# Patient Record
Sex: Female | Born: 1945 | Race: Black or African American | Hispanic: No | Smoking: Never smoker
Health system: Southern US, Community
[De-identification: ages and names within clinical notes are randomized; demographics above are authoritative.]

## PROBLEM LIST (undated history)

## (undated) ENCOUNTER — Ambulatory Visit: Admission: EM | Payer: Medicare Other | Source: Home / Self Care

## (undated) ENCOUNTER — Emergency Department: Discharge status: Absent without leave | Payer: Medicare Other

## (undated) DIAGNOSIS — N950 Postmenopausal bleeding: Secondary | ICD-10-CM

## (undated) DIAGNOSIS — N3281 Overactive bladder: Secondary | ICD-10-CM

## (undated) DIAGNOSIS — E785 Hyperlipidemia, unspecified: Secondary | ICD-10-CM

## (undated) DIAGNOSIS — I639 Cerebral infarction, unspecified: Secondary | ICD-10-CM

## (undated) DIAGNOSIS — E119 Type 2 diabetes mellitus without complications: Secondary | ICD-10-CM

## (undated) DIAGNOSIS — R32 Unspecified urinary incontinence: Secondary | ICD-10-CM

## (undated) DIAGNOSIS — R030 Elevated blood-pressure reading, without diagnosis of hypertension: Secondary | ICD-10-CM

## (undated) DIAGNOSIS — M810 Age-related osteoporosis without current pathological fracture: Secondary | ICD-10-CM

## (undated) HISTORY — DX: Unspecified urinary incontinence: R32

## (undated) HISTORY — DX: Overactive bladder: N32.81

## (undated) HISTORY — DX: Age-related osteoporosis without current pathological fracture: M81.0

## (undated) HISTORY — DX: Hyperlipidemia, unspecified: E78.5

## (undated) HISTORY — DX: Type 2 diabetes mellitus without complications: E11.9

## (undated) HISTORY — DX: Elevated blood-pressure reading, without diagnosis of hypertension: R03.0

---
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
## 2004-08-24 ENCOUNTER — Ambulatory Visit: Payer: Self-pay | Admitting: Family Medicine

## 2005-07-02 ENCOUNTER — Emergency Department: Payer: Self-pay | Admitting: Emergency Medicine

## 2005-09-15 ENCOUNTER — Ambulatory Visit: Payer: Self-pay | Admitting: Psychiatry

## 2006-02-11 ENCOUNTER — Ambulatory Visit: Payer: Self-pay | Admitting: Family Medicine

## 2006-10-25 ENCOUNTER — Emergency Department: Payer: Self-pay | Admitting: Emergency Medicine

## 2007-04-14 ENCOUNTER — Ambulatory Visit: Payer: Self-pay | Admitting: Family Medicine

## 2007-05-23 LAB — HM COLONOSCOPY: HM Colonoscopy: NORMAL

## 2007-05-28 ENCOUNTER — Ambulatory Visit: Payer: Self-pay | Admitting: Gastroenterology

## 2008-04-19 ENCOUNTER — Ambulatory Visit: Payer: Self-pay | Admitting: Family Medicine

## 2008-05-21 HISTORY — PX: COLONOSCOPY: SHX174

## 2008-09-13 ENCOUNTER — Ambulatory Visit: Payer: Self-pay | Admitting: Family Medicine

## 2008-10-06 ENCOUNTER — Ambulatory Visit: Payer: Self-pay | Admitting: Family Medicine

## 2009-05-09 ENCOUNTER — Ambulatory Visit: Payer: Self-pay | Admitting: Family Medicine

## 2009-07-22 ENCOUNTER — Ambulatory Visit: Payer: Self-pay | Admitting: Family Medicine

## 2010-07-04 ENCOUNTER — Ambulatory Visit: Payer: Self-pay | Admitting: Internal Medicine

## 2011-01-17 ENCOUNTER — Ambulatory Visit: Payer: Self-pay | Admitting: Internal Medicine

## 2011-07-10 ENCOUNTER — Ambulatory Visit: Payer: Self-pay | Admitting: Internal Medicine

## 2012-02-06 ENCOUNTER — Ambulatory Visit: Payer: Self-pay | Admitting: Internal Medicine

## 2012-08-12 ENCOUNTER — Ambulatory Visit: Payer: Self-pay | Admitting: Internal Medicine

## 2013-02-28 ENCOUNTER — Ambulatory Visit: Payer: Self-pay | Admitting: Emergency Medicine

## 2013-03-11 DIAGNOSIS — I6990 Unspecified sequelae of unspecified cerebrovascular disease: Secondary | ICD-10-CM | POA: Insufficient documentation

## 2013-03-11 DIAGNOSIS — N3946 Mixed incontinence: Secondary | ICD-10-CM | POA: Insufficient documentation

## 2013-04-02 ENCOUNTER — Encounter: Payer: Self-pay | Admitting: Urology

## 2013-04-20 ENCOUNTER — Encounter: Payer: Self-pay | Admitting: Urology

## 2013-09-18 LAB — LIPID PANEL
Cholesterol: 230 mg/dL — AB (ref 0–200)
HDL: 69 mg/dL (ref 35–70)
LDL Cholesterol: 136 mg/dL
Triglycerides: 126 mg/dL (ref 40–160)

## 2013-09-18 LAB — BASIC METABOLIC PANEL
BUN: 12 mg/dL (ref 4–21)
Creatinine: 0.7 mg/dL (ref <=1.1)

## 2013-10-08 ENCOUNTER — Ambulatory Visit: Payer: Self-pay | Admitting: Internal Medicine

## 2013-10-08 LAB — HM MAMMOGRAPHY: HM Mammogram: NORMAL

## 2014-04-28 ENCOUNTER — Ambulatory Visit: Payer: Self-pay | Admitting: Family Medicine

## 2014-06-07 LAB — CBC AND DIFFERENTIAL: Hemoglobin: 13 g/dL (ref 12.0–16.0)

## 2014-06-07 LAB — TSH: TSH: 3.4 u[IU]/mL (ref <=5.90)

## 2014-12-15 ENCOUNTER — Encounter: Payer: Self-pay | Admitting: Internal Medicine

## 2014-12-15 DIAGNOSIS — R030 Elevated blood-pressure reading, without diagnosis of hypertension: Secondary | ICD-10-CM | POA: Insufficient documentation

## 2014-12-15 DIAGNOSIS — J302 Other seasonal allergic rhinitis: Secondary | ICD-10-CM | POA: Insufficient documentation

## 2014-12-15 DIAGNOSIS — I69354 Hemiplegia and hemiparesis following cerebral infarction affecting left non-dominant side: Secondary | ICD-10-CM | POA: Insufficient documentation

## 2014-12-15 DIAGNOSIS — M19019 Primary osteoarthritis, unspecified shoulder: Secondary | ICD-10-CM | POA: Insufficient documentation

## 2014-12-15 DIAGNOSIS — R32 Unspecified urinary incontinence: Secondary | ICD-10-CM | POA: Insufficient documentation

## 2014-12-15 DIAGNOSIS — G4733 Obstructive sleep apnea (adult) (pediatric): Secondary | ICD-10-CM | POA: Insufficient documentation

## 2014-12-15 DIAGNOSIS — M810 Age-related osteoporosis without current pathological fracture: Secondary | ICD-10-CM | POA: Insufficient documentation

## 2014-12-15 DIAGNOSIS — R198 Other specified symptoms and signs involving the digestive system and abdomen: Secondary | ICD-10-CM | POA: Insufficient documentation

## 2014-12-15 DIAGNOSIS — R269 Unspecified abnormalities of gait and mobility: Secondary | ICD-10-CM | POA: Insufficient documentation

## 2014-12-15 HISTORY — DX: Unspecified urinary incontinence: R32

## 2014-12-15 HISTORY — DX: Elevated blood-pressure reading, without diagnosis of hypertension: R03.0

## 2014-12-17 ENCOUNTER — Ambulatory Visit (INDEPENDENT_AMBULATORY_CARE_PROVIDER_SITE_OTHER): Payer: Medicare HMO | Admitting: Internal Medicine

## 2014-12-17 ENCOUNTER — Encounter: Payer: Self-pay | Admitting: Internal Medicine

## 2014-12-17 VITALS — BP 142/74 | HR 80 | Ht <= 58 in | Wt 169.2 lb

## 2014-12-17 DIAGNOSIS — J40 Bronchitis, not specified as acute or chronic: Secondary | ICD-10-CM | POA: Diagnosis not present

## 2014-12-17 MED ORDER — AMOXICILLIN-POT CLAVULANATE 875-125 MG PO TABS: 1.0000 | ORAL_TABLET | Freq: Two times a day (BID) | ORAL | Status: DC

## 2014-12-17 NOTE — Patient Instructions (Signed)

## 2014-12-17 NOTE — Progress Notes (Signed)
Date:  12/17/2014   Name:  Sharon York   DOB:  [birth year]   MRN:  [id number]   Chief Complaint: Cough Cough This is a new problem. The current episode started in the past 7 days. The problem has been unchanged. The cough is productive of purulent sputum. Pertinent negatives include no chills, fever, heartburn, hemoptysis, shortness of breath, weight loss or wheezing. Exacerbated by: activity and talking. She has tried OTC cough suppressant for the symptoms. The treatment provided mild relief.     Review of Systems:  Review of Systems  Constitutional: Negative for fever, chills and weight loss.  Respiratory: Positive for cough. Negative for hemoptysis, shortness of breath and wheezing.   Gastrointestinal: Negative for heartburn.    Patient Active Problem List   Diagnosis Date Noted  . Abnormal gait 12/15/2014  . Altered bowel function 12/15/2014  . Blood pressure elevated without history of HTN 12/15/2014  . History of cerebrovascular accident with residual deficit 12/15/2014  . Absence of bladder continence 12/15/2014  . Obstructive sleep apnea of adult 12/15/2014  . Arthritis of shoulder region, degenerative 12/15/2014  . OP (osteoporosis) 12/15/2014  . Allergic rhinitis, seasonal 12/15/2014  . Mixed incontinence 03/11/2013    Prior to Admission medications   Medication Sig Start Date End Date Taking? Authorizing Provider  tiZANidine (ZANAFLEX) 2 MG tablet Take 2 mg by mouth. 03/07/07  Yes Historical Provider, MD  alendronate (FOSAMAX) 70 MG tablet Take 1 tablet by mouth once a week. 05/20/14   Historical Provider, MD  aspirin 81 MG chewable tablet Chew 1 tablet by mouth daily.    Historical Provider, MD  baclofen (LIORESAL) 10 MG tablet Take 1 tablet by mouth 2 (two) times daily. 05/20/14   Historical Provider, MD  Calcium Carbonate-Vitamin D (CALCIUM 500 + D) 500-125 MG-UNIT TABS Take by mouth.    Historical Provider, MD  fluticasone (FLONASE) 50 MCG/ACT nasal spray Place 2  sprays into the nose daily. 03/02/13   Historical Provider, MD  mirabegron ER (MYRBETRIQ) 25 MG TB24 tablet Take 1 tablet by mouth daily.    Historical Provider, MD  Naproxen Sodium (ALEVE) 220 MG CAPS Take 1 capsule by mouth 2 (two) times daily as needed.    Historical Provider, MD  triamcinolone cream (KENALOG) 0.1 % TRIAMCINOLONE ACETONIDE, 0.1% (External Cream)  1 (one) application(s) application(s) two times daily for 30 days  Quantity: 30;  Refills: 1   Ordered :27-Mar-2012  Bari Edward M.D.;  Started 27-Mar-2012 Active Comments: apply bid to rash to reduce itching 03/27/12   Historical Provider, MD    No Known Allergies  No past surgical history on file.  History  Substance Use Topics  . Smoking status: Never Smoker   . Smokeless tobacco: Not on file  . Alcohol Use: No     Medication list has been reviewed and updated.  Physical Examination:  Physical Exam  Constitutional: She appears well-developed and well-nourished. No distress.  Neck: Normal range of motion. Neck supple. No thyromegaly present.  Cardiovascular: Normal rate, regular rhythm and normal heart sounds.   Pulmonary/Chest: Effort normal. No respiratory distress.  Bronchial breast sounds both upper lobes anteriorly  Skin: She is not diaphoretic.  Psychiatric: She has a normal mood and affect.    BP 142/74 mmHg  Pulse 80  Ht  (1.448 m)  Wt 169 lb 3.2 oz (76.749 kg)  BMI 36.60 kg/m2  SpO2 95%  Assessment and Plan: 1. Bronchitis Continue otc cough suppressants and lozenges as needed -  amoxicillin-clavulanate (AUGMENTIN) 875-125 MG per tablet; Take 1 tablet by mouth 2 (two) times daily.  Dispense: 20 tablet; Refill: 0   Bari Edward, MD [facility]  12/17/2014

## 2015-01-05 ENCOUNTER — Ambulatory Visit (INDEPENDENT_AMBULATORY_CARE_PROVIDER_SITE_OTHER): Payer: Medicare Other | Admitting: Family Medicine

## 2015-01-05 ENCOUNTER — Encounter: Payer: Self-pay | Admitting: Family Medicine

## 2015-01-05 VITALS — BP 120/80 | HR 80 | Ht <= 58 in | Wt 175.0 lb

## 2015-01-05 DIAGNOSIS — B372 Candidiasis of skin and nail: Secondary | ICD-10-CM | POA: Diagnosis not present

## 2015-01-05 MED ORDER — FLUCONAZOLE 150 MG PO TABS: 150.0000 mg | ORAL_TABLET | Freq: Once | ORAL | Status: DC

## 2015-01-05 NOTE — Progress Notes (Signed)
Name: Sharon York   MRN: [id number]    DOB: January 13, 1946   Date:01/05/2015       Progress Note  Subjective  Chief Complaint  Chief Complaint  Patient presents with  . Rash    under both breast but worse under the L) breast x 2 weeks- worse in past couple of days- sweating makes it worse    Rash This is a new problem. The current episode started 1 to 4 weeks ago. The problem has been gradually worsening since onset. The affected locations include the chest (beneath breast). The rash is characterized by itchiness, redness, swelling and pain. She was exposed to nothing. Associated symptoms include congestion, coughing and rhinorrhea. Pertinent negatives include no anorexia, diarrhea, eye pain, facial edema, fatigue, fever, joint pain, shortness of breath or sore throat. Past treatments include antibiotics and anti-itch cream. The treatment provided no relief.    No problem-specific assessment & plan notes found for this encounter.   Past Medical History  Diagnosis Date  . Osteoporosis   . Overactive bladder     Past Surgical History  Procedure Laterality Date  . Colonoscopy  2010    normal    Family History  Problem Relation Age of Onset  . Diabetes Mother     Social History   Social History  . Marital Status: Single    Spouse Name: N/A  . Number of Children: N/A  . Years of Education: N/A   Occupational History  . Not on file.   Social History Main Topics  . Smoking status: Never Smoker   . Smokeless tobacco: Not on file  . Alcohol Use: No  . Drug Use: No  . Sexual Activity: No   Other Topics Concern  . Not on file   Social History Narrative    No Known Allergies   Review of Systems  Constitutional: Negative for fever, chills, weight loss, malaise/fatigue and fatigue.  HENT: Positive for congestion and rhinorrhea. Negative for ear discharge, ear pain and sore throat.   Eyes: Negative for blurred vision and pain.  Respiratory: Positive for cough.  Negative for sputum production, shortness of breath and wheezing.   Cardiovascular: Negative for chest pain, palpitations and leg swelling.  Gastrointestinal: Negative for heartburn, nausea, abdominal pain, diarrhea, constipation, blood in stool, melena and anorexia.  Genitourinary: Negative for dysuria, urgency, frequency and hematuria.  Musculoskeletal: Negative for myalgias, back pain, joint pain and neck pain.  Skin: Positive for rash.  Neurological: Negative for dizziness, tingling, sensory change, focal weakness and headaches.  Endo/Heme/Allergies: Negative for environmental allergies and polydipsia. Does not bruise/bleed easily.  Psychiatric/Behavioral: Negative for depression and suicidal ideas. The patient is not nervous/anxious and does not have insomnia.      Objective  Filed Vitals:   01/05/15 1013  BP: 120/80  Pulse: 80  Height: 4\' 10"  (1.473 m)  Weight: 175 lb (79.379 kg)    Physical Exam  Constitutional: She is well-developed, well-nourished, and in no distress. No distress.  HENT:  Head: Normocephalic and atraumatic.  Right Ear: External ear normal.  Left Ear: External ear normal.  Nose: Nose normal.  Mouth/Throat: Oropharynx is clear and moist.  Eyes: Conjunctivae and EOM are normal. Pupils are equal, round, and reactive to light. Right eye exhibits no discharge. Left eye exhibits no discharge.  Neck: Normal range of motion. Neck supple.  Cardiovascular: Normal rate, regular rhythm, normal heart sounds and intact distal pulses.  Exam reveals no gallop and no friction rub.   No  murmur heard. Pulmonary/Chest: Effort normal and breath sounds normal.  Abdominal: Soft. Bowel sounds are normal. She exhibits no mass. There is no tenderness. There is no guarding.  Musculoskeletal: Normal range of motion.  Neurological: She is alert.  Skin: Skin is warm and dry. Rash noted. She is not diaphoretic. There is erythema.  With satellite/ contact  areas / no induration/  fissures      Assessment & Plan  Problem List Items Addressed This Visit    None    Visit Diagnoses    Candidiasis of skin    -  Primary    nystatin cream    Relevant Medications    fluconazole (DIFLUCAN) 150 MG tablet         Dr. Hayden [facility]  01/05/2015

## 2015-01-19 ENCOUNTER — Other Ambulatory Visit: Payer: Self-pay | Admitting: Family Medicine

## 2015-01-27 ENCOUNTER — Other Ambulatory Visit: Payer: Self-pay

## 2015-01-27 ENCOUNTER — Telehealth: Payer: Self-pay

## 2015-01-27 MED ORDER — NYSTATIN 100000 UNIT/GM EX CREA: 1.0000 "application " | TOPICAL_CREAM | Freq: Two times a day (BID) | CUTANEOUS | Status: DC

## 2015-01-27 NOTE — Telephone Encounter (Signed)
Done

## 2015-01-27 NOTE — Telephone Encounter (Signed)
Nystatin cream refilled per pt request

## 2015-01-27 NOTE — Addendum Note (Signed)
Addended by: Schuyler Amor on: 01/27/2015 04:06 PM   Modules accepted: Orders

## 2015-01-30 ENCOUNTER — Ambulatory Visit
Admission: EM | Admit: 2015-01-30 | Discharge: 2015-01-30 | Disposition: A | Discharge status: Home / Self Care | Payer: Medicare Other | Attending: Family Medicine | Admitting: Family Medicine

## 2015-01-30 ENCOUNTER — Encounter: Payer: Self-pay | Admitting: *Deleted

## 2015-01-30 DIAGNOSIS — N390 Urinary tract infection, site not specified: Secondary | ICD-10-CM | POA: Diagnosis not present

## 2015-01-30 HISTORY — DX: Cerebral infarction, unspecified: I63.9

## 2015-01-30 LAB — URINALYSIS COMPLETE WITH MICROSCOPIC (ARMC ONLY)
Bilirubin Urine: NEGATIVE
Glucose, UA: NEGATIVE mg/dL
Ketones, ur: NEGATIVE mg/dL
Nitrite: NEGATIVE
Protein, ur: NEGATIVE mg/dL
Specific Gravity, Urine: 1.025 (ref 1.005–1.030)
pH: 5.5 (ref 5.0–8.0)

## 2015-01-30 MED ORDER — NITROFURANTOIN MONOHYD MACRO 100 MG PO CAPS: 100.0000 mg | ORAL_CAPSULE | Freq: Two times a day (BID) | ORAL | Status: DC

## 2015-01-30 NOTE — Discharge Instructions (Signed)

## 2015-01-30 NOTE — ED Notes (Signed)
Pt states that she is having right sided abdominal pain, started last night

## 2015-02-01 LAB — URINE CULTURE: Special Requests: NORMAL

## 2015-02-01 NOTE — ED Provider Notes (Signed)
CSN: [id number]     Arrival date & time 01/30/15  1432 History   First MD Initiated Contact with Patient 01/30/15 1533     Chief Complaint  Patient presents with  . Abdominal Pain   (Consider location/radiation/quality/duration/timing/severity/associated sxs/prior Treatment) HPI Comments: Single african [place] female here with daughter for evaluation of right lower abdomen pain.  Was started on fiber therapy 2 months ago.  Daily diarrhea since that time  Stroke 1995 has right sided weakness/uses cane.  Colonoscopy with polyps a few years ago.  Rash under left breast using nystatin cream as prescribed by [facility] coworker and rash resolving.  Dizzyness in the mornings recurrent.  Patient is a 69 y.o. female presenting with abdominal pain. The history is provided by the patient and a relative.  Abdominal Pain Pain location:  RLQ Pain quality: aching   Pain quality: not bloating, not burning, not cramping, not dull, no fullness, not gnawing, not heavy, no pressure, not sharp, not shooting, not squeezing, not stabbing, no stiffness, not tearing, not throbbing and not tugging   Pain radiates to:  Does not radiate Pain severity:  Moderate Onset quality:  Sudden Duration:  1 day Timing:  Constant Progression:  Unchanged Chronicity:  New Context: diet changes and laxative use   Context: not alcohol use, not awakening from sleep, not eating, not medication withdrawal, not previous surgeries, not recent illness, not recent sexual activity, not recent travel, not retching, not sick contacts, not suspicious food intake and not trauma   Relieved by:  Nothing Worsened by:  Palpation Ineffective treatments:  Bowel activity, liquids, urination, position changes, movement, lying down, eating and not moving Associated symptoms: diarrhea   Associated symptoms: no anorexia, no belching, no chest pain, no chills, no constipation, no cough, no dysuria, no fatigue, no fever, no flatus, no hematemesis, no  hematochezia, no hematuria, no melena, no nausea, no shortness of breath, no sore throat, no vaginal bleeding, no vaginal discharge and no vomiting   Diarrhea:    Quality:  Watery   Number of occurrences:  Daily   Severity:  Moderate   Timing:  Intermittent   Progression:  Unchanged Risk factors: aspirin, being elderly and obesity   Risk factors: no alcohol abuse, has not had multiple surgeries, no NSAID use and not pregnant     Past Medical History  Diagnosis Date  . Osteoporosis   . Overactive bladder   . Stroke    Past Surgical History  Procedure Laterality Date  . Colonoscopy  2010    normal   Family History  Problem Relation Age of Onset  . Diabetes Mother    Social History  Substance Use Topics  . Smoking status: Never Smoker   . Smokeless tobacco: None  . Alcohol Use: No   OB History    No data available     Review of Systems  Constitutional: Negative for fever, chills, diaphoresis, activity change, appetite change and fatigue.  HENT: Negative for congestion, dental problem, drooling, ear discharge, ear pain, facial swelling, hearing loss, mouth sores, nosebleeds, postnasal drip, sore throat, trouble swallowing and voice change.   Eyes: Negative for photophobia, pain, discharge, redness, itching and visual disturbance.  Respiratory: Negative for cough, choking, chest tightness, shortness of breath, wheezing and stridor.   Cardiovascular: Negative for chest pain and leg swelling.  Gastrointestinal: Positive for abdominal pain and diarrhea. Negative for nausea, vomiting, constipation, blood in stool, melena, hematochezia, abdominal distention, anal bleeding, anorexia, flatus and hematemesis.  Endocrine: Negative for  cold intolerance and heat intolerance.  Genitourinary: Negative for dysuria, urgency, frequency, hematuria, decreased urine volume, vaginal bleeding and vaginal discharge.  Musculoskeletal: Negative for myalgias, back pain, joint swelling, arthralgias,  gait problem, neck pain and neck stiffness.  Skin: Positive for rash. Negative for color change, pallor and wound.  Allergic/Immunologic: Negative for environmental allergies and food allergies.  Neurological: Positive for dizziness. Negative for tremors, seizures, syncope, facial asymmetry, speech difficulty, weakness, light-headedness, numbness and headaches.  Hematological: Negative for adenopathy. Does not bruise/bleed easily.  Psychiatric/Behavioral: Negative for behavioral problems, confusion, sleep disturbance and agitation.    Allergies  Review of patient's allergies indicates no known allergies.  Home Medications   Prior to Admission medications   Medication Sig Start Date End Date Taking? Authorizing Provider  alendronate (FOSAMAX) 70 MG tablet Take 1 tablet by mouth once a week. 05/20/14  Yes Historical Provider, MD  aspirin 81 MG chewable tablet Chew 1 tablet by mouth daily.   Yes Historical Provider, MD  baclofen (LIORESAL) 10 MG tablet Take 1 tablet by mouth 2 (two) times daily. 05/20/14  Yes Historical Provider, MD  Calcium Carbonate-Vitamin D (CALCIUM 500 + D) 500-125 MG-UNIT TABS Take by mouth.   Yes Historical Provider, MD  fluconazole (DIFLUCAN) 150 MG tablet TAKE ONE TABLET BY MOUTH ONCE AS A ONE TIME DOSE 01/19/15  Yes Reubin Milan, MD  mirabegron ER (MYRBETRIQ) 25 MG TB24 tablet Take 1 tablet by mouth daily.   Yes Historical Provider, MD  nystatin cream (MYCOSTATIN) Apply 1 application topically 2 (two) times daily. 01/27/15  Yes Schuyler Amor, MD  nitrofurantoin, macrocrystal-monohydrate, (MACROBID) 100 MG capsule Take 1 capsule (100 mg total) by mouth 2 (two) times daily. 01/30/15   Barbaraann Barthel, NP   Meds Ordered and Administered this Visit  Medications - No data to display  BP 149/78 mmHg  Pulse 81  Temp(Src) 98.6 F (37 C) (Oral)  Ht 4\' 9"  (1.448 m)  Wt 164 lb (74.39 kg)  BMI 35.48 kg/m2  SpO2 97% No data found.   Physical Exam  Constitutional:  She is oriented to person, place, and time. Vital signs are normal. She appears well-developed and well-nourished. No distress.  HENT:  Head: Normocephalic and atraumatic.  Right Ear: External ear normal.  Left Ear: External ear normal.  Nose: Nose normal.  Mouth/Throat: Oropharynx is clear and moist. No oropharyngeal exudate.  Eyes: Conjunctivae, EOM and lids are normal. Pupils are equal, round, and reactive to light. Right eye exhibits no discharge. Left eye exhibits no discharge. No scleral icterus.  Neck: Trachea normal and normal range of motion. Neck supple. No tracheal deviation present.  Cardiovascular: Normal rate, regular rhythm, normal heart sounds and intact distal pulses.  Exam reveals no gallop and no friction rub.   No murmur heard. Pulmonary/Chest: Effort normal and breath sounds normal. No stridor. No respiratory distress. She has no wheezes. She has no rales.  Abdominal: Soft. Bowel sounds are normal. She exhibits no shifting dullness, no distension, no pulsatile liver, no fluid wave, no abdominal bruit, no ascites, no pulsatile midline mass and no mass. There is no hepatosplenomegaly. There is no tenderness. There is no rigidity, no rebound, no guarding, no CVA tenderness, no tenderness at McBurney's point and negative Murphy's sign. Hernia confirmed negative in the ventral area.  tympanny to percussion LUQ/RUQ and dull to percussion LLQ/RLQ  Musculoskeletal: Normal range of motion. She exhibits no edema or tenderness.  Left arm held across chest; c/o left leg weakness (chronic)uses  cane right hand and assist by daughter and provider to get on/off exam table  Lymphadenopathy:    She has no cervical adenopathy.  Neurological: She is alert and oriented to person, place, and time. She exhibits abnormal muscle tone. Coordination abnormal.  Skin: Skin is warm, dry and intact. She is not diaphoretic. No pallor.  Psychiatric: She has a normal mood and affect. Her speech is normal and  behavior is normal. Judgment and thought content normal. Cognition and memory are normal.  Nursing note and vitals reviewed.   ED Course  Procedures (including critical care time)  Labs Review Labs Reviewed  URINALYSIS COMPLETEWITH MICROSCOPIC ([facility] ONLY) - Abnormal; Notable for the following:    APPearance HAZY (*)    Hgb urine dipstick TRACE (*)    Leukocytes, UA 1+ (*)    Squamous Epithelial / LPF 6-30 (*)    All other components within normal limits  URINE CULTURE    Imaging Review No results found.   Discussed urinalysis results with patient and daughter.  Will call with culture results when available typically 48 hours. Patient given copy of urinalysis results.  Patient and daughter verbalized understanding of information/instructions, agreed with plan of care and had no further questions at this time.   MDM   1. UTI (lower urinary tract infection)    Medications as directed. macrobid  po BID    Patient is also to push fluids and may use Pyridium  po TID as needed.  Hydrate, avoid dehydration.  Avoid holding urine void on frequent basis every 4 to 6 hours.  If unable to void every 8 hours follow up for re-evaluation with PCM, urgent care or ER.   Call or return to clinic as needed if these symptoms worsen or fail to improve as anticipated.  Exitcare handout on cystitis given to patient Patient verbalized agreement and understanding of treatment plan and had no further questions at this time. P2:  Hydrate and cranberry juice  Diarrhea discussed titrating fiber supplement to have soft brown stool daily.  Too much fiber can cause diarrhea.  If still having diarrhea follow up with [facility] for re-evaluation.  Patient and daughter verbalized understanding of information/instructions, agreed with plan of care and had no further questions at this time.  01 Feb 2015 at 1218 telephone message left for patient to contact clinic to verify if symptoms resolved.  Urine culture with  multiple species.    Barbaraann Barthel, NP 02/01/15 1227  Barbaraann Barthel, NP 02/01/15 1229

## 2015-02-03 ENCOUNTER — Encounter: Payer: Self-pay | Admitting: Emergency Medicine

## 2015-02-03 ENCOUNTER — Ambulatory Visit
Admission: EM | Admit: 2015-02-03 | Discharge: 2015-02-03 | Disposition: A | Discharge status: Other Acute Inpatient Hospital | Payer: Medicare Other | Attending: Family Medicine | Admitting: Family Medicine

## 2015-02-03 ENCOUNTER — Emergency Department
Admission: EM | Admit: 2015-02-03 | Discharge: 2015-02-03 | Disposition: A | Discharge status: Home / Self Care | Payer: Medicare Other | Attending: Emergency Medicine | Admitting: Emergency Medicine

## 2015-02-03 ENCOUNTER — Emergency Department: Payer: Medicare Other

## 2015-02-03 DIAGNOSIS — N12 Tubulo-interstitial nephritis, not specified as acute or chronic: Secondary | ICD-10-CM | POA: Diagnosis not present

## 2015-02-03 DIAGNOSIS — R51 Headache: Secondary | ICD-10-CM

## 2015-02-03 DIAGNOSIS — R519 Headache, unspecified: Secondary | ICD-10-CM

## 2015-02-03 DIAGNOSIS — Z79899 Other long term (current) drug therapy: Secondary | ICD-10-CM | POA: Insufficient documentation

## 2015-02-03 DIAGNOSIS — Z7982 Long term (current) use of aspirin: Secondary | ICD-10-CM | POA: Insufficient documentation

## 2015-02-03 DIAGNOSIS — G44019 Episodic cluster headache, not intractable: Secondary | ICD-10-CM | POA: Insufficient documentation

## 2015-02-03 DIAGNOSIS — M6281 Muscle weakness (generalized): Secondary | ICD-10-CM | POA: Insufficient documentation

## 2015-02-03 DIAGNOSIS — R531 Weakness: Secondary | ICD-10-CM | POA: Diagnosis not present

## 2015-02-03 LAB — CBC WITH DIFFERENTIAL/PLATELET
Basophils Absolute: 0 10*3/uL (ref 0–0.1)
Basophils Relative: 0 %
Eosinophils Absolute: 0.1 10*3/uL (ref 0–0.7)
Eosinophils Relative: 2 %
HCT: 40 % (ref 35.0–47.0)
Hemoglobin: 12.9 g/dL (ref 12.0–16.0)
Lymphocytes Relative: 34 %
Lymphs Abs: 2.4 10*3/uL (ref 1.0–3.6)
MCH: 22.9 pg — ABNORMAL LOW (ref 26.0–34.0)
MCHC: 32.2 g/dL (ref 32.0–36.0)
MCV: 71.1 fL — ABNORMAL LOW (ref 80.0–100.0)
Monocytes Absolute: 0.8 10*3/uL (ref 0.2–0.9)
Monocytes Relative: 11 %
Neutro Abs: 3.8 10*3/uL (ref 1.4–6.5)
Neutrophils Relative %: 53 %
Platelets: 233 10*3/uL (ref 150–440)
RBC: 5.63 MIL/uL — ABNORMAL HIGH (ref 3.80–5.20)
RDW: 15.6 % — ABNORMAL HIGH (ref 11.5–14.5)
WBC: 7 10*3/uL (ref 3.6–11.0)

## 2015-02-03 LAB — URINALYSIS COMPLETE WITH MICROSCOPIC (ARMC ONLY)
Bacteria, UA: NONE SEEN
Bilirubin Urine: NEGATIVE
Bilirubin Urine: NEGATIVE
Glucose, UA: NEGATIVE mg/dL
Glucose, UA: NEGATIVE mg/dL
Ketones, ur: NEGATIVE mg/dL
Ketones, ur: NEGATIVE mg/dL
Leukocytes, UA: NEGATIVE
Nitrite: NEGATIVE
Nitrite: NEGATIVE
Protein, ur: NEGATIVE mg/dL
Protein, ur: NEGATIVE mg/dL
Specific Gravity, Urine: 1.02 (ref 1.005–1.030)
Specific Gravity, Urine: 1.008 (ref 1.005–1.030)
pH: 7 (ref 5.0–8.0)
pH: 7 (ref 5.0–8.0)

## 2015-02-03 LAB — TROPONIN I: Troponin I: <0.03 ng/mL (ref <0.031)

## 2015-02-03 LAB — COMPREHENSIVE METABOLIC PANEL
ALT: 20 U/L (ref 14–54)
AST: 24 U/L (ref 15–41)
Albumin: 4.1 g/dL (ref 3.5–5.0)
Alkaline Phosphatase: 43 U/L (ref 38–126)
Anion gap: 11 (ref 5–15)
BUN: 14 mg/dL (ref 6–20)
CO2: 28 mmol/L (ref 22–32)
Calcium: 9.7 mg/dL (ref 8.9–10.3)
Chloride: 100 mmol/L — ABNORMAL LOW (ref 101–111)
Creatinine, Ser: 0.63 mg/dL (ref 0.44–1.00)
GFR calc Af Amer: >60 mL/min (ref >60)
GFR calc non Af Amer: >60 mL/min (ref >60)
Glucose, Bld: 109 mg/dL — ABNORMAL HIGH (ref 65–99)
Potassium: 3.9 mmol/L (ref 3.5–5.1)
Sodium: 139 mmol/L (ref 135–145)
Total Bilirubin: 0.4 mg/dL (ref 0.3–1.2)
Total Protein: 8 g/dL (ref 6.5–8.1)

## 2015-02-03 NOTE — ED Notes (Signed)
Patient transported to CT 

## 2015-02-03 NOTE — Discharge Instructions (Signed)
As discussed go directly to the ER of your choice. Your daughter is to drive you. Follow-up closely with your primary care physician within one week. Return to the urgent care as needed.  Headaches, Frequently Asked Questions MIGRAINE HEADACHES Q: What is migraine? What causes it? How can I treat it? A: Generally, migraine headaches begin as a dull ache. Then they develop into a constant, throbbing, and pulsating pain. You may experience pain at the temples. You may experience pain at the front or back of one or both sides of the head. The pain is usually accompanied by a combination of:  Nausea.  Vomiting.  Sensitivity to light and noise. Some people (about 15%) experience an aura (see below) before an attack. The cause of migraine is believed to be chemical reactions in the brain. Treatment for migraine may include over-the-counter or prescription medications. It may also include self-help techniques. These include relaxation training and biofeedback.  Q: What is an aura? A: About 15% of people with migraine get an "aura". This is a sign of neurological symptoms that occur before a migraine headache. You may see wavy or jagged lines, dots, or flashing lights. You might experience tunnel vision or blind spots in one or both eyes. The aura can include visual or auditory hallucinations (something imagined). It may include disruptions in smell (such as strange odors), taste or touch. Other symptoms include:  Numbness.  A "pins and needles" sensation.  Difficulty in recalling or speaking the correct word. These neurological events may last as long as 60 minutes. These symptoms will fade as the headache begins. Q: What is a trigger? A: Certain physical or environmental factors can lead to or "trigger" a migraine. These include:  Foods.  Hormonal changes.  Weather.  Stress. It is important to remember that triggers are different for everyone. To help prevent migraine attacks, you need to  figure out which triggers affect you. Keep a headache diary. This is a good way to track triggers. The diary will help you talk to your healthcare professional about your condition. Q: Does weather affect migraines? A: Bright sunshine, hot, humid conditions, and drastic changes in barometric pressure may lead to, or "trigger," a migraine attack in some people. But studies have shown that weather does not act as a trigger for everyone with migraines. Q: What is the link between migraine and hormones? A: Hormones start and regulate many of your body's functions. Hormones keep your body in balance within a constantly changing environment. The levels of hormones in your body are unbalanced at times. Examples are during menstruation, pregnancy, or menopause. That can lead to a migraine attack. In fact, about three quarters of all women with migraine report that their attacks are related to the menstrual cycle.  Q: Is there an increased risk of stroke for migraine sufferers? A: The likelihood of a migraine attack causing a stroke is very remote. That is not to say that migraine sufferers cannot have a stroke associated with their migraines. In persons under age 29, the most common associated factor for stroke is migraine headache. But over the course of a person's normal life span, the occurrence of migraine headache may actually be associated with a reduced risk of dying from cerebrovascular disease due to stroke.  Q: What are acute medications for migraine? A: Acute medications are used to treat the pain of the headache after it has started. Examples over-the-counter medications, NSAIDs, ergots, and triptans.  Q: What are the triptans? A: Triptans are  the newest class of abortive medications. They are specifically targeted to treat migraine. Triptans are vasoconstrictors. They moderate some chemical reactions in the brain. The triptans work on receptors in your brain. Triptans help to restore the balance of a  neurotransmitter called serotonin. Fluctuations in levels of serotonin are thought to be a main cause of migraine.  Q: Are over-the-counter medications for migraine effective? A: Over-the-counter, or "OTC," medications may be effective in relieving mild to moderate pain and associated symptoms of migraine. But you should see your caregiver before beginning any treatment regimen for migraine.  Q: What are preventive medications for migraine? A: Preventive medications for migraine are sometimes referred to as "prophylactic" treatments. They are used to reduce the frequency, severity, and length of migraine attacks. Examples of preventive medications include antiepileptic medications, antidepressants, beta-blockers, calcium channel blockers, and NSAIDs (nonsteroidal anti-inflammatory drugs). Q: Why are anticonvulsants used to treat migraine? A: During the past few years, there has been an increased interest in antiepileptic drugs for the prevention of migraine. They are sometimes referred to as "anticonvulsants". Both epilepsy and migraine may be caused by similar reactions in the brain.  Q: Why are antidepressants used to treat migraine? A: Antidepressants are typically used to treat people with depression. They may reduce migraine frequency by regulating chemical levels, such as serotonin, in the brain.  Q: What alternative therapies are used to treat migraine? A: The term "alternative therapies" is often used to describe treatments considered outside the scope of conventional Western medicine. Examples of alternative therapy include acupuncture, acupressure, and yoga. Another common alternative treatment is herbal therapy. Some herbs are believed to relieve headache pain. Always discuss alternative therapies with your caregiver before proceeding. Some herbal products contain arsenic and other toxins. TENSION HEADACHES Q: What is a tension-type headache? What causes it? How can I treat it? A:  Tension-type headaches occur randomly. They are often the result of temporary stress, anxiety, fatigue, or anger. Symptoms include soreness in your temples, a tightening band-like sensation around your head (a "vice-like" ache). Symptoms can also include a pulling feeling, pressure sensations, and contracting head and neck muscles. The headache begins in your forehead, temples, or the back of your head and neck. Treatment for tension-type headache may include over-the-counter or prescription medications. Treatment may also include self-help techniques such as relaxation training and biofeedback. CLUSTER HEADACHES Q: What is a cluster headache? What causes it? How can I treat it? A: Cluster headache gets its name because the attacks come in groups. The pain arrives with little, if any, warning. It is usually on one side of the head. A tearing or bloodshot eye and a runny nose on the same side of the headache may also accompany the pain. Cluster headaches are believed to be caused by chemical reactions in the brain. They have been described as the most severe and intense of any headache type. Treatment for cluster headache includes prescription medication and oxygen. SINUS HEADACHES Q: What is a sinus headache? What causes it? How can I treat it? A: When a cavity in the bones of the face and skull (a sinus) becomes inflamed, the inflammation will cause localized pain. This condition is usually the result of an allergic reaction, a tumor, or an infection. If your headache is caused by a sinus blockage, such as an infection, you will probably have a fever. An x-ray will confirm a sinus blockage. Your caregiver's treatment might include antibiotics for the infection, as well as antihistamines or decongestants.  REBOUND  HEADACHES Q: What is a rebound headache? What causes it? How can I treat it? A: A pattern of taking acute headache medications too often can lead to a condition known as "rebound headache." A  pattern of taking too much headache medication includes taking it more than 2 days per week or in excessive amounts. That means more than the label or a caregiver advises. With rebound headaches, your medications not only stop relieving pain, they actually begin to cause headaches. Doctors treat rebound headache by tapering the medication that is being overused. Sometimes your caregiver will gradually substitute a different type of treatment or medication. Stopping may be a challenge. Regularly overusing a medication increases the potential for serious side effects. Consult a caregiver if you regularly use headache medications more than 2 days per week or more than the label advises. ADDITIONAL QUESTIONS AND ANSWERS Q: What is biofeedback? A: Biofeedback is a self-help treatment. Biofeedback uses special equipment to monitor your body's involuntary physical responses. Biofeedback monitors:  Breathing.  Pulse.  Heart rate.  Temperature.  Muscle tension.  Brain activity. Biofeedback helps you refine and perfect your relaxation exercises. You learn to control the physical responses that are related to stress. Once the technique has been mastered, you do not need the equipment any more. Q: Are headaches hereditary? A: Four out of five (80%) of people that suffer report a family history of migraine. Scientists are not sure if this is genetic or a family predisposition. Despite the uncertainty, a child has a 50% chance of having migraine if one parent suffers. The child has a 75% chance if both parents suffer.  Q: Can children get headaches? A: By the time they reach high school, most young people have experienced some type of headache. Many safe and effective approaches or medications can prevent a headache from occurring or stop it after it has begun.  Q: What type of doctor should I see to diagnose and treat my headache? A: Start with your primary caregiver. Discuss his or her experience and  approach to headaches. Discuss methods of classification, diagnosis, and treatment. Your caregiver may decide to recommend you to a headache specialist, depending upon your symptoms or other physical conditions. Having diabetes, allergies, etc., may require a more comprehensive and inclusive approach to your headache. The National Headache Foundation will provide, upon request, a list of [facility] physician members in your state. Document Released: 07/28/2003 Document Revised: 07/30/2011 Document Reviewed: 01/05/2008 [facility] Patient Information 2015 [place], [place]. This information is not intended to replace advice given to you by your health care provider. Make sure you discuss any questions you have with your health care provider.  Weakness Weakness is a lack of strength. You may feel weak all over your body or just in one part of your body. Weakness can be serious. In some cases, you may need more medical tests. HOME CARE  Rest.  Eat a well-balanced diet.  Try to exercise every day.  Only take medicines as told by your doctor. GET HELP RIGHT AWAY IF:   You cannot do your normal daily activities.  You cannot walk up and down stairs, or you feel very tired when you do so.  You have shortness of breath or chest pain.  You have trouble moving parts of your body.  You have weakness in only one body part or on only one side of the body.  You have a fever.  You have trouble speaking or swallowing.  You cannot control when you  pee (urinate) or poop (bowel movement).  You have black or bloody throw up (vomit) or poop.  Your weakness gets worse or spreads to other body parts.  You have new aches or pains. MAKE SURE YOU:   Understand these instructions.  Will watch your condition.  Will get help right away if you are not doing well or get worse. Document Released: 04/19/2008 Document Revised: 11/06/2011 Document Reviewed: 07/06/2011 [facility] Patient Information 2015 [place], [place].  This information is not intended to replace advice given to you by your health care provider. Make sure you discuss any questions you have with your health care provider.

## 2015-02-03 NOTE — ED Notes (Signed)
Attempted to in and out catheterize pt, with no urine return. MD notified, pt given water, will reassess.

## 2015-02-03 NOTE — Discharge Instructions (Signed)
Please complete the entire course of antibiotics, even if you are feeling better.  Please have your primary care physician follow up the results of your urine culture today.  Drink plenty of fluid and get plenty of rest.  Return to the emergency department if you develop severe pain, headache, fever, new numbness, tingling or weakness, or for any other symptoms concerning to you.

## 2015-02-03 NOTE — ED Provider Notes (Signed)
[facility] Emergency Department Provider Note  ____________________________________________  Time seen: Approximately 1150 PM  I have reviewed the triage vital signs and the nursing notes.   HISTORY  Chief Complaint Headache   HPI Sharon York is a 69 y.o. female presents with a complaint of intermittent headache. States that these intermittent headaches have been present 3 days. States they are usually in the morning. Denies fall or injury. Denies current headache now.Denies known triggers of headache. Reports continues to eat and drink well. Report ate breakfast this am.   Reports when headaches are present they are at 6 out of 10 and "pain all over the back of my head". Denies vision changes, dizziness, neck or back pain. Denies fevers. Denies chest pain, shortness of breath, abdominal pain, dysuria, or weakness. Reports occasional right side pain that is mostly with movement and has been present x several days. Also reports some intermittent right knee weakness feeling. Denies generalized right leg weakness, but states only in knee over last several days. Denies knee pain.   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 Denies changes in deficits.   Patient reports seen in Urgent care 2 days ago and diagnosed with UTI. Reports started on macrobid. But reports headaches present prior to starting medication.    Past Medical History  Diagnosis Date  . Osteoporosis   . Overactive bladder   . Stroke     Patient Active Problem List   Diagnosis Date Noted  . Abnormal gait 12/15/2014  . Altered bowel function 12/15/2014  . Blood pressure elevated without history of HTN 12/15/2014  . History of cerebrovascular accident with residual deficit 12/15/2014  . Absence of bladder continence 12/15/2014  . Obstructive sleep apnea of adult 12/15/2014  . Arthritis of shoulder region, degenerative 12/15/2014  . OP (osteoporosis) 12/15/2014  .  Allergic rhinitis, seasonal 12/15/2014  . Mixed incontinence 03/11/2013    Past Surgical History  Procedure Laterality Date  . Colonoscopy  2010    normal    Current Outpatient Rx  Name  Route  Sig  Dispense  Refill  . alendronate (FOSAMAX) 70 MG tablet   Oral   Take 1 tablet by mouth once a week.         Marland Kitchen aspirin 81 MG chewable tablet   Oral   Chew 1 tablet by mouth daily.         . baclofen (LIORESAL) 10 MG tablet   Oral   Take 1 tablet by mouth 2 (two) times daily.         . Calcium Carbonate-Vitamin D (CALCIUM 500 + D) 500-125 MG-UNIT TABS   Oral   Take by mouth.         . fluconazole (DIFLUCAN) 150 MG tablet      TAKE ONE TABLET BY MOUTH ONCE AS A ONE TIME DOSE   1 tablet   0   . mirabegron ER (MYRBETRIQ) 25 MG TB24 tablet   Oral   Take 1 tablet by mouth daily.         . nitrofurantoin, macrocrystal-monohydrate, (MACROBID) 100 MG capsule   Oral   Take 1 capsule (100 mg total) by mouth 2 (two) times daily.   14 capsule   0   . nystatin cream (MYCOSTATIN)   Topical   Apply 1 application topically 2 (two) times daily.   30 g   2     Allergies Review of patient's allergies indicates no known allergies.  Family History  Problem Relation Age of Onset  . Diabetes Mother     Social History Social History  Substance Use Topics  . Smoking status: Never Smoker   . Smokeless tobacco: None  . Alcohol Use: No    Review of Systems Constitutional: No fever/chills Eyes: No visual changes. ENT: No sore throat. Cardiovascular: Denies chest pain. Respiratory: Denies shortness of breath. Gastrointestinal: No abdominal pain.  No nausea, no vomiting.  No diarrhea.  No constipation. Genitourinary: Negative for dysuria. Musculoskeletal: Negative for back pain. Skin: Negative for rash. Neurological: positive for headaches. Denies numbness or loss of sensation.   10-point ROS otherwise  negative.  ____________________________________________   PHYSICAL EXAM:  VITAL SIGNS: ED Triage Vitals  Enc Vitals Group     BP 02/03/15 1100 158/72 mmHg     Pulse Rate 02/03/15 1100 78     Resp 02/03/15 1100 16     Temp 02/03/15 1100 96.2 F (35.7 C)     Temp Source 02/03/15 1100 Tympanic     SpO2 02/03/15 1100 98 %     Weight 02/03/15 1100 164 lb (74.39 kg)     Height 02/03/15 1100 4\' 11"  (1.499 m)     Head Cir --      Peak Flow --      Pain Score --      Pain Loc --      Pain Edu? --      Excl. in GC? --     Constitutional: Alert and oriented. Well appearing and in no acute distress. Eyes: Conjunctivae are normal. PERRL. EOMI. Head: Atraumatic. Nontender to palpation.    Nose: No congestion/rhinnorhea.  Mouth/Throat: Mucous membranes are moist.  Oropharynx non-erythematous.  Neck: No stridor.  No cervical spine tenderness to palpation. Hematological/Lymphatic/Immunilogical: No cervical lymphadenopathy. Cardiovascular: Normal rate, regular rhythm. Grossly normal heart sounds.  Good peripheral circulation. Respiratory: Normal respiratory effort.  No retractions. Lungs CTAB. Gastrointestinal: Soft and nontender. No distention. Normal Bowel sounds.  No abdominal bruits. No CVA tenderness. Musculoskeletal: No lower or upper extremity tenderness nor edema.  No joint effusions. Bilateral pedal pulses equal and easily palpated. No cervical, thoracic or lumbar TTP.  Neurologic:  Normal speech and language. Left arm flexed at elbow and held closely to body, unable to full extend elbow and unable to fully abduct left shoulder, weakened left hand grip. Decreased sensation to left face, left arm and left leg.4/5 strength to left leg.  Slight ataxic gait. Slight left facial droop. Per patient these findings are chronic and residual from prior CVA.  No noted right face right arm or right leg weakness or sensation deficits. Ambulatory with cane. No right pronator drift. 5/5 strength to  right arm and right leg.  Skin:  Skin is warm, dry and intact. No rash noted. Psychiatric: Mood and affect are normal. Speech and behavior are normal.  ____________________________________________   LABS (all labs ordered are listed, but only abnormal results are displayed)  Labs Reviewed  CBC WITH DIFFERENTIAL/PLATELET - Abnormal; Notable for the following:    RBC 5.63 (*)    MCV 71.1 (*)    MCH 22.9 (*)    RDW 15.6 (*)    All other components within normal limits  COMPREHENSIVE METABOLIC PANEL - Abnormal; Notable for the following:    Chloride 100 (*)    Glucose, Bld 109 (*)    All other components within normal limits  URINALYSIS COMPLETEWITH MICROSCOPIC ([facility] ONLY) - Abnormal; Notable for the following:  APPearance HAZY (*)    Hgb urine dipstick TRACE (*)    Leukocytes, UA 1+ (*)    Squamous Epithelial / LPF 0-5 (*)    All other components within normal limits  URINE CULTURE   ____________________________________________  EKG  Interpreted by Dr Thurmond Butts.  66bpm sinus rhythm with first degree AV block   Unable to find recent EKG for comparison. EKG from 08/30/1998 NORMAL SINUS RHYTHM WITH PROLONGED PR INTERVAL LEFT ANTERIOR FASCICULAR BLOCK.   Patient denies known history of abnormal EKG.   INITIAL IMPRESSION / ASSESSMENT AND PLAN / ED COURSE  Pertinent labs & imaging results that were available during my care of the patient were reviewed by me and considered in my medical decision making (see chart for details).  Patient reports that she drove herself to the urgent care today for evaluation of intermittent headaches. Patient reports that headaches have been present for the last 3 days. States that they have present upon awakening in the morning and throughout the morning. States that this is atypical for her she normally does not have headaches. Patient reports that the pain is usually in the back of her head and "all over". States that the pain tends to last for a few  seconds to a few minutes, goes away, and then may return. States pain is better when she gets up and going per patient. states that she is unsure if this is because she "just forgets about the headache".   1225: Patient daughter now at bedside. Patient daughter also reports noticing that her mother has seeming to be weak at home. States that she appears overall weak especially when walking over the last 2-3 days stating same timeframe that she has had these headaches. Daughter also reports that she has seemed more distracted than normal and gives the example that she would talk to her mother and her mother doesn't respond promptly as she normally would. Daughter states that her mother just does not seem right. Now with daughter at bedside patient also disclosed discloses that she feels weak and states that is not normal for her. Patient also states that she has been having some intermittent weakness to her right lower leg  and states that this feels like it's more coming from her knee.Patient now states she also is "just weak all over" and "does not feel right."  Patient denies fall or head injury. Denies fever. Denies chest pain, shortness of breath, abdominal pain, palpitations. Patient does report some intermittent right lower back pain as above. Discussed patient and plan of care with Dr Thurmond Butts.   As patient with report of atypical headaches and now with report of accompanying weakness during same time frame, discussed with patient and daughter will refer to ER for choice for further evaluation including CT of head, as unable to CT at this facility at this time. Also discussed, need for evaluation of weakness including cardiac evaluation. Recently seen and diagnosed with UTI 2 days ago, culture and urinalysis reviewed. Culture showing multiple species present and suggested recollection. Urine today very similar to urine 2 days ago, concerned for possible unclean sample.   1235: Patient and her daughter  discussed and prefers to go to [facility] ER for further evaluation. Patient and daughter reports wants to transport via their vehicle. Alert and oriented with decisional capacity and patient plans to have daughter transport her to the Emergency Room and verbalizes risks and benefits of private transport. Discussed follow up and return parameters including no resolution  or any worsening concerns. Patient verbalized understanding and agreed to plan.     ____________________________________________   FINAL CLINICAL IMPRESSION(S) / ED DIAGNOSES  Final diagnoses:  Nonintractable headache, unspecified chronicity pattern, unspecified headache type  Weakness       Renford Dills, NP 02/03/15 1413

## 2015-02-03 NOTE — ED Notes (Addendum)
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 Per notes from [facility] pt c/o HA, denies pain in the ED at this time.

## 2015-02-03 NOTE — ED Notes (Signed)
Patient c/o headache that starts in the morning when she wakes up for the past 2 days.

## 2015-02-03 NOTE — ED Provider Notes (Addendum)
[facility] Emergency Department Provider Note  ____________________________________________  Time seen: Approximately 5:44 PM  I have reviewed the triage vital signs and the nursing notes.   HISTORY  Chief Complaint Weakness    HPI Sharon York is a 69 y.o. 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 Patient reports that 5 days ago she was seen and initiated on antibiotics for UTI. Today she had increased weakness and felt like it was difficult to get out of bed. In addition, she describes pain on the posterior aspect of the right knee, which sometimes makes her right lower extremity feel weak. She has also had mild intermittent left-sided dull headache, with associated nausea but no vomiting. No fevers, chills. No abdominal pain. She did have a right flank pain which has improved since the initiation of antibiotics.   Past Medical History  Diagnosis Date  . Osteoporosis   . Overactive bladder   . Stroke     Patient Active Problem List   Diagnosis Date Noted  . Abnormal gait 12/15/2014  . Altered bowel function 12/15/2014  . Blood pressure elevated without history of HTN 12/15/2014  . History of cerebrovascular accident with residual deficit 12/15/2014  . Absence of bladder continence 12/15/2014  . Obstructive sleep apnea of adult 12/15/2014  . Arthritis of shoulder region, degenerative 12/15/2014  . OP (osteoporosis) 12/15/2014  . Allergic rhinitis, seasonal 12/15/2014  . Mixed incontinence 03/11/2013    Past Surgical History  Procedure Laterality Date  . Colonoscopy  2010    normal    Current Outpatient Rx  Name  Route  Sig  Dispense  Refill  . alendronate (FOSAMAX) 70 MG tablet   Oral   Take 1 tablet by mouth once a week.         Marland Kitchen aspirin 81 MG chewable tablet    Oral   Chew 1 tablet by mouth daily.         . baclofen (LIORESAL) 10 MG tablet   Oral   Take 1 tablet by mouth 2 (two) times daily.         . Calcium Carbonate-Vitamin D (CALCIUM 500 + D) 500-125 MG-UNIT TABS   Oral   Take by mouth.         . Methylcellulose, Laxative, (FIBER THERAPY PO)   Oral   Take 2 capsules by mouth daily.         . mirabegron ER (MYRBETRIQ) 25 MG TB24 tablet   Oral   Take 1 tablet by mouth daily.         . nitrofurantoin, macrocrystal-monohydrate, (MACROBID) 100 MG capsule   Oral   Take 1 capsule (100 mg total) by mouth 2 (two) times daily.   14 capsule   0   . nystatin cream (MYCOSTATIN)   Topical   Apply 1 application topically 2 (two) times daily.   30 g   2     Allergies Review of patient's allergies indicates no known allergies.  Family History  Problem Relation Age of Onset  . Diabetes Mother     Social History Social History  Substance Use Topics  . Smoking status: Never Smoker   . Smokeless tobacco: None  . Alcohol Use: No    Review of Systems Constitutional: No fever/chills Eyes: No visual changes. ENT: No sore throat. Cardiovascular: Denies chest pain, palpitations. Respiratory: Denies  shortness of breath.  No cough. Gastrointestinal: No abdominal pain.  No nausea, no vomiting.  No diarrhea.  No constipation. Improving right flank pain. Genitourinary: Negative for dysuria. Musculoskeletal: Negative for back pain. Skin: Negative for rash. Neurological: Mild left-sided headache. No numbness or tingling. No changes in vision or speech.  10-point ROS otherwise negative.  ____________________________________________   PHYSICAL EXAM:  VITAL SIGNS: ED Triage Vitals  Enc Vitals Group     BP 02/03/15 1343 156/72 mmHg     Pulse Rate 02/03/15 1343 74     Resp 02/03/15 1343 17     Temp 02/03/15 1343 97.5 F (36.4 C)     Temp Source 02/03/15 1343 Oral     SpO2 02/03/15 1343 96 %     Weight 02/03/15 1343 164  lb (74.39 kg)     Height 02/03/15 1343 4\' 9"  (1.448 m)     Head Cir --      Peak Flow --      Pain Score 02/03/15 1344 0     Pain Loc --      Pain Edu? --      Excl. in GC? --     Constitutional: Alert and oriented. Well appearing and in no acute distress. Answers questions appropriately. Eyes: Conjunctivae are normal.  EOMI. Head: Atraumatic. Nose: No congestion/rhinnorhea. Mouth/Throat: Mucous membranes are moist.  Neck: No stridor.  Supple.   Cardiovascular: Normal rate, regular rhythm. No murmurs, rubs or gallops.  Respiratory: Normal respiratory effort.  No retractions. Lungs CTAB.  No wheezes, rales or ronchi. Gastrointestinal: Obese. Soft and nontender. No distention. No peritoneal signs. Musculoskeletal: No LE edema. No palpable cords, no reproducible pain behind the right knee.  Normal right DP and PT pulse. Cap refill is less than 2 seconds.  Neurologic:  Alert and oriented 3, speech is clear. Left facial droop. Extraocular movements are intact. Pupils are symmetric, with left pupil slightly sluggish. Left almost complete hemiparesis of the upper extremity, 4/5 motor strength in the left hip flexor, plantar flexion and dorsiflexion. 5 out of 5 right hip flexion, plantar flexion and dorsiflexion. 5 out of 5 right grip strength biceps and triceps. No pronator drift on the right. Skin:  Skin is warm, dry and intact. No rash noted. Psychiatric: Mood and affect are normal. Speech and behavior are normal.  Normal judgement.  ____________________________________________   LABS (all labs ordered are listed, but only abnormal results are displayed)  Labs Reviewed  URINALYSIS COMPLETEWITH MICROSCOPIC ([facility] ONLY) - Abnormal; Notable for the following:    Color, Urine STRAW (*)    APPearance CLEAR (*)    Hgb urine dipstick 1+ (*)    Squamous Epithelial / LPF 0-5 (*)    All other components within normal limits  URINE CULTURE  TROPONIN I    ____________________________________________  EKG  ED ECG REPORT I, Rockne Menghini, the attending physician, personally viewed and interpreted this ECG.   Date: 02/03/2015    Rate: **66*  Rhythm: normal EKG, normal sinus rhythm, unchanged from previous tracings  Axis: Leftward axis  Intervals:first-degree A-V block   ST&T Change: No acute ST changes.  ____________________________________________  RADIOLOGY  Ct Head Wo Contrast  02/03/2015   CLINICAL DATA:  Headache, nausea and generalized weakness. History of prior stroke.  EXAM: CT HEAD WITHOUT CONTRAST  TECHNIQUE: Contiguous axial images were obtained from the base of the skull through the vertex without intravenous contrast.  COMPARISON:  None.  FINDINGS: There is a large area of encephalomalacia  within the right frontoparietal cortex, consistent with prior infarct. No mass effect or midline shift. No evidence of acute intracranial hemorrhage, or infarction. No abnormal extra-axial fluid collections. Basal cisterns are preserved. There is brain parenchymal atrophy.  No depressed skull fractures. Visualized paranasal sinuses and mastoid air cells are not opacified.  IMPRESSION: No acute intracranial abnormality.  Right frontoparietal encephalomalacia, consistent with prior infarction.   Electronically Signed   By: Ted Mcalpine M.D.   On: 02/03/2015 18:08    ____________________________________________   PROCEDURES  Procedure(s) performed: None  Critical Care performed: No ____________________________________________   INITIAL IMPRESSION / ASSESSMENT AND PLAN / ED COURSE  Pertinent labs & imaging results that were available during my care of the patient were reviewed by me and considered in my medical decision making (see chart for details).  69 y.o. female with remote history of CVA, recent diagnosis of pyelonephritis presenting with generalized weakness and pain behind the right knee. The patient's physical  exam does have some neurologic deficits that appear to be consistent with her previous remote stroke. I do not see any evidence of weakness or sensory deficits on the right side. The symptoms that she is describing are generalized and more consistent with another process, of which undertreated UTI is possible as well. Her UA from clinic is contaminated with squamous cells. I will repeat this. We'll also get baseline labs, EKG and troponin to evaluate for other sources of generalized weakness in an elderly patient. CT head was also ordered.  ----------------------------------------- 6:20 PM on 02/03/2015 -----------------------------------------  CT head shows no acute intracranial process, she does have some changes that are consistent with previous stroke.  ----------------------------------------- 7:51 PM on 02/03/2015 -----------------------------------------  The patient is stable and does not have any new symptoms at this time she has no focal neurologic deficits that are new and CT scan is negative. I am awaiting a catheterized sample of her urine to make a final disposition regarding her UTI, to decide whether she is responding to her current antibiotic regimen. Afterwards, she will be discharged with instructions to follow up with her PMD either tomorrow or Monday at the latest. She understands return and follow-up precautions. Her daughter is also here and understands. ____________________________________________  FINAL CLINICAL IMPRESSION(S) / ED DIAGNOSES  Final diagnoses:  Generalized weakness  Nonintractable episodic headache, unspecified headache type  Pyelonephritis      NEW MEDICATIONS STARTED DURING THIS VISIT:  New Prescriptions   No medications on file     Rockne Menghini, MD 02/03/15 1610  Rockne Menghini, MD 02/03/15 [id number]

## 2015-02-05 LAB — URINE CULTURE
Culture: NO GROWTH
Special Requests: NORMAL

## 2015-02-10 ENCOUNTER — Encounter: Payer: Self-pay | Admitting: *Deleted

## 2015-02-10 ENCOUNTER — Emergency Department
Admission: EM | Admit: 2015-02-10 | Discharge: 2015-02-11 | Disposition: A | Discharge status: Home / Self Care | Payer: Medicare Other | Attending: Emergency Medicine | Admitting: Emergency Medicine

## 2015-02-10 DIAGNOSIS — R5383 Other fatigue: Secondary | ICD-10-CM

## 2015-02-10 DIAGNOSIS — N390 Urinary tract infection, site not specified: Secondary | ICD-10-CM | POA: Diagnosis not present

## 2015-02-10 DIAGNOSIS — Z792 Long term (current) use of antibiotics: Secondary | ICD-10-CM | POA: Diagnosis not present

## 2015-02-10 DIAGNOSIS — Z79899 Other long term (current) drug therapy: Secondary | ICD-10-CM | POA: Diagnosis not present

## 2015-02-10 DIAGNOSIS — M25551 Pain in right hip: Secondary | ICD-10-CM | POA: Insufficient documentation

## 2015-02-10 DIAGNOSIS — R1110 Vomiting, unspecified: Secondary | ICD-10-CM | POA: Diagnosis present

## 2015-02-10 DIAGNOSIS — E039 Hypothyroidism, unspecified: Secondary | ICD-10-CM | POA: Insufficient documentation

## 2015-02-10 DIAGNOSIS — Z7982 Long term (current) use of aspirin: Secondary | ICD-10-CM | POA: Insufficient documentation

## 2015-02-10 DIAGNOSIS — Z791 Long term (current) use of non-steroidal anti-inflammatories (NSAID): Secondary | ICD-10-CM | POA: Insufficient documentation

## 2015-02-10 NOTE — ED Notes (Signed)
Pt reports HTN and vomiting starting this evening.  Pt reports intermittent right flank pain over past couple weeks, especially when walking.  Pt reports hx of UTI recently tx with antibiotics.  Pt NAD at this time.

## 2015-02-10 NOTE — ED Notes (Signed)
Pt reports hypertension and vomiting since 1900 tonight. No pain.

## 2015-02-11 LAB — URINALYSIS COMPLETE WITH MICROSCOPIC (ARMC ONLY)
Bacteria, UA: NONE SEEN
Bilirubin Urine: NEGATIVE
Glucose, UA: NEGATIVE mg/dL
Hgb urine dipstick: NEGATIVE
Ketones, ur: NEGATIVE mg/dL
Nitrite: NEGATIVE
Protein, ur: NEGATIVE mg/dL
Specific Gravity, Urine: 1.017 (ref 1.005–1.030)
pH: 6 (ref 5.0–8.0)

## 2015-02-11 LAB — BASIC METABOLIC PANEL
Anion gap: 9 (ref 5–15)
BUN: 10 mg/dL (ref 6–20)
CO2: 28 mmol/L (ref 22–32)
Calcium: 9 mg/dL (ref 8.9–10.3)
Chloride: 103 mmol/L (ref 101–111)
Creatinine, Ser: 0.66 mg/dL (ref 0.44–1.00)
GFR calc Af Amer: >60 mL/min (ref >60)
GFR calc non Af Amer: >60 mL/min (ref >60)
Glucose, Bld: 115 mg/dL — ABNORMAL HIGH (ref 65–99)
Potassium: 3.8 mmol/L (ref 3.5–5.1)
Sodium: 140 mmol/L (ref 135–145)

## 2015-02-11 LAB — CBC WITH DIFFERENTIAL/PLATELET
Basophils Absolute: 0.1 10*3/uL (ref 0–0.1)
Basophils Relative: 1 %
Eosinophils Absolute: 0.1 10*3/uL (ref 0–0.7)
Eosinophils Relative: 2 %
HCT: 38.7 % (ref 35.0–47.0)
Hemoglobin: 12.7 g/dL (ref 12.0–16.0)
Lymphocytes Relative: 35 %
Lymphs Abs: 2.6 10*3/uL (ref 1.0–3.6)
MCH: 23.5 pg — ABNORMAL LOW (ref 26.0–34.0)
MCHC: 32.9 g/dL (ref 32.0–36.0)
MCV: 71.2 fL — ABNORMAL LOW (ref 80.0–100.0)
Monocytes Absolute: 0.8 10*3/uL (ref 0.2–0.9)
Monocytes Relative: 10 %
Neutro Abs: 3.9 10*3/uL (ref 1.4–6.5)
Neutrophils Relative %: 52 %
Platelets: 240 10*3/uL (ref 150–440)
RBC: 5.43 MIL/uL — ABNORMAL HIGH (ref 3.80–5.20)
RDW: 15.9 % — ABNORMAL HIGH (ref 11.5–14.5)
WBC: 7.5 10*3/uL (ref 3.6–11.0)

## 2015-02-11 LAB — TSH: TSH: 4.793 u[IU]/mL — ABNORMAL HIGH (ref 0.350–4.500)

## 2015-02-11 MED ORDER — SULFAMETHOXAZOLE-TRIMETHOPRIM 800-160 MG PO TABS: 1.0000 | ORAL_TABLET | Freq: Two times a day (BID) | ORAL | Status: DC

## 2015-02-11 MED ORDER — SULFAMETHOXAZOLE-TRIMETHOPRIM 800-160 MG PO TABS
1.0000 | ORAL_TABLET | Freq: Once | ORAL | Status: AC
  Administered 2015-02-11: 1 via ORAL
  Filled 2015-02-11: qty 1

## 2015-02-11 NOTE — ED Provider Notes (Signed)
[facility] Emergency Department Provider Note  ____________________________________________  Time seen: 11:30 PM  I have reviewed the triage vital signs and the nursing notes.   HISTORY  Chief Complaint Hypertension and Emesis    HPI Sharon York is a 69 y.o. female who complains of decreased energy for the past 2-3 weeks as well as urinary frequency. She also complains of some right hip pain but no trauma or falls. She was seen in the emergency department about a week ago told she had a urinary tract infection and started on some antibiotics. She reports the symptoms have not gotten better. She is eating and drinking normally. No chest pain shortness of breath or chills or syncope..     Past Medical History  Diagnosis Date  . Osteoporosis   . Overactive bladder   . Stroke      Patient Active Problem List   Diagnosis Date Noted  . Abnormal gait 12/15/2014  . Altered bowel function 12/15/2014  . Blood pressure elevated without history of HTN 12/15/2014  . History of cerebrovascular accident with residual deficit 12/15/2014  . Absence of bladder continence 12/15/2014  . Obstructive sleep apnea of adult 12/15/2014  . Arthritis of shoulder region, degenerative 12/15/2014  . OP (osteoporosis) 12/15/2014  . Allergic rhinitis, seasonal 12/15/2014  . Mixed incontinence 03/11/2013     Past Surgical History  Procedure Laterality Date  . Colonoscopy  2010    normal     Current Outpatient Rx  Name  Route  Sig  Dispense  Refill  . alendronate (FOSAMAX) 70 MG tablet   Oral   Take 1 tablet by mouth once a week.         Marland Kitchen aspirin 81 MG chewable tablet   Oral   Chew 1 tablet by mouth daily.         . baclofen (LIORESAL) 10 MG tablet   Oral   Take 1 tablet by mouth 2 (two) times daily.         . Calcium Carbonate-Vitamin D (CALCIUM 500 + D) 500-125 MG-UNIT TABS   Oral   Take by mouth.         . mirabegron ER (MYRBETRIQ) 25 MG TB24  tablet   Oral   Take 1 tablet by mouth daily.         Marland Kitchen nystatin cream (MYCOSTATIN)   Topical   Apply 1 application topically 2 (two) times daily.   30 g   2   . Methylcellulose, Laxative, (FIBER THERAPY PO)   Oral   Take 2 capsules by mouth daily.         . nitrofurantoin, macrocrystal-monohydrate, (MACROBID) 100 MG capsule   Oral   Take 1 capsule (100 mg total) by mouth 2 (two) times daily.   14 capsule   0   . sulfamethoxazole-trimethoprim (BACTRIM DS) 800-160 MG per tablet   Oral   Take 1 tablet by mouth 2 (two) times daily.   14 tablet   0      Allergies Review of patient's allergies indicates no known allergies.   Family History  Problem Relation Age of Onset  . Diabetes Mother     Social History Social History  Substance Use Topics  . Smoking status: Never Smoker   . Smokeless tobacco: None  . Alcohol Use: No    Review of Systems  Constitutional:   No fever or chills. No weight changes. Positive fatigue Eyes:   No blurry vision or double vision.  ENT:   No sore throat. Cardiovascular:   No chest pain. Respiratory:   No dyspnea or cough. Gastrointestinal:   Negative for abdominal pain, vomiting and diarrhea.  No BRBPR or melena. Genitourinary:   Negative for dysuria, urinary retention, bloody urine, or difficulty urinating. Musculoskeletal:   Negative for back pain. Right hip pain worse with standing. Better lying supine. Skin:   Negative for rash. Neurological:   Negative for headaches, focal weakness or numbness. Psychiatric:  No anxiety or depression.   Endocrine:  No hot/cold intolerance, changes in energy, or sleep difficulty.  10-point ROS otherwise negative.  ____________________________________________   PHYSICAL EXAM:  VITAL SIGNS: ED Triage Vitals  Enc Vitals Group     BP 02/10/15 2037 165/67 mmHg     Pulse Rate 02/10/15 2037 83     Resp 02/10/15 2037 18     Temp 02/10/15 2037 98.1 F (36.7 C)     Temp Source 02/10/15  2037 Oral     SpO2 02/10/15 2037 95 %     Weight 02/10/15 2037 161 lb (73.029 kg)     Height 02/10/15 2037  (1.448 m)     Head Cir --      Peak Flow --      Pain Score 02/10/15 2253 0     Pain Loc --      Pain Edu? --      Excl. in GC? --      Constitutional:   Alert and oriented. Well appearing and in no distress. Eyes:   No scleral icterus. No conjunctival pallor. PERRL. EOMI ENT   Head:   Normocephalic and atraumatic.   Nose:   No congestion/rhinnorhea. No septal hematoma   Mouth/Throat:   MMM, no pharyngeal erythema. No peritonsillar mass. No uvula shift.   Neck:   No stridor. No SubQ emphysema. No meningismus. Hematological/Lymphatic/Immunilogical:   No cervical lymphadenopathy. Cardiovascular:   RRR. Normal and symmetric distal pulses are present in all extremities. No murmurs, rubs, or gallops. Respiratory:   Normal respiratory effort without tachypnea nor retractions. Breath sounds are clear and equal bilaterally. No wheezes/rales/rhonchi. Gastrointestinal:   Soft and nontender. No distention. There is no CVA tenderness.  No rebound, rigidity, or guarding.  Genitourinary:   deferred Musculoskeletal:   Nontender with normal range of motion in all extremities. No joint effusions.  No lower extremity tenderness.  No edema.Very closely spaced inferior costal margin and superior edge of the iliac wing. On standing the patient reports worsening pain in this area, and palpation of the area along the right flank where she indicates does reveal that the ribs and the pelvis are rubbing together. Neurologic:   Normal speech and language.  CN 2-10 normal. Motor grossly intact. No pronator drift.  Normal gait. No gross focal neurologic deficits are appreciated.  Skin:    Skin is warm, dry and intact. No rash noted.  No petechiae, purpura, or bullae. Psychiatric:   Mood and affect are normal. Speech and behavior are normal. Patient exhibits appropriate insight and  judgment.  ____________________________________________    LABS (pertinent positives/negatives) (all labs ordered are listed, but only abnormal results are displayed) Labs Reviewed  BASIC METABOLIC PANEL - Abnormal; Notable for the following:    Glucose, Bld 115 (*)    All other components within normal limits  CBC WITH DIFFERENTIAL/PLATELET - Abnormal; Notable for the following:    RBC 5.43 (*)    MCV 71.2 (*)    MCH 23.5 (*)  RDW 15.9 (*)    All other components within normal limits  URINALYSIS COMPLETEWITH MICROSCOPIC ([facility] ONLY) - Abnormal; Notable for the following:    Color, Urine YELLOW (*)    APPearance CLEAR (*)    Leukocytes, UA 1+ (*)    Squamous Epithelial / LPF 0-5 (*)    All other components within normal limits  TSH - Abnormal; Notable for the following:    TSH 4.793 (*)    All other components within normal limits  URINE CULTURE   ____________________________________________   EKG    ____________________________________________    RADIOLOGY    ____________________________________________   PROCEDURES   ____________________________________________   INITIAL IMPRESSION / ASSESSMENT AND PLAN / ED COURSE  Pertinent labs & imaging results that were available during my care of the patient were reviewed by me and considered in my medical decision making (see chart for details).  No evidence of trauma. Low suspicion for fracture dislocation. No evidence of soft tissue infection or intra-abdominal pathology. Very low suspicion for urinary tract infection or pyelonephritis. No evidence of sepsis. Vital signs are essentially normal. We'll check labs including TSH due to the subacute fatigue. 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  Very low suspicion for cholecystitis appendicitis AAA dissection or perforation or obstruction.  ----------------------------------------- 1:29 AM on  02/11/2015 -----------------------------------------  Labs indicate likely a refractory urinary tract infection. Urine culture sent as well. TSH is also elevated suggesting hypothyroidism. Given the patient is not having severe symptoms at this time, we'll defer to primary care follow-up so that they can recheck a TSH and free T4 at that time to confirm whether she has primary hypothyroidism or subclinical hyperthyroidism and make a determination as far as thyroid replacement therapy. He'll also get a follow-up on her response to antibiotic therapy with urinary tract infection.   ____________________________________________   FINAL CLINICAL IMPRESSION(S) / ED DIAGNOSES  Final diagnoses:  Other fatigue  Hypothyroidism, unspecified hypothyroidism type  Urinary tract infection without hematuria, site unspecified      Sharman Cheek, MD 02/11/15 0130

## 2015-02-11 NOTE — Discharge Instructions (Signed)
Fatigue °Fatigue is a feeling of tiredness, lack of energy, lack of motivation, or feeling tired all the time. Having enough rest, good nutrition, and reducing stress will normally reduce fatigue. Consult your caregiver if it persists. The nature of your fatigue will help your caregiver to find out its cause. The treatment is based on the cause.  °CAUSES  °There are many causes for fatigue. Most of the time, fatigue can be traced to one or more of your habits or routines. Most causes fit into one or more of three general areas. They are: °Lifestyle problems °· Sleep disturbances. °· Overwork. °· Physical exertion. °· Unhealthy habits. °· Poor eating habits or eating disorders. °· Alcohol and/or drug use . °· Lack of proper nutrition (malnutrition). °Psychological problems °· Stress and/or anxiety problems. °· Depression. °· Grief. °· Boredom. °Medical Problems or Conditions °· Anemia. °· Pregnancy. °· Thyroid gland problems. °· Recovery from major surgery. °· Continuous pain. °· Emphysema or asthma that is not well controlled °· Allergic conditions. °· Diabetes. °· Infections (such as mononucleosis). °· Obesity. °· Sleep disorders, such as sleep apnea. °· Heart failure or other heart-related problems. °· Cancer. °· Kidney disease. °· Liver disease. °· Effects of certain medicines such as antihistamines, cough and cold remedies, prescription pain medicines, heart and blood pressure medicines, drugs used for treatment of cancer, and some antidepressants. °SYMPTOMS  °The symptoms of fatigue include:  °· Lack of energy. °· Lack of drive (motivation). °· Drowsiness. °· Feeling of indifference to the surroundings. °DIAGNOSIS  °The details of how you feel help guide your caregiver in finding out what is causing the fatigue. You will be asked about your present and past health condition. It is important to review all medicines that you take, including prescription and non-prescription items. A thorough exam will be done.  You will be questioned about your feelings, habits, and normal lifestyle. Your caregiver may suggest blood tests, urine tests, or other tests to look for common medical causes of fatigue.  °TREATMENT  °Fatigue is treated by correcting the underlying cause. For example, if you have continuous pain or depression, treating these causes will improve how you feel. Similarly, adjusting the dose of certain medicines will help in reducing fatigue.  °HOME CARE INSTRUCTIONS  °· Try to get the required amount of good sleep every night. °· Eat a healthy and nutritious diet, and drink enough water throughout the day. °· Practice ways of relaxing (including yoga or meditation). °· Exercise regularly. °· Make plans to change situations that cause stress. Act on those plans so that stresses decrease over time. Keep your work and personal routine reasonable. °· Avoid street drugs and minimize use of alcohol. °· Start taking a daily multivitamin after consulting your caregiver. °SEEK MEDICAL CARE IF:  °· You have persistent tiredness, which cannot be accounted for. °· You have fever. °· You have unintentional weight loss. °· You have headaches. °· You have disturbed sleep throughout the night. °· You are feeling sad. °· You have constipation. °· You have dry skin. °· You have gained weight. °· You are taking any new or different medicines that you suspect are causing fatigue. °· You are unable to sleep at night. °· You develop any unusual swelling of your legs or other parts of your body. °SEEK IMMEDIATE MEDICAL CARE IF:  °· You are feeling confused. °· Your vision is blurred. °· You feel faint or pass out. °· You develop severe headache. °· You develop severe abdominal, pelvic, or   back pain.  You develop chest pain, shortness of breath, or an irregular or fast heartbeat.  You are unable to pass a normal amount of urine.  You develop abnormal bleeding such as bleeding from the rectum or you vomit blood.  You have thoughts  about harming yourself or committing suicide.  You are worried that you might harm someone else. MAKE SURE YOU:   Understand these instructions.  Will watch your condition.  Will get help right away if you are not doing well or get worse. Document Released: 03/04/2007 Document Revised: 07/30/2011 Document Reviewed: 09/08/2013 [facility] Patient Information 2015 [place], [place]. This information is not intended to replace advice given to you by your health care provider. Make sure you discuss any questions you have with your health care provider.   Hypothyroidism The thyroid is a large gland located in the lower front of your neck. The thyroid gland helps control metabolism. Metabolism is how your body handles food. It controls metabolism with the hormone thyroxine. When this gland is underactive (hypothyroid), it produces too little hormone.  CAUSES These include:   Absence or destruction of thyroid tissue.  Goiter due to iodine deficiency.  Goiter due to medications.  Congenital defects (since birth).  Problems with the pituitary. This causes a lack of TSH (thyroid stimulating hormone). This hormone tells the thyroid to turn out more hormone. SYMPTOMS  Lethargy (feeling as though you have no energy)  Cold intolerance  Weight gain (in spite of normal food intake)  Dry skin  Coarse hair  Menstrual irregularity (if severe, may lead to infertility)  Slowing of thought processes Cardiac problems are also caused by insufficient amounts of thyroid hormone. Hypothyroidism in the newborn is cretinism, and is an extreme form. It is important that this form be treated adequately and immediately or it will lead rapidly to retarded physical and mental development. DIAGNOSIS  To prove hypothyroidism, your caregiver may do blood tests and ultrasound tests. Sometimes the signs are hidden. It may be necessary for your caregiver to watch this illness with blood tests either before or after  diagnosis and treatment. TREATMENT  Low levels of thyroid hormone are increased by using synthetic thyroid hormone. This is a safe, effective treatment. It usually takes about four weeks to gain the full effects of the medication. After you have the full effect of the medication, it will generally take another four weeks for problems to leave. Your caregiver may start you on low doses. If you have had heart problems the dose may be gradually increased. It is generally not an emergency to get rapidly to normal. HOME CARE INSTRUCTIONS   Take your medications as your caregiver suggests. Let your caregiver know of any medications you are taking or start taking. Your caregiver will help you with dosage schedules.  As your condition improves, your dosage needs may increase. It will be necessary to have continuing blood tests as suggested by your caregiver.  Report all suspected medication side effects to your caregiver. SEEK MEDICAL CARE IF: Seek medical care if you develop:  Sweating.  Tremulousness (tremors).  Anxiety.  Rapid weight loss.  Heat intolerance.  Emotional swings.  Diarrhea.  Weakness. SEEK IMMEDIATE MEDICAL CARE IF:  You develop chest pain, an irregular heart beat (palpitations), or a rapid heart beat. MAKE SURE YOU:   Understand these instructions.  Will watch your condition.  Will get help right away if you are not doing well or get worse. Document Released: 05/07/2005 Document Revised: 07/30/2011 Document  Reviewed: 12/26/2007 [employer] Patient Information 2015 [place], [place]. This information is not intended to replace advice given to you by your health care provider. Make sure you discuss any questions you have with your health care provider.  Urinary Tract Infection A urinary tract infection (UTI) can occur any place along the urinary tract. The tract includes the kidneys, ureters, bladder, and urethra. A type of germ called bacteria often causes a UTI. UTIs are  often helped with antibiotic medicine.  HOME CARE   If given, take antibiotics as told by your doctor. Finish them even if you start to feel better.  Drink enough fluids to keep your pee (urine) clear or pale yellow.  Avoid tea, drinks with caffeine, and bubbly (carbonated) drinks.  Pee often. Avoid holding your pee in for a long time.  Pee before and after having sex (intercourse).  Wipe from front to back after you poop (bowel movement) if you are a woman. Use each tissue only once. GET HELP RIGHT AWAY IF:   You have back pain.  You have lower belly (abdominal) pain.  You have chills.  You feel sick to your stomach (nauseous).  You throw up (vomit).  Your burning or discomfort with peeing does not go away.  You have a fever.  Your symptoms are not better in 3 days. MAKE SURE YOU:   Understand these instructions.  Will watch your condition.  Will get help right away if you are not doing well or get worse. Document Released: 10/24/2007 Document Revised: 01/30/2012 Document Reviewed: 12/06/2011 [facility] Patient Information 2015 [place], [place]. This information is not intended to replace advice given to you by your health care provider. Make sure you discuss any questions you have with your health care provider.

## 2015-02-13 LAB — URINE CULTURE

## 2015-02-18 ENCOUNTER — Encounter: Payer: Self-pay | Admitting: Internal Medicine

## 2015-02-18 ENCOUNTER — Ambulatory Visit (INDEPENDENT_AMBULATORY_CARE_PROVIDER_SITE_OTHER): Payer: Medicare Other | Admitting: Internal Medicine

## 2015-02-18 VITALS — BP 120/60 | HR 80 | Ht 59.0 in | Wt 164.2 lb

## 2015-02-18 DIAGNOSIS — Z23 Encounter for immunization: Secondary | ICD-10-CM

## 2015-02-18 DIAGNOSIS — R300 Dysuria: Secondary | ICD-10-CM

## 2015-02-18 DIAGNOSIS — R7989 Other specified abnormal findings of blood chemistry: Secondary | ICD-10-CM

## 2015-02-18 DIAGNOSIS — R946 Abnormal results of thyroid function studies: Secondary | ICD-10-CM

## 2015-02-18 LAB — POCT URINALYSIS DIPSTICK
Bilirubin, UA: NEGATIVE
Blood, UA: NEGATIVE
Glucose, UA: NEGATIVE
Ketones, UA: NEGATIVE
Leukocytes, UA: NEGATIVE
Nitrite, UA: NEGATIVE
Protein, UA: NEGATIVE
Spec Grav, UA: 1.01
Urobilinogen, UA: 0.2
pH, UA: 5

## 2015-02-18 NOTE — Progress Notes (Signed)
Date:  02/18/2015   Name:  Sharon York   DOB:  Jul 14, 1945   MRN:  [id number]   Chief Complaint: Thyroid Problem Thyroid Problem Presents for initial (TSH 4.7 at ER) visit. Patient reports no diarrhea, fatigue, palpitations or tremors.   Patient was seen in the emergency room for several visits. Visit complaining of fatigue and weakness and side discomfort. She was eventually diagnosed with a urinary tract infection and treated initially with nitrofurantoin. This caused nausea and worsening of symptoms and she went back to the ER and was placed on Bactrim. At the same time they also did some blood work which revealed a slightly elevated TSH. He is here today for a recheck. Her urine symptoms are resolved. She is getting her energy back. She denies any lower extremity swelling, constipation, goiter, or tremor. Specimen Description URINE, RANDOM   Special Requests NONE   Culture MULTIPLE SPECIES PRESENT, SUGGEST RECOLLECTION   Report Status 02/13/2015 FINAL   Resulting Agency [facility]                   Ref Range 8d ago  69mo ago     TSH 0.350 - 4.500 uIU/mL 4.793 (H) 3.40R          Review of Systems:  Review of Systems  Constitutional: Negative for fever, fatigue and unexpected weight change.  HENT: Negative for trouble swallowing.   Respiratory: Negative for shortness of breath.   Cardiovascular: Negative for chest pain, palpitations and leg swelling.  Gastrointestinal: Negative for nausea and diarrhea.  Genitourinary: Positive for flank pain. Negative for dysuria, urgency, frequency and pelvic pain.  Neurological: Negative for tremors and syncope.    Patient Active Problem List   Diagnosis Date Noted  . Abnormal gait 12/15/2014  . Altered bowel function 12/15/2014  . Blood pressure elevated without history of HTN 12/15/2014  . History of cerebrovascular accident with residual deficit 12/15/2014  . Absence of bladder continence 12/15/2014  . Obstructive sleep apnea  of adult 12/15/2014  . Arthritis of shoulder region, degenerative 12/15/2014  . OP (osteoporosis) 12/15/2014  . Allergic rhinitis, seasonal 12/15/2014  . Mixed incontinence 03/11/2013    Prior to Admission medications   Medication Sig Start Date End Date Taking? Authorizing Provider  alendronate (FOSAMAX) 70 MG tablet Take 1 tablet by mouth once a week. 05/20/14  Yes Historical Provider, MD  aspirin 81 MG chewable tablet Chew 1 tablet by mouth daily.   Yes Historical Provider, MD  baclofen (LIORESAL) 10 MG tablet Take 1 tablet by mouth 2 (two) times daily. 05/20/14  Yes Historical Provider, MD  Calcium Carbonate-Vitamin D (CALCIUM 500 + D) 500-125 MG-UNIT TABS Take by mouth.   Yes Historical Provider, MD  Methylcellulose, Laxative, (FIBER THERAPY PO) Take 2 capsules by mouth daily.   Yes Historical Provider, MD  mirabegron ER (MYRBETRIQ) 25 MG TB24 tablet Take 1 tablet by mouth daily.   Yes Historical Provider, MD  nystatin cream (MYCOSTATIN) Apply 1 application topically 2 (two) times daily. 01/27/15  Yes Schuyler Amor, MD    Allergies  Allergen Reactions  . Nitrofurantoin Nausea Only    Past Surgical History  Procedure Laterality Date  . Colonoscopy  2010    normal    Social History  Substance Use Topics  . Smoking status: Never Smoker   . Smokeless tobacco: None  . Alcohol Use: No     Medication list has been reviewed and updated.  Physical Examination:  Physical Exam  Constitutional: She appears well-developed.  Eyes: EOM and lids are normal. Pupils are equal, round, and reactive to light.  Neck: No JVD present. No tracheal tenderness present. No thyroid mass and no thyromegaly present.  Cardiovascular: Normal rate, regular rhythm and normal heart sounds.   Pulmonary/Chest: Effort normal and breath sounds normal.  Abdominal: Soft. Normal appearance. There is no tenderness.  Psychiatric: She has a normal mood and affect.    BP 120/60 mmHg  Pulse 80  Ht   (1.499 m)  Wt 164 lb 3.2 oz (74.481 kg)  BMI 33.15 kg/m2  Assessment and Plan: 1. Dysuria Urinalysis is negative Bactrim has been completed, no further antibiotics needed - POCT urinalysis dipstick  2. Elevated TSH Check thyroid panel and advise - Thyroid Panel With TSH  3. Need for influenza vaccination - Flu Vaccine QUAD 36+ mos IM   Bari Edward, MD [facility]  02/18/2015

## 2015-02-19 LAB — THYROID PANEL WITH TSH
Free Thyroxine Index: 2.4 (ref 1.2–4.9)
T3 Uptake Ratio: 25 % (ref 24–39)
T4, Total: 9.4 ug/dL (ref 4.5–12.0)
TSH: 3.02 u[IU]/mL (ref 0.450–4.500)

## 2015-03-17 ENCOUNTER — Ambulatory Visit (INDEPENDENT_AMBULATORY_CARE_PROVIDER_SITE_OTHER): Payer: Medicare Other | Admitting: Internal Medicine

## 2015-03-17 ENCOUNTER — Encounter: Payer: Self-pay | Admitting: Internal Medicine

## 2015-03-17 VITALS — BP 142/72 | HR 80 | Ht 59.0 in | Wt 164.4 lb

## 2015-03-17 DIAGNOSIS — B354 Tinea corporis: Secondary | ICD-10-CM

## 2015-03-17 DIAGNOSIS — R058 Other specified cough: Secondary | ICD-10-CM

## 2015-03-17 DIAGNOSIS — N3946 Mixed incontinence: Secondary | ICD-10-CM

## 2015-03-17 DIAGNOSIS — R05 Cough: Secondary | ICD-10-CM | POA: Diagnosis not present

## 2015-03-17 MED ORDER — BENZONATATE 100 MG PO CAPS: 100.0000 mg | ORAL_CAPSULE | Freq: Every day | ORAL | Status: DC

## 2015-03-17 MED ORDER — NYSTATIN 100000 UNIT/GM EX POWD: 1.0000 g | Freq: Two times a day (BID) | CUTANEOUS | Status: DC

## 2015-03-17 MED ORDER — MIRABEGRON ER 50 MG PO TB24
50.0000 mg | ORAL_TABLET | Freq: Every day | ORAL | Status: DC
Start: 2015-03-17 — End: 2016-05-11

## 2015-03-17 MED ORDER — BACLOFEN 10 MG PO TABS: 10.0000 mg | ORAL_TABLET | Freq: Two times a day (BID) | ORAL | Status: DC

## 2015-03-17 MED ORDER — ALENDRONATE SODIUM 70 MG PO TABS: 70.0000 mg | ORAL_TABLET | ORAL | Status: DC

## 2015-03-17 NOTE — Progress Notes (Signed)
Date:  03/17/2015   Name:  Sharon York   DOB:  [birth year]   MRN:  [id number]   Chief Complaint: Rash  Patient has chronic tenia corporis under both breasts. She is currently using nystatin cream. Her stroke makes it difficult for her to see her skin but it's very pruritic. She denies a similar rash in any other location.  She also complains of a cough. Seems to bother her mostly when she is sleeping. It's dry and nonproductive. During the day she does not have any symptoms. No wheezing, shortness of breath or fever. She is requesting a medication to suppress her cough.  Review of Systems  Constitutional: Negative for fever and chills.  Respiratory: Positive for cough. Negative for choking, chest tightness, shortness of breath and wheezing.   Cardiovascular: Negative for chest pain.  Gastrointestinal: Negative for abdominal pain.  Genitourinary: Negative for vaginal discharge.  Skin: Positive for rash (and itching under both breasts).    Patient Active Problem List   Diagnosis Date Noted  . Abnormal gait 12/15/2014  . Altered bowel function 12/15/2014  . Blood pressure elevated without history of HTN 12/15/2014  . History of cerebrovascular accident with residual deficit 12/15/2014  . Absence of bladder continence 12/15/2014  . Obstructive sleep apnea of adult 12/15/2014  . Arthritis of shoulder region, degenerative 12/15/2014  . OP (osteoporosis) 12/15/2014  . Allergic rhinitis, seasonal 12/15/2014  . Mixed incontinence 03/11/2013  . 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 Authorizing Provider  alendronate (FOSAMAX) 70 MG tablet Take 1 tablet by mouth once a week. 05/20/14  Yes Historical Provider, MD  aspirin 81 MG chewable tablet Chew 1 tablet by mouth daily.   Yes Historical Provider, MD  baclofen (LIORESAL) 10 MG tablet Take 1 tablet by mouth 2 (two) times daily. 05/20/14  Yes Historical  Provider, MD  Calcium Carbonate-Vitamin D (CALCIUM 500 + D) 500-125 MG-UNIT TABS Take by mouth.   Yes Historical Provider, MD  mirabegron ER (MYRBETRIQ) 50 MG TB24 tablet TAKE ONE TABLET BY MOUTH ONCE DAILY 02/21/15  Yes Historical Provider, MD  nystatin cream (MYCOSTATIN) Apply 1 application topically 2 (two) times daily. 01/27/15  Yes Schuyler Amor, MD  Methylcellulose, Laxative, (FIBER THERAPY PO) Take 2 capsules by mouth daily.    Historical Provider, MD    Allergies  Allergen Reactions  . Nitrofurantoin Nausea Only    Past Surgical History  Procedure Laterality Date  . Colonoscopy  2010    normal    Social History  Substance Use Topics  . Smoking status: Never Smoker   . Smokeless tobacco: None  . Alcohol Use: No     Medication list has been reviewed and updated.  Physical Examination:  Physical Exam  Constitutional: She appears well-developed and well-nourished.  Neck: Normal range of motion. Neck supple.  Cardiovascular: Normal rate, regular rhythm and normal heart sounds.   Pulmonary/Chest: Effort normal and breath sounds normal. No respiratory distress. She has no wheezes.  Lymphadenopathy:    She has no cervical adenopathy.  Skin:  Resolving tinea under both breasts - minimal erythema without maceration. There is mild hyperpigmentation from the previous rash.  Psychiatric: She has a normal mood and affect.  Nursing note and vitals reviewed.   BP 142/72 mmHg  Pulse 80  Ht  (1.499 m)  Wt 164 lb 6.4 oz (74.571 kg)  BMI 33.19 kg/m2  Assessment and Plan: 1. Tinea  corporis She was reassured it is improving Continue ointment and dust with powder for ongoing treatment - nystatin (MYCOSTATIN/NYSTOP) 100000 UNIT/GM POWD; Apply 1 g topically 2 (two) times daily.  Dispense: 60 g; Refill: 1  2. Mixed incontinence Doing well on Myrbetriq - mirabegron ER (MYRBETRIQ) 50 MG TB24 tablet; Take 1 tablet (50 mg total) by mouth daily.  Dispense: 30 tablet; Refill:  12  3. Nocturnal cough No evidence of infection; likely postnasal drip Return if worsening - benzonatate (TESSALON) 100 MG capsule; Take 1 capsule (100 mg total) by mouth at bedtime.  Dispense: 30 capsule; Refill: 3   Bari EdwardLaura Voncille Simm, MD [facility]  03/17/2015

## 2015-06-02 ENCOUNTER — Ambulatory Visit (INDEPENDENT_AMBULATORY_CARE_PROVIDER_SITE_OTHER): Payer: Medicare Other | Admitting: Internal Medicine

## 2015-06-02 ENCOUNTER — Encounter: Payer: Self-pay | Admitting: Internal Medicine

## 2015-06-02 VITALS — BP 116/60 | HR 88 | Ht 59.0 in | Wt 170.6 lb

## 2015-06-02 DIAGNOSIS — E8842 MERRF syndrome: Secondary | ICD-10-CM

## 2015-06-02 DIAGNOSIS — W07XXXA Fall from chair, initial encounter: Secondary | ICD-10-CM

## 2015-06-02 NOTE — Progress Notes (Signed)
Date:  06/02/2015   Name:  Sharon York   DOB:  09-08-1945   MRN:  [id number]   Chief Complaint: Fall Patient presents after a fall yesterday. She was being pushed in a manual wheelchair by her daughter at the [facility] when the chair hit a bump and she tipped backwards. She landed with her head on carpet but did not lose consciousness. She was able to get up on her own and then used a wheelchair to help her walker rather than being pushed and it.  Denies any headache or residual tenderness. Denies any arm pain or upper back pain. The wheelchair was given to her by a church member and apparently does not have anti-tippers.   Review of Systems  Constitutional: Negative for fever, chills and fatigue.  Respiratory: Negative for cough, chest tightness and shortness of breath.   Cardiovascular: Negative for chest pain, palpitations and leg swelling.  Genitourinary: Negative for dysuria.  Musculoskeletal: Positive for arthralgias (left shoulder).  Neurological: Positive for weakness. Negative for light-headedness, numbness and headaches.  Psychiatric/Behavioral: Negative for confusion and dysphoric mood.    Patient Active Problem List   Diagnosis Date Noted  . Tinea corporis 03/17/2015  . Nocturnal cough 03/17/2015  . Abnormal gait 12/15/2014  . Altered bowel function 12/15/2014  . Blood pressure elevated without history of HTN 12/15/2014  . History of cerebrovascular accident with residual deficit 12/15/2014  . Absence of bladder continence 12/15/2014  . Obstructive sleep apnea of adult 12/15/2014  . Arthritis of shoulder region, degenerative 12/15/2014  . OP (osteoporosis) 12/15/2014  . Allergic rhinitis, seasonal 12/15/2014  . Mixed incontinence 03/11/2013  . 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 Authorizing Provider  alendronate (FOSAMAX) 70 MG tablet Take 1 tablet (70 mg total) by  mouth once a week. 03/17/15  Yes Reubin Milan, MD  aspirin 81 MG chewable tablet Chew 1 tablet by mouth daily.   Yes Historical Provider, MD  baclofen (LIORESAL) 10 MG tablet Take 1 tablet (10 mg total) by mouth 2 (two) times daily. 03/17/15  Yes Reubin Milan, MD  Calcium Carbonate-Vitamin D (CALCIUM 500 + D) 500-125 MG-UNIT TABS Take by mouth.   Yes Historical Provider, MD  Methylcellulose, Laxative, (FIBER THERAPY PO) Take 2 capsules by mouth daily.   Yes Historical Provider, MD  mirabegron ER (MYRBETRIQ) 50 MG TB24 tablet Take 1 tablet (50 mg total) by mouth daily. 03/17/15  Yes Reubin Milan, MD  nystatin (MYCOSTATIN/NYSTOP) 100000 UNIT/GM POWD Apply 1 g topically 2 (two) times daily. 03/17/15  Yes Reubin Milan, MD  nystatin cream (MYCOSTATIN) Apply 1 application topically 2 (two) times daily. 01/27/15  Yes Schuyler Amor, MD    Allergies  Allergen Reactions  . Nitrofurantoin Nausea Only    Past Surgical History  Procedure Laterality Date  . Colonoscopy  2010    normal    Social History  Substance Use Topics  . Smoking status: Never Smoker   . Smokeless tobacco: None  . Alcohol Use: No     Medication list has been reviewed and updated.   Physical Exam  Constitutional: She is oriented to person, place, and time. She appears well-developed and well-nourished. No distress.  HENT:  Head: Normocephalic and atraumatic.  Neck: Normal range of motion. Neck supple.  Cardiovascular: Normal rate, regular rhythm and normal heart sounds.   Pulmonary/Chest: Effort normal and breath sounds normal. No  respiratory distress.  Musculoskeletal: She exhibits no edema or tenderness.       Left shoulder: She exhibits decreased range of motion (due to old CVA). She exhibits no tenderness.       Cervical back: She exhibits normal range of motion and no tenderness.  Neurological: She is alert and oriented to person, place, and time.  Skin: Skin is warm and dry. No rash noted.    Psychiatric: She has a normal mood and affect. Her behavior is normal. Thought content normal.  Nursing note and vitals reviewed.   BP 116/60 mmHg  Pulse 88  Ht 4\' 11"  (1.499 m)  Wt 170 lb 9.6 oz (77.384 kg)  BMI 34.44 kg/m2  Assessment and Plan: 1. Accidental fall from chair, initial encounter Patient sustained no injury Wheelchair safety discussed She'll investigate anti-tippers for her wheelchair   Bari EdwardLaura Makhai Fulco, MD [facility]  06/02/2015

## 2015-10-24 DIAGNOSIS — N3946 Mixed incontinence: Secondary | ICD-10-CM | POA: Diagnosis not present

## 2015-10-24 DIAGNOSIS — N390 Urinary tract infection, site not specified: Secondary | ICD-10-CM | POA: Diagnosis not present

## 2015-11-28 ENCOUNTER — Ambulatory Visit (INDEPENDENT_AMBULATORY_CARE_PROVIDER_SITE_OTHER): Payer: Medicare Other | Admitting: Internal Medicine

## 2015-11-28 ENCOUNTER — Encounter: Payer: Self-pay | Admitting: Internal Medicine

## 2015-11-28 VITALS — BP 118/76 | HR 82 | Temp 98.3°F | Resp 16 | Ht 59.0 in | Wt 169.0 lb

## 2015-11-28 DIAGNOSIS — Z1231 Encounter for screening mammogram for malignant neoplasm of breast: Secondary | ICD-10-CM | POA: Diagnosis not present

## 2015-11-28 DIAGNOSIS — R351 Nocturia: Secondary | ICD-10-CM | POA: Diagnosis not present

## 2015-11-28 LAB — POC URINALYSIS WITH MICROSCOPIC (NON AUTO)MANUAL RESULT
Bacteria, UA: 0
Bilirubin, UA: NEGATIVE
Blood, UA: NEGATIVE
Crystals: 0
Epithelial cells, urine per micros: 0
Glucose, UA: NEGATIVE
Ketones, UA: NEGATIVE
Leukocytes, UA: NEGATIVE
Mucus, UA: 0
Nitrite, UA: NEGATIVE
Protein, UA: NEGATIVE
RBC: 0 M/uL — AB (ref 4.04–5.48)
Spec Grav, UA: 1.015
WBC Casts, UA: 0
pH, UA: 7

## 2015-11-28 MED ORDER — SULFAMETHOXAZOLE-TRIMETHOPRIM 800-160 MG PO TABS: 1.0000 | ORAL_TABLET | Freq: Two times a day (BID) | ORAL | Status: DC

## 2015-11-28 NOTE — Patient Instructions (Signed)
Breast Self-Awareness Practicing breast self-awareness may pick up problems early, prevent significant medical complications, and possibly save your life. By practicing breast self-awareness, you can become familiar with how your breasts look and feel and if your breasts are changing. This allows you to notice changes early. It can also offer you some reassurance that your breast health is good. One way to learn what is normal for your breasts and whether your breasts are changing is to do a breast self-exam. If you find a lump or something that was not present in the past, it is best to contact your caregiver right away. Other findings that should be evaluated by your caregiver include nipple discharge, especially if it is bloody; skin changes or reddening; areas where the skin seems to be pulled in (retracted); or new lumps and bumps. Breast pain is seldom associated with cancer (malignancy), but should also be evaluated by a caregiver. HOW TO PERFORM A BREAST SELF-EXAM The best time to examine your breasts is 5-7 days after your menstrual period is over. During menstruation, the breasts are lumpier, and it may be more difficult to pick up changes. If you do not menstruate, have reached menopause, or had your uterus removed (hysterectomy), you should examine your breasts at regular intervals, such as monthly. If you are breastfeeding, examine your breasts after a feeding or after using a breast pump. Breast implants do not decrease the risk for lumps or tumors, so continue to perform breast self-exams as recommended. Talk to your caregiver about how to determine the difference between the implant and breast tissue. Also, talk about the amount of pressure you should use during the exam. Over time, you will become more familiar with the variations of your breasts and more comfortable with the exam. A breast self-exam requires you to remove all your clothes above the waist. 1. Look at your breasts and nipples.  Stand in front of a mirror in a room with good lighting. With your hands on your hips, push your hands firmly downward. Look for a difference in shape, contour, and size from one breast to the other (asymmetry). Asymmetry includes puckers, dips, or bumps. Also, look for skin changes, such as reddened or scaly areas on the breasts. Look for nipple changes, such as discharge, dimpling, repositioning, or redness. 2. Carefully feel your breasts. This is best done either in the shower or tub while using soapy water or when flat on your back. Place the arm (on the side of the breast you are examining) above your head. Use the pads (not the fingertips) of your three middle fingers on your opposite hand to feel your breasts. Start in the underarm area and use  inch (2 cm) overlapping circles to feel your breast. Use 3 different levels of pressure (light, medium, and firm pressure) at each circle before moving to the next circle. The light pressure is needed to feel the tissue closest to the skin. The medium pressure will help to feel breast tissue a little deeper, while the firm pressure is needed to feel the tissue close to the ribs. Continue the overlapping circles, moving downward over the breast until you feel your ribs below your breast. Then, move one finger-width towards the center of the body. Continue to use the  inch (2 cm) overlapping circles to feel your breast as you move slowly up toward the collar bone (clavicle) near the base of the neck. Continue the up and down exam using all 3 pressures until you reach the   middle of the chest. Do this with each breast, carefully feeling for lumps or changes. 3.  Keep a written record with breast changes or normal findings for each breast. By writing this information down, you do not need to depend only on memory for size, tenderness, or location. Write down where you are in your menstrual cycle, if you are still menstruating. Breast tissue can have some lumps or  thick tissue. However, see your caregiver if you find anything that concerns you.  SEEK MEDICAL CARE IF:  You see a change in shape, contour, or size of your breasts or nipples.   You see skin changes, such as reddened or scaly areas on the breasts or nipples.   You have an unusual discharge from your nipples.   You feel a new lump or unusually thick areas.    This information is not intended to replace advice given to you by your health care provider. Make sure you discuss any questions you have with your health care provider.   Document Released: 05/07/2005 Document Revised: 04/23/2012 Document Reviewed: 08/22/2011 Elsevier Interactive Patient Education 2016 [employer] Inc[employer]  

## 2015-11-28 NOTE — Progress Notes (Signed)
Date:  11/28/2015   Name:  Sharon York   DOB:  06-10-1945   MRN:  [id number]   Chief Complaint: Nocturia Nausea/vomiting/diarrhea - started 5 days ago with intermittent diarrhea and flank pain on left.  She initially had vomiting but now improved. She had similar symptoms in the past that was due to a urinary tract infection. She has been drinking more water and ate normally today. She is generally feeling better but wanted to get checked.   Review of Systems  Constitutional: Positive for fatigue. Negative for fever and chills.  Respiratory: Negative for cough, chest tightness and shortness of breath.   Cardiovascular: Negative for chest pain and palpitations.  Gastrointestinal: Negative for nausea, vomiting, abdominal pain, diarrhea, constipation and blood in stool.  Genitourinary: Positive for urgency and frequency. Negative for dysuria and hematuria.    Patient Active Problem List   Diagnosis Date Noted  . Tinea corporis 03/17/2015  . Nocturnal cough 03/17/2015  . Abnormal gait 12/15/2014  . Altered bowel function 12/15/2014  . Blood pressure elevated without history of HTN 12/15/2014  . History of cerebrovascular accident with residual deficit 12/15/2014  . Absence of bladder continence 12/15/2014  . Obstructive sleep apnea of adult 12/15/2014  . Arthritis of shoulder region, degenerative 12/15/2014  . OP (osteoporosis) 12/15/2014  . Allergic rhinitis, seasonal 12/15/2014  . Mixed incontinence 03/11/2013  . 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 Authorizing Provider  alendronate (FOSAMAX) 70 MG tablet Take 1 tablet (70 mg total) by mouth once a week. 03/17/15  Yes Reubin Milan, MD  aspirin 81 MG chewable tablet Chew 1 tablet by mouth daily.   Yes Historical Provider, MD  baclofen (LIORESAL) 10 MG tablet Take 1 tablet (10 mg total) by mouth 2 (two) times daily. 03/17/15  Yes Reubin Milan, MD  Calcium Carbonate-Vitamin D (CALCIUM 500 + D) 500-125 MG-UNIT TABS Take by mouth.   Yes Historical Provider, MD  Methylcellulose, Laxative, (FIBER THERAPY PO) Take 2 capsules by mouth daily.   Yes Historical Provider, MD  mirabegron ER (MYRBETRIQ) 50 MG TB24 tablet Take 1 tablet (50 mg total) by mouth daily. 03/17/15  Yes Reubin Milan, MD  zinc gluconate 50 MG tablet Take 5 mg by mouth.   Yes Historical Provider, MD    Allergies  Allergen Reactions  . Nitrofurantoin Nausea Only    Past Surgical History  Procedure Laterality Date  . Colonoscopy  2010    normal    Social History  Substance Use Topics  . Smoking status: Never Smoker   . Smokeless tobacco: None  . Alcohol Use: No     Medication list has been reviewed and updated.   Physical Exam  Constitutional: She is oriented to person, place, and time. She appears well-developed and well-nourished. No distress.  HENT:  Head: Normocephalic and atraumatic.  Cardiovascular: Normal rate, regular rhythm and normal heart sounds.   Pulmonary/Chest: Effort normal and breath sounds normal. No respiratory distress.  Abdominal: Soft. Normal appearance. There is tenderness in the right upper quadrant. There is no rebound and no CVA tenderness.  Musculoskeletal: Normal range of motion.  Neurological: She is alert and oriented to person, place, and time.  Skin: Skin is warm and dry. No rash noted.  Psychiatric: She has a normal mood and affect. Her speech is normal and behavior is normal. Thought content normal.    BP 118/76  mmHg  Pulse 82  Temp(Src) 98.3 F (36.8 C) (Oral)  Resp 16  Ht 4\' 11"  (1.499 m)  Wt 169 lb (76.658 kg)  BMI 34.12 kg/m2  SpO2 96%  Assessment and Plan: 1. Nocturia - POC urinalysis w microscopic (non auto) - sulfamethoxazole-trimethoprim (BACTRIM DS,SEPTRA DS) 800-160 MG tablet; Take 1 tablet by mouth 2 (two) times daily.  Dispense: 10 tablet; Refill: 0  2. Encounter for screening  mammogram for breast cancer - MM DIGITAL SCREENING BILATERAL; Future   Bari EdwardLaura Yishai Rehfeld, MD [facility]  11/28/2015

## 2015-11-29 DIAGNOSIS — H2513 Age-related nuclear cataract, bilateral: Secondary | ICD-10-CM | POA: Diagnosis not present

## 2015-12-07 ENCOUNTER — Ambulatory Visit
Admission: RE | Admit: 2015-12-07 | Discharge: 2015-12-07 | Disposition: A | Discharge status: Home / Self Care | Payer: Medicare Other | Source: Ambulatory Visit | Attending: Internal Medicine | Admitting: Internal Medicine

## 2015-12-07 DIAGNOSIS — Z1231 Encounter for screening mammogram for malignant neoplasm of breast: Secondary | ICD-10-CM | POA: Insufficient documentation

## 2015-12-23 ENCOUNTER — Encounter: Payer: Self-pay | Admitting: Family Medicine

## 2015-12-23 ENCOUNTER — Ambulatory Visit (INDEPENDENT_AMBULATORY_CARE_PROVIDER_SITE_OTHER): Payer: Medicare Other | Admitting: Family Medicine

## 2015-12-23 VITALS — BP 120/70 | HR 80 | Ht 59.0 in | Wt 169.0 lb

## 2015-12-23 DIAGNOSIS — N3090 Cystitis, unspecified without hematuria: Secondary | ICD-10-CM | POA: Diagnosis not present

## 2015-12-23 LAB — POCT URINALYSIS DIPSTICK
Bilirubin, UA: NEGATIVE
Glucose, UA: NEGATIVE
Ketones, UA: NEGATIVE
Nitrite, UA: NEGATIVE
Protein, UA: NEGATIVE
Spec Grav, UA: 1.01
Urobilinogen, UA: 0.2
pH, UA: 5

## 2015-12-23 MED ORDER — SULFAMETHOXAZOLE-TRIMETHOPRIM 800-160 MG PO TABS: 1.0000 | ORAL_TABLET | Freq: Two times a day (BID) | ORAL | 0 refills | Status: DC

## 2015-12-23 NOTE — Progress Notes (Signed)
Name: Sharon York   MRN: [id number]    DOB: [birth year]   Date:12/23/2015       Progress Note  Subjective  Chief Complaint  Chief Complaint  Patient presents with  . Urinary Tract Infection    going to bathroom frequently x 1 day- no burning    Urinary Tract Infection   This is a new problem. The current episode started yesterday. The problem occurs every urination. The problem has been gradually worsening. The patient is experiencing no pain. There has been no fever. Associated symptoms include frequency and urgency. Pertinent negatives include no chills, discharge, flank pain, hematuria, hesitancy, nausea, sweats or vomiting. Associated symptoms comments: Urinary incontinence. She has tried nothing for the symptoms. Her past medical history is significant for recurrent UTIs.    No problem-specific Assessment & Plan notes found for this encounter.   Past Medical History:  Diagnosis Date  . Osteoporosis   . Overactive bladder   . Stroke [facility])     Past Surgical History:  Procedure Laterality Date  . COLONOSCOPY  2010   normal    Family History  Problem Relation Age of Onset  . Diabetes Mother   . Thyroid disease Mother   . Breast cancer Maternal Aunt   . Breast cancer Cousin     mat cousin    Social History   Social History  . Marital status: Single    Spouse name: N/A  . Number of children: N/A  . Years of education: N/A   Occupational History  . Not on file.   Social History Main Topics  . Smoking status: Never Smoker  . Smokeless tobacco: Not on file  . Alcohol use No  . Drug use: No  . Sexual activity: No   Other Topics Concern  . Not on file   Social History Narrative  . No narrative on file    Allergies  Allergen Reactions  . Nitrofurantoin Nausea Only     Review of Systems  Constitutional: Negative for chills, fever, malaise/fatigue and weight loss.  HENT: Negative for ear discharge, ear pain and sore throat.   Eyes: Negative for blurred  vision.  Respiratory: Negative for cough, sputum production, shortness of breath and wheezing.   Cardiovascular: Negative for chest pain, palpitations and leg swelling.  Gastrointestinal: Negative for abdominal pain, blood in stool, constipation, diarrhea, heartburn, melena, nausea and vomiting.  Genitourinary: Positive for frequency and urgency. Negative for dysuria, flank pain, hematuria and hesitancy.  Musculoskeletal: Negative for back pain, joint pain, myalgias and neck pain.  Skin: Negative for rash.  Neurological: Negative for dizziness, tingling, sensory change, focal weakness and headaches.  Endo/Heme/Allergies: Negative for environmental allergies and polydipsia. Does not bruise/bleed easily.  Psychiatric/Behavioral: Negative for depression and suicidal ideas. The patient is not nervous/anxious and does not have insomnia.      Objective  Vitals:   12/23/15 1625  BP: 120/70  Pulse: 80  Weight: 169 lb (76.7 kg)  Height:  (1.499 m)    Physical Exam  Constitutional: She is well-developed, well-nourished, and in no distress. No distress.  HENT:  Head: Normocephalic and atraumatic.  Right Ear: External ear normal.  Left Ear: External ear normal.  Nose: Nose normal.  Mouth/Throat: Oropharynx is clear and moist.  Eyes: Conjunctivae and EOM are normal. Pupils are equal, round, and reactive to light. Right eye exhibits no discharge. Left eye exhibits no discharge.  Neck: Normal range of motion. Neck supple. No JVD present. No thyromegaly present.  Cardiovascular: Normal rate, regular rhythm, normal heart sounds and intact distal pulses.  Exam reveals no gallop and no friction rub.   No murmur heard. Pulmonary/Chest: Effort normal and breath sounds normal.  Abdominal: Soft. Bowel sounds are normal. She exhibits no mass. There is no tenderness. There is no guarding.  Musculoskeletal: Normal range of motion. She exhibits no edema.  Lymphadenopathy:    She has no cervical  adenopathy.  Neurological: She is alert.  Skin: Skin is warm and dry. She is not diaphoretic.  Psychiatric: Mood and affect normal.  Nursing note and vitals reviewed.     Assessment & Plan  Problem List Items Addressed This Visit    None    Visit Diagnoses    Cystitis    -  Primary   Relevant Medications   sulfamethoxazole-trimethoprim (BACTRIM DS,SEPTRA DS) 800-160 MG tablet   Other Relevant Orders   POCT Urinalysis Dipstick (Completed)        Dr. [facility]  12/23/15

## 2016-03-20 ENCOUNTER — Other Ambulatory Visit: Payer: Self-pay | Admitting: Internal Medicine

## 2016-05-11 ENCOUNTER — Ambulatory Visit (INDEPENDENT_AMBULATORY_CARE_PROVIDER_SITE_OTHER): Payer: Medicare Other | Admitting: Internal Medicine

## 2016-05-11 ENCOUNTER — Encounter: Payer: Self-pay | Admitting: Internal Medicine

## 2016-05-11 VITALS — BP 140/88 | HR 84 | Temp 97.2°F | Ht <= 58 in | Wt 172.0 lb

## 2016-05-11 DIAGNOSIS — N3946 Mixed incontinence: Secondary | ICD-10-CM | POA: Diagnosis not present

## 2016-05-11 DIAGNOSIS — J40 Bronchitis, not specified as acute or chronic: Secondary | ICD-10-CM | POA: Diagnosis not present

## 2016-05-11 MED ORDER — MIRABEGRON ER 50 MG PO TB24: 50.0000 mg | ORAL_TABLET | Freq: Every day | ORAL | 12 refills | Status: DC

## 2016-05-11 MED ORDER — CEFDINIR 300 MG PO CAPS: 300.0000 mg | ORAL_CAPSULE | Freq: Two times a day (BID) | ORAL | 0 refills | Status: DC

## 2016-05-11 NOTE — Progress Notes (Signed)
Date:  05/11/2016   Name:  Sharon York   DOB:  12-13-1945   MRN:  [id number]   Chief Complaint: Sinus Problem (Pt stated having sinus problem for about 1 week.) Cough  This is a new problem. The current episode started 1 to 4 weeks ago. The problem has been waxing and waning. The problem occurs every few minutes. The cough is non-productive. Associated symptoms include postnasal drip and wheezing. Pertinent negatives include no chest pain, chills, ear pain, fever, sore throat or shortness of breath. The symptoms are aggravated by exercise. She has tried nothing for the symptoms.      Review of Systems  Constitutional: Positive for fatigue. Negative for chills and fever.  HENT: Positive for postnasal drip. Negative for ear pain, sore throat and trouble swallowing.   Respiratory: Positive for cough and wheezing. Negative for chest tightness and shortness of breath.   Cardiovascular: Negative for chest pain and palpitations.    Patient Active Problem List   Diagnosis Date Noted  . Tinea corporis 03/17/2015  . Nocturnal cough 03/17/2015  . Abnormal gait 12/15/2014  . Altered bowel function 12/15/2014  . Blood pressure elevated without history of HTN 12/15/2014  . History of cerebrovascular accident with residual deficit 12/15/2014  . Absence of bladder continence 12/15/2014  . Obstructive sleep apnea of adult 12/15/2014  . Arthritis of shoulder region, degenerative 12/15/2014  . OP (osteoporosis) 12/15/2014  . Allergic rhinitis, seasonal 12/15/2014  . Mixed incontinence 03/11/2013  . 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 Authorizing Provider  alendronate (FOSAMAX) 70 MG tablet TAKE ONE TABLET BY MOUTH ONCE A WEEK 03/20/16  Yes Reubin MilanLaura H Kielan Dreisbach, MD  aspirin 81 MG chewable tablet Chew 1 tablet by mouth daily.   Yes Historical Provider, MD  baclofen (LIORESAL) 10 MG tablet TAKE ONE TABLET BY  MOUTH TWICE DAILY 03/20/16  Yes Reubin MilanLaura H Izzabell Klasen, MD  Calcium Carbonate-Vitamin D (CALCIUM 500 + D) 500-125 MG-UNIT TABS Take by mouth.   Yes Historical Provider, MD  mirabegron ER (MYRBETRIQ) 50 MG TB24 tablet Take 1 tablet (50 mg total) by mouth daily. 03/17/15  Yes Reubin MilanLaura H Marlies Ligman, MD  sulfamethoxazole-trimethoprim (BACTRIM DS,SEPTRA DS) 800-160 MG tablet Take 1 tablet by mouth 2 (two) times daily. 12/23/15  Yes Duanne Limerickeanna C Jones, MD  zinc gluconate 50 MG tablet Take 5 mg by mouth.    Historical Provider, MD    Allergies  Allergen Reactions  . Nitrofurantoin Nausea Only    Past Surgical History:  Procedure Laterality Date  . COLONOSCOPY  2010   normal    Social History  Substance Use Topics  . Smoking status: Never Smoker  . Smokeless tobacco: Not on file  . Alcohol use No     Medication list has been reviewed and updated.   Physical Exam  Constitutional: She is oriented to person, place, and time. She appears well-developed. No distress.  HENT:  Head: Normocephalic and atraumatic.  Right Ear: Tympanic membrane and ear canal normal.  Left Ear: Tympanic membrane and ear canal normal.  Nose: Right sinus exhibits no maxillary sinus tenderness. Left sinus exhibits no maxillary sinus tenderness.  Mouth/Throat: Posterior oropharyngeal erythema present. No posterior oropharyngeal edema.  Cardiovascular: Normal rate and regular rhythm.   Pulmonary/Chest: Effort normal. No respiratory distress. She has wheezes. She has no rhonchi. She has no rales.  Musculoskeletal: Normal range of motion.  Neurological: She is alert  and oriented to person, place, and time.  Skin: Skin is warm and dry. No rash noted.  Psychiatric: She has a normal mood and affect. Her behavior is normal. Thought content normal.  Nursing note and vitals reviewed.   BP 140/88   Pulse 84   Temp 97.2 F (36.2 C)   Ht 4\' 9"  (1.448 m)   Wt 172 lb (78 kg)   SpO2 98%   BMI 37.22 kg/m   Assessment and Plan: 1.  Bronchitis Increase fluids - cefdinir (OMNICEF) 300 MG capsule; Take 1 capsule (300 mg total) by mouth 2 (two) times daily.  Dispense: 20 capsule; Refill: 0  2. Mixed incontinence - mirabegron ER (MYRBETRIQ) 50 MG TB24 tablet; Take 1 tablet (50 mg total) by mouth daily.  Dispense: 30 tablet; Refill: 12   Bari EdwardLaura Baleria Wyman, MD [facility]  05/11/2016

## 2016-05-11 NOTE — Patient Instructions (Signed)
Delsym cough syrup - twice a day for cough

## 2016-06-22 ENCOUNTER — Ambulatory Visit (INDEPENDENT_AMBULATORY_CARE_PROVIDER_SITE_OTHER): Payer: Medicare Other | Admitting: Internal Medicine

## 2016-06-22 ENCOUNTER — Encounter: Payer: Self-pay | Admitting: Internal Medicine

## 2016-06-22 VITALS — BP 128/92 | HR 88 | Temp 97.7°F | Ht <= 58 in | Wt 173.0 lb

## 2016-06-22 DIAGNOSIS — Z23 Encounter for immunization: Secondary | ICD-10-CM

## 2016-06-22 DIAGNOSIS — H1132 Conjunctival hemorrhage, left eye: Secondary | ICD-10-CM | POA: Diagnosis not present

## 2016-06-22 NOTE — Progress Notes (Signed)
Date:  06/22/2016   Name:  Sharon York   DOB:  [birth year]   MRN:  [id number]   Chief Complaint: Eye Problem (Pt stated Lt eye have redness but not painful about 4 days) Eye Problem   The left eye is affected. The current episode started in the past 7 days. The problem occurs constantly. The problem has been unchanged. There was no injury mechanism. The patient is experiencing no pain. There is no known exposure to pink eye. She does not wear contacts. Associated symptoms include eye redness. Pertinent negatives include no blurred vision, double vision, foreign body sensation, itching or nausea. She has tried nothing for the symptoms.      Review of Systems  Constitutional: Negative for chills and fatigue.  Eyes: Positive for redness. Negative for blurred vision, double vision and itching.  Respiratory: Negative for chest tightness and shortness of breath.   Cardiovascular: Negative for chest pain and palpitations.  Gastrointestinal: Negative for nausea.    Patient Active Problem List   Diagnosis Date Noted  . Tinea corporis 03/17/2015  . Nocturnal cough 03/17/2015  . Abnormal gait 12/15/2014  . Altered bowel function 12/15/2014  . Blood pressure elevated without history of HTN 12/15/2014  . History of cerebrovascular accident with residual deficit 12/15/2014  . Absence of bladder continence 12/15/2014  . Obstructive sleep apnea of adult 12/15/2014  . Arthritis of shoulder region, degenerative 12/15/2014  . OP (osteoporosis) 12/15/2014  . Allergic rhinitis, seasonal 12/15/2014  . Mixed incontinence 03/11/2013  . 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 Authorizing Provider  alendronate (FOSAMAX) 70 MG tablet TAKE ONE TABLET BY MOUTH ONCE A WEEK 03/20/16  Yes Reubin MilanLaura H Richardine Peppers, MD  aspirin 81 MG chewable tablet Chew 1 tablet by mouth daily.   Yes Historical Provider, MD  baclofen (LIORESAL)  10 MG tablet TAKE ONE TABLET BY MOUTH TWICE DAILY 03/20/16  Yes Reubin MilanLaura H Lakeesha Fontanilla, MD  Calcium Carbonate-Vitamin D (CALCIUM 500 + D) 500-125 MG-UNIT TABS Take by mouth.   Yes Historical Provider, MD  mirabegron ER (MYRBETRIQ) 50 MG TB24 tablet Take 1 tablet (50 mg total) by mouth daily. 05/11/16  Yes Reubin MilanLaura H Leata Dominy, MD  zinc gluconate 50 MG tablet Take 50 mg by mouth daily.    Historical Provider, MD    Allergies  Allergen Reactions  . Nitrofurantoin Nausea Only    Past Surgical History:  Procedure Laterality Date  . COLONOSCOPY  2010   normal    Social History  Substance Use Topics  . Smoking status: Never Smoker  . Smokeless tobacco: Never Used  . Alcohol use No     Medication list has been reviewed and updated.   Physical Exam  Constitutional: She is oriented to person, place, and time. She appears well-developed. No distress.  HENT:  Head: Normocephalic and atraumatic.  Eyes: EOM are normal. Pupils are equal, round, and reactive to light.    Cardiovascular: Normal rate, regular rhythm and normal heart sounds.   Pulmonary/Chest: Effort normal and breath sounds normal. No respiratory distress.  Musculoskeletal: Normal range of motion.  Neurological: She is alert and oriented to person, place, and time.  Skin: Skin is warm and dry. No rash noted.  Psychiatric: She has a normal mood and affect. Her behavior is normal. Thought content normal.  Nursing note and vitals reviewed.   BP (!) 128/92   Pulse 88   Temp 97.7  F (36.5 C)   Ht 4\' 9"  (1.448 m)   Wt 173 lb (78.5 kg)   SpO2 97%   BMI 37.44 kg/m   Assessment and Plan: 1. Subconjunctival bleed, left Reassurance Over the counter lubrication eye drops if needed  2. Need for pneumococcal vaccination - Pneumococcal conjugate vaccine 13-valent IM   Bari Edward, MD [facility]  06/22/2016

## 2016-06-22 NOTE — Patient Instructions (Addendum)
Subconjunctival Hemorrhage Subconjunctival hemorrhage is bleeding that happens between the white part of your eye (sclera) and the clear membrane that covers the outside of your eye (conjunctiva). There are many tiny blood vessels near the surface of your eye. A subconjunctival hemorrhage happens when one or more of these vessels breaks and bleeds, causing a red patch to appear on your eye. This is similar to a bruise. Depending on the amount of bleeding, the red patch may only cover a small area of your eye or it may cover the entire visible part of the sclera. If a lot of blood collects under the conjunctiva, there may also be swelling. Subconjunctival hemorrhages do not affect your vision or cause pain, but your eye may feel irritated if there is swelling. Subconjunctival hemorrhages usually do not require treatment, and they disappear on their own within two weeks. What are the causes? This condition may be caused by:  Mild trauma, such as rubbing your eye too hard.  Severe trauma or blunt injuries.  Coughing, sneezing, or vomiting.  Straining, such as when lifting a heavy object.  High blood pressure.  Recent eye surgery.  A history of diabetes.  Certain medicines, especially blood thinners (anticoagulants).  Other conditions, such as eye tumors, bleeding disorders, or blood vessel abnormalities. Subconjunctival hemorrhages can happen without an obvious cause. What are the signs or symptoms? Symptoms of this condition include:  A bright red or dark red patch on the white part of the eye.  The red area may spread out to cover a larger area of the eye before it goes away.  The red area may turn brownish-yellow before it goes away.  Swelling.  Mild eye irritation. How is this diagnosed? This condition is diagnosed with a physical exam. If your subconjunctival hemorrhage was caused by trauma, your health care provider may refer you to an eye specialist (ophthalmologist) or  another specialist to check for other injuries. You may have other tests, including:  An eye exam.  A blood pressure check.  Blood tests to check for bleeding disorders. If your subconjunctival hemorrhage was caused by trauma, X-rays or a CT scan may be done to check for other injuries. How is this treated? Usually, no treatment is needed. Your health care provider may recommend eye drops or cold compresses to help with discomfort. Follow these instructions at home:  Take over-the-counter and prescription medicines only as directed by your health care provider.  Use eye drops or cold compresses to help with discomfort as directed by your health care provider.  Avoid activities, things, and environments that may irritate or injure your eye.  Keep all follow-up visits as told by your health care provider. This is important. Contact a health care provider if:  You have pain in your eye.  The bleeding does not go away within 3 weeks.  You keep getting new subconjunctival hemorrhages. Get help right away if:  Your vision changes or you have difficulty seeing.  You suddenly develop severe sensitivity to light.  You develop a severe headache, persistent vomiting, confusion, or abnormal tiredness (lethargy).  Your eye seems to bulge or protrude from your eye socket.  You develop unexplained bruises on your body.  You have unexplained bleeding in another area of your body. This information is not intended to replace advice given to you by your health care provider. Make sure you discuss any questions you have with your health care provider. Document Released: 05/07/2005 Document Revised: 01/01/2016 Document Reviewed: 07/14/2014 Elsevier   Interactive Patient Education  2017 Elsevier Inc. Pneumococcal Conjugate Vaccine (PCV13) What You Need to Know 1. Why get vaccinated? Vaccination can protect both children and adults from pneumococcal disease. Pneumococcal disease is caused by  bacteria that can spread from person to person through close contact. It can cause ear infections, and it can also lead to more serious infections of the:  Lungs (pneumonia),  Blood (bacteremia), and  Covering of the brain and spinal cord (meningitis). Pneumococcal pneumonia is most common among adults. Pneumococcal meningitis can cause deafness and brain damage, and it kills about 1 child in 10 who get it. Anyone can get pneumococcal disease, but children under 14 years of age and adults 24 years and older, people with certain medical conditions, and cigarette smokers are at the highest risk. Before there was a vaccine, the [place] saw:  more than 700 cases of meningitis,  about 13,000 blood infections,  about 5 million ear infections, and  about 200 deaths in children under 5 each year from pneumococcal disease. Since vaccine became available, severe pneumococcal disease in these children has fallen by 88%. About 18,000 older adults die of pneumococcal disease each year in the [place]. Treatment of pneumococcal infections with penicillin and other drugs is not as effective as it used to be, because some strains of the disease have become resistant to these drugs. This makes prevention of the disease, through vaccination, even more important. 2. PCV13 vaccine Pneumococcal conjugate vaccine (called PCV13) protects against 13 types of pneumococcal bacteria. PCV13 is routinely given to children at 2, 4, 6, and 85-74 months of age. It is also recommended for children and adults 34 to 19 years of age with certain health conditions, and for all adults 68 years of age and older. Your doctor can give you details. 3. Some people should not get this vaccine Anyone who has ever had a life-threatening allergic reaction to a dose of this vaccine, to an earlier pneumococcal vaccine called PCV7, or to any vaccine containing diphtheria toxoid (for example, DTaP), should not get PCV13. Anyone  with a severe allergy to any component of PCV13 should not get the vaccine. Tell your doctor if the person being vaccinated has any severe allergies. If the person scheduled for vaccination is not feeling well, your healthcare provider might decide to reschedule the shot on another day. 4. Risks of a vaccine reaction With any medicine, including vaccines, there is a chance of reactions. These are usually mild and go away on their own, but serious reactions are also possible. Problems reported following PCV13 varied by age and dose in the series. The most common problems reported among children were:  About half became drowsy after the shot, had a temporary loss of appetite, or had redness or tenderness where the shot was given.  About 1 out of 3 had swelling where the shot was given.  About 1 out of 3 had a mild fever, and about 1 in 20 had a fever over 102.77F.  Up to about 8 out of 10 became fussy or irritable. Adults have reported pain, redness, and swelling where the shot was given; also mild fever, fatigue, headache, chills, or muscle pain. Young children who get PCV13 along with inactivated flu vaccine at the same time may be at increased risk for seizures caused by fever. Ask your doctor for more information. Problems that could happen after any vaccine:  People sometimes faint after a medical procedure, including vaccination. Sitting or lying down for about  15 minutes can help prevent fainting, and injuries caused by a fall. Tell your doctor if you feel dizzy, or have vision changes or ringing in the ears.  Some older children and adults get severe pain in the shoulder and have difficulty moving the arm where a shot was given. This happens very rarely.  Any medication can cause a severe allergic reaction. Such reactions from a vaccine are very rare, estimated at about 1 in a million doses, and would happen within a few minutes to a few hours after the vaccination. As with any medicine,  there is a very small chance of a vaccine causing a serious injury or death. The safety of vaccines is always being monitored. For more information, visit: http://floyd.org/www.cdc.gov/vaccinesafety/ 5. What if there is a serious reaction? What should I look for? Look for anything that concerns you, such as signs of a severe allergic reaction, very high fever, or unusual behavior. Signs of a severe allergic reaction can include hives, swelling of the face and throat, difficulty breathing, a fast heartbeat, dizziness, and weakness-usually within a few minutes to a few hours after the vaccination. What should I do?  If you think it is a severe allergic reaction or other emergency that can't wait, call 9-1-1 or get the person to the nearest hospital. Otherwise, call your doctor.  Reactions should be reported to the Vaccine Adverse Event Reporting System (VAERS). Your doctor should file this report, or you can do it yourself through the VAERS web site at www.vaers.LAgents.nohhs.gov, or by calling 1-3106916767.  VAERS does not give medical advice. 6. The National Vaccine Injury Compensation Program The [employer] Vaccine Injury Compensation Program (VICP) is a federal program that was created to compensate people who may have been injured by certain vaccines. Persons who believe they may have been injured by a vaccine can learn about the program and about filing a claim by calling 1-502-863-4479 or visiting the VICP website at SpiritualWord.atwww.hrsa.gov/vaccinecompensation. There is a time limit to file a claim for compensation. 7. How can I learn more?  Ask your healthcare provider. He or she can give you the vaccine package insert or suggest other sources of information.  Call your local or state health department.  Contact the Centers for Disease Control and Prevention (CDC):  Call 641-825-28621-(930) 603-7501 (1-800-CDC-INFO) or  Visit CDC's website at PicCapture.uywww.cdc.gov/vaccines Vaccine Information Statement, PCV13 Vaccine (03/25/2014) This  information is not intended to replace advice given to you by your health care provider. Make sure you discuss any questions you have with your health care provider. Document Released: 03/04/2006 Document Revised: 01/26/2016 Document Reviewed: 01/26/2016 Elsevier Interactive Patient Education  2017 [employer]

## 2016-10-26 ENCOUNTER — Telehealth: Payer: Self-pay | Admitting: Internal Medicine

## 2016-10-26 NOTE — Telephone Encounter (Signed)
Pt requesting for new wheelchair-pt stated she have the wheelchair over 5 years and is not working properly.

## 2016-10-26 NOTE — Telephone Encounter (Signed)
Called pt back to notified pt about Dr. Judithann GravesBerglund instructions

## 2016-10-26 NOTE — Telephone Encounter (Signed)
She needs to find a supplier of the wheelchair she wants.  Then the company will send me a "face to face" form and she will need to come for an OV just to complete the form.  Then I fax it to the company and we will find out if Medicare will pay for a new chair.

## 2016-11-01 DIAGNOSIS — Z7982 Long term (current) use of aspirin: Secondary | ICD-10-CM | POA: Diagnosis not present

## 2016-11-01 DIAGNOSIS — S0990XA Unspecified injury of head, initial encounter: Secondary | ICD-10-CM | POA: Diagnosis not present

## 2016-11-01 DIAGNOSIS — I69344 Monoplegia of lower limb following cerebral infarction affecting left non-dominant side: Secondary | ICD-10-CM | POA: Diagnosis not present

## 2016-11-01 DIAGNOSIS — W19XXXA Unspecified fall, initial encounter: Secondary | ICD-10-CM | POA: Diagnosis not present

## 2016-11-01 DIAGNOSIS — S0003XA Contusion of scalp, initial encounter: Secondary | ICD-10-CM | POA: Diagnosis not present

## 2016-11-01 DIAGNOSIS — I69334 Monoplegia of upper limb following cerebral infarction affecting left non-dominant side: Secondary | ICD-10-CM | POA: Diagnosis not present

## 2016-11-01 DIAGNOSIS — R531 Weakness: Secondary | ICD-10-CM | POA: Diagnosis not present

## 2016-11-01 DIAGNOSIS — S0001XA Abrasion of scalp, initial encounter: Secondary | ICD-10-CM | POA: Diagnosis not present

## 2016-11-01 DIAGNOSIS — G9389 Other specified disorders of brain: Secondary | ICD-10-CM | POA: Diagnosis not present

## 2016-11-01 DIAGNOSIS — R29818 Other symptoms and signs involving the nervous system: Secondary | ICD-10-CM | POA: Diagnosis not present

## 2016-11-01 DIAGNOSIS — Z8673 Personal history of transient ischemic attack (TIA), and cerebral infarction without residual deficits: Secondary | ICD-10-CM | POA: Diagnosis not present

## 2016-11-01 DIAGNOSIS — Z79899 Other long term (current) drug therapy: Secondary | ICD-10-CM | POA: Diagnosis not present

## 2016-11-01 DIAGNOSIS — Z7983 Long term (current) use of bisphosphonates: Secondary | ICD-10-CM | POA: Diagnosis not present

## 2016-11-01 DIAGNOSIS — M810 Age-related osteoporosis without current pathological fracture: Secondary | ICD-10-CM | POA: Diagnosis not present

## 2016-11-05 ENCOUNTER — Ambulatory Visit (INDEPENDENT_AMBULATORY_CARE_PROVIDER_SITE_OTHER): Payer: Medicare Other | Admitting: Internal Medicine

## 2016-11-05 ENCOUNTER — Encounter: Payer: Self-pay | Admitting: Internal Medicine

## 2016-11-05 VITALS — BP 178/94 | HR 75 | Ht <= 58 in | Wt 169.0 lb

## 2016-11-05 DIAGNOSIS — G479 Sleep disorder, unspecified: Secondary | ICD-10-CM | POA: Diagnosis not present

## 2016-11-05 DIAGNOSIS — S0003XD Contusion of scalp, subsequent encounter: Secondary | ICD-10-CM

## 2016-11-05 DIAGNOSIS — Z1231 Encounter for screening mammogram for malignant neoplasm of breast: Secondary | ICD-10-CM | POA: Diagnosis not present

## 2016-11-05 DIAGNOSIS — R81 Glycosuria: Secondary | ICD-10-CM | POA: Diagnosis not present

## 2016-11-05 DIAGNOSIS — R7309 Other abnormal glucose: Secondary | ICD-10-CM | POA: Diagnosis not present

## 2016-11-05 NOTE — Patient Instructions (Signed)

## 2016-11-05 NOTE — Progress Notes (Signed)
Date:  11/05/2016   Name:  Sharon York   DOB:  05/29/1945   MRN:  [id number]   Chief Complaint: Sleep Apnea (Wakes up all through the night. Wants to discuss sleep study and possible CPAP.) and Glucosuria (Did UA with Insurance nurse and came back with 2+ glucose in urine. Told to come be checked out by PCP. Using the bathroom frequently and has dry mouth at times.) Had glucose on urine dipstick and needs to be checked for diabetes. Sleep disturbance - disrupted sleep and morning fatigue.  Daytime somnolence.  No morning headaches.  No elevated blood pressure. She has had a sleep study in the distant past but does not recall the result. Fall - fell at home last week and struck back of head on concrete car port.  She did not have LOC.  Was seen in ER and CT was negative.  Neuro exam benign.  She was released home with instructions.  She is taking tylenol for mild headache but denies dizziness, blurred vision, nausea, confusion.  Review of Systems  Constitutional: Negative for unexpected weight change.  Eyes: Negative for visual disturbance.  Respiratory: Negative for chest tightness, shortness of breath and wheezing.   Cardiovascular: Negative for chest pain and palpitations.  Gastrointestinal: Negative for abdominal pain and vomiting.  Endocrine: Negative for polydipsia and polyuria.  Musculoskeletal: Positive for arthralgias and gait problem.  Neurological: Positive for headaches. Negative for dizziness, syncope, light-headedness and numbness.  Psychiatric/Behavioral: Positive for sleep disturbance. Negative for decreased concentration.    Patient Active Problem List   Diagnosis Date Noted  . Tinea corporis 03/17/2015  . Nocturnal cough 03/17/2015  . Abnormal gait 12/15/2014  . Altered bowel function 12/15/2014  . Blood pressure elevated without history of HTN 12/15/2014  . History of cerebrovascular accident with residual deficit 12/15/2014  . Absence of bladder continence  12/15/2014  . Obstructive sleep apnea of adult 12/15/2014  . Arthritis of shoulder region, degenerative 12/15/2014  . OP (osteoporosis) 12/15/2014  . Allergic rhinitis, seasonal 12/15/2014  . Mixed incontinence 03/11/2013  . 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 Authorizing Provider  alendronate (FOSAMAX) 70 MG tablet TAKE ONE TABLET BY MOUTH ONCE A WEEK 03/20/16   Reubin MilanBerglund, Ares Tegtmeyer H, MD  aspirin 81 MG chewable tablet Chew 1 tablet by mouth daily.    [provider]  baclofen (LIORESAL) 10 MG tablet TAKE ONE TABLET BY MOUTH TWICE DAILY 03/20/16   Reubin MilanBerglund, Karmello Abercrombie H, MD  Calcium Carbonate-Vitamin D (CALCIUM 500 + D) 500-125 MG-UNIT TABS Take by mouth.    [provider]  mirabegron ER (MYRBETRIQ) 50 MG TB24 tablet Take 1 tablet (50 mg total) by mouth daily. 05/11/16   Reubin MilanBerglund, Jaycie Kregel H, MD  zinc gluconate 50 MG tablet Take 50 mg by mouth daily.    [provider]    Allergies  Allergen Reactions  . Nitrofurantoin Nausea Only    Past Surgical History:  Procedure Laterality Date  . COLONOSCOPY  2010   normal    Social History  Substance Use Topics  . Smoking status: Never Smoker  . Smokeless tobacco: Never Used  . Alcohol use No     Medication list has been reviewed and updated.   Physical Exam  Constitutional: She is oriented to person, place, and time. She appears well-developed. No distress.  HENT:  Head: Normocephalic and atraumatic.  Mild tenderness posterior scalp with no  lesion or hematoma noted  Cardiovascular: Normal rate, regular rhythm and normal heart sounds.   Pulmonary/Chest: Effort normal and breath sounds normal. No respiratory distress. She has no wheezes.  Musculoskeletal: Normal range of motion.  Neurological: She is alert and oriented to person, place, and time.  Persistent left sided weakness from prior stroke Ambulating with cane    Skin: Skin is warm and dry. No rash noted.  Psychiatric: She has a normal mood and affect. Her behavior is normal. Thought content normal.  Nursing note and vitals reviewed.   Pulse 75   Ht 4\' 9"  (1.448 m)   Wt 169 lb (76.7 kg)   SpO2 96%   BMI 36.57 kg/m   Assessment and Plan: 1. Sleep disorder - Nocturnal polysomnography (NPSG); Future  2. Glucosuria Rule out DM with A1C - Hemoglobin A1c  3. Scalp hematoma, subsequent encounter Resolving with mild headache Continue tylenol as needed  4. Encounter for screening mammogram for breast cancer - MM DIGITAL SCREENING BILATERAL; Future   No orders of the defined types were placed in this encounter.   Bari Edward, MD [facility]  11/05/2016

## 2016-11-06 ENCOUNTER — Other Ambulatory Visit: Payer: Self-pay | Admitting: Internal Medicine

## 2016-11-06 DIAGNOSIS — IMO0001 Reserved for inherently not codable concepts without codable children: Secondary | ICD-10-CM

## 2016-11-06 DIAGNOSIS — E1165 Type 2 diabetes mellitus with hyperglycemia: Principal | ICD-10-CM

## 2016-11-06 LAB — HEMOGLOBIN A1C
Est. average glucose Bld gHb Est-mCnc: 189 mg/dL
Hgb A1c MFr Bld: 8.2 % — ABNORMAL HIGH (ref 4.8–5.6)

## 2016-11-06 MED ORDER — METFORMIN HCL ER 500 MG PO TB24: 500.0000 mg | ORAL_TABLET | Freq: Every day | ORAL | 2 refills | Status: DC

## 2016-11-19 ENCOUNTER — Ambulatory Visit: Payer: Medicare Other | Admitting: *Deleted

## 2016-11-22 ENCOUNTER — Encounter: Payer: Medicare Other | Attending: Internal Medicine | Admitting: Dietician

## 2016-11-22 ENCOUNTER — Encounter: Payer: Self-pay | Admitting: Dietician

## 2016-11-22 VITALS — Ht <= 58 in | Wt 169.0 lb

## 2016-11-22 DIAGNOSIS — Z6836 Body mass index (BMI) 36.0-36.9, adult: Secondary | ICD-10-CM | POA: Diagnosis not present

## 2016-11-22 DIAGNOSIS — Z713 Dietary counseling and surveillance: Secondary | ICD-10-CM | POA: Insufficient documentation

## 2016-11-22 DIAGNOSIS — E119 Type 2 diabetes mellitus without complications: Secondary | ICD-10-CM | POA: Insufficient documentation

## 2016-11-22 NOTE — Progress Notes (Signed)
Diabetes Self-Management Education  Visit Type: First/Initial  Appt. Start Time: 0930 Appt. End Time: 1130  11/22/2016  Sharon York, identified by name and date of birth, is a 71 y.o. female with a diagnosis of Diabetes: Type 2.   ASSESSMENT  Height _0  (1.448 m), weight 169 lb (76.7 kg). Body mass index is 36.57 kg/m.      Diabetes Self-Management Education - 11/22/16 1258      Visit Information   Visit Type First/Initial     Initial Visit   Diabetes Type Type 2     Health Coping   How would you rate your overall health? Good     Psychosocial Assessment   Patient Belief/Attitude about Diabetes Motivated to manage diabetes   Self-care barriers Unsteady gait/risk for falls   Other persons present Patient   Preferred Learning Style Auditory   Learning Readiness Ready     Pre-Education Assessment   Patient understands the diabetes disease and treatment process. Needs Instruction   Patient understands incorporating nutritional management into lifestyle. Needs Instruction   Patient undertands incorporating physical activity into lifestyle. Needs Instruction   Patient understands using medications safely. Needs Instruction   Patient understands monitoring blood glucose, interpreting and using results Needs Instruction   Patient understands prevention, detection, and treatment of acute complications. Needs Instruction   Patient understands prevention, detection, and treatment of chronic complications. Needs Instruction   Patient understands how to develop strategies to address psychosocial issues. Needs Instruction   Patient understands how to develop strategies to promote health/change behavior. Needs Instruction     Complications   Last HgB A1C per patient/outside source 8.2 %   How often do you check your blood sugar? 0 times/day (not testing)   Have you had a dilated eye exam in the past 12 months? Yes   Have you had a dental exam in the past 12 months? No   Are  you checking your feet? Yes   How many days per week are you checking your feet? 7     Dietary Intake   Breakfast boiled egg, [place] sausage sometimes with 1/2c. oatmeal or bran cereal, sometimes cheese   Snack (morning) 10am fruit with cinnamon   Lunch sliced [place] on bread, cherry tomatoes   Snack (afternoon) usually popcorn, stopped recently    Dinner [place] sandwich with 1/2c. black beans or 1c. string beans   Snack (evening) fruit with cinnamon   Beverage(s) filtered water, occasionally green tea. Has stopped drinking sodas recently.      Exercise   Exercise Type Light (walking / raking leaves)  walking, water exercise   How many days per week to you exercise? 7   How many minutes per day do you exercise? 45   Total minutes per week of exercise 315     Patient Education   Previous Diabetes Education No   Disease state  Definition of diabetes, type 1 and 2, and the diagnosis of diabetes   Nutrition management  Role of diet in the treatment of diabetes and the relationship between the three main macronutrients and blood glucose level   Physical activity and exercise  Role of exercise on diabetes management, blood pressure control and cardiac health.   Monitoring Taught/evaluated SMBG meter.;Purpose and frequency of SMBG.;Other (comment)  strategies for completing BG testing with use of one hand/arm   Acute complications Taught treatment of hypoglycemia - the 15 rule.   Chronic complications Relationship between chronic complications and blood glucose control  Individualized Goals (developed by patient)   Nutrition General guidelines for healthy choices and portions discussed   Physical Activity Exercise 5-7 days per week   Monitoring  test my blood glucose as discussed     Outcomes   Expected Outcomes Demonstrated interest in learning. Expect positive outcomes   Future DMSE 2 wks  scheduled for class series beginning 11/29/16      Individualized Plan for Diabetes  Self-Management Training:   Learning Objective:  Patient will have a greater understanding of diabetes self-management. Patient education plan is to attend individual and/or group sessions per assessed needs and concerns.   Plan:   Patient Instructions  (as listed on "Goals for Initial Appointment" which was given to patient)  Check blood sugars 2x/day before breakfast, and 2hrs after supper, every other day  Continue walking for 30-60 minutes, 5-7 days a week  Continue to avoid sugar-sweetened drinks.  Continue to eat 3 meals a day and 1-3 snacks a day  Bring blood sugar record with you to your class  Call doctor for prescription for : strips and lancets (drum) for Accu-Chek Guide monitor.  Bring a snack with you when you exercise or run errands.  Return for classes on 11/29/16, 12/06/16, and 12/13/16 -- come at 8:45 to check in, class will begin at 9am.    Expected Outcomes:  Demonstrated interest in learning. Expect positive outcomes  Education material provided: General Diet Guidelines; Plate planner with food lists; blood glucose record; Accu Chek Guide meter kit; Goals for Initial Appointment  If problems or questions, patient to contact team via:  Phone  Future DSME appointment: 2 wks (scheduled for class series beginning 11/29/16)

## 2016-11-22 NOTE — Patient Instructions (Signed)
(  as listed on "Goals for Initial Appointment" which was given to patient)  Check blood sugars 2x/day before breakfast, and 2hrs after supper, every other day  Continue walking for 30-60 minutes, 5-7 days a week  Continue to avoid sugar-sweetened drinks.  Continue to eat 3 meals a day and 1-3 snacks a day  Bring blood sugar record with you to your class  Call doctor for prescription for : strips and lancets (drum) for Accu-Chek Guide monitor.  Bring a snack with you when you exercise or run errands.  Return for classes on 11/29/16, 12/06/16, and 12/13/16 -- come at 8:45 to check in, class will begin at 9am.

## 2016-11-26 ENCOUNTER — Telehealth: Payer: Self-pay | Admitting: Dietician

## 2016-11-26 ENCOUNTER — Other Ambulatory Visit: Payer: Self-pay | Admitting: Internal Medicine

## 2016-11-26 MED ORDER — GLUCOSE BLOOD VI STRP: ORAL_STRIP | 12 refills | Status: DC

## 2016-11-26 MED ORDER — ACCU-CHEK FASTCLIX LANCETS MISC: 1.0000 | Freq: Two times a day (BID) | 3 refills | Status: DC

## 2016-11-26 NOTE — Telephone Encounter (Addendum)
Returned voicemail message Sharon York had left on Friday afternoon, regarding having trouble with BG testing. She stated she wasted multiple strips before getting a result.  In speaking with her today, she states her reading from the one test was 210mg /dl.  She is making sure the meter has not shut off prior to applying blood sample, but states she had likely been placing the blood on top of the strip rather than on the side. Discussed capillary action of strip and reviewed need to place sample on side edge of strip. Also reviewed need for adequate blood on finger prior to applying to strip. Will review testing procedure when Sharon York returns for class on 11/29/16.  She will call MD office today for prescription for strips and lancets.

## 2016-11-29 ENCOUNTER — Encounter: Payer: Self-pay | Admitting: Dietician

## 2016-11-29 ENCOUNTER — Encounter: Payer: Medicare Other | Admitting: Dietician

## 2016-11-29 VITALS — Ht <= 58 in | Wt 162.4 lb

## 2016-11-29 DIAGNOSIS — E119 Type 2 diabetes mellitus without complications: Secondary | ICD-10-CM

## 2016-11-29 DIAGNOSIS — Z713 Dietary counseling and surveillance: Secondary | ICD-10-CM | POA: Diagnosis not present

## 2016-11-29 NOTE — Progress Notes (Signed)
Reviewed BG testing procedure with patient, particularly strategies for obtaining adequate blood sample without use of her left arm. She demonstrates understanding of the procedure.

## 2016-11-29 NOTE — Progress Notes (Signed)

## 2016-12-04 ENCOUNTER — Other Ambulatory Visit: Payer: Self-pay

## 2016-12-04 ENCOUNTER — Telehealth: Payer: Self-pay

## 2016-12-04 NOTE — Telephone Encounter (Signed)
Pt called wanting a PT order out in. I tried to call [phone] and the "mailbox is full". So, I called the number on file and an answering machine came on- Left message stating that we have tried to call the PT group twice with no response. Also, we have no way of just putting an order in the computer. We will wait to hear from the physical therapists and / or she can call them and ask them to fax a request for you to look at.

## 2016-12-06 ENCOUNTER — Encounter: Payer: Medicare Other | Admitting: *Deleted

## 2016-12-06 ENCOUNTER — Encounter: Payer: Self-pay | Admitting: *Deleted

## 2016-12-06 VITALS — Wt 163.3 lb

## 2016-12-06 DIAGNOSIS — E119 Type 2 diabetes mellitus without complications: Secondary | ICD-10-CM | POA: Diagnosis not present

## 2016-12-06 DIAGNOSIS — Z713 Dietary counseling and surveillance: Secondary | ICD-10-CM | POA: Diagnosis not present

## 2016-12-06 NOTE — Progress Notes (Signed)
Appt. Start Time: 0900 Appt. End Time: 1130  Class 2 Nutritional Management - identify sources of carbohydrate, protein and fat; plan balanced meals; estimate servings of carbohydrates in meals  Psychosocial - identify DM as a source of stress; state the effects of stress on BG control  Exercise - describe the effects of exercise on blood glucose and importance of regular exercise in controlling diabetes; state a plan for personal exercise; verbalize contraindications for exercise  Self-Monitoring - state importance of SMBG; use SMBG results to effectively manage diabetes; identify importance of regular HbA1C testing and goals for results  Acute Complications - recognize hyperglycemia and hypoglycemia with causes and effects; identify blood glucose results as high, low or in control; list steps in treating and preventing high and low blood glucose  Sick Day Guidelines: state appropriate measure to manage blood glucose when ill (need for meds, HBGM plan, when to call physician, need for fluids)  Chronic Complications/Foot, Skin, Eye Dental Care - identify possible long-term complications of diabetes (retinopathy, neuropathy, nephropathy, cardiovascular disease, infections); explain steps in prevention and treatment of chronic complications; state importance of daily self-foot exams; describe how to examine feet and what to look for; explain appropriate eye and dental care  Lifestyle Changes/Goals - state benefits of making appropriate lifestyle changes; identify habits that need to change (meals, tobacco, alcohol); identify strategies to reduce risk factors for personal health  Pregnancy/Sexual Health - state importance of good blood glucose control in preventing sexual problems (impotence, vaginal dryness, infections, loss of desire)  Teaching Materials Used: Class 2 Slide Packet A1C Pamphlet Foot Care Literature Kidney Test Handout Quick and "Balanced" Meal Ideas Carb Counting and Meal  Planning Book Goals for Class 2

## 2016-12-07 ENCOUNTER — Other Ambulatory Visit: Payer: Self-pay | Admitting: Internal Medicine

## 2016-12-07 ENCOUNTER — Telehealth: Payer: Self-pay

## 2016-12-07 DIAGNOSIS — I6990 Unspecified sequelae of unspecified cerebrovascular disease: Secondary | ICD-10-CM

## 2016-12-07 DIAGNOSIS — M810 Age-related osteoporosis without current pathological fracture: Secondary | ICD-10-CM

## 2016-12-07 DIAGNOSIS — R269 Unspecified abnormalities of gait and mobility: Secondary | ICD-10-CM

## 2016-12-07 NOTE — Telephone Encounter (Signed)
[facility] called- Eunice BlaseDebbie- stating she wanted to see if we could send over a prescription with pt name and DOB and approving her for Aquatic Physical Therapy for the pt. Their phone number is [phone] and their fax is [phone] if we approve. Prescription needs to state: Evaluation and treatment for aquatic therapy PRN  And needs to state diagnosis code. I called pt to find out reason for therapy and awaiting call to find out.

## 2016-12-07 NOTE — Telephone Encounter (Signed)
Order printed,. Please fax

## 2016-12-10 ENCOUNTER — Ambulatory Visit
Admission: RE | Admit: 2016-12-10 | Discharge: 2016-12-10 | Disposition: A | Discharge status: Home / Self Care | Payer: Medicare Other | Source: Ambulatory Visit | Attending: Internal Medicine | Admitting: Internal Medicine

## 2016-12-10 ENCOUNTER — Telehealth: Payer: Self-pay | Admitting: Internal Medicine

## 2016-12-10 DIAGNOSIS — Z1231 Encounter for screening mammogram for malignant neoplasm of breast: Secondary | ICD-10-CM | POA: Diagnosis not present

## 2016-12-10 NOTE — Telephone Encounter (Signed)
Pt needs refill for metFORMIN (GLUCOPHAGE-XR) 500 MG  Sent to [facility] in [place]. Please Advice only had enough for today will need for tomorrow morning.

## 2016-12-10 NOTE — Telephone Encounter (Signed)
Pt has two more refills on medication at [facility] [place]. Please call her and let her know. If questions, send her to my VM. Thanks.

## 2016-12-13 ENCOUNTER — Encounter: Payer: Self-pay | Admitting: Dietician

## 2016-12-13 ENCOUNTER — Encounter: Payer: Medicare Other | Admitting: Dietician

## 2016-12-13 VITALS — BP 144/80 | Ht <= 58 in | Wt 161.4 lb

## 2016-12-13 DIAGNOSIS — E119 Type 2 diabetes mellitus without complications: Secondary | ICD-10-CM | POA: Diagnosis not present

## 2016-12-13 DIAGNOSIS — Z713 Dietary counseling and surveillance: Secondary | ICD-10-CM | POA: Diagnosis not present

## 2016-12-13 NOTE — Progress Notes (Signed)

## 2016-12-14 ENCOUNTER — Other Ambulatory Visit: Payer: Self-pay

## 2016-12-14 DIAGNOSIS — IMO0001 Reserved for inherently not codable concepts without codable children: Secondary | ICD-10-CM

## 2016-12-14 DIAGNOSIS — E1165 Type 2 diabetes mellitus with hyperglycemia: Principal | ICD-10-CM

## 2016-12-14 MED ORDER — METFORMIN HCL ER 500 MG PO TB24: 500.0000 mg | ORAL_TABLET | Freq: Every day | ORAL | 0 refills | Status: DC

## 2016-12-19 DIAGNOSIS — E119 Type 2 diabetes mellitus without complications: Secondary | ICD-10-CM | POA: Diagnosis not present

## 2016-12-19 DIAGNOSIS — H2513 Age-related nuclear cataract, bilateral: Secondary | ICD-10-CM | POA: Diagnosis not present

## 2016-12-19 LAB — HM DIABETES EYE EXAM

## 2017-01-02 ENCOUNTER — Telehealth: Payer: Self-pay

## 2017-01-02 ENCOUNTER — Ambulatory Visit: Payer: Medicare Other | Attending: Neurology

## 2017-01-02 DIAGNOSIS — Z6834 Body mass index (BMI) 34.0-34.9, adult: Secondary | ICD-10-CM | POA: Insufficient documentation

## 2017-01-02 DIAGNOSIS — F5101 Primary insomnia: Secondary | ICD-10-CM | POA: Diagnosis not present

## 2017-01-02 DIAGNOSIS — R32 Unspecified urinary incontinence: Secondary | ICD-10-CM

## 2017-01-02 DIAGNOSIS — I10 Essential (primary) hypertension: Secondary | ICD-10-CM | POA: Insufficient documentation

## 2017-01-02 DIAGNOSIS — E669 Obesity, unspecified: Secondary | ICD-10-CM | POA: Diagnosis not present

## 2017-01-02 DIAGNOSIS — N3946 Mixed incontinence: Secondary | ICD-10-CM

## 2017-01-02 DIAGNOSIS — G4733 Obstructive sleep apnea (adult) (pediatric): Secondary | ICD-10-CM | POA: Diagnosis present

## 2017-01-02 DIAGNOSIS — R0683 Snoring: Secondary | ICD-10-CM | POA: Insufficient documentation

## 2017-01-02 DIAGNOSIS — G4730 Sleep apnea, unspecified: Secondary | ICD-10-CM | POA: Diagnosis not present

## 2017-01-02 NOTE — Telephone Encounter (Signed)
Patient called stating she is being moved to a different urologist- Dr. Vanna ScotlandAshley York. States she needs referral to see him. (UROLOGY). Referral placed and pt called and informed.

## 2017-01-03 ENCOUNTER — Encounter: Payer: Self-pay | Admitting: Dietician

## 2017-01-03 NOTE — Progress Notes (Signed)
Patient has completed comprehensive diabetes education. Sent discharge letter to MD.

## 2017-01-07 ENCOUNTER — Telehealth: Payer: Self-pay

## 2017-01-07 ENCOUNTER — Ambulatory Visit (INDEPENDENT_AMBULATORY_CARE_PROVIDER_SITE_OTHER): Payer: Medicare Other

## 2017-01-07 VITALS — BP 150/82 | HR 64 | Temp 98.3°F | Resp 16 | Ht <= 58 in | Wt 160.6 lb

## 2017-01-07 DIAGNOSIS — Z0000 Encounter for general adult medical examination without abnormal findings: Secondary | ICD-10-CM

## 2017-01-07 NOTE — Progress Notes (Signed)
Subjective:   Sharon York is a 71 y.o. female who presents for Medicare Annual (Subsequent) preventive examination.  Review of Systems:  Cardiac Risk Factors include: advanced age (>46men, >55 women);diabetes mellitus;hypertension;obesity (BMI >30kg/m2)     Objective:     Vitals: BP (!) 150/82 (BP Location: Left Arm, Patient Position: Sitting)   Pulse 64   Temp 98.3 F (36.8 C)   Resp 16   Ht 4\' 9"  (1.448 m)   Wt 160 lb 9.6 oz (72.8 kg)   BMI 34.75 kg/m   Body mass index is 34.75 kg/m.   Tobacco History  Smoking Status  . Never Smoker  Smokeless Tobacco  . Never Used     Counseling given: Not Answered   Past Medical History:  Diagnosis Date  . Diabetes mellitus without complication (HCC)   . Osteoporosis   . Overactive bladder   . Stroke [facility])    Past Surgical History:  Procedure Laterality Date  . CESAREAN SECTION  1975  . COLONOSCOPY  2010   normal   Family History  Problem Relation Age of Onset  . Diabetes Mother   . Thyroid disease Mother   . Breast cancer Maternal Aunt   . Breast cancer Cousin        mat cousin   History  Sexual Activity  . Sexual activity: No    Outpatient Encounter Prescriptions as of 01/07/2017  Medication Sig  . ACCU-CHEK FASTCLIX LANCETS MISC 1 each by Does not apply route 2 (two) times daily.  Marland Kitchen aspirin 81 MG chewable tablet Chew 1 tablet by mouth daily.  . baclofen (LIORESAL) 10 MG tablet TAKE ONE TABLET BY MOUTH TWICE DAILY  . Calcium Carbonate-Vitamin D (CALCIUM 500 + D) 500-125 MG-UNIT TABS Take by mouth.  Marland Kitchen glucose blood (ACCU-CHEK GUIDE) test strip Test BS twice a day  . metFORMIN (GLUCOPHAGE-XR) 500 MG 24 hr tablet Take 1 tablet (500 mg total) by mouth daily with breakfast.  . mirabegron ER (MYRBETRIQ) 50 MG TB24 tablet Take 1 tablet (50 mg total) by mouth daily.  . Multiple Vitamins-Minerals (MULTIVITAMIN ADULT PO) Take by mouth.  Marland Kitchen alendronate (FOSAMAX) 70 MG tablet TAKE ONE TABLET BY MOUTH ONCE A WEEK  (Patient not taking: Reported on 01/07/2017)   No facility-administered encounter medications on file as of 01/07/2017.     Activities of Daily Living In your present state of health, do you have any difficulty performing the following activities: 01/07/2017  Hearing? Y  Vision? N  Difficulty concentrating or making decisions? N  Walking or climbing stairs? Y  Dressing or bathing? N  Doing errands, shopping? N  Preparing Food and eating ? N  Using the Toilet? N  In the past six months, have you accidently leaked urine? Y  Comment depends  Do you have problems with loss of bowel control? N  Managing your Medications? N  Managing your Finances? N  Housekeeping or managing your Housekeeping? N  Some recent data might be hidden    Patient Care Team: Reubin Milan, MD as PCP - General (Family Medicine)    Assessment:     Exercise Activities and Dietary recommendations Current Exercise Habits: Home exercise routine, Type of exercise: walking, Time (Minutes): 30, Frequency (Times/Week): 6, Weekly Exercise (Minutes/Week): 180, Intensity: Mild, Exercise limited by: orthopedic condition(s)  Goals    None     Fall Risk Fall Risk  01/07/2017 12/13/2016 12/06/2016 11/29/2016 11/22/2016  Falls in the past year? Yes (No Data) Yes Yes Yes  Comment - no falls since previous visit No falls since last visit no falls since 10/2016 -  Number falls in past yr: 1 - - - 1  Injury with Fall? Yes - - - Yes  Risk Factor Category  - - - - High Fall Risk  Risk for fall due to : - - - - History of fall(s);Impaired mobility  Risk for fall due to: Comment - - - - CVA 1995  Follow up Falls prevention discussed - - - Education provided   Depression Screen PHQ 2/9 Scores 01/07/2017 11/22/2016 11/28/2015 01/05/2015  PHQ - 2 Score 0 0 0 0     Cognitive Function     6CIT Screen 01/07/2017  What Year? 0 points  What month? 0 points  What time? 0 points  Count back from 20 0 points  Months in reverse 0  points  Repeat phrase 0 points  Total Score 0    Immunization History  Administered Date(s) Administered  . Influenza,inj,Quad PF,36+ Mos 02/18/2015  . Pneumococcal Conjugate-13 06/22/2016   Screening Tests Health Maintenance  Topic Date Due  . INFLUENZA VACCINE  12/19/2016  . FOOT EXAM  02/05/2017 (Originally 07/10/1955)  . URINE MICROALBUMIN  02/05/2017 (Originally 07/10/1955)  . Hepatitis C Screening  02/05/2017 (Originally 03-01-1946)  . TETANUS/TDAP  01/19/2018 (Originally 07[birth year]10/1964)  . HEMOGLOBIN A1C  05/07/2017  . COLONOSCOPY  05/22/2017  . PNA vac Low Risk Adult (2 of 2 - PPSV23) 06/22/2017  . OPHTHALMOLOGY EXAM  12/19/2017  . MAMMOGRAM  12/11/2018  . DEXA SCAN  Completed      Plan:     I have personally reviewed and addressed the Medicare Annual Wellness questionnaire and have noted the following in the patient's chart:  A. Medical and social history B. Use of alcohol, tobacco or illicit drugs  C. Current medications and supplements D. Functional ability and status E.  Nutritional status F.  Physical activity G. Advance directives H. List of other physicians I.  Hospitalizations, surgeries, and ER visits in previous 12 months J.  Vitals K. Screenings such as hearing and vision if needed, cognitive and depression L. Referrals and appointments   In addition, I have reviewed and discussed with patient certain preventive protocols, quality metrics, and best practice recommendations. A written personalized care plan for preventive services as well as general preventive health recommendations were provided to patient.   Signed,  Marin Roberts, LPN Nurse Health Advisor   MD Recommendations: due for diabetic foot exam at next visit

## 2017-01-07 NOTE — Patient Instructions (Signed)
[name] , Thank you for taking time to come for your Medicare Wellness Visit. I appreciate your ongoing commitment to your health goals. Please review the following plan we discussed and let me know if I can assist you in the future.   Screening recommendations/referrals: Colonoscopy: completed 05/28/2007, due 05/27/2017 Mammogram: completed 12/10/2016 Bone Density: completed 02/06/2012 Recommended yearly ophthalmology/optometry visit for glaucoma screening and checkup Recommended yearly dental visit for hygiene and checkup  Vaccinations: Influenza vaccine: up to date, due 01/2017 Pneumococcal vaccine: pneumovax 23 due 06/2017 Tdap vaccine: due, check with your insurance company for coverage Shingles vaccine: due, check with your insurance company for coverage  Advanced directives: Please bring a copy of your health care power of attorney and living will to the office at your convenience.  Conditions/risks identified: Recommend drinking at least 6-8 glasses of water a day  Next appointment: Follow up on 02/06/2017 at 3:15pm with Dr.Berglund. Follow up in one year for your annual wellness exam.    Preventive Care 65 Years and Older, Female Preventive care refers to lifestyle choices and visits with your health care provider that can promote health and wellness. What does preventive care include?  A yearly physical exam. This is also called an annual well check.  Dental exams once or twice a year.  Routine eye exams. Ask your health care provider how often you should have your eyes checked.  Personal lifestyle choices, including:  Daily care of your teeth and gums.  Regular physical activity.  Eating a healthy diet.  Avoiding tobacco and drug use.  Limiting alcohol use.  Practicing safe sex.  Taking low-dose aspirin every day.  Taking vitamin and mineral supplements as recommended by your health care provider. What happens during an annual well check? The services and  screenings done by your health care provider during your annual well check will depend on your age, overall health, lifestyle risk factors, and family history of disease. Counseling  Your health care provider may ask you questions about your:  Alcohol use.  Tobacco use.  Drug use.  Emotional well-being.  Home and relationship well-being.  Sexual activity.  Eating habits.  History of falls.  Memory and ability to understand (cognition).  Work and work Astronomer.  Reproductive health. Screening  You may have the following tests or measurements:  Height, weight, and BMI.  Blood pressure.  Lipid and cholesterol levels. These may be checked every 5 years, or more frequently if you are over 45 years old.  Skin check.  Lung cancer screening. You may have this screening every year starting at age 79 if you have a 30-pack-year history of smoking and currently smoke or have quit within the past 15 years.  Fecal occult blood test (FOBT) of the stool. You may have this test every year starting at age 56.  Flexible sigmoidoscopy or colonoscopy. You may have a sigmoidoscopy every 5 years or a colonoscopy every 10 years starting at age 63.  Hepatitis C blood test.  Hepatitis B blood test.  Sexually transmitted disease (STD) testing.  Diabetes screening. This is done by checking your blood sugar (glucose) after you have not eaten for a while (fasting). You may have this done every 1-3 years.  Bone density scan. This is done to screen for osteoporosis. You may have this done starting at age 59.  Mammogram. This may be done every 1-2 years. Talk to your health care provider about how often you should have regular mammograms. Talk with your health care  provider about your test results, treatment options, and if necessary, the need for more tests. Vaccines  Your health care provider may recommend certain vaccines, such as:  Influenza vaccine. This is recommended every  year.  Tetanus, diphtheria, and acellular pertussis (Tdap, Td) vaccine. You may need a Td booster every 10 years.  Zoster vaccine. You may need this after age 77.  Pneumococcal 13-valent conjugate (PCV13) vaccine. One dose is recommended after age 71.  Pneumococcal polysaccharide (PPSV23) vaccine. One dose is recommended after age 68. Talk to your health care provider about which screenings and vaccines you need and how often you need them. This information is not intended to replace advice given to you by your health care provider. Make sure you discuss any questions you have with your health care provider. Document Released: 06/03/2015 Document Revised: 01/25/2016 Document Reviewed: 03/08/2015 Elsevier Interactive Patient Education  2017 [employer] [place] Prevention in the Home Falls can cause injuries. They can happen to people of all ages. There are many things you can do to make your home safe and to help prevent falls. What can I do on the outside of my home?  Regularly fix the edges of walkways and driveways and fix any cracks.  Remove anything that might make you trip as you walk through a door, such as a raised step or threshold.  Trim any bushes or trees on the path to your home.  Use bright outdoor lighting.  Clear any walking paths of anything that might make someone trip, such as rocks or tools.  Regularly check to see if handrails are loose or broken. Make sure that both sides of any steps have handrails.  Any raised decks and porches should have guardrails on the edges.  Have any leaves, snow, or ice cleared regularly.  Use sand or salt on walking paths during winter.  Clean up any spills in your garage right away. This includes oil or grease spills. What can I do in the bathroom?  Use night lights.  Install grab bars by the toilet and in the tub and shower. Do not use towel bars as grab bars.  Use non-skid mats or decals in the tub or shower.  If you  need to sit down in the shower, use a plastic, non-slip stool.  Keep the floor dry. Clean up any water that spills on the floor as soon as it happens.  Remove soap buildup in the tub or shower regularly.  Attach bath mats securely with double-sided non-slip rug tape.  Do not have throw rugs and other things on the floor that can make you trip. What can I do in the bedroom?  Use night lights.  Make sure that you have a light by your bed that is easy to reach.  Do not use any sheets or blankets that are too big for your bed. They should not hang down onto the floor.  Have a firm chair that has side arms. You can use this for support while you get dressed.  Do not have throw rugs and other things on the floor that can make you trip. What can I do in the kitchen?  Clean up any spills right away.  Avoid walking on wet floors.  Keep items that you use a lot in easy-to-reach places.  If you need to reach something above you, use a strong step stool that has a grab bar.  Keep electrical cords out of the way.  Do not use floor polish  or wax that makes floors slippery. If you must use wax, use non-skid floor wax.  Do not have throw rugs and other things on the floor that can make you trip. What can I do with my stairs?  Do not leave any items on the stairs.  Make sure that there are handrails on both sides of the stairs and use them. Fix handrails that are broken or loose. Make sure that handrails are as long as the stairways.  Check any carpeting to make sure that it is firmly attached to the stairs. Fix any carpet that is loose or worn.  Avoid having throw rugs at the top or bottom of the stairs. If you do have throw rugs, attach them to the floor with carpet tape.  Make sure that you have a light switch at the top of the stairs and the bottom of the stairs. If you do not have them, ask someone to add them for you. What else can I do to help prevent falls?  Wear shoes  that:  Do not have high heels.  Have rubber bottoms.  Are comfortable and fit you well.  Are closed at the toe. Do not wear sandals.  If you use a stepladder:  Make sure that it is fully opened. Do not climb a closed stepladder.  Make sure that both sides of the stepladder are locked into place.  Ask someone to hold it for you, if possible.  Clearly mark and make sure that you can see:  Any grab bars or handrails.  First and last steps.  Where the edge of each step is.  Use tools that help you move around (mobility aids) if they are needed. These include:  Canes.  Walkers.  Scooters.  Crutches.  Turn on the lights when you go into a dark area. Replace any light bulbs as soon as they burn out.  Set up your furniture so you have a clear path. Avoid moving your furniture around.  If any of your floors are uneven, fix them.  If there are any pets around you, be aware of where they are.  Review your medicines with your doctor. Some medicines can make you feel dizzy. This can increase your chance of falling. Ask your doctor what other things that you can do to help prevent falls. This information is not intended to replace advice given to you by your health care provider. Make sure you discuss any questions you have with your health care provider. Document Released: 03/03/2009 Document Revised: 10/13/2015 Document Reviewed: 06/11/2014 Elsevier Interactive Patient Education  2017 [employer].

## 2017-01-07 NOTE — Telephone Encounter (Signed)
Called patient to inform of sleep study results- No significant sleep apnea, CPAP not indicated- awaiting call back to inform.

## 2017-01-10 ENCOUNTER — Encounter: Payer: Self-pay | Admitting: Internal Medicine

## 2017-01-10 NOTE — Progress Notes (Signed)
01/11/2017 11:42 AM   [name] 1946/02/24 [id number]  Referring provider: Reubin Milan, MD [address] [place], [place] [address]  Chief Complaint  Patient presents with  . New Patient (Initial Visit)    Mixed incontinece referred by  Dr. Phineas Real    HPI: Patient is a 71 -year-old [place] American female who is referred to [place] by, Dr. Judithann Mondor, for urinary incontinence.  Patient states that she has had urinary incontinence since she had her stroke in 1999.  She has been on Myrbetriq 50 mg daily for two years.  She is not finding it helpful.  She has also tried a pessary in the past without reduction in incontinence.    Patient has incontinence with SUI and UI.  She states the UI happens a lot.  She is experiencing 3 incontinent episodes during the day. She is experiencing 0 incontinent episodes during the night.  Her incontinence volume is large.   She is wearing one pads/depends daily.    She is having associated urinary frequency and nocturia.  She does not have a history of urinary tract infections, STI's or injury to the bladder.  She denies dysuria, gross hematuria, suprapubic pain, back pain, abdominal pain or flank pain.  She has not had any recent fevers, chills, nausea or vomiting.     She does not have a history of nephrolithiasis, GU surgery or GU trauma.    She is post menopausal.  She denies constipation and/or diarrhea.   She has not had any recent imaging studies.    She is drinking quart of water daily.   She is drinking no caffeinated beverages daily.  She is not drinking alcoholic beverages daily.    Reviewed referral notes - see above  PMH: Past Medical History:  Diagnosis Date  . Diabetes mellitus without complication (HCC)   . Osteoporosis   . Overactive bladder   . Stroke [facility])     Surgical History: Past Surgical History:  Procedure Laterality Date  . CESAREAN SECTION  1975  . COLONOSCOPY  2010   normal    Home  Medications:  Allergies as of 01/11/2017      Reactions   Nitrofurantoin Nausea Only      Medication List       Accurate as of 01/11/17 11:42 AM. Always use your most recent med list.          ACCU-CHEK FASTCLIX LANCETS Misc 1 each by Does not apply route 2 (two) times daily.   alendronate 70 MG tablet Commonly known as:  FOSAMAX TAKE ONE TABLET BY MOUTH ONCE A WEEK   aspirin 81 MG chewable tablet Chew 1 tablet by mouth daily.   baclofen 10 MG tablet Commonly known as:  LIORESAL TAKE ONE TABLET BY MOUTH TWICE DAILY   CALCIUM 500 + D 500-125 MG-UNIT Tabs Generic drug:  Calcium Carbonate-Vitamin D Take by mouth.   glucose blood test strip Commonly known as:  ACCU-CHEK GUIDE Test BS twice a day   metFORMIN 500 MG 24 hr tablet Commonly known as:  GLUCOPHAGE-XR Take 1 tablet (500 mg total) by mouth daily with breakfast.   mirabegron ER 50 MG Tb24 tablet Commonly known as:  MYRBETRIQ Take 1 tablet (50 mg total) by mouth daily.   MULTIVITAMIN ADULT PO Take by mouth.            Discharge Care Instructions        Start     Ordered  01/11/17 0000  BLADDER SCAN AMB NON-IMAGING     01/11/17 1103      Allergies:  Allergies  Allergen Reactions  . Nitrofurantoin Nausea Only    Family History: Family History  Problem Relation Age of Onset  . Diabetes Mother   . Thyroid disease Mother   . Breast cancer Maternal Aunt   . Breast cancer Cousin        mat cousin  . Prostate cancer Brother   . Kidney failure Brother   . Kidney cancer Son   . Bladder Cancer Neg Hx     Social History:  reports that she has never smoked. She has never used smokeless tobacco. She reports that she does not drink alcohol or use drugs.  ROS: UROLOGY Frequent Urination?: Yes Hard to postpone urination?: No Burning/pain with urination?: No Get up at night to urinate?: Yes Leakage of urine?: Yes Urine stream starts and stops?: No Trouble starting stream?: No Do you have to  strain to urinate?: No Blood in urine?: No Urinary tract infection?: No Sexually transmitted disease?: No Injury to kidneys or bladder?: No Painful intercourse?: No Weak stream?: No Currently pregnant?: No Vaginal bleeding?: No Last menstrual period?: 1995  Gastrointestinal Nausea?: No Vomiting?: No Indigestion/heartburn?: No Diarrhea?: No Constipation?: No  Constitutional Fever: No Night sweats?: No Weight loss?: No Fatigue?: No  Skin Skin rash/lesions?: No Itching?: No  Eyes Blurred vision?: No Double vision?: No  Ears/Nose/Throat Sore throat?: No Sinus problems?: No  Hematologic/Lymphatic Swollen glands?: No Easy bruising?: No  Cardiovascular Leg swelling?: No Chest pain?: No  Respiratory Cough?: No Shortness of breath?: No  Endocrine Excessive thirst?: Yes  Musculoskeletal Back pain?: No Joint pain?: No  Neurological Headaches?: No Dizziness?: No  Psychologic Depression?: No Anxiety?: No  Physical Exam: BP 130/74   Pulse 76   Ht 4\' 9"  (1.448 m)   Wt 161 lb (73 kg)   BMI 34.84 kg/m   Constitutional: Well nourished. Alert and oriented, No acute distress. HEENT: [place] AT, moist mucus membranes. Trachea midline, no masses. Cardiovascular: No clubbing, cyanosis, or edema. Respiratory: Normal respiratory effort, no increased work of breathing. GI: Abdomen is soft, non tender, non distended, no abdominal masses. Liver and spleen not palpable.  No hernias appreciated.  Stool sample for occult testing is not indicated.   GU: No CVA tenderness.  No bladder fullness or masses.  Atrophic external genitalia, normal pubic hair distribution, no lesions.  Normal urethral meatus, no lesions, no prolapse, no discharge.   No urethral masses, tenderness and/or tenderness. No bladder fullness, tenderness or masses. pale vagina mucosa, poor estrogen effect, no discharge, no lesions, good pelvic support, Grade II cystocele is noted.  No rectocele noted.  No  cervical motion tenderness.  Uterus is freely mobile and non-fixed.  No adnexal/parametria masses or tenderness noted.  Anus and perineum are without rashes or lesions.    Skin: No rashes, bruises or suspicious lesions. Lymph: No cervical or inguinal adenopathy. Neurologic: Grossly intact, no focal deficits, moving all 4 extremities. Psychiatric: Normal mood and affect.  Laboratory Data: Lab Results  Component Value Date   HGBA1C 8.2 (H) 11/05/2016    I have reviewed the labs.   Pertinent Imaging: Results for KELCEE, BESS (MRN [id number]) as of 01/11/2017 11:51  Ref. Range 01/11/2017 11:15  Scan Result Unknown 0   I have independently reviewed the films.    Assessment & Plan:    1. Mixed Incontinence  - offered behavioral therapies, bladder training and bladder  control strategies - patient to engage in timed voids  - pelvic floor muscle training - patient has financial concerns and cannot afford co pay for PT  - offered PTNS - patient has financial concerns and cannot afford co pay for PTNS  - fluid management   - offered medical therapy with anticholinergic therapy but she was not wanting to risk the side effects   - refer to Dr. Sherron Monday - ? Candidate for Botox or PNE   2. Vaginal atrophy  - not a candidate for vaginal estrogen cream due to her history of strokes   Return for to see Dr. Sherron Monday.  These notes generated with voice recognition software. I apologize for typographical errors.  Michiel Cowboy, PA-C  [facility] [address], [address] [place], [place] [address] [phone]

## 2017-01-11 ENCOUNTER — Encounter: Payer: Self-pay | Admitting: Urology

## 2017-01-11 ENCOUNTER — Ambulatory Visit (INDEPENDENT_AMBULATORY_CARE_PROVIDER_SITE_OTHER): Payer: Medicare Other | Admitting: Urology

## 2017-01-11 VITALS — BP 130/74 | HR 76 | Ht <= 58 in | Wt 161.0 lb

## 2017-01-11 DIAGNOSIS — N952 Postmenopausal atrophic vaginitis: Secondary | ICD-10-CM | POA: Diagnosis not present

## 2017-01-11 DIAGNOSIS — N3946 Mixed incontinence: Secondary | ICD-10-CM

## 2017-01-11 LAB — BLADDER SCAN AMB NON-IMAGING: Scan Result: 0

## 2017-01-16 ENCOUNTER — Other Ambulatory Visit: Payer: Self-pay | Admitting: Internal Medicine

## 2017-01-16 DIAGNOSIS — E1165 Type 2 diabetes mellitus with hyperglycemia: Principal | ICD-10-CM

## 2017-01-16 DIAGNOSIS — IMO0001 Reserved for inherently not codable concepts without codable children: Secondary | ICD-10-CM

## 2017-02-06 ENCOUNTER — Ambulatory Visit (INDEPENDENT_AMBULATORY_CARE_PROVIDER_SITE_OTHER): Payer: Medicare Other | Admitting: Internal Medicine

## 2017-02-06 ENCOUNTER — Encounter: Payer: Self-pay | Admitting: Internal Medicine

## 2017-02-06 VITALS — BP 118/78 | HR 88 | Ht <= 58 in | Wt 157.8 lb

## 2017-02-06 DIAGNOSIS — I6930 Unspecified sequelae of cerebral infarction: Secondary | ICD-10-CM

## 2017-02-06 DIAGNOSIS — E1165 Type 2 diabetes mellitus with hyperglycemia: Secondary | ICD-10-CM

## 2017-02-06 DIAGNOSIS — IMO0001 Reserved for inherently not codable concepts without codable children: Secondary | ICD-10-CM

## 2017-02-06 DIAGNOSIS — Z23 Encounter for immunization: Secondary | ICD-10-CM | POA: Diagnosis not present

## 2017-02-06 MED ORDER — ALENDRONATE SODIUM 70 MG PO TABS: 70.0000 mg | ORAL_TABLET | ORAL | 12 refills | Status: DC

## 2017-02-06 MED ORDER — BACLOFEN 10 MG PO TABS: 10.0000 mg | ORAL_TABLET | Freq: Two times a day (BID) | ORAL | 12 refills | Status: DC

## 2017-02-06 NOTE — Progress Notes (Signed)
Date:  02/06/2017   Name:  Sharon York   DOB:  [birth year]   MRN:  [id number]   Chief Complaint: Diabetes (New Diabetic-Taking Metformin. Needs to recheck A1C. )  Diabetes  She presents for her follow-up diabetic visit. Her disease course has been improving. There are no hypoglycemic associated symptoms. Pertinent negatives for hypoglycemia include no dizziness or headaches. Pertinent negatives for diabetes include no blurred vision, no chest pain, no fatigue, no polyphagia, no polyuria and no visual change. Symptoms are stable. Current diabetic treatment includes oral agent (monotherapy). She is compliant with treatment all of the time. Her weight is decreasing steadily. She is following a generally healthy diet. Her home blood glucose trend is decreasing steadily. Her breakfast blood glucose is taken between 6-7 am. Her breakfast blood glucose range is generally 110-130 mg/dl. Her dinner blood glucose is taken between 5-6 pm. Her dinner blood glucose range is generally 130-140 mg/dl. An ACE inhibitor/angiotensin II receptor blocker is not being taken. Eye exam is current.      Review of Systems  Constitutional: Positive for unexpected weight change (has lost 4 lbs). Negative for chills, fatigue and fever.  Eyes: Negative for blurred vision and visual disturbance.  Respiratory: Negative for chest tightness and shortness of breath.   Cardiovascular: Negative for chest pain and palpitations.  Gastrointestinal: Negative for abdominal pain and diarrhea.  Endocrine: Negative for polyphagia and polyuria.  Genitourinary: Negative for dysuria.  Neurological: Negative for dizziness and headaches.    Patient Active Problem List   Diagnosis Date Noted  . DM (diabetes mellitus), type 2, uncontrolled (HCC) 11/06/2016  . Tinea corporis 03/17/2015  . Nocturnal cough 03/17/2015  . Abnormal gait 12/15/2014  . Altered bowel function 12/15/2014  . Blood pressure elevated without history of HTN  12/15/2014  . History of cerebrovascular accident with residual deficit 12/15/2014  . Absence of bladder continence 12/15/2014  . Obstructive sleep apnea of adult 12/15/2014  . Arthritis of shoulder region, degenerative 12/15/2014  . OP (osteoporosis) 12/15/2014  . Allergic rhinitis, seasonal 12/15/2014  . Mixed incontinence 03/11/2013  . 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 Authorizing Provider  ACCU-CHEK FASTCLIX LANCETS MISC 1 each by Does not apply route 2 (two) times daily. 11/26/16  Yes Reubin Milan, MD  alendronate (FOSAMAX) 70 MG tablet TAKE ONE TABLET BY MOUTH ONCE A WEEK 03/20/16  Yes Reubin Milan, MD  aspirin 81 MG chewable tablet Chew 1 tablet by mouth daily.   Yes [provider]  baclofen (LIORESAL) 10 MG tablet TAKE ONE TABLET BY MOUTH TWICE DAILY 03/20/16  Yes Reubin Milan, MD  Calcium Carbonate-Vitamin D (CALCIUM 500 + D) 500-125 MG-UNIT TABS Take by mouth.   Yes [provider]  glucose blood (ACCU-CHEK GUIDE) test strip Test BS twice a day 11/26/16  Yes Reubin Milan, MD  metFORMIN (GLUCOPHAGE-XR) 500 MG 24 hr tablet TAKE 1 TABLET BY MOUTH ONCE DAILY WITH  BREAKFAST 01/16/17  Yes Reubin Milan, MD  mirabegron ER (MYRBETRIQ) 50 MG TB24 tablet Take 1 tablet (50 mg total) by mouth daily. 05/11/16  Yes Reubin Milan, MD  Multiple Vitamins-Minerals (MULTIVITAMIN ADULT PO) Take by mouth.   Yes [provider]    Allergies  Allergen Reactions  . Nitrofurantoin Nausea Only    Past Surgical History:  Procedure Laterality Date  . CESAREAN SECTION  1975  . COLONOSCOPY  2010   normal    Social History  Substance Use Topics  . Smoking status: Never Smoker  . Smokeless tobacco: Never Used  . Alcohol use No     Medication list has been reviewed and updated.  PHQ 2/9 Scores 01/07/2017 11/22/2016 11/28/2015 01/05/2015  PHQ - 2 Score 0 0  0 0    Physical Exam  Constitutional: She is oriented to person, place, and time. She appears well-developed. No distress.  HENT:  Head: Normocephalic and atraumatic.  Neck: Normal range of motion. No thyromegaly present.  Cardiovascular: Normal rate, regular rhythm and normal heart sounds.   Pulmonary/Chest: Effort normal and breath sounds normal. No respiratory distress. She has no wheezes.  Musculoskeletal: She exhibits edema (trace ankle).  Neurological: She is alert and oriented to person, place, and time. Gait abnormal.  Left UE dense hemi-paresis, mild LLE weakness  Skin: Skin is warm and dry. No rash noted.  Psychiatric: She has a normal mood and affect. Her speech is normal and behavior is normal. Thought content normal.  Nursing note and vitals reviewed.   BP 118/78   Pulse 88   Ht  (1.448 m)   Wt 157 lb 12.8 oz (71.6 kg)   SpO2 95%   BMI 34.15 kg/m   Assessment and Plan: 1. Uncontrolled type 2 diabetes mellitus without complication, without long-term current use of insulin (HCC) Improving - continue current therapy - Basic metabolic panel - Hemoglobin A1c  2. Need for influenza vaccination - Flu vaccine HIGH DOSE PF  3. History of cerebrovascular accident with residual deficit Stable deficit   Meds ordered this encounter  Medications  . baclofen (LIORESAL) 10 MG tablet    Sig: Take 1 tablet (10 mg total) by mouth 2 (two) times daily.    Dispense:  60 tablet    Refill:  12  . alendronate (FOSAMAX) 70 MG tablet    Sig: Take 1 tablet (70 mg total) by mouth once a week. Take with a full glass of water on an empty stomach.    Dispense:  4 tablet    Refill:  12    Partially dictated using Animal nutritionist. Any errors are unintentional.  Bari Edward, MD [facility]  02/06/2017

## 2017-02-07 LAB — BASIC METABOLIC PANEL
BUN/Creatinine Ratio: 20 (ref 12–28)
BUN: 13 mg/dL (ref 8–27)
CO2: 27 mmol/L (ref 20–29)
Calcium: 9.7 mg/dL (ref 8.7–10.3)
Chloride: 101 mmol/L (ref 96–106)
Creatinine, Ser: 0.65 mg/dL (ref 0.57–1.00)
GFR calc Af Amer: 102 mL/min/{1.73_m2} (ref >59)
GFR calc non Af Amer: 88 mL/min/{1.73_m2} (ref >59)
Glucose: 120 mg/dL — ABNORMAL HIGH (ref 65–99)
Potassium: 4.3 mmol/L (ref 3.5–5.2)
Sodium: 143 mmol/L (ref 134–144)

## 2017-02-07 LAB — HEMOGLOBIN A1C
Est. average glucose Bld gHb Est-mCnc: 143 mg/dL
Hgb A1c MFr Bld: 6.6 % — ABNORMAL HIGH (ref 4.8–5.6)

## 2017-02-18 ENCOUNTER — Encounter: Payer: Self-pay | Admitting: Urology

## 2017-02-18 ENCOUNTER — Ambulatory Visit (INDEPENDENT_AMBULATORY_CARE_PROVIDER_SITE_OTHER): Payer: Medicare Other | Admitting: Urology

## 2017-02-18 VITALS — BP 162/83 | HR 74 | Ht <= 58 in | Wt 157.6 lb

## 2017-02-18 DIAGNOSIS — N952 Postmenopausal atrophic vaginitis: Secondary | ICD-10-CM

## 2017-02-18 DIAGNOSIS — N3946 Mixed incontinence: Secondary | ICD-10-CM | POA: Diagnosis not present

## 2017-02-18 LAB — URINALYSIS, COMPLETE
Bilirubin, UA: NEGATIVE
Glucose, UA: NEGATIVE
Ketones, UA: NEGATIVE
Leukocytes, UA: NEGATIVE
Nitrite, UA: NEGATIVE
Protein, UA: NEGATIVE
RBC, UA: NEGATIVE
Specific Gravity, UA: 1.015 (ref 1.005–1.030)
Urobilinogen, Ur: 0.2 mg/dL (ref 0.2–1.0)
pH, UA: 8.5 — ABNORMAL HIGH (ref 5.0–7.5)

## 2017-02-18 MED ORDER — FESOTERODINE FUMARATE ER 4 MG PO TB24: 4.0000 mg | ORAL_TABLET | Freq: Every day | ORAL | 11 refills | Status: DC

## 2017-02-18 NOTE — Progress Notes (Signed)
02/18/2017 11:54 AM   Sharon York 06-11-1945 [id number]  Referring provider: Reubin Milan, MD [address] [place], [place] [address]  Chief Complaint  Patient presents with  . Urinary Incontinence    HPI: Sharon York:    Patient states that she has had urinary incontinence since she had her stroke in 1999.  She has been on Myrbetriq 50 mg daily for two years.  She is not finding it helpful.  She has also tried a pessary in the past without reduction in incontinence.    Patient has incontinence with SUI and UI.  She states the UI happens a lot.  She is experiencing 3 incontinent episodes during the day. She is experiencing 0 incontinent episodes during the night.  Her incontinence volume is large.   She is wearing one pads/depends daily.   She was noted to have a grade 2 cystocele  Today The patient did not wish to have percutaneous tibial nerve stimulation and physical therapy because of cost. She did not want to try anticholinergic therapy due to potential side effects. She still wears one pad per day. Frequency is stable  PMH: Past Medical History:  Diagnosis Date  . Diabetes mellitus without complication (HCC)   . Osteoporosis   . Overactive bladder   . Stroke [facility])     Surgical History: Past Surgical History:  Procedure Laterality Date  . CESAREAN SECTION  1975  . COLONOSCOPY  2010   normal    Home Medications:  Allergies as of 02/18/2017      Reactions   Nitrofurantoin Nausea Only      Medication List       Accurate as of 02/18/17 11:54 AM. Always use your most recent med list.          ACCU-CHEK FASTCLIX LANCETS Misc 1 each by Does not apply route 2 (two) times daily.   alendronate 70 MG tablet Commonly known as:  FOSAMAX Take 1 tablet (70 mg total) by mouth once a week. Take with a full glass of water on an empty stomach.   aspirin 81 MG chewable tablet Chew 1 tablet by mouth daily.   baclofen 10 MG tablet Commonly known as:   LIORESAL Take 1 tablet (10 mg total) by mouth 2 (two) times daily.   CALCIUM 500 + D 500-125 MG-UNIT Tabs Generic drug:  Calcium Carbonate-Vitamin D Take by mouth.   fesoterodine 4 MG Tb24 tablet Commonly known as:  TOVIAZ Take 1 tablet (4 mg total) by mouth daily.   glucose blood test strip Commonly known as:  ACCU-CHEK GUIDE Test BS twice a day   metFORMIN 500 MG 24 hr tablet Commonly known as:  GLUCOPHAGE-XR TAKE 1 TABLET BY MOUTH ONCE DAILY WITH  BREAKFAST   mirabegron ER 50 MG Tb24 tablet Commonly known as:  MYRBETRIQ Take 1 tablet (50 mg total) by mouth daily.   MULTIVITAMIN ADULT PO Take by mouth.       Allergies:  Allergies  Allergen Reactions  . Nitrofurantoin Nausea Only    Family History: Family History  Problem Relation Age of Onset  . Diabetes Mother   . Thyroid disease Mother   . Breast cancer Maternal Aunt   . Breast cancer Cousin        mat cousin  . Prostate cancer Brother   . Kidney failure Brother   . Kidney cancer Son   . Bladder Cancer Neg Hx     Social History:  reports that she has never  smoked. She has never used smokeless tobacco. She reports that she does not drink alcohol or use drugs.  ROS: UROLOGY Frequent Urination?: Yes Hard to postpone urination?: No Burning/pain with urination?: No Get up at night to urinate?: Yes Leakage of urine?: No Urine stream starts and stops?: No Trouble starting stream?: No Do you have to strain to urinate?: No Blood in urine?: No Urinary tract infection?: No Sexually transmitted disease?: No Injury to kidneys or bladder?: No Painful intercourse?: No Weak stream?: No Currently pregnant?: No Vaginal bleeding?: No Last menstrual period?: n  Gastrointestinal Nausea?: No Vomiting?: No Indigestion/heartburn?: No Diarrhea?: No Constipation?: Yes  Constitutional Fever: No Night sweats?: No Weight loss?: Yes Fatigue?: No  Skin Skin rash/lesions?: No Itching?: No  Eyes Blurred  vision?: No Double vision?: No  Ears/Nose/Throat Sore throat?: No Sinus problems?: No  Hematologic/Lymphatic Swollen glands?: No Easy bruising?: No  Cardiovascular Leg swelling?: No Chest pain?: No  Respiratory Cough?: No Shortness of breath?: No  Endocrine Excessive thirst?: No  Musculoskeletal Back pain?: No Joint pain?: Yes  Neurological Headaches?: No Dizziness?: No  Psychologic Depression?: No Anxiety?: No  Physical Exam: BP (!) 162/83 (BP Location: Right Arm, Patient Position: Sitting, Cuff Size: Normal)   Pulse 74   Ht  (1.448 m)   Wt 157 lb 9.6 oz (71.5 kg)   BMI 34.10 kg/m     Laboratory Data: Lab Results  Component Value Date   WBC 7.5 02/10/2015   HGB 12.7 02/10/2015   HCT 38.7 02/10/2015   MCV 71.2 (L) 02/10/2015   PLT 240 02/10/2015    Lab Results  Component Value Date   CREATININE 0.65 02/06/2017    No results found for: PSA  No results found for: TESTOSTERONE  Lab Results  Component Value Date   HGBA1C 6.6 (H) 02/06/2017    Urinalysis    Component Value Date/Time   COLORURINE YELLOW (A) 02/10/2015 2358   APPEARANCEUR CLEAR (A) 02/10/2015 2358   LABSPEC 1.017 02/10/2015 2358   PHURINE 6.0 02/10/2015 2358   GLUCOSEU NEGATIVE 02/10/2015 2358   HGBUR NEGATIVE 02/10/2015 2358   BILIRUBINUR negative 12/23/2015 1633   KETONESUR NEGATIVE 02/10/2015 2358   PROTEINUR negative 12/23/2015 1633   PROTEINUR NEGATIVE 02/10/2015 2358   UROBILINOGEN 0.2 12/23/2015 1633   NITRITE negative 12/23/2015 1633   NITRITE NEGATIVE 02/10/2015 2358   LEUKOCYTESUR Trace (A) 12/23/2015 1633    Pertinent Imaging: none  Assessment & Plan:  The patient was given Toviaz 4 mg samples and prescription and reassess in 4-6 weeks.  There are no diagnoses linked to this encounter.  No Follow-up on file.  Martina Sinner, MD  [facility] [address] [place], [place] [address] [phone]

## 2017-02-25 ENCOUNTER — Telehealth: Payer: Self-pay

## 2017-02-25 NOTE — Telephone Encounter (Signed)
PA for Gala Murdoch has been APPROVED!

## 2017-03-25 ENCOUNTER — Encounter: Payer: Self-pay | Admitting: Urology

## 2017-03-25 ENCOUNTER — Ambulatory Visit: Payer: Medicare Other | Admitting: Urology

## 2017-03-25 VITALS — BP 158/81 | HR 83 | Ht <= 58 in | Wt 153.2 lb

## 2017-03-25 DIAGNOSIS — N3946 Mixed incontinence: Secondary | ICD-10-CM | POA: Diagnosis not present

## 2017-03-25 NOTE — Progress Notes (Signed)
03/25/2017 11:36 AM   Sharon York March 27, 1946 [id number]  Referring provider: Reubin MilanBerglund, Sharon H, MD [address] [place], [place] [address]  Chief Complaint  Patient presents with  . Urinary Incontinence    HPI: Sharon York:    Patient states that she has had urinary incontinence since she had her stroke in 1999. She has been on Myrbetriq 50 mg daily for two years. She is not finding it helpful. She has also tried a pessary in the past without reduction in incontinence.   She was noted to have a grade 2 cystocele  Last visit The patient did not wish to have percutaneous tibial nerve stimulation and physical therapy because of cost. She did not want to try anticholinergic therapy due to potential side effects. She still wears one pad per day. Frequency is stable. Toviaz given  Today Frequency and urgency much better.  She is almost completely dry and very happy.  Clinically not infected     PMH: Past Medical History:  Diagnosis Date  . Diabetes mellitus without complication (HCC)   . Osteoporosis   . Overactive bladder   . Stroke [facility](HCC)     Surgical History: Past Surgical History:  Procedure Laterality Date  . CESAREAN SECTION  1975  . COLONOSCOPY  2010   normal    Home Medications:  Allergies as of 03/25/2017      Reactions   Nitrofurantoin Nausea Only      Medication List        Accurate as of 03/25/17 11:36 AM. Always use your most recent med list.          ACCU-CHEK FASTCLIX LANCETS Misc 1 each by Does not apply route 2 (two) times daily.   alendronate 70 MG tablet Commonly known as:  FOSAMAX Take 1 tablet (70 mg total) by mouth once a week. Take with a full glass of water on an empty stomach.   aspirin 81 MG chewable tablet Chew 1 tablet by mouth daily.   baclofen 10 MG tablet Commonly known as:  LIORESAL Take 1 tablet (10 mg total) by mouth 2 (two) times daily.   CALCIUM 500 + D 500-125 MG-UNIT Tabs Generic drug:  Calcium  Carbonate-Vitamin D Take by mouth.   fesoterodine 4 MG Tb24 tablet Commonly known as:  TOVIAZ Take 1 tablet (4 mg total) by mouth daily.   glucose blood test strip Commonly known as:  ACCU-CHEK GUIDE Test BS twice a day   metFORMIN 500 MG 24 hr tablet Commonly known as:  GLUCOPHAGE-XR TAKE 1 TABLET BY MOUTH ONCE DAILY WITH  BREAKFAST   MULTIVITAMIN ADULT PO Take by mouth.       Allergies:  Allergies  Allergen Reactions  . Nitrofurantoin Nausea Only    Family History: Family History  Problem Relation Age of Onset  . Diabetes Mother   . Thyroid disease Mother   . Breast cancer Maternal Aunt   . Breast cancer Cousin        mat cousin  . Prostate cancer Brother   . Kidney failure Brother   . Kidney cancer Son   . Bladder Cancer Neg Hx     Social History:  reports that  has never smoked. she has never used smokeless tobacco. She reports that she does not drink alcohol or use drugs.  ROS: UROLOGY Frequent Urination?: Yes Hard to postpone urination?: Yes Burning/pain with urination?: No Get up at night to urinate?: No Leakage of urine?: No Urine stream starts and stops?:  No Trouble starting stream?: No Do you have to strain to urinate?: No Blood in urine?: No Urinary tract infection?: No Sexually transmitted disease?: No Injury to kidneys or bladder?: No Painful intercourse?: No Weak stream?: No Currently pregnant?: No Vaginal bleeding?: No Last menstrual period?: n  Gastrointestinal Nausea?: No Vomiting?: No Indigestion/heartburn?: No Diarrhea?: No Constipation?: No  Constitutional Fever: No Night sweats?: No Weight loss?: No Fatigue?: No  Skin Skin rash/lesions?: No Itching?: No  Eyes Blurred vision?: No Double vision?: No  Ears/Nose/Throat Sore throat?: No Sinus problems?: No  Hematologic/Lymphatic Swollen glands?: No Easy bruising?: No  Cardiovascular Leg swelling?: No Chest pain?: No  Respiratory Cough?: No Shortness of  breath?: No  Endocrine Excessive thirst?: No  Musculoskeletal Back pain?: No Joint pain?: No  Neurological Headaches?: No Dizziness?: No  Psychologic Depression?: No Anxiety?: No  Physical Exam: BP (!) 158/81 (BP Location: Right Arm, Patient Position: Sitting, Cuff Size: Normal)   Pulse 83   Ht 4\' 9"  (1.448 m)   Wt 153 lb 3.2 oz (69.5 kg)   BMI 33.15 kg/m   Constitutional:  Alert and oriented, No acute distress.  Laboratory Data: Lab Results  Component Value Date   WBC 7.5 02/10/2015   HGB 12.7 02/10/2015   HCT 38.7 02/10/2015   MCV 71.2 (L) 02/10/2015   PLT 240 02/10/2015    Lab Results  Component Value Date   CREATININE 0.65 02/06/2017    No results found for: PSA  No results found for: TESTOSTERONE  Lab Results  Component Value Date   HGBA1C 6.6 (H) 02/06/2017    Urinalysis    Component Value Date/Time   COLORURINE YELLOW (A) 02/10/2015 2358   APPEARANCEUR Clear 02/18/2017 1204   LABSPEC 1.017 02/10/2015 2358   PHURINE 6.0 02/10/2015 2358   GLUCOSEU Negative 02/18/2017 1204   HGBUR NEGATIVE 02/10/2015 2358   BILIRUBINUR Negative 02/18/2017 1204   KETONESUR NEGATIVE 02/10/2015 2358   PROTEINUR Negative 02/18/2017 1204   PROTEINUR NEGATIVE 02/10/2015 2358   UROBILINOGEN 0.2 12/23/2015 1633   NITRITE Negative 02/18/2017 1204   NITRITE NEGATIVE 02/10/2015 2358   LEUKOCYTESUR Negative 02/18/2017 1204    Pertinent Imaging: none Assessment & Plan: Reassess durability in 4 months and then reassess once a year  There are no diagnoses linked to this encounter.  No Follow-up on file.  Sharon Sinner, MD  [facility] [address], [address] [place], [place] [address] [phone]

## 2017-03-26 ENCOUNTER — Other Ambulatory Visit: Payer: Self-pay

## 2017-03-26 ENCOUNTER — Telehealth: Payer: Self-pay

## 2017-03-26 MED ORDER — FREESTYLE LIBRE READER DEVI
1.0000 | Freq: Two times a day (BID) | 0 refills | Status: DC
Start: 2017-03-26 — End: 2017-04-05

## 2017-03-26 MED ORDER — FREESTYLE LIBRE 14 DAY SENSOR MISC: 1.0000 | Freq: Two times a day (BID) | 5 refills | Status: DC

## 2017-03-26 NOTE — Telephone Encounter (Signed)
Patient called stating her BS device is not working right anymore. States she is wasting needles trying to use it. And now is having difficulty getting BS with one hand. Asked for freestyle libre to be sent into pharmacy instead. Sent device and readers to [facility] in [place]. Patient informed.

## 2017-03-29 ENCOUNTER — Other Ambulatory Visit: Payer: Self-pay | Admitting: Internal Medicine

## 2017-03-29 MED ORDER — FREESTYLE LIBRE SENSOR SYSTEM MISC: 1.0000 | 5 refills | Status: DC | PRN

## 2017-04-01 ENCOUNTER — Encounter: Payer: Self-pay | Admitting: Internal Medicine

## 2017-04-01 DIAGNOSIS — M79674 Pain in right toe(s): Secondary | ICD-10-CM | POA: Diagnosis not present

## 2017-04-01 DIAGNOSIS — B351 Tinea unguium: Secondary | ICD-10-CM | POA: Diagnosis not present

## 2017-04-01 DIAGNOSIS — M79675 Pain in left toe(s): Secondary | ICD-10-CM | POA: Diagnosis not present

## 2017-04-05 ENCOUNTER — Other Ambulatory Visit: Payer: Self-pay | Admitting: Internal Medicine

## 2017-04-05 ENCOUNTER — Telehealth: Payer: Self-pay

## 2017-04-05 MED ORDER — BLOOD GLUCOSE MONITOR KIT: PACK | 0 refills | Status: DC

## 2017-04-05 NOTE — Telephone Encounter (Signed)
Patient called stating she needs new meter and strips sent to pharmacy. Her meter now no longer works. Wants sent to [facility] in [place].

## 2017-04-08 ENCOUNTER — Encounter: Payer: Self-pay | Admitting: Internal Medicine

## 2017-04-08 ENCOUNTER — Ambulatory Visit (INDEPENDENT_AMBULATORY_CARE_PROVIDER_SITE_OTHER): Payer: Medicare Other | Admitting: Internal Medicine

## 2017-04-08 ENCOUNTER — Other Ambulatory Visit: Payer: Self-pay | Admitting: Internal Medicine

## 2017-04-08 VITALS — BP 130/72 | HR 78 | Ht <= 58 in | Wt 158.0 lb

## 2017-04-08 DIAGNOSIS — N3000 Acute cystitis without hematuria: Secondary | ICD-10-CM

## 2017-04-08 DIAGNOSIS — E1165 Type 2 diabetes mellitus with hyperglycemia: Secondary | ICD-10-CM

## 2017-04-08 LAB — POC URINALYSIS WITH MICROSCOPIC (NON AUTO)MANUAL RESULT
Bilirubin, UA: NEGATIVE
Epithelial cells, urine per micros: 2
Glucose, UA: NEGATIVE
Ketones, UA: NEGATIVE
Mucus, UA: 0
Nitrite, UA: NEGATIVE
Protein, UA: NEGATIVE
RBC: 0 M/uL — AB (ref 4.04–5.48)
Spec Grav, UA: 1.01 (ref 1.010–1.025)
Urobilinogen, UA: 0.2 E.U./dL
WBC Casts, UA: 4
pH, UA: 7 (ref 5.0–8.0)

## 2017-04-08 MED ORDER — CIPROFLOXACIN HCL 250 MG PO TABS: 250.0000 mg | ORAL_TABLET | Freq: Two times a day (BID) | ORAL | 0 refills | Status: AC

## 2017-04-08 NOTE — Progress Notes (Signed)
Date:  04/08/2017   Name:  Sharon York   DOB:  18-May-1946   MRN:  [id number]   Chief Complaint: Urinary Tract Infection (Started Friday. Running to the bathroom more than usual. No Burning. Just a lot of urgency to go. ) Urinary Tract Infection   This is a new problem. The current episode started yesterday. The problem has been unchanged. The patient is experiencing no pain. There has been no fever. Associated symptoms include frequency and urgency. Pertinent negatives include no chills, hematuria, nausea or vomiting. She has tried nothing for the symptoms.  Blood sugars have been good - the highest is 119.    Review of Systems  Constitutional: Negative for chills, fatigue and fever.  Respiratory: Negative for chest tightness and shortness of breath.   Cardiovascular: Negative for chest pain.  Gastrointestinal: Negative for nausea and vomiting.  Genitourinary: Positive for frequency and urgency. Negative for hematuria.    Patient Active Problem List   Diagnosis Date Noted  . DM (diabetes mellitus), type 2, uncontrolled ([place]) 11/06/2016  . Tinea corporis 03/17/2015  . Nocturnal cough 03/17/2015  . Abnormal gait 12/15/2014  . Altered bowel function 12/15/2014  . Blood pressure elevated without history of HTN 12/15/2014  . Hemiparesis affecting left side as late effect of cerebrovascular accident (CVA) ([place]) 12/15/2014  . Absence of bladder continence 12/15/2014  . Obstructive sleep apnea of adult 12/15/2014  . Arthritis of shoulder region, degenerative 12/15/2014  . OP (osteoporosis) 12/15/2014  . Allergic rhinitis, seasonal 12/15/2014  . Mixed incontinence 03/11/2013    Prior to Admission medications   Medication Sig Start Date End Date Taking? Authorizing Provider  ACCU-CHEK FASTCLIX LANCETS MISC 1 each by Does not apply route 2 (two) times daily. 11/26/16   [name], MD  alendronate (FOSAMAX) 70 MG tablet Take 1 tablet (70 mg total) by mouth once a week. Take  with a full glass of water on an empty stomach. 02/06/17   [name], MD  aspirin 81 MG chewable tablet Chew 1 tablet by mouth daily.    [provider]  baclofen (LIORESAL) 10 MG tablet Take 1 tablet (10 mg total) by mouth 2 (two) times daily. 02/06/17   [name], MD  blood glucose meter kit and supplies KIT Dispense based on patient and insurance preference. Use up to four times daily as directed. (FOR ICD-9 250.00, 250.01). 04/05/17   [name], MD  Calcium Carbonate-Vitamin D (CALCIUM 500 + D) 500-125 MG-UNIT TABS Take by mouth.    [provider]  fesoterodine (TOVIAZ) 4 MG TB24 tablet Take 1 tablet (4 mg total) by mouth daily. 02/18/17   Bjorn Loser, MD  glucose blood (ACCU-CHEK GUIDE) test strip Test BS twice a day 11/26/16   [name], MD  metFORMIN (GLUCOPHAGE-XR) 500 MG 24 hr tablet TAKE 1 TABLET BY MOUTH ONCE DAILY WITH  BREAKFAST 01/16/17   [name], MD  Multiple Vitamins-Minerals (MULTIVITAMIN ADULT PO) Take by mouth.    [provider]    Allergies  Allergen Reactions  . Nitrofurantoin Nausea Only    Past Surgical History:  Procedure Laterality Date  . CESAREAN SECTION  1975  . COLONOSCOPY  2010   normal    Social History   Tobacco Use  . Smoking status: Never Smoker  . Smokeless tobacco: Never Used  Substance Use Topics  . Alcohol use: No    Alcohol/week: 0.0 oz  . Drug use: No  Medication list has been reviewed and updated.  PHQ 2/9 Scores 01/07/2017 11/22/2016 11/28/2015 01/05/2015  PHQ - 2 Score 0 0 0 0    Physical Exam  Constitutional: She appears well-developed and well-nourished.  Cardiovascular: Normal rate, regular rhythm and normal heart sounds.  Pulmonary/Chest: Effort normal and breath sounds normal. No respiratory distress.  Abdominal: Soft. Bowel sounds are normal. There is tenderness in the suprapubic area. There is no rebound, no guarding and no CVA tenderness.  Psychiatric:  She has a normal mood and affect.  Nursing note and vitals reviewed.   BP 130/72   Pulse 78   Ht '4\' 9"'  (1.448 m)   Wt 158 lb (71.7 kg)   BMI 34.19 kg/m   Assessment and Plan: 1. Acute cystitis without hematuria Increase fluids - POC urinalysis w microscopic (non auto) - ciprofloxacin (CIPRO) 250 MG tablet; Take 1 tablet (250 mg total) 2 (two) times daily for 7 days by mouth.  Dispense: 14 tablet; Refill: 0  2. Uncontrolled type 2 diabetes mellitus with hyperglycemia (HCC) Now has meter and checking BS regularly - Microalbumin / creatinine urine ratio   Meds ordered this encounter  Medications  . ciprofloxacin (CIPRO) 250 MG tablet    Sig: Take 1 tablet (250 mg total) 2 (two) times daily for 7 days by mouth.    Dispense:  14 tablet    Refill:  0    Partially dictated using Editor, commissioning. Any errors are unintentional.  Halina Maidens, MD [facility]  04/08/2017

## 2017-04-10 LAB — MICROALBUMIN / CREATININE URINE RATIO
Creatinine, Urine: 88.1 mg/dL
Microalb/Creat Ratio: 9.3 mg/g creat (ref 0.0–30.0)
Microalbumin, Urine: 8.2 ug/mL

## 2017-04-23 ENCOUNTER — Other Ambulatory Visit: Payer: Self-pay | Admitting: Internal Medicine

## 2017-04-23 ENCOUNTER — Ambulatory Visit (INDEPENDENT_AMBULATORY_CARE_PROVIDER_SITE_OTHER): Payer: Medicare Other | Admitting: Internal Medicine

## 2017-04-23 ENCOUNTER — Encounter: Payer: Self-pay | Admitting: Internal Medicine

## 2017-04-23 VITALS — BP 120/72 | HR 87 | Ht <= 58 in | Wt 158.0 lb

## 2017-04-23 DIAGNOSIS — N3001 Acute cystitis with hematuria: Secondary | ICD-10-CM

## 2017-04-23 LAB — POCT URINALYSIS DIPSTICK
Bilirubin, UA: NEGATIVE
Glucose, UA: NEGATIVE
Ketones, UA: NEGATIVE
Nitrite, UA: POSITIVE
Protein, UA: 30
Spec Grav, UA: <=1.005 — AB (ref 1.010–1.025)
Urobilinogen, UA: 0.2 E.U./dL
pH, UA: 7 (ref 5.0–8.0)

## 2017-04-23 MED ORDER — CEPHALEXIN 500 MG PO CAPS: 500.0000 mg | ORAL_CAPSULE | Freq: Four times a day (QID) | ORAL | 0 refills | Status: AC

## 2017-04-23 NOTE — Progress Notes (Signed)
Date:  04/23/2017   Name:  Sharon York   DOB:  Jun 19, 1945   MRN:  [id number]   Chief Complaint: Urinary Tract Infection (Patient states she has blood in urine. Symptoms of urgency- no pain or burning. BS this morning- 107.) Urinary Tract Infection   This is a recurrent problem. The current episode started 1 to 4 weeks ago. The problem occurs every urination. The problem has been gradually worsening. The quality of the pain is described as burning. The pain is mild. There has been no fever. Associated symptoms include frequency, hematuria and urgency. Pertinent negatives include no chills, nausea or vomiting. She has tried antibiotics (completed cipro one week ago) for the symptoms.      Review of Systems  Constitutional: Negative for chills, fatigue and fever.  Respiratory: Negative for chest tightness and shortness of breath.   Cardiovascular: Negative for chest pain.  Gastrointestinal: Negative for diarrhea, nausea and vomiting.  Genitourinary: Positive for frequency, hematuria and urgency. Negative for difficulty urinating and dysuria.    Patient Active Problem List   Diagnosis Date Noted  . DM (diabetes mellitus), type 2, uncontrolled ([place]) 11/06/2016  . Tinea corporis 03/17/2015  . Nocturnal cough 03/17/2015  . Abnormal gait 12/15/2014  . Altered bowel function 12/15/2014  . Blood pressure elevated without history of HTN 12/15/2014  . Hemiparesis affecting left side as late effect of cerebrovascular accident (CVA) ([place]) 12/15/2014  . Absence of bladder continence 12/15/2014  . Obstructive sleep apnea of adult 12/15/2014  . Arthritis of shoulder region, degenerative 12/15/2014  . OP (osteoporosis) 12/15/2014  . Allergic rhinitis, seasonal 12/15/2014  . Mixed incontinence 03/11/2013    Prior to Admission medications   Medication Sig Start Date End Date Taking? Authorizing Provider  ACCU-CHEK FASTCLIX LANCETS MISC 1 each by Does not apply route 2 (two) times daily.  11/26/16   [name], MD  alendronate (FOSAMAX) 70 MG tablet Take 1 tablet (70 mg total) by mouth once a week. Take with a full glass of water on an empty stomach. 02/06/17   [name], MD  aspirin 81 MG chewable tablet Chew 1 tablet by mouth daily.    [provider]  baclofen (LIORESAL) 10 MG tablet Take 1 tablet (10 mg total) by mouth 2 (two) times daily. 02/06/17   [name], MD  blood glucose meter kit and supplies KIT Dispense based on patient and insurance preference. Use up to four times daily as directed. (FOR ICD-9 250.00, 250.01). 04/05/17   [name], MD  Calcium Carbonate-Vitamin D (CALCIUM 500 + D) 500-125 MG-UNIT TABS Take by mouth.    [provider]  fesoterodine (TOVIAZ) 4 MG TB24 tablet Take 1 tablet (4 mg total) by mouth daily. 02/18/17   Bjorn Loser, MD  glucose blood (ACCU-CHEK GUIDE) test strip Test BS twice a day 11/26/16   [name], MD  metFORMIN (GLUCOPHAGE-XR) 500 MG 24 hr tablet TAKE 1 TABLET BY MOUTH ONCE DAILY WITH  BREAKFAST 01/16/17   [name], MD  Multiple Vitamins-Minerals (MULTIVITAMIN ADULT PO) Take by mouth.    [provider]    Allergies  Allergen Reactions  . Nitrofurantoin Nausea Only    Past Surgical History:  Procedure Laterality Date  . CESAREAN SECTION  1975  . COLONOSCOPY  2010   normal    Social History   Tobacco Use  . Smoking status: Never Smoker  . Smokeless tobacco: Never Used  Substance Use Topics  .  Alcohol use: No    Alcohol/week: 0.0 oz  . Drug use: No     Medication list has been reviewed and updated.  PHQ 2/9 Scores 01/07/2017 11/22/2016 11/28/2015 01/05/2015  PHQ - 2 Score 0 0 0 0    Physical Exam  Constitutional: She appears well-developed and well-nourished.  Cardiovascular: Normal rate, regular rhythm and normal heart sounds.  Pulmonary/Chest: Effort normal and breath sounds normal. No respiratory distress.  Abdominal: Soft. Bowel sounds are  normal. There is tenderness in the suprapubic area. There is no rebound, no guarding and no CVA tenderness.  Psychiatric: She has a normal mood and affect.  Nursing note and vitals reviewed.   BP 120/72   Pulse 87   Ht _0  (1.448 m)   Wt 158 lb (71.7 kg)   SpO2 97%   BMI 34.19 kg/m   Assessment and Plan: 1. Acute cystitis with hematuria Start Keflex and check culture - POCT urinalysis dipstick - Urine Culture - cephALEXin (KEFLEX) 500 MG capsule; Take 1 capsule (500 mg total) by mouth 4 (four) times daily for 7 days.  Dispense: 28 capsule; Refill: 0   Meds ordered this encounter  Medications  . cephALEXin (KEFLEX) 500 MG capsule    Sig: Take 1 capsule (500 mg total) by mouth 4 (four) times daily for 7 days.    Dispense:  28 capsule    Refill:  0    Partially dictated using Editor, commissioning. Any errors are unintentional.  Halina Maidens, MD [facility]  04/23/2017

## 2017-04-26 LAB — URINE CULTURE

## 2017-05-09 ENCOUNTER — Other Ambulatory Visit: Payer: Self-pay | Admitting: Internal Medicine

## 2017-05-23 ENCOUNTER — Other Ambulatory Visit: Payer: Self-pay | Admitting: Internal Medicine

## 2017-05-23 ENCOUNTER — Ambulatory Visit (INDEPENDENT_AMBULATORY_CARE_PROVIDER_SITE_OTHER): Payer: Medicare Other | Admitting: Internal Medicine

## 2017-05-23 ENCOUNTER — Encounter: Payer: Self-pay | Admitting: Internal Medicine

## 2017-05-23 VITALS — BP 142/94 | HR 76 | Ht <= 58 in | Wt 158.0 lb

## 2017-05-23 DIAGNOSIS — N3001 Acute cystitis with hematuria: Secondary | ICD-10-CM

## 2017-05-23 DIAGNOSIS — IMO0001 Reserved for inherently not codable concepts without codable children: Secondary | ICD-10-CM

## 2017-05-23 DIAGNOSIS — E1165 Type 2 diabetes mellitus with hyperglycemia: Secondary | ICD-10-CM

## 2017-05-23 LAB — POCT URINALYSIS DIPSTICK
Bilirubin, UA: NEGATIVE
Glucose, UA: NEGATIVE
Ketones, UA: NEGATIVE
Nitrite, UA: NEGATIVE
Spec Grav, UA: 1.015 (ref 1.010–1.025)
Urobilinogen, UA: 0.2 E.U./dL
pH, UA: 6 (ref 5.0–8.0)

## 2017-05-23 MED ORDER — CIPROFLOXACIN HCL 250 MG PO TABS: 250.0000 mg | ORAL_TABLET | Freq: Two times a day (BID) | ORAL | 0 refills | Status: AC

## 2017-05-23 MED ORDER — METFORMIN HCL ER 500 MG PO TB24: 500.0000 mg | ORAL_TABLET | Freq: Every day | ORAL | 1 refills | Status: DC

## 2017-05-23 NOTE — Progress Notes (Signed)
Date:  05/23/2017   Name:  Sharon York   DOB:  01/21/1946   MRN:  [id number]   Chief Complaint: Urinary Tract Infection  Urinary Tract Infection   This is a recurrent problem. The current episode started yesterday. The problem has been gradually worsening. The quality of the pain is described as burning. The pain is mild. There has been no fever. Associated symptoms include hematuria and urgency. Pertinent negatives include no chills. She has tried antibiotics for the symptoms. The treatment provided significant (complete relief of sx after antibiotics each time) relief. Her past medical history is significant for urinary stasis.  She has had UTI every month for the last three months.  She has not been back to Urology.    Review of Systems  Constitutional: Negative for chills, fatigue and fever.  Respiratory: Negative for chest tightness and shortness of breath.   Cardiovascular: Negative for chest pain and palpitations.  Gastrointestinal: Positive for constipation.  Genitourinary: Positive for difficulty urinating, dysuria, hematuria and urgency.    Patient Active Problem List   Diagnosis Date Noted  . DM (diabetes mellitus), type 2, uncontrolled ([place]) 11/06/2016  . Tinea corporis 03/17/2015  . Nocturnal cough 03/17/2015  . Abnormal gait 12/15/2014  . Altered bowel function 12/15/2014  . Blood pressure elevated without history of HTN 12/15/2014  . Hemiparesis affecting left side as late effect of cerebrovascular accident (CVA) ([place]) 12/15/2014  . Absence of bladder continence 12/15/2014  . Obstructive sleep apnea of adult 12/15/2014  . Arthritis of shoulder region, degenerative 12/15/2014  . OP (osteoporosis) 12/15/2014  . Allergic rhinitis, seasonal 12/15/2014  . Mixed incontinence 03/11/2013    Prior to Admission medications   Medication Sig Start Date End Date Taking? Authorizing Provider  ACCU-CHEK AVIVA PLUS test strip USE UP TO 4 TIMES DAILY 05/09/17  Yes [name], MD  ACCU-CHEK FASTCLIX LANCETS MISC 1 each by Does not apply route 2 (two) times daily. 11/26/16  Yes [name], MD  alendronate (FOSAMAX) 70 MG tablet Take 1 tablet (70 mg total) by mouth once a week. Take with a full glass of water on an empty stomach. 02/06/17  Yes [name], MD  aspirin 81 MG chewable tablet Chew 1 tablet by mouth daily.   Yes [provider]  baclofen (LIORESAL) 10 MG tablet Take 1 tablet (10 mg total) by mouth 2 (two) times daily. 02/06/17  Yes [name], MD  blood glucose meter kit and supplies KIT Dispense based on patient and insurance preference. Use up to four times daily as directed. (FOR ICD-9 250.00, 250.01). 04/05/17  Yes [name], MD  Calcium Carbonate-Vitamin D (CALCIUM 500 + D) 500-125 MG-UNIT TABS Take by mouth.   Yes [provider]  fesoterodine (TOVIAZ) 4 MG TB24 tablet Take 1 tablet (4 mg total) by mouth daily. 02/18/17  Yes MacDiarmid, Nicki Reaper, MD  metFORMIN (GLUCOPHAGE-XR) 500 MG 24 hr tablet TAKE 1 TABLET BY MOUTH ONCE DAILY WITH  BREAKFAST 01/16/17  Yes [name], MD  Multiple Vitamins-Minerals (MULTIVITAMIN ADULT PO) Take by mouth.   Yes [provider]    Allergies  Allergen Reactions  . Nitrofurantoin Nausea Only    Past Surgical History:  Procedure Laterality Date  . CESAREAN SECTION  1975  . COLONOSCOPY  2010   normal    Social History   Tobacco Use  . Smoking status: Never Smoker  . Smokeless tobacco: Never Used  Substance Use Topics  .  Alcohol use: No    Alcohol/week: 0.0 oz  . Drug use: No     Medication list has been reviewed and updated.  PHQ 2/9 Scores 01/07/2017 11/22/2016 11/28/2015 01/05/2015  PHQ - 2 Score 0 0 0 0    Physical Exam  Constitutional: She appears well-developed and well-nourished.  Cardiovascular: Normal rate, regular rhythm and normal heart sounds.  Pulmonary/Chest: Effort normal and breath sounds normal. No respiratory distress.    Abdominal: Soft. Bowel sounds are normal. There is tenderness in the suprapubic area. There is no rebound, no guarding and no CVA tenderness.  Psychiatric: She has a normal mood and affect.  Nursing note and vitals reviewed.   BP (!) 142/94   Pulse 76   Ht _0  (1.448 m)   Wt 158 lb (71.7 kg)   SpO2 98%   BMI 34.19 kg/m   Assessment and Plan: 1. Acute cystitis with hematuria Will advise on culture results Recommend follow up with Urology - POCT Urinalysis Dipstick - POC urinalysis w microscopic (non auto) - ciprofloxacin (CIPRO) 250 MG tablet; Take 1 tablet (250 mg total) by mouth 2 (two) times daily for 7 days.  Dispense: 14 tablet; Refill: 0 - Urine Culture  2. Uncontrolled type 2 diabetes mellitus without complication, without long-term current use of insulin (HCC) - metFORMIN (GLUCOPHAGE-XR) 500 MG 24 hr tablet; Take 1 tablet (500 mg total) by mouth daily with breakfast.  Dispense: 90 tablet; Refill: 1   Meds ordered this encounter  Medications  . ciprofloxacin (CIPRO) 250 MG tablet    Sig: Take 1 tablet (250 mg total) by mouth 2 (two) times daily for 7 days.    Dispense:  14 tablet    Refill:  0  . metFORMIN (GLUCOPHAGE-XR) 500 MG 24 hr tablet    Sig: Take 1 tablet (500 mg total) by mouth daily with breakfast.    Dispense:  90 tablet    Refill:  1    Partially dictated using Editor, commissioning. Any errors are unintentional.  Halina Maidens, MD [facility]  05/23/2017

## 2017-05-25 LAB — URINE CULTURE

## 2017-05-27 NOTE — Progress Notes (Signed)
LVMTCB

## 2017-05-29 NOTE — Progress Notes (Signed)
LVMTCB again- unable to speak to pt.

## 2017-05-31 ENCOUNTER — Other Ambulatory Visit: Payer: Self-pay | Admitting: Internal Medicine

## 2017-06-11 ENCOUNTER — Encounter: Payer: Self-pay | Admitting: Internal Medicine

## 2017-06-11 ENCOUNTER — Ambulatory Visit: Payer: Medicare Other | Admitting: Internal Medicine

## 2017-06-11 VITALS — BP 130/78 | HR 81 | Ht <= 58 in | Wt 155.0 lb

## 2017-06-11 DIAGNOSIS — Z1159 Encounter for screening for other viral diseases: Secondary | ICD-10-CM | POA: Diagnosis not present

## 2017-06-11 DIAGNOSIS — N3946 Mixed incontinence: Secondary | ICD-10-CM | POA: Diagnosis not present

## 2017-06-11 DIAGNOSIS — Z23 Encounter for immunization: Secondary | ICD-10-CM | POA: Diagnosis not present

## 2017-06-11 DIAGNOSIS — E1165 Type 2 diabetes mellitus with hyperglycemia: Secondary | ICD-10-CM

## 2017-06-11 DIAGNOSIS — I69354 Hemiplegia and hemiparesis following cerebral infarction affecting left non-dominant side: Secondary | ICD-10-CM | POA: Diagnosis not present

## 2017-06-11 NOTE — Patient Instructions (Addendum)

## 2017-06-11 NOTE — Progress Notes (Signed)
Date:  06/11/2017   Name:  Sharon York   DOB:  01-23-1946   MRN:  [id number]   Chief Complaint: Diabetes (106 bs this morning) Diabetes  She presents for her follow-up diabetic visit. She has type 2 diabetes mellitus. Her disease course has been stable. Pertinent negatives for hypoglycemia include no dizziness or headaches. Associated symptoms include weakness. Pertinent negatives for diabetes include no chest pain and no fatigue. Current diabetic treatment includes oral agent (monotherapy). She is compliant with treatment all of the time. She monitors blood glucose at home 1-2 x per day. There is no change in her home blood glucose trend. Her breakfast blood glucose is taken between 6-7 am. Her breakfast blood glucose range is generally 90-110 mg/dl. An ACE inhibitor/angiotensin II receptor blocker is not being taken. Eye exam is current.  Hyperlipidemia  This is a chronic problem. The problem is uncontrolled. Recent lipid tests were reviewed and are high (last lipids in 2015 were high - pt has never taken medication). Exacerbating diseases include diabetes. Pertinent negatives include no chest pain or shortness of breath. She is currently on no antihyperlipidemic treatment. Risk factors for coronary artery disease include diabetes mellitus and dyslipidemia.   Urinary urgency - improved after treatment for UTI and taking toviaz.  Currently not having any sx.  CVA - dense persistent left hemiparesis.  She takes Baclofen for spasms daily.  She is ambulating today with a rollator. She has an old electric wheel chair at home that is broken and can't be repaired.  She will need to try to get a new one.  I advised her to call the maker of her [facility] and have them send me the needed forms.  Lab Results  Component Value Date   HGBA1C 6.6 (H) 02/06/2017   Lab Results  Component Value Date   CHOL 230 (A) 09/18/2013   HDL 69 09/18/2013   LDLCALC 136 09/18/2013   TRIG 126 09/18/2013     Review of  Systems  Constitutional: Negative for chills, fatigue and fever.  Respiratory: Negative for chest tightness and shortness of breath.   Cardiovascular: Negative for chest pain and palpitations.  Gastrointestinal: Negative for abdominal pain.  Genitourinary: Negative for difficulty urinating and dysuria.  Musculoskeletal: Positive for arthralgias (shoulder).  Skin: Negative for color change and rash.  Neurological: Positive for weakness. Negative for dizziness and headaches.  Hematological: Negative for adenopathy.  Psychiatric/Behavioral: Negative for dysphoric mood.    Patient Active Problem List   Diagnosis Date Noted  . DM (diabetes mellitus), type 2, uncontrolled ([place]) 11/06/2016  . Tinea corporis 03/17/2015  . Nocturnal cough 03/17/2015  . Abnormal gait 12/15/2014  . Altered bowel function 12/15/2014  . Blood pressure elevated without history of HTN 12/15/2014  . Hemiparesis affecting left side as late effect of cerebrovascular accident (CVA) ([place]) 12/15/2014  . Absence of bladder continence 12/15/2014  . Obstructive sleep apnea of adult 12/15/2014  . Arthritis of shoulder region, degenerative 12/15/2014  . OP (osteoporosis) 12/15/2014  . Allergic rhinitis, seasonal 12/15/2014  . Mixed incontinence 03/11/2013    Prior to Admission medications   Medication Sig Start Date End Date Taking? Authorizing Provider  ACCU-CHEK AVIVA PLUS test strip  USE 1 TEST STRIP UP TO 4 TIMES DAILY 05/31/17   [name], MD  ACCU-CHEK FASTCLIX LANCETS MISC 1 each by Does not apply route 2 (two) times daily. 11/26/16   [name], MD  alendronate (FOSAMAX) 70 MG tablet Take 1 tablet (  70 mg total) by mouth once a week. Take with a full glass of water on an empty stomach. 02/06/17   [name], MD  aspirin 81 MG chewable tablet Chew 1 tablet by mouth daily.    [provider]  baclofen (LIORESAL) 10 MG tablet Take 1 tablet (10 mg total) by mouth 2 (two) times daily. 02/06/17    [name], MD  blood glucose meter kit and supplies KIT Dispense based on patient and insurance preference. Use up to four times daily as directed. (FOR ICD-9 250.00, 250.01). 04/05/17   [name], MD  Calcium Carbonate-Vitamin D (CALCIUM 500 + D) 500-125 MG-UNIT TABS Take by mouth.    [provider]  fesoterodine (TOVIAZ) 4 MG TB24 tablet Take 1 tablet (4 mg total) by mouth daily. 02/18/17   Bjorn Loser, MD  metFORMIN (GLUCOPHAGE-XR) 500 MG 24 hr tablet Take 1 tablet (500 mg total) by mouth daily with breakfast. 05/23/17   [name], MD  Multiple Vitamins-Minerals (MULTIVITAMIN ADULT PO) Take by mouth.    [provider]    Allergies  Allergen Reactions  . Nitrofurantoin Nausea Only    Past Surgical History:  Procedure Laterality Date  . CESAREAN SECTION  1975  . COLONOSCOPY  2010   normal    Social History   Tobacco Use  . Smoking status: Never Smoker  . Smokeless tobacco: Never Used  Substance Use Topics  . Alcohol use: No    Alcohol/week: 0.0 oz  . Drug use: No     Medication list has been reviewed and updated.  PHQ 2/9 Scores 01/07/2017 11/22/2016 11/28/2015 01/05/2015  PHQ - 2 Score 0 0 0 0    Physical Exam  Constitutional: She is oriented to person, place, and time. She appears well-developed. No distress.  HENT:  Head: Normocephalic and atraumatic.  Neck: Normal range of motion. Neck supple.  Cardiovascular: Normal rate, regular rhythm and normal heart sounds.  Pulmonary/Chest: Effort normal and breath sounds normal. No respiratory distress. She has no wheezes.  Abdominal: Soft. Bowel sounds are normal.  Musculoskeletal: Normal range of motion. She exhibits edema (trace ankle).  Neurological: She is alert and oriented to person, place, and time. She displays atrophy (LUE). Gait abnormal.  Skin: Skin is warm and dry. No rash noted.  Psychiatric: She has a normal mood and affect. Her behavior is normal. Thought content  normal.  Nursing note and vitals reviewed.   BP 130/78   Pulse 81   Ht _0  (1.448 m)   Wt 155 lb (70.3 kg)   SpO2 97%   BMI 33.54 kg/m   Assessment and Plan: 1. Uncontrolled type 2 diabetes mellitus with hyperglycemia (HCC) Controlled, continue metformin Pt working on dietary changes to help with weight loss - Hemoglobin A1c  2. Hemiparesis affecting left side as late effect of cerebrovascular accident (CVA) ([place]) Stable Will do paperwork for WC when received - Lipid panel  3. Need for hepatitis C screening test - Hepatitis C antibody  4. Need for pneumococcal vaccination - Pneumococcal polysaccharide vaccine 23-valent greater than or equal to 2yo subcutaneous/IM  5. Mixed incontinence Continue Toviaz   No orders of the defined types were placed in this encounter.   Partially dictated using Editor, commissioning. Any errors are unintentional.  Halina Maidens, MD [facility]  06/11/2017

## 2017-06-12 ENCOUNTER — Telehealth: Payer: Self-pay

## 2017-06-12 ENCOUNTER — Other Ambulatory Visit: Payer: Self-pay | Admitting: Internal Medicine

## 2017-06-12 DIAGNOSIS — E785 Hyperlipidemia, unspecified: Principal | ICD-10-CM

## 2017-06-12 DIAGNOSIS — E1169 Type 2 diabetes mellitus with other specified complication: Secondary | ICD-10-CM | POA: Insufficient documentation

## 2017-06-12 LAB — HEMOGLOBIN A1C
Est. average glucose Bld gHb Est-mCnc: 134 mg/dL
Hgb A1c MFr Bld: 6.3 % — ABNORMAL HIGH (ref 4.8–5.6)

## 2017-06-12 LAB — HEPATITIS C ANTIBODY: Hep C Virus Ab: <0.1 s/co ratio (ref 0.0–0.9)

## 2017-06-12 LAB — LIPID PANEL
Chol/HDL Ratio: 3.5 ratio (ref 0.0–4.4)
Cholesterol, Total: 202 mg/dL — ABNORMAL HIGH (ref 100–199)
HDL: 57 mg/dL (ref >39)
LDL Calculated: 103 mg/dL — ABNORMAL HIGH (ref 0–99)
Triglycerides: 211 mg/dL — ABNORMAL HIGH (ref 0–149)
VLDL Cholesterol Cal: 42 mg/dL — ABNORMAL HIGH (ref 5–40)

## 2017-06-12 MED ORDER — ATORVASTATIN CALCIUM 10 MG PO TABS: 10.0000 mg | ORAL_TABLET | Freq: Every day | ORAL | 3 refills | Status: DC

## 2017-06-12 NOTE — Telephone Encounter (Signed)
Healthcare member services called stating patient is requesting to get a powered wheel chair. Needs Rx sent over from us to [facility] to start the process in getting this chair for her.   Please Advise.

## 2017-06-13 NOTE — Telephone Encounter (Signed)
Faxed Rx to [facility] @ [phone]. Received confirmation fax. Will wait for paperwork to call Sharon York in for face to face.

## 2017-06-13 NOTE — Telephone Encounter (Signed)
Rx written.  The next step is for the providing company to send a form for completion that will likely also require a face to face visit from Ms Sharon York (she was told this at her appt).

## 2017-06-17 ENCOUNTER — Encounter: Payer: Self-pay | Admitting: Urology

## 2017-06-17 ENCOUNTER — Ambulatory Visit: Payer: Medicare Other | Admitting: Urology

## 2017-06-17 VITALS — BP 131/77 | HR 98 | Ht <= 58 in | Wt 155.9 lb

## 2017-06-17 DIAGNOSIS — R3129 Other microscopic hematuria: Secondary | ICD-10-CM

## 2017-06-17 NOTE — Progress Notes (Signed)
06/17/2017 2:44 PM   Sharon York August 04, 1945 [id number]  Referring provider: [name], MD [address] [place], [place] [address]  Chief Complaint  Patient presents with  . Recurrent UTI    HPI: Sharon York: Patient states that she has had urinary incontinence since she had her stroke in 1999. She has been on Myrbetriq 50 mg daily for two years. She is not finding it helpful. She has also tried a pessary in the past without reduction in incontinence.  She was noted to have a grade 2 cystocele  Last visit The patient did not wish to have percutaneous tibial nerve stimulation and physical therapy because of cost. She did not want to try anticholinergic therapydue to potential side effects. She still wears one pad per day. Frequency is stable. Toviaz given  Frequency and urgency much better.  She is almost completely dry and very happy.  Clinically not infected    Today The patient is still doing very well on [place] with reduced frequency and incontinence.  She noticed some blood on the pad once a few months ago.  She has been documented as having microscopic hematuria.  She has never smoked.  She takes daily aspirin but no other blood thinners  She has not had a kidney stone or previous GU surgery  In the last several months she had one positive urine culture and one negative urine culture and was unable to leave a sample today    06/17/2017 2:54 PM   Sharon York 02-13-1946 [id number]  Referring provider: [name], MD [address] [place], [place] [address]  Chief Complaint  Patient presents with  . Recurrent UTI    HPI:      PMH: Past Medical History:  Diagnosis Date  . Diabetes mellitus without complication ([place])   . Osteoporosis   . Overactive bladder   . Stroke [facility])     Surgical History: Past Surgical History:  Procedure Laterality Date  . CESAREAN SECTION  1975  . COLONOSCOPY  2010   normal    Home  Medications:  Allergies as of 06/17/2017      Reactions   Nitrofurantoin Nausea Only      Medication List        Accurate as of 06/17/17  2:54 PM. Always use your most recent med list.          ACCU-CHEK AVIVA PLUS test strip Generic drug:  glucose blood USE 1 TEST STRIP UP TO 4 TIMES DAILY   ACCU-CHEK FASTCLIX LANCETS Misc 1 each by Does not apply route 2 (two) times daily.   alendronate 70 MG tablet Commonly known as:  FOSAMAX Take 1 tablet (70 mg total) by mouth once a week. Take with a full glass of water on an empty stomach.   aspirin 81 MG chewable tablet Chew 1 tablet by mouth daily.   atorvastatin 10 MG tablet Commonly known as:  LIPITOR Take 1 tablet (10 mg total) by mouth daily.   baclofen 10 MG tablet Commonly known as:  LIORESAL Take 1 tablet (10 mg total) by mouth 2 (two) times daily.   blood glucose meter kit and supplies Kit Dispense based on patient and insurance preference. Use up to four times daily as directed. (FOR ICD-9 250.00, 250.01).   CALCIUM 500 + D 500-125 MG-UNIT Tabs Generic drug:  Calcium Carbonate-Vitamin D Take by mouth.   fesoterodine 4 MG Tb24 tablet Commonly known as:  TOVIAZ Take 1 tablet (4 mg  total) by mouth daily.   Magnesium 100 MG Tabs Take by mouth.   metFORMIN 500 MG 24 hr tablet Commonly known as:  GLUCOPHAGE-XR Take 1 tablet (500 mg total) by mouth daily with breakfast.   MULTIVITAMIN ADULT PO Take by mouth.       Allergies:  Allergies  Allergen Reactions  . Nitrofurantoin Nausea Only    Family History: Family History  Problem Relation Age of Onset  . Diabetes Mother   . Thyroid disease Mother   . Breast cancer Maternal Aunt   . Breast cancer Cousin        mat cousin  . Prostate cancer Brother   . Kidney failure Brother   . Kidney cancer Son   . Bladder Cancer Neg Hx     Social History:  reports that  has never smoked. she has never used smokeless tobacco. She reports that she does not drink  alcohol or use drugs.  ROS: UROLOGY Frequent Urination?: No Hard to postpone urination?: Yes Burning/pain with urination?: No Get up at night to urinate?: No Leakage of urine?: No Urine stream starts and stops?: No Trouble starting stream?: No Do you have to strain to urinate?: No Blood in urine?: No Urinary tract infection?: No Sexually transmitted disease?: No Injury to kidneys or bladder?: No Painful intercourse?: No Weak stream?: No Currently pregnant?: No Vaginal bleeding?: No Last menstrual period?: n  Gastrointestinal Nausea?: No Vomiting?: No Indigestion/heartburn?: No Diarrhea?: No Constipation?: No  Constitutional Fever: No Night sweats?: No Weight loss?: No Fatigue?: No  Skin Skin rash/lesions?: No Itching?: No  Eyes Blurred vision?: No Double vision?: No  Ears/Nose/Throat Sore throat?: Yes Sinus problems?: Yes  Hematologic/Lymphatic Swollen glands?: No Easy bruising?: No  Cardiovascular Leg swelling?: No Chest pain?: No  Respiratory Cough?: No Shortness of breath?: No  Endocrine Excessive thirst?: No  Musculoskeletal Back pain?: No Joint pain?: No  Neurological Headaches?: No Dizziness?: No  Psychologic Depression?: No Anxiety?: No  Physical Exam: BP 131/77 (BP Location: Right Arm, Patient Position: Sitting, Cuff Size: Normal)   Pulse 98   Ht _0  (1.448 m)   Wt 155 lb 14.4 oz (70.7 kg)   BMI 33.74 kg/m   Constitutional:  Alert and oriented, No acute distress.   Laboratory Data: Lab Results  Component Value Date   WBC 7.5 02/10/2015   HGB 12.7 02/10/2015   HCT 38.7 02/10/2015   MCV 71.2 (L) 02/10/2015   PLT 240 02/10/2015    Lab Results  Component Value Date   CREATININE 0.65 02/06/2017    No results found for: PSA  No results found for: TESTOSTERONE  Lab Results  Component Value Date   HGBA1C 6.3 (H) 06/11/2017    Urinalysis    Component Value Date/Time   COLORURINE YELLOW (A) 02/10/2015 2358     APPEARANCEUR Clear 02/18/2017 1204   LABSPEC 1.017 02/10/2015 2358   PHURINE 6.0 02/10/2015 2358   GLUCOSEU Negative 02/18/2017 1204   HGBUR NEGATIVE 02/10/2015 2358   BILIRUBINUR neg 05/23/2017 0926   BILIRUBINUR Negative 02/18/2017 1204   KETONESUR NEGATIVE 02/10/2015 2358   PROTEINUR 100++ 05/23/2017 0926   PROTEINUR Negative 02/18/2017 1204   PROTEINUR NEGATIVE 02/10/2015 2358   UROBILINOGEN 0.2 05/23/2017 0926   NITRITE negative 05/23/2017 0926   NITRITE Negative 02/18/2017 1204   NITRITE NEGATIVE 02/10/2015 2358   LEUKOCYTESUR Large (3+) (A) 05/23/2017 0926   LEUKOCYTESUR Negative 02/18/2017 1204    Pertinent Imaging:   Assessment & Plan:  Patient will return  for cysto and ct scan  There are no diagnoses linked to this encounter.  No Follow-up on file.  Reece Packer, MD  [facility] [address], [place], [place] [address] [phone]     PMH: Past Medical History:  Diagnosis Date  . Diabetes mellitus without complication ([place])   . Osteoporosis   . Overactive bladder   . Stroke [facility])     Surgical History: Past Surgical History:  Procedure Laterality Date  . CESAREAN SECTION  1975  . COLONOSCOPY  2010   normal    Home Medications:  Allergies as of 06/17/2017      Reactions   Nitrofurantoin Nausea Only      Medication List        Accurate as of 06/17/17  2:44 PM. Always use your most recent med list.          ACCU-CHEK AVIVA PLUS test strip Generic drug:  glucose blood USE 1 TEST STRIP UP TO 4 TIMES DAILY   ACCU-CHEK FASTCLIX LANCETS Misc 1 each by Does not apply route 2 (two) times daily.   alendronate 70 MG tablet Commonly known as:  FOSAMAX Take 1 tablet (70 mg total) by mouth once a week. Take with a full glass of water on an empty stomach.   aspirin 81 MG chewable tablet Chew 1 tablet by mouth daily.   atorvastatin 10 MG tablet Commonly known as:  LIPITOR Take 1 tablet (10 mg  total) by mouth daily.   baclofen 10 MG tablet Commonly known as:  LIORESAL Take 1 tablet (10 mg total) by mouth 2 (two) times daily.   blood glucose meter kit and supplies Kit Dispense based on patient and insurance preference. Use up to four times daily as directed. (FOR ICD-9 250.00, 250.01).   CALCIUM 500 + D 500-125 MG-UNIT Tabs Generic drug:  Calcium Carbonate-Vitamin D Take by mouth.   fesoterodine 4 MG Tb24 tablet Commonly known as:  TOVIAZ Take 1 tablet (4 mg total) by mouth daily.   Magnesium 100 MG Tabs Take by mouth.   metFORMIN 500 MG 24 hr tablet Commonly known as:  GLUCOPHAGE-XR Take 1 tablet (500 mg total) by mouth daily with breakfast.   MULTIVITAMIN ADULT PO Take by mouth.       Allergies:  Allergies  Allergen Reactions  . Nitrofurantoin Nausea Only    Family History: Family History  Problem Relation Age of Onset  . Diabetes Mother   . Thyroid disease Mother   . Breast cancer Maternal Aunt   . Breast cancer Cousin        mat cousin  . Prostate cancer Brother   . Kidney failure Brother   . Kidney cancer Son   . Bladder Cancer Neg Hx     Social History:  reports that  has never smoked. she has never used smokeless tobacco. She reports that she does not drink alcohol or use drugs.  ROS:                                        Physical Exam: BP 131/77 (BP Location: Right Arm, Patient Position: Sitting, Cuff Size: Normal)   Pulse 98   Ht _0  (1.448 m)   Wt 155 lb 14.4 oz (70.7 kg)   BMI 33.74 kg/m   Constitutional:  Alert and oriented, No acute distress.   Laboratory Data: Lab Results  Component Value Date   WBC 7.5 02/10/2015   HGB 12.7 02/10/2015   HCT 38.7 02/10/2015   MCV 71.2 (L) 02/10/2015   PLT 240 02/10/2015    Lab Results  Component Value Date   CREATININE 0.65 02/06/2017    No results found for: PSA  No results found for: TESTOSTERONE  Lab Results  Component Value Date   HGBA1C 6.3  (H) 06/11/2017    Urinalysis    Component Value Date/Time   COLORURINE YELLOW (A) 02/10/2015 2358   APPEARANCEUR Clear 02/18/2017 1204   LABSPEC 1.017 02/10/2015 2358   PHURINE 6.0 02/10/2015 2358   GLUCOSEU Negative 02/18/2017 1204   HGBUR NEGATIVE 02/10/2015 2358   BILIRUBINUR neg 05/23/2017 0926   BILIRUBINUR Negative 02/18/2017 [place] 02/10/2015 2358   PROTEINUR 100++ 05/23/2017 0926   PROTEINUR Negative 02/18/2017 1204   PROTEINUR NEGATIVE 02/10/2015 2358   UROBILINOGEN 0.2 05/23/2017 0926   NITRITE negative 05/23/2017 0926   NITRITE Negative 02/18/2017 1204   NITRITE NEGATIVE 02/10/2015 2358   LEUKOCYTESUR Large (3+) (A) 05/23/2017 0926   LEUKOCYTESUR Negative 02/18/2017 1204    Pertinent Imaging: none  Assessment & Plan: The workup for hematuria was described.  The patient will get a CT scan hematuria workup and come back for cystoscopy.  A double speculum examination would be very reasonable simultaneously to make certain she does not have blood from the cervix.  Recent renal function 4 months ago acceptable for CT scan  There are no diagnoses linked to this encounter.  No Follow-up on file.  Reece Packer, MD  [facility] [address] [place], [place] [address] [phone]

## 2017-06-20 ENCOUNTER — Telehealth: Payer: Self-pay

## 2017-06-20 NOTE — Telephone Encounter (Signed)
Patient called stating she was DENIED for the power wheel chair through [facility]. Was denied because that medical supply store is not in her insurance's network. Called her insurance and she found out she needs to use [facility]. Needs Rx for power wheel chair sent to them.  FAX# [phone]  Please Advise.

## 2017-06-22 ENCOUNTER — Other Ambulatory Visit: Payer: Self-pay

## 2017-06-22 ENCOUNTER — Encounter: Payer: Self-pay | Admitting: Gynecology

## 2017-06-22 ENCOUNTER — Ambulatory Visit
Admission: EM | Admit: 2017-06-22 | Discharge: 2017-06-22 | Disposition: A | Discharge status: Home / Self Care | Payer: Medicare Other | Attending: Emergency Medicine | Admitting: Emergency Medicine

## 2017-06-22 DIAGNOSIS — R319 Hematuria, unspecified: Secondary | ICD-10-CM | POA: Diagnosis not present

## 2017-06-22 DIAGNOSIS — N390 Urinary tract infection, site not specified: Secondary | ICD-10-CM

## 2017-06-22 LAB — CBC WITH DIFFERENTIAL/PLATELET
Basophils Absolute: 0.2 10*3/uL — ABNORMAL HIGH (ref 0–0.1)
Basophils Relative: 2 %
Eosinophils Absolute: 0.1 10*3/uL (ref 0–0.7)
Eosinophils Relative: 1 %
HCT: 38.1 % (ref 35.0–47.0)
Hemoglobin: 12.6 g/dL (ref 12.0–16.0)
Lymphocytes Relative: 26 %
Lymphs Abs: 2.5 10*3/uL (ref 1.0–3.6)
MCH: 23.6 pg — ABNORMAL LOW (ref 26.0–34.0)
MCHC: 32.9 g/dL (ref 32.0–36.0)
MCV: 71.5 fL — ABNORMAL LOW (ref 80.0–100.0)
Monocytes Absolute: 0.5 10*3/uL (ref 0.2–0.9)
Monocytes Relative: 6 %
Neutro Abs: 6.2 10*3/uL (ref 1.4–6.5)
Neutrophils Relative %: 65 %
Platelets: 234 10*3/uL (ref 150–440)
RBC: 5.33 MIL/uL — ABNORMAL HIGH (ref 3.80–5.20)
RDW: 16 % — ABNORMAL HIGH (ref 11.5–14.5)
WBC: 9.6 10*3/uL (ref 3.6–11.0)

## 2017-06-22 LAB — URINALYSIS, COMPLETE (UACMP) WITH MICROSCOPIC
Bilirubin Urine: NEGATIVE
Glucose, UA: NEGATIVE mg/dL
Ketones, ur: NEGATIVE mg/dL
Nitrite: NEGATIVE
Protein, ur: 30 mg/dL — AB
Specific Gravity, Urine: 1.015 (ref 1.005–1.030)
pH: >9 — ABNORMAL HIGH (ref 5.0–8.0)

## 2017-06-22 MED ORDER — CEFPODOXIME PROXETIL 200 MG PO TABS: 200.0000 mg | ORAL_TABLET | Freq: Two times a day (BID) | ORAL | 0 refills | Status: AC

## 2017-06-22 NOTE — ED Triage Notes (Signed)
Patient c/o vaginal bleeding x 3 months on and off.. Per patient was told by her primary care to see a Urologist. Pt. Stated has not given an appointment as yet.

## 2017-06-22 NOTE — ED Provider Notes (Signed)
HPI  SUBJECTIVE:  Sharon York is a 72 y.o. female who presents with possible intermittent vaginal bleeding since November.  She is not sure if it is vaginal bleeding or hematuria.  States that she has sees bright red blood on her panty liners.  Reports that the bleeding started this morning.  She also reports urinary urgency, frequency.  She has not tried anything for this although she states that the symptoms improve after being treated for UTIs with antibiotics.  Last course of antibiotics was with 7 days of Cipro on January 3.  There are no aggravating factors.  She denies passing clots vaginally, soaking through pads.  No dysuria, cloudy odorous urine.  No epistaxis, bruising easily, melena, hematochezia.  She had a normal bowel movement earlier today.  No fevers.  No abdominal, back, pelvic pain.  No shortness of breath, chest pain, dyspnea on exertion, dizziness, syncope.  No vaginal itching, odor, discharge, rash.  She has not been sexually active since 1995.  She has talked to her primary care physician about this and has also seen urology for this.  Per chart review it appears that the symptoms are thought to be from UTIs with hematuria.  She has a pending CT scan and a cystoscopy  for hematuria prior to her urology follow-up on 3/4.  She states that urology is planning on sending her to GYN but states that she is not sure who she is supposed to see or when she is supposed to see them.  She has a past medical history of stroke with residual left arm and leg weakness, UTIs with hematuria, diabetes, uterine fibroids.  She takes aspirin daily she denies any other anticoagulant antiplatelet use.  No history of hypertension, hypercholesterolemia, STDs, BV, yeast, cancer, coagulopathy.  PMD: Dr. Army Melia.    Past Medical History:  Diagnosis Date  . Diabetes mellitus without complication ([place])   . Osteoporosis   . Overactive bladder   . Stroke [facility])     Past Surgical History:  Procedure  Laterality Date  . CESAREAN SECTION  1975  . COLONOSCOPY  2010   normal    Family History  Problem Relation Age of Onset  . Diabetes Mother   . Thyroid disease Mother   . Breast cancer Maternal Aunt   . Breast cancer Cousin        mat cousin  . Prostate cancer Brother   . Kidney failure Brother   . Kidney cancer Son   . Bladder Cancer Neg Hx     Social History   Tobacco Use  . Smoking status: Never Smoker  . Smokeless tobacco: Never Used  Substance Use Topics  . Alcohol use: No    Alcohol/week: 0.0 oz  . Drug use: No    No current facility-administered medications for this encounter.   Current Outpatient Medications:  .  ACCU-CHEK AVIVA PLUS test strip,  USE 1 TEST STRIP UP TO 4 TIMES DAILY, Disp: 400 each, Rfl: 3 .  ACCU-CHEK FASTCLIX LANCETS MISC, 1 each by Does not apply route 2 (two) times daily., Disp: 100 each, Rfl: 3 .  alendronate (FOSAMAX) 70 MG tablet, Take 1 tablet (70 mg total) by mouth once a week. Take with a full glass of water on an empty stomach., Disp: 4 tablet, Rfl: 12 .  aspirin 81 MG chewable tablet, Chew 1 tablet by mouth daily., Disp: , Rfl:  .  atorvastatin (LIPITOR) 10 MG tablet, Take 1 tablet (10 mg total) by mouth daily., Disp:  90 tablet, Rfl: 3 .  baclofen (LIORESAL) 10 MG tablet, Take 1 tablet (10 mg total) by mouth 2 (two) times daily., Disp: 60 tablet, Rfl: 12 .  blood glucose meter kit and supplies KIT, Dispense based on patient and insurance preference. Use up to four times daily as directed. (FOR ICD-9 250.00, 250.01)., Disp: 1 each, Rfl: 0 .  Calcium Carbonate-Vitamin D (CALCIUM 500 + D) 500-125 MG-UNIT TABS, Take by mouth., Disp: , Rfl:  .  fesoterodine (TOVIAZ) 4 MG TB24 tablet, Take 1 tablet (4 mg total) by mouth daily., Disp: 30 tablet, Rfl: 11 .  Magnesium 100 MG TABS, Take by mouth., Disp: , Rfl:  .  metFORMIN (GLUCOPHAGE-XR) 500 MG 24 hr tablet, Take 1 tablet (500 mg total) by mouth daily with breakfast., Disp: 90 tablet, Rfl: 1 .   Multiple Vitamins-Minerals (MULTIVITAMIN ADULT PO), Take by mouth., Disp: , Rfl:  .  cefpodoxime (VANTIN) 200 MG tablet, Take 1 tablet (200 mg total) by mouth 2 (two) times daily for 7 days., Disp: 14 tablet, Rfl: 0  Allergies  Allergen Reactions  . Nitrofurantoin Nausea Only     ROS  As noted in HPI.   Physical Exam  Pulse 95   Resp 16   Wt 155 lb (70.3 kg)   SpO2 97%   BMI 33.54 kg/m   Constitutional: Well developed, well nourished, no acute distress Eyes:  EOMI, conjunctiva normal bilaterally HENT: Normocephalic, atraumatic,mucus membranes moist Respiratory: Normal inspiratory effort Cardiovascular: Normal rate GI: nondistended.  Soft.  Nontender.  No suprapubic or flank tenderness. Back: No CVAT GU: Normal external genitalia.  No blood noted from the urethra.  No evidence of trauma.  No blood in the vaginal vault, no discharge, no odor.  Unable to completely visualize cervix- pt on bedpan on stretcher.  No CMT.  No adnexal masses or tenderness..  Chaperone present during exam. skin: No rash, skin intact Musculoskeletal: no deformities Neurologic: Alert & oriented x 3, no focal neuro deficits Psychiatric: Speech and behavior appropriate   ED Course   Medications - No data to display  Orders Placed This Encounter  Procedures  . Pelvic exam    Standing Status:   Standing    Number of Occurrences:   1  . Urine culture    Standing Status:   Standing    Number of Occurrences:   1    Order Specific Question:   Patient immune status    Answer:   Normal  . Urinalysis, Complete w Microscopic    Standing Status:   Standing    Number of Occurrences:   1  . CBC with Differential    Standing Status:   Standing    Number of Occurrences:   1    Results for orders placed or performed during the hospital encounter of 06/22/17 (from the past 24 hour(s))  Urinalysis, Complete w Microscopic     Status: Abnormal   Collection Time: 06/22/17 12:22 PM  Result Value Ref Range    Color, Urine STRAW (A) YELLOW   APPearance CLOUDY (A) CLEAR   Specific Gravity, Urine 1.015 1.005 - 1.030   pH >9.0 (H) 5.0 - 8.0   Glucose, UA NEGATIVE NEGATIVE mg/dL   Hgb urine dipstick LARGE (A) NEGATIVE   Bilirubin Urine NEGATIVE NEGATIVE   Ketones, ur NEGATIVE NEGATIVE mg/dL   Protein, ur 30 (A) NEGATIVE mg/dL   Nitrite NEGATIVE NEGATIVE   Leukocytes, UA MODERATE (A) NEGATIVE   Squamous Epithelial / LPF 0-5 (  A) NONE SEEN   WBC, UA TOO NUMEROUS TO COUNT 0 - 5 WBC/hpf   RBC / HPF TOO NUMEROUS TO COUNT 0 - 5 RBC/hpf   Bacteria, UA MANY (A) NONE SEEN   Urine-Other LESS THAN 10 mL OF URINE SUBMITTED   CBC with Differential     Status: Abnormal   Collection Time: 06/22/17 12:41 PM  Result Value Ref Range   WBC 9.6 3.6 - 11.0 K/uL   RBC 5.33 (H) 3.80 - 5.20 MIL/uL   Hemoglobin 12.6 12.0 - 16.0 g/dL   HCT 38.1 35.0 - 47.0 %   MCV 71.5 (L) 80.0 - 100.0 fL   MCH 23.6 (L) 26.0 - 34.0 pg   MCHC 32.9 32.0 - 36.0 g/dL   RDW 16.0 (H) 11.5 - 14.5 %   Platelets 234 150 - 440 K/uL   Neutrophils Relative % 65 %   Neutro Abs 6.2 1.4 - 6.5 K/uL   Lymphocytes Relative 26 %   Lymphs Abs 2.5 1.0 - 3.6 K/uL   Monocytes Relative 6 %   Monocytes Absolute 0.5 0.2 - 0.9 K/uL   Eosinophils Relative 1 %   Eosinophils Absolute 0.1 0 - 0.7 K/uL   Basophils Relative 2 %   Basophils Absolute 0.2 (H) 0 - 0.1 K/uL   No results found.  ED Clinical Impression  Urinary tract infection with hematuria, site unspecified  Hematuria, unspecified type   ED Assessment/Plan  Outside records and labs reviewed.  As noted in HPI.  Patient had an E. coli UTI and December 2018.  Resistant to Bactrim, Levaquin, Cipro, sensitive to Macrobid, tetracycline, 2nd and 3rd generation cephalosporins.   Patient is unsure as to where the bleeding is coming from whether this is vaginal or hematuria, she wishes [place] to do a pelvic exam today.  Do not think that we need to send samples for gonorrhea chlamydia or wet prep  since she has not been sexually active since 1995 and she has no other vaginal complaints.  Given that her bleeding seems to improve with antibiotic treatment for UTIs, suspect that UTIs are the source of her bleeding rather than a vaginal source.  I was unable to find any bleeding on physical exam today.  Hemoglobin is normal today.  Her UA is suggestive of UTI with moderate leukocytes, large hematuria and many bacteria.  Will send this off for culture to confirm presence of UTI and antibiotic choice.  Will send her home with Cefpodoxime 200 mg twice daily for 7 days, have her push fluids.  She will need to follow-up with GYN for a better pelvic exam and urology.  Discussed labs, MDM, plan and followup with patient. . patient agrees with plan.   Meds ordered this encounter  Medications  . cefpodoxime (VANTIN) 200 MG tablet    Sig: Take 1 tablet (200 mg total) by mouth 2 (two) times daily for 7 days.    Dispense:  14 tablet    Refill:  0    *This clinic note was created using Lobbyist. Therefore, there may be occasional mistakes despite careful proofreading.   ?   Melynda Ripple, MD 06/22/17 [phone]

## 2017-06-22 NOTE — Discharge Instructions (Signed)
finish the antibiotics unless a healthcare provider tells you to stop.  Follow-up with the urologist and with your primary care physician.  You do need to be seen by GYN to make sure that this is not not vaginal bleeding.  Go immediately to the ER for chest pain, shortness of breath, lightheadedness, dizziness, if you are soaking through more than 1 pad per hour, or other concerns.

## 2017-06-24 ENCOUNTER — Telehealth: Payer: Self-pay | Admitting: Internal Medicine

## 2017-06-24 LAB — URINE CULTURE: Special Requests: NORMAL

## 2017-06-24 NOTE — Telephone Encounter (Signed)
Pt went in to urgent care for infection, was told to see pcp for a referral to obgyn.  Does she need an appt for referral?       cefpodoxime (VANTIN) 200 MG tablet [[id number]]

## 2017-06-24 NOTE — Telephone Encounter (Signed)
Patient should be able to call a GYN office and set up her own appt. No referral needed. Take medication as prescribed.

## 2017-06-28 ENCOUNTER — Ambulatory Visit: Payer: Medicare Other | Admitting: Internal Medicine

## 2017-06-28 ENCOUNTER — Encounter: Payer: Self-pay | Admitting: Internal Medicine

## 2017-06-28 VITALS — BP 130/78 | HR 82 | Ht <= 58 in | Wt 157.4 lb

## 2017-06-28 DIAGNOSIS — M19011 Primary osteoarthritis, right shoulder: Secondary | ICD-10-CM

## 2017-06-28 DIAGNOSIS — M19012 Primary osteoarthritis, left shoulder: Secondary | ICD-10-CM | POA: Diagnosis not present

## 2017-06-28 DIAGNOSIS — R269 Unspecified abnormalities of gait and mobility: Secondary | ICD-10-CM | POA: Diagnosis not present

## 2017-06-28 DIAGNOSIS — I69354 Hemiplegia and hemiparesis following cerebral infarction affecting left non-dominant side: Secondary | ICD-10-CM | POA: Diagnosis not present

## 2017-06-28 NOTE — Progress Notes (Signed)
Date:  06/28/2017   Name:  Sharon York   DOB:  [birth year]   MRN:  [id number]   Chief Complaint: Face to face Pt here for face to face encounter to get a power wheelchair or scooter. She currently has a scooter but it is inoperable. She has had a stroke affecting the left side that limits her ability to perform ADLs. A POV would assist her in toilleting and preparing meals since she lives alone.  Currently, she has trouble getting to the bathroom in time to avoid a toilleting accident and a scooter would reduce these events. She can use a cane but has poor balance.  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 She can not use a manual wheelchair also due to LUE paresis. With a POV, she can transfer quickly and get around the home to perform her ADLs more efficiently and with less physical effort. She is capable of operating a POV, both mentally and physically.  She is very willing and motivated to use the POV.    Review of Systems  Constitutional: Negative for chills, fatigue and fever.  Respiratory: Negative for chest tightness and shortness of breath.   Cardiovascular: Negative for chest pain and palpitations.  Musculoskeletal: Positive for arthralgias and gait problem.  Neurological: Positive for weakness. Negative for dizziness and numbness.  Psychiatric/Behavioral: Negative for confusion, dysphoric mood and sleep disturbance.    Patient Active Problem List   Diagnosis Date Noted  . Hyperlipidemia associated with type 2 diabetes mellitus ([place]) 06/12/2017  . DM (diabetes mellitus), type 2, uncontrolled ([place]) 11/06/2016  . Tinea corporis 03/17/2015  . Nocturnal cough 03/17/2015  . Abnormal gait 12/15/2014  . Altered bowel function 12/15/2014  . Blood pressure elevated without history of HTN 12/15/2014  . Hemiparesis affecting left side as late effect of cerebrovascular accident (CVA) ([employer] [facility]) 12/15/2014  . Absence of bladder continence 12/15/2014  . Obstructive sleep  apnea of adult 12/15/2014  . Arthritis of shoulder region, degenerative 12/15/2014  . OP (osteoporosis) 12/15/2014  . Allergic rhinitis, seasonal 12/15/2014  . Mixed incontinence 03/11/2013    Prior to Admission medications   Medication Sig Start Date End Date Taking? Authorizing Provider  ACCU-CHEK AVIVA PLUS test strip  USE 1 TEST STRIP UP TO 4 TIMES DAILY 05/31/17  Yes [name], MD  ACCU-CHEK FASTCLIX LANCETS MISC 1 each by Does not apply route 2 (two) times daily. 11/26/16  Yes [name], MD  alendronate (FOSAMAX) 70 MG tablet Take 1 tablet (70 mg total) by mouth once a week. Take with a full glass of water on an empty stomach. 02/06/17  Yes [name], MD  aspirin 81 MG chewable tablet Chew 1 tablet by mouth daily.   Yes [provider]  atorvastatin (LIPITOR) 10 MG tablet Take 1 tablet (10 mg total) by mouth daily. 06/12/17  Yes [name], MD  baclofen (LIORESAL) 10 MG tablet Take 1 tablet (10 mg total) by mouth 2 (two) times daily. 02/06/17  Yes [name], MD  blood glucose meter kit and supplies KIT Dispense based on patient and insurance preference. Use up to four times daily as directed. (FOR ICD-9 250.00, 250.01). 04/05/17  Yes [name], MD  Calcium Carbonate-Vitamin D (CALCIUM 500 + D) 500-125 MG-UNIT TABS Take by mouth.   Yes [provider]  cefpodoxime (VANTIN) 200 MG tablet Take 1 tablet (200 mg total) by mouth 2 (two) times daily for 7  days. 06/22/17 06/29/17 Yes Melynda Ripple, MD  fesoterodine (TOVIAZ) 4 MG TB24 tablet Take 1 tablet (4 mg total) by mouth daily. 02/18/17  Yes MacDiarmid, Nicki Reaper, MD  Magnesium 100 MG TABS Take by mouth.   Yes [provider]  metFORMIN (GLUCOPHAGE-XR) 500 MG 24 hr tablet Take 1 tablet (500 mg total) by mouth daily with breakfast. 05/23/17  Yes [name], MD  Multiple Vitamins-Minerals (MULTIVITAMIN ADULT PO) Take by mouth.   Yes [provider]    Allergies    Allergen Reactions  . Nitrofurantoin Nausea Only    Past Surgical History:  Procedure Laterality Date  . CESAREAN SECTION  1975  . COLONOSCOPY  2010   normal    Social History   Tobacco Use  . Smoking status: Never Smoker  . Smokeless tobacco: Never Used  Substance Use Topics  . Alcohol use: No    Alcohol/week: 0.0 oz  . Drug use: No     Medication list has been reviewed and updated.  PHQ 2/9 Scores 01/07/2017 11/22/2016 11/28/2015 01/05/2015  PHQ - 2 Score 0 0 0 0    Physical Exam  Constitutional: She is oriented to person, place, and time. She appears well-developed. No distress.  HENT:  Head: Normocephalic and atraumatic.  Neck: Normal range of motion. Neck supple.  Cardiovascular: Normal rate, regular rhythm and normal heart sounds.  Pulmonary/Chest: Effort normal and breath sounds normal. No respiratory distress. She has no wheezes.  Musculoskeletal:       Right shoulder: She exhibits normal range of motion and no tenderness.       Left shoulder: She exhibits decreased range of motion, tenderness and decreased strength.       Left elbow: She exhibits decreased range of motion.       Right wrist: Normal. She exhibits normal range of motion.  Neurological: She is alert and oriented to person, place, and time. She displays no tremor. No sensory deficit. She exhibits abnormal muscle tone (increased tone in LUE). Gait abnormal.  Reflex Scores:      Bicep reflexes are 2+ on the right side and 0 on the left side.      Patellar reflexes are 2+ on the right side and 0 on the left side. 0/5 strength LUE 3/5 strength LLE 5/5 strength RUE 5/5 strength RLE  Skin: Skin is warm and dry. No rash noted.  Psychiatric: She has a normal mood and affect. Her behavior is normal. Thought content normal.  Nursing note and vitals reviewed.   BP 130/78   Pulse 82   Ht 4' 8.5" (1.435 m)   Wt 157 lb 6.4 oz (71.4 kg)   SpO2 97%   BMI 34.67 kg/m   Assessment and Plan: 1.  Hemiparesis affecting left side as late effect of cerebrovascular accident (CVA) ([place]) Will complete form for POV  2. Primary osteoarthritis of both shoulders stable  3. Abnormal gait Continue Cane until POV available   No orders of the defined types were placed in this encounter.   Partially dictated using Editor, commissioning. Any errors are unintentional.  Halina Maidens, MD [facility]  06/28/2017

## 2017-07-02 DIAGNOSIS — N950 Postmenopausal bleeding: Secondary | ICD-10-CM | POA: Diagnosis not present

## 2017-07-19 ENCOUNTER — Ambulatory Visit
Admission: RE | Admit: 2017-07-19 | Discharge: 2017-07-19 | Disposition: A | Discharge status: Home / Self Care | Payer: Medicare Other | Source: Ambulatory Visit | Attending: Urology | Admitting: Urology

## 2017-07-19 ENCOUNTER — Emergency Department
Admission: EM | Admit: 2017-07-19 | Discharge: 2017-07-19 | Disposition: A | Discharge status: Home / Self Care | Payer: Medicare Other | Attending: Emergency Medicine | Admitting: Emergency Medicine

## 2017-07-19 ENCOUNTER — Other Ambulatory Visit: Payer: Self-pay

## 2017-07-19 ENCOUNTER — Encounter: Payer: Self-pay | Admitting: Emergency Medicine

## 2017-07-19 DIAGNOSIS — E119 Type 2 diabetes mellitus without complications: Secondary | ICD-10-CM | POA: Insufficient documentation

## 2017-07-19 DIAGNOSIS — K439 Ventral hernia without obstruction or gangrene: Secondary | ICD-10-CM | POA: Insufficient documentation

## 2017-07-19 DIAGNOSIS — R195 Other fecal abnormalities: Secondary | ICD-10-CM

## 2017-07-19 DIAGNOSIS — R319 Hematuria, unspecified: Secondary | ICD-10-CM | POA: Diagnosis not present

## 2017-07-19 DIAGNOSIS — R9389 Abnormal findings on diagnostic imaging of other specified body structures: Secondary | ICD-10-CM | POA: Insufficient documentation

## 2017-07-19 DIAGNOSIS — Z79899 Other long term (current) drug therapy: Secondary | ICD-10-CM | POA: Insufficient documentation

## 2017-07-19 DIAGNOSIS — T7840XA Allergy, unspecified, initial encounter: Secondary | ICD-10-CM | POA: Insufficient documentation

## 2017-07-19 DIAGNOSIS — Z7982 Long term (current) use of aspirin: Secondary | ICD-10-CM | POA: Insufficient documentation

## 2017-07-19 DIAGNOSIS — R21 Rash and other nonspecific skin eruption: Secondary | ICD-10-CM | POA: Diagnosis not present

## 2017-07-19 DIAGNOSIS — T508X5A Adverse effect of diagnostic agents, initial encounter: Secondary | ICD-10-CM | POA: Diagnosis not present

## 2017-07-19 DIAGNOSIS — R3129 Other microscopic hematuria: Secondary | ICD-10-CM

## 2017-07-19 DIAGNOSIS — N852 Hypertrophy of uterus: Secondary | ICD-10-CM

## 2017-07-19 LAB — POCT I-STAT CREATININE: Creatinine, Ser: 0.7 mg/dL (ref 0.44–1.00)

## 2017-07-19 MED ORDER — IOPAMIDOL (ISOVUE-300) INJECTION 61%
125.0000 mL | Freq: Once | INTRAVENOUS | Status: AC | PRN
  Administered 2017-07-19: 125 mL via INTRAVENOUS

## 2017-07-19 MED ORDER — HYDROXYZINE HCL 10 MG/5ML PO SYRP: 10.0000 mg | ORAL_SOLUTION | Freq: Three times a day (TID) | ORAL | 0 refills | Status: DC | PRN

## 2017-07-19 MED ORDER — DIPHENHYDRAMINE HCL 50 MG/ML IJ SOLN
12.5000 mg | Freq: Once | INTRAMUSCULAR | Status: AC
  Administered 2017-07-19: 12.5 mg via INTRAVENOUS
  Filled 2017-07-19: qty 1

## 2017-07-19 NOTE — Discharge Instructions (Signed)
Take Atarax elixir as needed for itching and rash.

## 2017-07-19 NOTE — ED Provider Notes (Signed)
[facility] Emergency Department Provider Note   ____________________________________________   None    (approximate)  I have reviewed the triage vital signs and the nursing notes.   HISTORY  Chief Complaint Allergic Reaction    HPI Sharon York is a 72 y.o. female patient sent over from CT scan after developed itching and a facial rash from IV contrast.  Patient denies  respiratory issues.  Patient states that this is the first time having IV contrast.  Past Medical History:  Diagnosis Date  . Diabetes mellitus without complication ([place])   . Osteoporosis   . Overactive bladder   . Stroke [facility])     Patient Active Problem List   Diagnosis Date Noted  . Hyperlipidemia associated with type 2 diabetes mellitus ([place]) 06/12/2017  . DM (diabetes mellitus), type 2, uncontrolled ([place]) 11/06/2016  . Tinea corporis 03/17/2015  . Nocturnal cough 03/17/2015  . Abnormal gait 12/15/2014  . Altered bowel function 12/15/2014  . Blood pressure elevated without history of HTN 12/15/2014  . Hemiparesis affecting left side as late effect of cerebrovascular accident (CVA) ([place]) 12/15/2014  . Absence of bladder continence 12/15/2014  . Obstructive sleep apnea of adult 12/15/2014  . Arthritis of shoulder region, degenerative 12/15/2014  . OP (osteoporosis) 12/15/2014  . Allergic rhinitis, seasonal 12/15/2014  . Mixed incontinence 03/11/2013    Past Surgical History:  Procedure Laterality Date  . CESAREAN SECTION  1975  . COLONOSCOPY  2010   normal    Prior to Admission medications   Medication Sig Start Date End Date Taking? Authorizing Provider  ACCU-CHEK AVIVA PLUS test strip  USE 1 TEST STRIP UP TO 4 TIMES DAILY 05/31/17   [name], MD  ACCU-CHEK FASTCLIX LANCETS MISC 1 each by Does not apply route 2 (two) times daily. 11/26/16   [name], MD  alendronate (FOSAMAX) 70 MG tablet Take 1 tablet (70 mg total) by mouth once a week. Take with a  full glass of water on an empty stomach. 02/06/17   [name], MD  aspirin 81 MG chewable tablet Chew 1 tablet by mouth daily.    [provider]  atorvastatin (LIPITOR) 10 MG tablet Take 1 tablet (10 mg total) by mouth daily. 06/12/17   [name], MD  baclofen (LIORESAL) 10 MG tablet Take 1 tablet (10 mg total) by mouth 2 (two) times daily. 02/06/17   [name], MD  blood glucose meter kit and supplies KIT Dispense based on patient and insurance preference. Use up to four times daily as directed. (FOR ICD-9 250.00, 250.01). 04/05/17   [name], MD  Calcium Carbonate-Vitamin D (CALCIUM 500 + D) 500-125 MG-UNIT TABS Take by mouth.    [provider]  fesoterodine (TOVIAZ) 4 MG TB24 tablet Take 1 tablet (4 mg total) by mouth daily. 02/18/17   Bjorn Loser, MD  hydrOXYzine (ATARAX) 10 MG/5ML syrup Take 5 mLs (10 mg total) by mouth 3 (three) times daily as needed for itching. 07/19/17   Sable Feil, PA-C  Magnesium 100 MG TABS Take by mouth.    [provider]  metFORMIN (GLUCOPHAGE-XR) 500 MG 24 hr tablet Take 1 tablet (500 mg total) by mouth daily with breakfast. 05/23/17   [name], MD  Multiple Vitamins-Minerals (MULTIVITAMIN ADULT PO) Take by mouth.    [provider]    Allergies Nitrofurantoin and Iodinated diagnostic agents  Family History  Problem Relation Age of Onset  .  Diabetes Mother   . Thyroid disease Mother   . Breast cancer Maternal Aunt   . Breast cancer Cousin        mat cousin  . Prostate cancer Brother   . Kidney failure Brother   . Kidney cancer Son   . Bladder Cancer Neg Hx     Social History Social History   Tobacco Use  . Smoking status: Never Smoker  . Smokeless tobacco: Never Used  Substance Use Topics  . Alcohol use: No    Alcohol/week: 0.0 oz  . Drug use: No    Review of Systems Constitutional: No fever/chills Eyes: No visual changes. ENT: No sore  throat. Cardiovascular: Denies chest pain. Respiratory: Denies shortness of breath. Gastrointestinal: No abdominal pain.  No nausea, no vomiting.  No diarrhea.  No constipation. Genitourinary: Negative for dysuria.  OAD. Musculoskeletal: Negative for back pain. Skin: Negative for rash. Neurological: Negative for headaches, focal weakness or numbness. Endocrine:Diabetes and hyperlipidemia. Hematological/Lymphatic: Allergic/Immunilogical: Macrobid and IV contrast dye. ____________________________________________   PHYSICAL EXAM:  VITAL SIGNS: ED Triage Vitals  Enc Vitals Group     BP 07/19/17 0928 140/66     Pulse Rate 07/19/17 0928 70     Resp 07/19/17 0928 18     Temp 07/19/17 0928 98.2 F (36.8 C)     Temp Source 07/19/17 0928 Oral     SpO2 07/19/17 0928 96 %     Weight 07/19/17 0904 157 lb (71.2 kg)     Height 07/19/17 0904 4' 8.5" (1.435 m)     Head Circumference --      Peak Flow --      Pain Score --      Pain Loc --      Pain Edu? --      Excl. in [place]? --    Constitutional: Alert and oriented. Well appearing and in no acute distress. Nose: No congestion/rhinnorhea. Mouth/Throat: Mucous membranes are moist.  Oropharynx non-erythematous.  No signs of angioedema. Neck: No stridor.  Hematological/Lymphatic/Immunilogical: No cervical lymphadenopathy. Cardiovascular: Normal rate, regular rhythm. Grossly normal heart sounds.  Good peripheral circulation. Respiratory: Normal respiratory effort.  No retractions. Lungs CTAB. Neurologic:  Normal speech and language. No gross focal neurologic deficits are appreciated. No gait instability. Skin:  Skin is warm, dry and intact.  Facial macular lesions.   Psychiatric: Mood and affect are normal. Speech and behavior are normal.  ____________________________________________   LABS (all labs ordered are listed, but only abnormal results are displayed)  Labs Reviewed - No data to  display ____________________________________________  EKG   ____________________________________________  RADIOLOGY  ED MD interpretation:    Official radiology report(s): Ct Abdomen Pelvis W Wo Contrast  Result Date: 07/19/2017 CLINICAL DATA:  Microscopic hematuria. EXAM: CT ABDOMEN AND PELVIS WITHOUT AND WITH CONTRAST TECHNIQUE: Multidetector CT imaging of the abdomen and pelvis was performed following the standard protocol before and following the bolus administration of intravenous contrast. CONTRAST:  145m ISOVUE-300 IOPAMIDOL (ISOVUE-300) INJECTION 61% COMPARISON:  None. FINDINGS: Lower chest: The lung bases are grossly clear. No worrisome pulmonary lesions or pleural effusion. The heart is mildly enlarged and there are scattered coronary artery calcifications. The esophagus is grossly normal. Hepatobiliary: No focal hepatic lesions or intrahepatic biliary dilatation. The gallbladder is normal. No common bile duct dilatation. Pancreas: No mass, inflammation or ductal dilatation. Spleen: Normal size.  No focal lesions. Adrenals/Urinary Tract: The adrenal glands and kidneys are unremarkable. No renal, ureteral or bladder calculi or mass. The delayed images  do not demonstrate any significant collecting system abnormalities and both ureters appear normal other than being deviated laterally in the pelvis by the enlarged fibroid uterus. No bladder mass or asymmetric bladder wall thickening. Stomach/Bowel: The stomach, duodenum, small bowel and colon are grossly normal without oral contrast. No inflammatory changes, mass lesions or obstructive findings. The appendix is normal. Moderate stool throughout the colon may suggest constipation. Vascular/Lymphatic: The aorta is normal in caliber. No dissection. Scattered aortic and iliac artery calcifications. The branch vessels are patent. The major venous structures are patent. No mesenteric or retroperitoneal mass or adenopathy. Small scattered lymph nodes  are noted. 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 2 cm submucosal fibroid is noted. The endometrial thickness is top normal for age. Both ovaries appear normal. Other: No free pelvic fluid collections. No inguinal mass or adenopathy. There is a periumbilical abdominal wall hernia containing a small bowel loop but no findings for obstruction or incarceration. Musculoskeletal: Advanced facet disease involving the lower lumbar spine with degenerative anterolisthesis of L4. There is also asymmetric degenerative changes involving the right SI joint with moderate sclerosis. No obvious erosions. IMPRESSION: 1. No CT findings to account for the patient's hematuria. No renal, ureteral or bladder calculi or mass. 2. Markedly enlarged fibroid uterus. 2 cm submucosal fibroid suspected. Pelvic ultrasound may be helpful for further evaluation. The endometrium is upper limits of normal in thickness for age. 3. Moderate stool throughout the colon may suggest constipation. 4. Periumbilical abdominal wall hernia containing a small bowel loop but no evidence of obstruction or perforation. Electronically Signed   By: Marijo Sanes M.D.   On: 07/19/2017 09:22    ____________________________________________   PROCEDURES  Procedure(s) performed: None  Procedures  Critical Care performed: No  ____________________________________________   INITIAL IMPRESSION / ASSESSMENT AND PLAN / ED COURSE  As part of my medical decision making, I reviewed the following data within the [place]   Patient brought over from CT secondary to reaction to IVP contrast dye.  Patient was given 12.5 mg Benadryl IV and reports 30 minutes later if the itching has resolved.  Patient facial rash is also receding.  Patient given discharge care instruction advised to inform her PCP of this reaction.  Take medication as needed for itching.       ____________________________________________   FINAL CLINICAL IMPRESSION(S) / ED DIAGNOSES  Final diagnoses:  Allergic reaction to drug, initial encounter     ED Discharge Orders        Ordered    hydrOXYzine (ATARAX) 10 MG/5ML syrup  3 times daily PRN     07/19/17 1156       Note:  This document was prepared using Dragon voice recognition software and may include unintentional dictation errors.    Sable Feil, PA-C 07/19/17 1159    Schaevitz, Randall An, MD 07/19/17 1435

## 2017-07-19 NOTE — ED Notes (Signed)
First nurse note  Was sen for OP ct scan  After being given IV contrast she developed a hive to face  No resp issues

## 2017-07-19 NOTE — Progress Notes (Signed)
Patient ID: Sharon York, female   DOB: 1945/06/22, 72 y.o.   MRN: [id number] Pt appeared to develop mild hives over lower lip.No breathing difficulty or cp.Pt sent to ER for further evaluation and possible treatment of contrast allergy.Pt instructed that she may be allergic to contrast for future references.

## 2017-07-19 NOTE — ED Triage Notes (Signed)
Brought over from CT via w/c  Pt was seen for op ct scan  After getting some IV contrast she developed hives to face  No resp issues

## 2017-07-22 ENCOUNTER — Encounter: Payer: Self-pay | Admitting: Urology

## 2017-07-22 ENCOUNTER — Other Ambulatory Visit: Payer: Self-pay

## 2017-07-22 ENCOUNTER — Ambulatory Visit: Payer: Medicare Other | Admitting: Urology

## 2017-07-22 VITALS — BP 122/79 | HR 92 | Ht 59.0 in | Wt 154.3 lb

## 2017-07-22 DIAGNOSIS — R3129 Other microscopic hematuria: Secondary | ICD-10-CM | POA: Diagnosis not present

## 2017-07-22 LAB — MICROSCOPIC EXAMINATION: Mucus, UA: NONE SEEN

## 2017-07-22 LAB — URINALYSIS, COMPLETE
Bilirubin, UA: NEGATIVE
Glucose, UA: NEGATIVE
Ketones, UA: NEGATIVE
Nitrite, UA: NEGATIVE
Protein, UA: NEGATIVE
Specific Gravity, UA: 1.015 (ref 1.005–1.030)
Urobilinogen, Ur: 0.2 mg/dL (ref 0.2–1.0)
pH, UA: 7 (ref 5.0–7.5)

## 2017-07-22 MED ORDER — FESOTERODINE FUMARATE ER 4 MG PO TB24: 4.0000 mg | ORAL_TABLET | Freq: Every day | ORAL | 11 refills | Status: DC

## 2017-07-22 MED ORDER — LIDOCAINE HCL 2 % EX GEL
1.0000 "application " | Freq: Once | CUTANEOUS | Status: AC
  Administered 2017-07-22: 1 via URETHRAL

## 2017-07-22 MED ORDER — CIPROFLOXACIN HCL 500 MG PO TABS
500.0000 mg | ORAL_TABLET | Freq: Once | ORAL | Status: AC
  Administered 2017-07-22: 500 mg via ORAL

## 2017-07-22 NOTE — Patient Outreach (Signed)
Attempted to call patient in regards to recent ED visit. Will mail unsuccessful letter, know before you go, and magnet to address listed.

## 2017-07-22 NOTE — Progress Notes (Signed)
07/22/2017 1:33 PM   Sharon York 1945/10/22 [id number]  Referring provider: [name], MD [address] [place], [place] [address]  Chief Complaint  Patient presents with  . Cysto    HPI: Sharon York: Patient states that she has had urinary incontinence since she had her stroke in 1999. She has been on Myrbetriq 50 mg daily for two years. She is not finding it helpful. She has also tried a pessary in the past without reduction in incontinence.  She was noted to have a grade 2 cystocele  Last visit The patient did not wish to have percutaneous tibial nerve stimulation and physical therapy because of cost. She did not want to try anticholinergic therapydue to potential side effects. She still wears one pad per day. Frequency is stable. Sharon York given  The patient is still doing very well on Toviaz with reduced frequency and incontinence.  She noticed some blood on the pad once a few months ago.  She has been documented as having microscopic hematuria.  She has never smoked.  She takes daily aspirin but no other blood thinners  She has not had a kidney stone or previous GU surgery  In the last several months she had one positive urine culture and one negative urine culture and was unable to leave a sample today  A Frequency stable.  Clinically not infected.  Doing very well on Toviaz  After verbal and written consent patient underwent sterile flexible cystoscopy.  Bladder mucosa and trigone were normal.  Ureters were normal.  Urethra was normal.  There is no carcinoma or cystitis  On speculum examination I did not see blood from the uterus loss    PMH: Past Medical History:  Diagnosis Date  . Diabetes mellitus without complication ([place])   . Osteoporosis   . Overactive bladder   . Stroke [facility])     Surgical History: Past Surgical History:  Procedure Laterality Date  . CESAREAN SECTION  1975  . COLONOSCOPY  2010   normal    Home Medications:   Allergies as of 07/22/2017      Reactions   Nitrofurantoin Nausea Only   Iodinated Diagnostic Agents Hives, Cough   Patient developed a hive on her left lip after injection as well as some coughing.       Medication List        Accurate as of 07/22/17  1:33 PM. Always use your most recent med list.          ACCU-CHEK AVIVA PLUS test strip Generic drug:  glucose blood USE 1 TEST STRIP UP TO 4 TIMES DAILY   ACCU-CHEK FASTCLIX LANCETS Misc 1 each by Does not apply route 2 (two) times daily.   alendronate 70 MG tablet Commonly known as:  FOSAMAX Take 1 tablet (70 mg total) by mouth once a week. Take with a full glass of water on an empty stomach.   aspirin 81 MG chewable tablet Chew 1 tablet by mouth daily.   atorvastatin 10 MG tablet Commonly known as:  LIPITOR Take 1 tablet (10 mg total) by mouth daily.   baclofen 10 MG tablet Commonly known as:  LIORESAL Take 1 tablet (10 mg total) by mouth 2 (two) times daily.   blood glucose meter kit and supplies Kit Dispense based on patient and insurance preference. Use up to four times daily as directed. (FOR ICD-9 250.00, 250.01).   CALCIUM 500 + D 500-125 MG-UNIT Tabs Generic drug:  Calcium Carbonate-Vitamin D Take by  mouth.   fesoterodine 4 MG Tb24 tablet Commonly known as:  TOVIAZ Take 1 tablet (4 mg total) by mouth daily.   hydrOXYzine 10 MG/5ML syrup Commonly known as:  ATARAX Take 5 mLs (10 mg total) by mouth 3 (three) times daily as needed for itching.   Magnesium 100 MG Tabs Take by mouth.   metFORMIN 500 MG 24 hr tablet Commonly known as:  GLUCOPHAGE-XR Take 1 tablet (500 mg total) by mouth daily with breakfast.   MULTIVITAMIN ADULT PO Take by mouth.       Allergies:  Allergies  Allergen Reactions  . Nitrofurantoin Nausea Only  . Iodinated Diagnostic Agents Hives and Cough    Patient developed a hive on her left lip after injection as well as some coughing.     Family History: Family History    Problem Relation Age of Onset  . Diabetes Mother   . Thyroid disease Mother   . Breast cancer Maternal Aunt   . Breast cancer Cousin        mat cousin  . Prostate cancer Brother   . Kidney failure Brother   . Kidney cancer Son   . Bladder Cancer Neg Hx     Social History:  reports that  has never smoked. she has never used smokeless tobacco. She reports that she does not drink alcohol or use drugs.  ROS:                                        Physical Exam: BP 122/79 (BP Location: Right Arm, Patient Position: Sitting, Cuff Size: Large)   Pulse 92   Ht '4\' 11"'  (1.499 m)   Wt 70 kg (154 lb 4.8 oz)   BMI 31.16 kg/m   Constitutional:  Alert and oriented, No acute distress.   Laboratory Data: Lab Results  Component Value Date   WBC 9.6 06/22/2017   HGB 12.6 06/22/2017   HCT 38.1 06/22/2017   MCV 71.5 (L) 06/22/2017   PLT 234 06/22/2017    Lab Results  Component Value Date   CREATININE 0.70 07/19/2017    No results found for: PSA  No results found for: TESTOSTERONE  Lab Results  Component Value Date   HGBA1C 6.3 (H) 06/11/2017    Urinalysis    Component Value Date/Time   COLORURINE STRAW (A) 06/22/2017 1222   APPEARANCEUR CLOUDY (A) 06/22/2017 1222   APPEARANCEUR Clear 02/18/2017 1204   LABSPEC 1.015 06/22/2017 1222   PHURINE >9.0 (H) 06/22/2017 1222   GLUCOSEU NEGATIVE 06/22/2017 1222   HGBUR LARGE (A) 06/22/2017 1222   BILIRUBINUR NEGATIVE 06/22/2017 1222   BILIRUBINUR neg 05/23/2017 0926   BILIRUBINUR Negative 02/18/2017 [place] 06/22/2017 1222   PROTEINUR 30 (A) 06/22/2017 1222   UROBILINOGEN 0.2 05/23/2017 0926   NITRITE NEGATIVE 06/22/2017 1222   LEUKOCYTESUR MODERATE (A) 06/22/2017 1222   LEUKOCYTESUR Negative 02/18/2017 1204    Pertinent Imaging:   Assessment & Plan: The patient has been cleared for blood in the urine.  We will renew her Sharon York and see her in a year.  She did have a large uterine  fibroid and is to see her gynecologist and has talked about a possible biopsy.  The blood may have been from the uterus in retrospect.  1. Microscopic hematuria  - Urinalysis, Complete - ciprofloxacin (CIPRO) tablet 500 mg - lidocaine (XYLOCAINE) 2 % jelly 1  application   No Follow-up on file.  Reece Packer, MD  [facility] [address] [place], [place] [address] [phone]

## 2017-07-23 DIAGNOSIS — N950 Postmenopausal bleeding: Secondary | ICD-10-CM | POA: Diagnosis not present

## 2017-07-24 DIAGNOSIS — L03031 Cellulitis of right toe: Secondary | ICD-10-CM | POA: Diagnosis not present

## 2017-08-05 ENCOUNTER — Other Ambulatory Visit: Payer: Self-pay | Admitting: *Deleted

## 2017-08-05 NOTE — Patient Outreach (Signed)
[facility] Care Management  08/05/2017  Sharon York 1945/10/02 [id number]  Self referral; patient has diabetes and recent toe surgery 07/23/2017; has [employer] Medicare insurance:  Telephone call to patient who was advised of [facility] care [facility]. HIPPA verification received. Patient agrees to screening assessment.  States she lives [place] in [place], [place]. States she currently drives her self to MD appointments. States she has follow up appointment with foot MD 3/20 and daughter will be taking her to follow up appointment. States she had ingrown toenail removed. Patient advised she has no signs of infection such as drainage of pus, severe pain, swelling or redness. States she has not had chills or fever.  States she will advise MD if any abnormal signs such as mentioned above.  Voices she manages her own medications and gets prescriptions filled at pharmacy. Voices she takes medications as prescribed by her MD.  States she recently ordered treatment for hair loss which includes pills. States she plans to take with pharmacist if okay to take with her current medications. Advised that she should also discuss issue with her primary care provider and advise of treatment that she is considering. Patient voices understanding & states will discuss with her MD.   Patient voices that she has diabetes and is on oral medication.. States she checks blood sugar twice daily & records. States she was advised at last MD check up that blood sugar were fine.  Patient was unable to advise what levels were high or low. Did not have working knowledge of indications of "A1c" level. Voices she has never had any diabetes classes.  Patient advised of Health Coach services regarding diabetes management. States she is interested in services.  Plan: Refer to care management assistant to assign to RN Health Coach for disease management for Diabetes Mellitus.   Colleen CanLinda Tynisha Ogan, RN BSN  CCM Care Management Coordinator [facility] Care [facility]  [phone]

## 2017-08-05 NOTE — Addendum Note (Signed)
Addended by: Kathrin PennerMANNING, Heath Badon W on: 08/05/2017 04:54 PM   Modules accepted: Orders

## 2017-08-06 ENCOUNTER — Encounter: Payer: Self-pay | Admitting: *Deleted

## 2017-08-07 DIAGNOSIS — L03031 Cellulitis of right toe: Secondary | ICD-10-CM | POA: Diagnosis not present

## 2017-08-15 ENCOUNTER — Encounter: Payer: Self-pay | Admitting: *Deleted

## 2017-08-15 ENCOUNTER — Other Ambulatory Visit: Payer: Self-pay | Admitting: *Deleted

## 2017-08-15 NOTE — Patient Outreach (Signed)
[facility]) Care Management  08/15/2017   Sharon York 10/14/1945 [id number]  RN Health Coach telephone call to patient.  [place] compliance verified. Per patient her fasting blood sugar was 120. Patient stated she checks her blood sugar 1-2 times a day. Patient did not know the signs and symptoms of hyper and hypoglycemia. Patient is trying to eat healthy. She is monitoring her diet closely. Patient went to nutritional classes. Patient is trying to start a routine exercise plan. She foes ti the sports flex once e a week but is trying to get in the ne millenium program. Patient does go to a podiatrist and went recently for an ingrown toenail. Patient is checking her feet daily. No tenderness or redness around the toe. Patient wears a hearing aid in rt ear. 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 Patient has agreed to further outreach calls.    Current Medications:  Current Outpatient Medications  Medication Sig Dispense Refill  . ACCU-CHEK AVIVA PLUS test strip  USE 1 TEST STRIP UP TO 4 TIMES DAILY 400 each 3  . ACCU-CHEK FASTCLIX LANCETS MISC 1 each by Does not apply route 2 (two) times daily. 100 each 3  . alendronate (FOSAMAX) 70 MG tablet Take 1 tablet (70 mg total) by mouth once a week. Take with a full glass of water on an empty stomach. 4 tablet 12  . aspirin 81 MG chewable tablet Chew 1 tablet by mouth daily.    Marland Kitchen atorvastatin (LIPITOR) 10 MG tablet Take 1 tablet (10 mg total) by mouth daily. 90 tablet 3  . baclofen (LIORESAL) 10 MG tablet Take 1 tablet (10 mg total) by mouth 2 (two) times daily. 60 tablet 12  . blood glucose meter kit and supplies KIT Dispense based on patient and insurance preference. Use up to four times daily as directed. (FOR ICD-9 250.00, 250.01). 1 each 0  . Calcium Carbonate-Vitamin D (CALCIUM 500 + D) 500-125 MG-UNIT TABS Take by mouth.    . fesoterodine (TOVIAZ) 4 MG TB24 tablet Take 1 tablet (4 mg total) by mouth daily. 30 tablet 11  .  hydrOXYzine (ATARAX) 10 MG/5ML syrup Take 5 mLs (10 mg total) by mouth 3 (three) times daily as needed for itching. 120 mL 0  . Magnesium 100 MG TABS Take by mouth.    . metFORMIN (GLUCOPHAGE-XR) 500 MG 24 hr tablet Take 1 tablet (500 mg total) by mouth daily with breakfast. 90 tablet 1  . Multiple Vitamins-Minerals (MULTIVITAMIN ADULT PO) Take by mouth.     No current facility-administered medications for this visit.     Functional Status:  In your present state of health, do you have any difficulty performing the following activities: 08/15/2017 01/07/2017  Hearing? Tempie Donning  Vision? N N  Difficulty concentrating or making decisions? N N  Walking or climbing stairs? Y Y  Dressing or bathing? N N  Doing errands, shopping? Y N  Preparing Food and eating ? N N  Using the Toilet? N N  In the past six months, have you accidently leaked urine? Y Y  Comment - depends  Do you have problems with loss of bowel control? N N  Managing your Medications? N N  Managing your Finances? N N  Housekeeping or managing your Housekeeping? Y N  Some recent data might be hidden    Fall/Depression Screening: Fall Risk  08/15/2017 08/05/2017 01/07/2017  Falls in the past year? No No Yes  Comment - - -  Number falls  in past yr: - - 1  Injury with Fall? - - Yes  Risk Factor Category  - - -  Risk for fall due to : Impaired balance/gait;Impaired mobility Impaired mobility -  Risk for fall due to: Comment - - -  Follow up - - Falls prevention discussed   PHQ 2/9 Scores 08/15/2017 08/05/2017 01/07/2017 11/22/2016 11/28/2015 01/05/2015  PHQ - 2 Score 0 0 0 0 0 0   THN CM Care Plan Problem One     Most Recent Value  Care Plan Problem One  Knowledge Deficit in Self Management of Diabetes  Role Documenting the Problem One  [place] for Problem One  Active  THN Long Term Goal   Patient will have a better understanding of what A1C is within the next 90 days  [facility] Long Term Goal Start Date  08/15/17   Interventions for Problem One Long Term Goal  RN discussed what the A1C is. RN sent EMMI educational material on A1C. RN explained What the A1C and Fasting blood sugar and A1C means. RN will follow up with further outreach and teachback  [facility] CM Short Term Goal #1   Patient will be able to verbalize the signs and symptoms of hypo and hyperglycemia within the next 30 days  [facility] CM Short Term Goal #1 Start Date  08/15/17  Interventions for Short Term Goal #1  RN discussed the patient signs and symptoms of hypo and hyperglycemia. RN sent a facial chart of hypo and hyperglycemia. RN sent EMMI education on hypo and hyperglycemia symptoms. RN will follow up with further discussion and teach back  [facility]N CM Short Term Goal #2   Patient will report checking and documenting blood sugars within the next 30 days  [facility]N CM Short Term Goal #2 Start Date  08/15/17  Interventions for Short Term Goal #2  RN discussed check glucose and documenting. RN sent  2019 calendar book for patient to keep record. RN will follow up with further discussion and teach back     Assessment:  A1C 6.3 Fasting blood sugar 120 Patient does not know the signs and symptoms of hyper and hypoglycemia Patient does not know what A1C means Patient will benefit from [place] telephonic outreach for education and support for diabetes self management.  Plan:  RN sent 2019 Calendar Book  RN sent Living well with diabetes book RN sent Facial picture of hypo and hyperglycemia RN sent EMMI education on hyper and hypoglycemia RN sent EMMI education on Why get your A1C checked RN will follow up within the month of April  Dwane Andres [place] Care Management [phone]

## 2017-08-28 ENCOUNTER — Telehealth: Payer: Self-pay

## 2017-08-28 NOTE — Telephone Encounter (Signed)
Called pt to reschedule 01/09/18 AWV. Program no longer available on Thursdays. Unable to reach d/t no answer or VM.

## 2017-08-30 NOTE — Telephone Encounter (Signed)
Called pt to reschedule 01/09/18 AWV. Program no longer available on Thursdays. Unable to reach d/t no answer or VM on home #. Was able to LVM on mobile # requesting returned call.

## 2017-09-02 NOTE — Telephone Encounter (Signed)
Called pt to reschedule 01/09/18 AWV. Program no longer available on Thursdays. Unable to reach d/t no answer or VM on home #. Was able to LVM on mobile # requesting returned call. 

## 2017-09-12 ENCOUNTER — Encounter: Payer: Self-pay | Admitting: Dietician

## 2017-09-12 NOTE — Progress Notes (Signed)
Patient report for 3670-month follow-up indicates she is doing well, positive support from others, and successful achievement of her goals.

## 2017-09-13 ENCOUNTER — Other Ambulatory Visit: Payer: Self-pay | Admitting: *Deleted

## 2017-09-13 NOTE — Patient Outreach (Signed)
[facility]) Care Management  09/13/2017  Denver FasterJoanna York 01/04/1946 [id number]  RN Health Coach attempted#[phone] follow up outreach call to patient.  Patient was unavailable. Phone line busy. Plan: RN will call patient again within 10 business days.  Gean MaidensFrances Dahna Hattabaugh BSN RN [facility] Care Management [phone]

## 2017-10-01 ENCOUNTER — Ambulatory Visit (INDEPENDENT_AMBULATORY_CARE_PROVIDER_SITE_OTHER): Payer: Medicare Other | Admitting: Internal Medicine

## 2017-10-01 ENCOUNTER — Encounter: Payer: Self-pay | Admitting: Internal Medicine

## 2017-10-01 VITALS — BP 108/68 | HR 72 | Resp 16 | Ht 59.0 in | Wt 154.0 lb

## 2017-10-01 DIAGNOSIS — E1169 Type 2 diabetes mellitus with other specified complication: Secondary | ICD-10-CM

## 2017-10-01 DIAGNOSIS — R350 Frequency of micturition: Secondary | ICD-10-CM

## 2017-10-01 DIAGNOSIS — N3000 Acute cystitis without hematuria: Secondary | ICD-10-CM | POA: Diagnosis not present

## 2017-10-01 DIAGNOSIS — E785 Hyperlipidemia, unspecified: Secondary | ICD-10-CM

## 2017-10-01 LAB — POC URINALYSIS WITH MICROSCOPIC (NON AUTO)MANUAL RESULT
Bilirubin, UA: NEGATIVE
Crystals: 0
Epithelial cells, urine per micros: 2
Glucose, UA: NEGATIVE
Ketones, UA: NEGATIVE
Mucus, UA: 0
Nitrite, UA: POSITIVE
Protein, UA: NEGATIVE
RBC: 0 M/uL — AB (ref 4.04–5.48)
Spec Grav, UA: <=1.005 — AB (ref 1.010–1.025)
Urobilinogen, UA: 0.2 E.U./dL
WBC Casts, UA: 5
pH, UA: 6.5 (ref 5.0–8.0)

## 2017-10-01 MED ORDER — CEPHALEXIN 500 MG PO CAPS: 500.0000 mg | ORAL_CAPSULE | Freq: Four times a day (QID) | ORAL | 0 refills | Status: AC

## 2017-10-01 NOTE — Progress Notes (Signed)
Date:  10/01/2017   Name:  Sharon York   DOB:  [birth year]   MRN:  [id number]   Chief Complaint: Urinary Frequency (1 week of urine frequency without dysuria. Sleep being interrupted also. )  Urinary Tract Infection   This is a new problem. The current episode started in the past 7 days. The problem has been unchanged. The patient is experiencing no pain. There has been no fever. Associated symptoms include frequency. Pertinent negatives include no chills, hematuria or sweats. She has tried increased fluids for the symptoms.   She recently started lipitor and is doing well.  No medication side effects are noted.  Her blood sugars remain stable around 140 fasting.  She has CPX and labs in three months.  Review of Systems  Constitutional: Negative for chills and fatigue.  Respiratory: Negative for chest tightness and shortness of breath.   Cardiovascular: Negative for chest pain.  Gastrointestinal: Negative for abdominal pain.  Genitourinary: Positive for frequency. Negative for difficulty urinating, hematuria and pelvic pain.    Patient Active Problem List   Diagnosis Date Noted  . Hyperlipidemia associated with type 2 diabetes mellitus ([place]) 06/12/2017  . DM (diabetes mellitus), type 2, uncontrolled ([place]) 11/06/2016  . Tinea corporis 03/17/2015  . Nocturnal cough 03/17/2015  . Abnormal gait 12/15/2014  . Altered bowel function 12/15/2014  . Blood pressure elevated without history of HTN 12/15/2014  . Hemiparesis affecting left side as late effect of cerebrovascular accident (CVA) ([place]) 12/15/2014  . Absence of bladder continence 12/15/2014  . Obstructive sleep apnea of adult 12/15/2014  . Arthritis of shoulder region, degenerative 12/15/2014  . OP (osteoporosis) 12/15/2014  . Allergic rhinitis, seasonal 12/15/2014  . Mixed incontinence 03/11/2013    Prior to Admission medications   Medication Sig Start Date End Date Taking? Authorizing Provider  ACCU-CHEK AVIVA PLUS test  strip  USE 1 TEST STRIP UP TO 4 TIMES DAILY 05/31/17  Yes [name], MD  ACCU-CHEK FASTCLIX LANCETS MISC 1 each by Does not apply route 2 (two) times daily. 11/26/16  Yes [name], MD  alendronate (FOSAMAX) 70 MG tablet Take 1 tablet (70 mg total) by mouth once a week. Take with a full glass of water on an empty stomach. 02/06/17  Yes [name], MD  aspirin 81 MG chewable tablet Chew 1 tablet by mouth daily.   Yes [provider]  atorvastatin (LIPITOR) 10 MG tablet Take 1 tablet (10 mg total) by mouth daily. 06/12/17  Yes [name], MD  baclofen (LIORESAL) 10 MG tablet Take 1 tablet (10 mg total) by mouth 2 (two) times daily. 02/06/17  Yes [name], MD  blood glucose meter kit and supplies KIT Dispense based on patient and insurance preference. Use up to four times daily as directed. (FOR ICD-9 250.00, 250.01). 04/05/17  Yes [name], MD  Calcium Carbonate-Vitamin D (CALCIUM 500 + D) 500-125 MG-UNIT TABS Take by mouth.   Yes [provider]  fesoterodine (TOVIAZ) 4 MG TB24 tablet Take 1 tablet (4 mg total) by mouth daily. 07/22/17  Yes MacDiarmid, Nicki Reaper, MD  metFORMIN (GLUCOPHAGE-XR) 500 MG 24 hr tablet Take 1 tablet (500 mg total) by mouth daily with breakfast. 05/23/17  Yes [name], MD  Multiple Vitamins-Minerals (MULTIVITAMIN ADULT PO) Take by mouth.   Yes [provider]    Allergies  Allergen Reactions  . Nitrofurantoin Nausea Only  . Iodinated Diagnostic Agents Hives and Cough  Patient developed a hive on her left lip after injection as well as some coughing.     Past Surgical History:  Procedure Laterality Date  . CESAREAN SECTION  1975  . COLONOSCOPY  2010   normal    Social History   Tobacco Use  . Smoking status: Never Smoker  . Smokeless tobacco: Never Used  Substance Use Topics  . Alcohol use: No    Alcohol/week: 0.0 oz  . Drug use: No     Medication list has been reviewed and  updated.  PHQ 2/9 Scores 08/15/2017 08/05/2017 01/07/2017 11/22/2016  PHQ - 2 Score 0 0 0 0    Physical Exam  Constitutional: She appears well-developed and well-nourished.  Cardiovascular: Normal rate, regular rhythm and normal heart sounds.  Pulmonary/Chest: Effort normal and breath sounds normal. No respiratory distress.  Abdominal: Soft. Bowel sounds are normal. There is tenderness in the suprapubic area. There is no rebound, no guarding and no CVA tenderness.  Psychiatric: She has a normal mood and affect.  Nursing note and vitals reviewed.   BP 108/68   Pulse 72   Resp 16   Ht '4\' 11"'  (1.499 m)   Wt 154 lb (69.9 kg)   BMI 31.10 kg/m   Assessment and Plan: 1. Urinary frequency Continue fluids - POC urinalysis w microscopic (non auto)  2. Acute cystitis without hematuria - cephALEXin (KEFLEX) 500 MG capsule; Take 1 capsule (500 mg total) by mouth 4 (four) times daily for 10 days.  Dispense: 40 capsule; Refill: 0   Meds ordered this encounter  Medications  . cephALEXin (KEFLEX) 500 MG capsule    Sig: Take 1 capsule (500 mg total) by mouth 4 (four) times daily for 10 days.    Dispense:  40 capsule    Refill:  0    Partially dictated using Editor, commissioning. Any errors are unintentional.  Halina Maidens, MD [facility]  10/01/2017

## 2017-10-30 ENCOUNTER — Other Ambulatory Visit: Payer: Self-pay | Admitting: *Deleted

## 2017-10-30 NOTE — Patient Outreach (Addendum)
[facility]) Care Management  10/30/2017  Sharon FasterJoanna York [birth year] [id number]   RN Health Coach attempted #[phone] up outreach call to patient.  Patient was unavailable.No  voicemail available. No message left .  Plan: RN will call patient again within 10 days. Unsuccessful outreach letter sent [name] BSN RN [facility] Care Management [phone]

## 2017-11-01 ENCOUNTER — Encounter: Payer: Self-pay | Admitting: Internal Medicine

## 2017-11-01 ENCOUNTER — Ambulatory Visit (INDEPENDENT_AMBULATORY_CARE_PROVIDER_SITE_OTHER): Payer: Medicare Other | Admitting: Internal Medicine

## 2017-11-01 VITALS — BP 118/79 | HR 88 | Temp 98.0°F | Resp 16 | Ht 59.0 in | Wt 159.0 lb

## 2017-11-01 DIAGNOSIS — R0981 Nasal congestion: Secondary | ICD-10-CM | POA: Diagnosis not present

## 2017-11-01 DIAGNOSIS — Z1211 Encounter for screening for malignant neoplasm of colon: Secondary | ICD-10-CM | POA: Diagnosis not present

## 2017-11-01 DIAGNOSIS — N950 Postmenopausal bleeding: Secondary | ICD-10-CM | POA: Insufficient documentation

## 2017-11-01 HISTORY — DX: Postmenopausal bleeding: N95.0

## 2017-11-01 MED ORDER — BENZONATATE 100 MG PO CAPS: 100.0000 mg | ORAL_CAPSULE | Freq: Three times a day (TID) | ORAL | 0 refills | Status: DC

## 2017-11-01 NOTE — Progress Notes (Signed)
Date:  11/01/2017   Name:  Sharon York   DOB:  [birth year]   MRN:  [id number]   Chief Complaint: Cough (1 week ) Cough  This is a new problem. The current episode started in the past 7 days. The problem occurs hourly. The cough is non-productive. Associated symptoms include nasal congestion, postnasal drip, a sore throat and wheezing. Pertinent negatives include no chest pain, chills, ear pain, fever or shortness of breath. She has tried nothing for the symptoms.   HM - due for colon cancer screening.  Colonoscopy last done in 2009.  Prep difficult due to hemiparesis from old stroke.    Review of Systems  Constitutional: Negative for chills, fatigue and fever.  HENT: Positive for postnasal drip and sore throat. Negative for ear pain, trouble swallowing and voice change.   Eyes: Negative for visual disturbance.  Respiratory: Positive for cough and wheezing. Negative for chest tightness and shortness of breath.   Cardiovascular: Negative for chest pain and palpitations.  Gastrointestinal: Negative for blood in stool, constipation and diarrhea.    Patient Active Problem List   Diagnosis Date Noted  . PMB (postmenopausal bleeding) 11/01/2017  . Hyperlipidemia associated with type 2 diabetes mellitus ([place]) 06/12/2017  . DM (diabetes mellitus), type 2, uncontrolled ([place]) 11/06/2016  . Tinea corporis 03/17/2015  . Nocturnal cough 03/17/2015  . Abnormal gait 12/15/2014  . Altered bowel function 12/15/2014  . Blood pressure elevated without history of HTN 12/15/2014  . Hemiparesis affecting left side as late effect of cerebrovascular accident (CVA) ([place]) 12/15/2014  . Absence of bladder continence 12/15/2014  . Obstructive sleep apnea of adult 12/15/2014  . Arthritis of shoulder region, degenerative 12/15/2014  . OP (osteoporosis) 12/15/2014  . Allergic rhinitis, seasonal 12/15/2014  . Mixed incontinence 03/11/2013    Prior to Admission medications   Medication Sig Start Date End  Date Taking? Authorizing Provider  ACCU-CHEK AVIVA PLUS test strip  USE 1 TEST STRIP UP TO 4 TIMES DAILY 05/31/17  Yes [name], MD  ACCU-CHEK FASTCLIX LANCETS MISC 1 each by Does not apply route 2 (two) times daily. 11/26/16  Yes [name], MD  alendronate (FOSAMAX) 70 MG tablet Take 1 tablet (70 mg total) by mouth once a week. Take with a full glass of water on an empty stomach. 02/06/17  Yes [name], MD  aspirin 81 MG chewable tablet Chew 1 tablet by mouth daily.   Yes [provider]  atorvastatin (LIPITOR) 10 MG tablet Take 1 tablet (10 mg total) by mouth daily. 06/12/17  Yes [name], MD  baclofen (LIORESAL) 10 MG tablet Take 1 tablet (10 mg total) by mouth 2 (two) times daily. 02/06/17  Yes [name], MD  blood glucose meter kit and supplies KIT Dispense based on patient and insurance preference. Use up to four times daily as directed. (FOR ICD-9 250.00, 250.01). 04/05/17  Yes [name], MD  Calcium Carbonate-Vitamin D (CALCIUM 500 + D) 500-125 MG-UNIT TABS Take by mouth.   Yes [provider]  fesoterodine (TOVIAZ) 4 MG TB24 tablet Take 1 tablet (4 mg total) by mouth daily. 07/22/17  Yes MacDiarmid, Nicki Reaper, MD  metFORMIN (GLUCOPHAGE-XR) 500 MG 24 hr tablet Take 1 tablet (500 mg total) by mouth daily with breakfast. 05/23/17  Yes [name], MD  Multiple Vitamins-Minerals (MULTIVITAMIN ADULT PO) Take by mouth.   Yes [provider]    Allergies  Allergen Reactions  . Nitrofurantoin Nausea  Only  . Iodinated Diagnostic Agents Hives and Cough    Patient developed a hive on her left lip after injection as well as some coughing.     Past Surgical History:  Procedure Laterality Date  . CESAREAN SECTION  1975  . COLONOSCOPY  2010   normal    Social History   Tobacco Use  . Smoking status: Never Smoker  . Smokeless tobacco: Never Used  Substance Use Topics  . Alcohol use: No    Alcohol/week: 0.0 oz  . Drug  use: No     Medication list has been reviewed and updated.  Current Meds  Medication Sig  . ACCU-CHEK AVIVA PLUS test strip  USE 1 TEST STRIP UP TO 4 TIMES DAILY  . ACCU-CHEK FASTCLIX LANCETS MISC 1 each by Does not apply route 2 (two) times daily.  Marland Kitchen alendronate (FOSAMAX) 70 MG tablet Take 1 tablet (70 mg total) by mouth once a week. Take with a full glass of water on an empty stomach.  Marland Kitchen aspirin 81 MG chewable tablet Chew 1 tablet by mouth daily.  Marland Kitchen atorvastatin (LIPITOR) 10 MG tablet Take 1 tablet (10 mg total) by mouth daily.  . baclofen (LIORESAL) 10 MG tablet Take 1 tablet (10 mg total) by mouth 2 (two) times daily.  . blood glucose meter kit and supplies KIT Dispense based on patient and insurance preference. Use up to four times daily as directed. (FOR ICD-9 250.00, 250.01).  . Calcium Carbonate-Vitamin D (CALCIUM 500 + D) 500-125 MG-UNIT TABS Take by mouth.  . fesoterodine (TOVIAZ) 4 MG TB24 tablet Take 1 tablet (4 mg total) by mouth daily.  . metFORMIN (GLUCOPHAGE-XR) 500 MG 24 hr tablet Take 1 tablet (500 mg total) by mouth daily with breakfast.  . Multiple Vitamins-Minerals (MULTIVITAMIN ADULT PO) Take by mouth.    PHQ 2/9 Scores 11/01/2017 08/15/2017 08/05/2017 01/07/2017  PHQ - 2 Score 0 0 0 0    Physical Exam  Constitutional: She is oriented to person, place, and time. She appears well-developed. No distress.  HENT:  Head: Normocephalic and atraumatic.  Right Ear: Tympanic membrane and ear canal normal. Decreased hearing is noted.  Nose: Right sinus exhibits no maxillary sinus tenderness and no frontal sinus tenderness. Left sinus exhibits no maxillary sinus tenderness and no frontal sinus tenderness.  Mouth/Throat: No posterior oropharyngeal edema or posterior oropharyngeal erythema.  Left canal obscured by cerumen  Neck: Normal range of motion. Neck supple.  Pulmonary/Chest: Effort normal and breath sounds normal. No respiratory distress. She has no wheezes. She  exhibits no tenderness.  Musculoskeletal: Normal range of motion.  Neurological: She is alert and oriented to person, place, and time.  Skin: Skin is warm and dry. No rash noted.  Psychiatric: She has a normal mood and affect. Her behavior is normal. Thought content normal.  Nursing note and vitals reviewed.   BP 118/79   Pulse 88   Temp 98 F (36.7 C)   Resp 16   Ht 4' 11" (1.499 m)   Wt 159 lb (72.1 kg)   SpO2 97%   BMI 32.11 kg/m   Assessment and Plan: 1. Sinus congestion Recommend claritin or allegra No indication for antibiotics - benzonatate (TESSALON) 100 MG capsule; Take 1 capsule (100 mg total) by mouth 3 (three) times daily.  Dispense: 30 capsule; Refill: 0  2. Colon cancer screening - Fecal occult blood, imunochemical   Meds ordered this encounter  Medications  . benzonatate (TESSALON) 100 MG capsule  Sig: Take 1 capsule (100 mg total) by mouth 3 (three) times daily.    Dispense:  30 capsule    Refill:  0    Partially dictated using Editor, commissioning. Any errors are unintentional.  Halina Maidens, MD [facility]  11/01/2017

## 2017-11-01 NOTE — Patient Instructions (Signed)
Claritin 10 mg or Allegra 180 mg - either one once a day for 10 days.

## 2017-11-06 ENCOUNTER — Ambulatory Visit: Payer: Self-pay | Admitting: *Deleted

## 2017-11-11 DIAGNOSIS — Z1211 Encounter for screening for malignant neoplasm of colon: Secondary | ICD-10-CM | POA: Diagnosis not present

## 2017-11-12 LAB — FECAL OCCULT BLOOD, IMMUNOCHEMICAL: Fecal Occult Bld: NEGATIVE

## 2017-11-12 LAB — SPECIMEN STATUS REPORT

## 2017-11-18 ENCOUNTER — Telehealth: Payer: Self-pay

## 2017-11-18 NOTE — Telephone Encounter (Signed)
Patient has a colonoscopy procedure coming up 12/30/2017 with [facility]

## 2017-11-18 NOTE — Telephone Encounter (Signed)
What is the question? 

## 2017-11-18 NOTE — Telephone Encounter (Signed)
No question...just FYI!

## 2017-12-02 ENCOUNTER — Other Ambulatory Visit: Payer: Self-pay | Admitting: Internal Medicine

## 2017-12-02 DIAGNOSIS — Z1231 Encounter for screening mammogram for malignant neoplasm of breast: Secondary | ICD-10-CM

## 2017-12-11 ENCOUNTER — Ambulatory Visit
Admission: RE | Admit: 2017-12-11 | Discharge: 2017-12-11 | Disposition: A | Discharge status: Home / Self Care | Payer: Medicare Other | Source: Ambulatory Visit | Attending: Internal Medicine | Admitting: Internal Medicine

## 2017-12-11 DIAGNOSIS — Z1231 Encounter for screening mammogram for malignant neoplasm of breast: Secondary | ICD-10-CM | POA: Diagnosis not present

## 2018-01-06 ENCOUNTER — Ambulatory Visit (INDEPENDENT_AMBULATORY_CARE_PROVIDER_SITE_OTHER): Payer: Medicare Other | Admitting: Internal Medicine

## 2018-01-06 ENCOUNTER — Encounter: Payer: Self-pay | Admitting: Internal Medicine

## 2018-01-06 VITALS — BP 108/68 | HR 94 | Ht 59.0 in | Wt 159.0 lb

## 2018-01-06 DIAGNOSIS — S40022A Contusion of left upper arm, initial encounter: Secondary | ICD-10-CM

## 2018-01-06 DIAGNOSIS — N3000 Acute cystitis without hematuria: Secondary | ICD-10-CM

## 2018-01-06 DIAGNOSIS — N3946 Mixed incontinence: Secondary | ICD-10-CM | POA: Diagnosis not present

## 2018-01-06 LAB — POC URINALYSIS WITH MICROSCOPIC (NON AUTO)MANUAL RESULT
Bilirubin, UA: NEGATIVE
Blood, UA: NEGATIVE
Crystals: 0
Epithelial cells, urine per micros: 0
Glucose, UA: NEGATIVE
Ketones, UA: NEGATIVE
Mucus, UA: 0
Nitrite, UA: POSITIVE
Protein, UA: NEGATIVE
RBC: 5 M/uL (ref 4.04–5.48)
Spec Grav, UA: 1.015 (ref 1.010–1.025)
Urobilinogen, UA: 0.2 E.U./dL
WBC Casts, UA: 5
pH, UA: 6 (ref 5.0–8.0)

## 2018-01-06 MED ORDER — CEPHALEXIN 500 MG PO CAPS: 500.0000 mg | ORAL_CAPSULE | Freq: Four times a day (QID) | ORAL | 0 refills | Status: AC

## 2018-01-06 NOTE — Progress Notes (Signed)
Date:  01/06/2018   Name:  Sharon York   DOB:  06/06/1945   MRN:  [id number]   Chief Complaint: Urinary Urgency (No pain or burning. Going to the bathroom more often than normal. )  Urinary Frequency   This is a new problem. The current episode started in the past 7 days. The problem occurs every urination. The problem has been unchanged. The patient is experiencing no pain. There has been no fever. Associated symptoms include frequency and urgency. Pertinent negatives include no chills or hematuria. She has tried nothing for the symptoms.  Shoulder Pain   The pain is present in the left shoulder. This is a new problem. Episode onset: 01/02/18 at a hotel in [place]. The quality of the pain is described as aching. The pain is mild. Pertinent negatives include no fever.  Pain in the medial upper arm - not the elbow and shoulder.  She fell onto carpet, did not hit her head and arm was not painful immediately afterwards.  Now it is just achy and feels better after tylenol.  Incontinence - now using an "Attain" device for pelvic floor training at home.  She has been using it for about 6 weeks and is having much more success in controlling her bladder.  Review of Systems  Constitutional: Negative for chills, fatigue and fever.  Eyes: Negative for visual disturbance.  Respiratory: Negative for cough, chest tightness and shortness of breath.   Cardiovascular: Negative for chest pain and palpitations.  Genitourinary: Positive for frequency and urgency. Negative for dysuria, hematuria, vaginal bleeding and vaginal discharge.  Musculoskeletal: Positive for myalgias (left upper arm).  Skin: Negative for rash.  Neurological: Negative for dizziness and headaches.  Psychiatric/Behavioral: Negative for dysphoric mood and sleep disturbance.    Patient Active Problem List   Diagnosis Date Noted  . PMB (postmenopausal bleeding) 11/01/2017  . Hyperlipidemia associated with type 2 diabetes mellitus  ([place]) 06/12/2017  . DM (diabetes mellitus), type 2, uncontrolled ([place]) 11/06/2016  . Tinea corporis 03/17/2015  . Nocturnal cough 03/17/2015  . Abnormal gait 12/15/2014  . Altered bowel function 12/15/2014  . Blood pressure elevated without history of HTN 12/15/2014  . Hemiparesis affecting left side as late effect of cerebrovascular accident (CVA) ([place]) 12/15/2014  . Absence of bladder continence 12/15/2014  . Obstructive sleep apnea of adult 12/15/2014  . Arthritis of shoulder region, degenerative 12/15/2014  . OP (osteoporosis) 12/15/2014  . Allergic rhinitis, seasonal 12/15/2014  . Mixed incontinence 03/11/2013    Allergies  Allergen Reactions  . Nitrofurantoin Nausea Only  . Iodinated Diagnostic Agents Hives and Cough    Patient developed a hive on her left lip after injection as well as some coughing.     Past Surgical History:  Procedure Laterality Date  . CESAREAN SECTION  1975  . COLONOSCOPY  2010   normal    Social History   Tobacco Use  . Smoking status: Never Smoker  . Smokeless tobacco: Never Used  Substance Use Topics  . Alcohol use: No    Alcohol/week: 0.0 standard drinks  . Drug use: No     Medication list has been reviewed and updated.  Current Meds  Medication Sig  . ACCU-CHEK AVIVA PLUS test strip  USE 1 TEST STRIP UP TO 4 TIMES DAILY  . ACCU-CHEK FASTCLIX LANCETS MISC 1 each by Does not apply route 2 (two) times daily.  Marland Kitchen alendronate (FOSAMAX) 70 MG tablet Take 1 tablet (70 mg total) by mouth once a  week. Take with a full glass of water on an empty stomach.  Marland Kitchen aspirin 81 MG chewable tablet Chew 1 tablet by mouth daily.  Marland Kitchen atorvastatin (LIPITOR) 10 MG tablet Take 1 tablet (10 mg total) by mouth daily.  . baclofen (LIORESAL) 10 MG tablet Take 1 tablet (10 mg total) by mouth 2 (two) times daily.  . benzonatate (TESSALON) 100 MG capsule Take 1 capsule (100 mg total) by mouth 3 (three) times daily.  . blood glucose meter kit and supplies KIT  Dispense based on patient and insurance preference. Use up to four times daily as directed. (FOR ICD-9 250.00, 250.01).  . Calcium Carbonate-Vitamin D (CALCIUM 500 + D) 500-125 MG-UNIT TABS Take by mouth.  . fesoterodine (TOVIAZ) 4 MG TB24 tablet Take 1 tablet (4 mg total) by mouth daily.  . metFORMIN (GLUCOPHAGE-XR) 500 MG 24 hr tablet Take 1 tablet (500 mg total) by mouth daily with breakfast.  . Multiple Vitamins-Minerals (MULTIVITAMIN ADULT PO) Take by mouth.    PHQ 2/9 Scores 11/01/2017 08/15/2017 08/05/2017 01/07/2017  PHQ - 2 Score 0 0 0 0    Physical Exam  Constitutional: She appears well-developed and well-nourished.  Neck: Normal range of motion. Neck supple.  Cardiovascular: Normal rate, regular rhythm and normal heart sounds.  Pulmonary/Chest: Effort normal and breath sounds normal. No respiratory distress.  Abdominal: Soft. Bowel sounds are normal. There is tenderness in the suprapubic area. There is no rebound, no guarding and no CVA tenderness.  Musculoskeletal:       Left shoulder: She exhibits decreased range of motion.       Left elbow: She exhibits decreased range of motion.       Arms: Neurological: She is alert.  Psychiatric: She has a normal mood and affect.  Nursing note and vitals reviewed.   BP 108/68 (BP Location: Right Arm, Patient Position: Sitting, Cuff Size: Normal)   Pulse 94   Ht '4\' 11"'  (1.499 m)   Wt 159 lb (72.1 kg)   SpO2 97%   BMI 32.11 kg/m   Assessment and Plan: 1. Acute cystitis without hematuria Continue fluids - POC urinalysis w microscopic (non auto) - cephALEXin (KEFLEX) 500 MG capsule; Take 1 capsule (500 mg total) by mouth 4 (four) times daily for 7 days.  Dispense: 28 capsule; Refill: 0  2. Contusion of left arm, initial encounter With bruising - no obvious fracture and Xrays would be very difficult given the frozen shoulder/elbow. Pt to take tylenol as needed  3. Mixed incontinence improving   Meds ordered this encounter    Medications  . cephALEXin (KEFLEX) 500 MG capsule    Sig: Take 1 capsule (500 mg total) by mouth 4 (four) times daily for 7 days.    Dispense:  28 capsule    Refill:  0    Partially dictated using Editor, commissioning. Any errors are unintentional.  Halina Maidens, MD [facility]  01/06/2018

## 2018-01-08 ENCOUNTER — Ambulatory Visit (INDEPENDENT_AMBULATORY_CARE_PROVIDER_SITE_OTHER): Payer: Medicare Other

## 2018-01-08 VITALS — BP 140/82 | HR 80 | Temp 97.7°F | Resp 12 | Ht 59.0 in | Wt 159.0 lb

## 2018-01-08 DIAGNOSIS — Z0000 Encounter for general adult medical examination without abnormal findings: Secondary | ICD-10-CM | POA: Diagnosis not present

## 2018-01-08 NOTE — Patient Instructions (Addendum)
Sharon York , Thank you for taking time to come for your Medicare Wellness Visit. I appreciate your ongoing commitment to your health goals. Please review the following plan we discussed and let me know if I can assist you in the future.   Screening recommendations/referrals: Colorectal Screening: Please keep your appointment as scheduled Mammogram: Up to date Bone Density: Continue taking your alendronate  Vision and Dental Exams: Recommended annual ophthalmology exams for early detection of glaucoma and other disorders of the eye Recommended annual dental exams for proper oral hygiene  Diabetic Exams: Recommended annual diabetic eye exams for early detection of retinopathy Recommended annual diabetic foot exams for early detection of peripheral neuropathy.  Diabetic Eye Exam: Up to date Diabetic Foot Exam: Up to date  Vaccinations: Influenza vaccine: Up to date Pneumococcal vaccine: Up to date Tdap vaccine: Declined. Please call your insurance company to determine your out of pocket expense. You may also receive this vaccine at your local pharmacy or Health Dept. Shingles vaccine: Please call your insurance company to determine your out of pocket expense for the Shingrix vaccine. You may also receive this vaccine at your local pharmacy or Health Dept.    Advanced directives: Please bring a copy of your POA (Power of Attorney) and/or Living Will to your next appointment.  Goals: Recommend to drink at least 6-8 8oz glasses of water per day.  Next appointment: Please schedule your Annual Wellness Visit with your Nurse Health Advisor in one year.  Preventive Care 865 Years and Older, Female Preventive care refers to lifestyle choices and visits with your health care provider that can promote health and wellness. What does preventive care include?  A yearly physical exam. This is also called an annual well check.  Dental exams once or twice a year.  Routine eye exams. Ask your health  care provider how often you should have your eyes checked.  Personal lifestyle choices, including:  Daily care of your teeth and gums.  Regular physical activity.  Eating a healthy diet.  Avoiding tobacco and drug use.  Limiting alcohol use.  Practicing safe sex.  Taking low-dose aspirin every day.  Taking vitamin and mineral supplements as recommended by your health care provider. What happens during an annual well check? The services and screenings done by your health care provider during your annual well check will depend on your age, overall health, lifestyle risk factors, and family history of disease. Counseling  Your health care provider may ask you questions about your:  Alcohol use.  Tobacco use.  Drug use.  Emotional well-being.  Home and relationship well-being.  Sexual activity.  Eating habits.  History of falls.  Memory and ability to understand (cognition).  Work and work Astronomerenvironment.  Reproductive health. Screening  You may have the following tests or measurements:  Height, weight, and BMI.  Blood pressure.  Lipid and cholesterol levels. These may be checked every 5 years, or more frequently if you are over 74 years old.  Skin check.  Lung cancer screening. You may have this screening every year starting at age 74 if you have a 30-pack-year history of smoking and currently smoke or have quit within the past 15 years.  Fecal occult blood test (FOBT) of the stool. You may have this test every year starting at age 74.  Flexible sigmoidoscopy or colonoscopy. You may have a sigmoidoscopy every 5 years or a colonoscopy every 10 years starting at age 74.  Hepatitis C blood test.  Hepatitis B blood  test.  Sexually transmitted disease (STD) testing.  Diabetes screening. This is done by checking your blood sugar (glucose) after you have not eaten for a while (fasting). You may have this done every 1-3 years.  Bone density scan. This is done  to screen for osteoporosis. You may have this done starting at age 38.  Mammogram. This may be done every 1-2 years. Talk to your health care provider about how often you should have regular mammograms. Talk with your health care provider about your test results, treatment options, and if necessary, the need for more tests. Vaccines  Your health care provider may recommend certain vaccines, such as:  Influenza vaccine. This is recommended every year.  Tetanus, diphtheria, and acellular pertussis (Tdap, Td) vaccine. You may need a Td booster every 10 years.  Zoster vaccine. You may need this after age 42.  Pneumococcal 13-valent conjugate (PCV13) vaccine. One dose is recommended after age 74.  Pneumococcal polysaccharide (PPSV23) vaccine. One dose is recommended after age 36. Talk to your health care provider about which screenings and vaccines you need and how often you need them. This information is not intended to replace advice given to you by your health care provider. Make sure you discuss any questions you have with your health care provider. Document Released: 06/03/2015 Document Revised: 01/25/2016 Document Reviewed: 03/08/2015 Elsevier Interactive Patient Education  2017 [place] Prevention in the Home Falls can cause injuries. They can happen to people of all ages. There are many things you can do to make your home safe and to help prevent falls. What can I do on the outside of my home?  Regularly fix the edges of walkways and driveways and fix any cracks.  Remove anything that might make you trip as you walk through a door, such as a raised step or threshold.  Trim any bushes or trees on the path to your home.  Use bright outdoor lighting.  Clear any walking paths of anything that might make someone trip, such as rocks or tools.  Regularly check to see if handrails are loose or broken. Make sure that both sides of any steps have handrails.  Any raised decks  and porches should have guardrails on the edges.  Have any leaves, snow, or ice cleared regularly.  Use sand or salt on walking paths during winter.  Clean up any spills in your garage right away. This includes oil or grease spills. What can I do in the bathroom?  Use night lights.  Install grab bars by the toilet and in the tub and shower. Do not use towel bars as grab bars.  Use non-skid mats or decals in the tub or shower.  If you need to sit down in the shower, use a plastic, non-slip stool.  Keep the floor dry. Clean up any water that spills on the floor as soon as it happens.  Remove soap buildup in the tub or shower regularly.  Attach bath mats securely with double-sided non-slip rug tape.  Do not have throw rugs and other things on the floor that can make you trip. What can I do in the bedroom?  Use night lights.  Make sure that you have a light by your bed that is easy to reach.  Do not use any sheets or blankets that are too big for your bed. They should not hang down onto the floor.  Have a firm chair that has side arms. You can use this for support while  you get dressed.  Do not have throw rugs and other things on the floor that can make you trip. What can I do in the kitchen?  Clean up any spills right away.  Avoid walking on wet floors.  Keep items that you use a lot in easy-to-reach places.  If you need to reach something above you, use a strong step stool that has a grab bar.  Keep electrical cords out of the way.  Do not use floor polish or wax that makes floors slippery. If you must use wax, use non-skid floor wax.  Do not have throw rugs and other things on the floor that can make you trip. What can I do with my stairs?  Do not leave any items on the stairs.  Make sure that there are handrails on both sides of the stairs and use them. Fix handrails that are broken or loose. Make sure that handrails are as long as the stairways.  Check any  carpeting to make sure that it is firmly attached to the stairs. Fix any carpet that is loose or worn.  Avoid having throw rugs at the top or bottom of the stairs. If you do have throw rugs, attach them to the floor with carpet tape.  Make sure that you have a light switch at the top of the stairs and the bottom of the stairs. If you do not have them, ask someone to add them for you. What else can I do to help prevent falls?  Wear shoes that:  Do not have high heels.  Have rubber bottoms.  Are comfortable and fit you well.  Are closed at the toe. Do not wear sandals.  If you use a stepladder:  Make sure that it is fully opened. Do not climb a closed stepladder.  Make sure that both sides of the stepladder are locked into place.  Ask someone to hold it for you, if possible.  Clearly mark and make sure that you can see:  Any grab bars or handrails.  First and last steps.  Where the edge of each step is.  Use tools that help you move around (mobility aids) if they are needed. These include:  Canes.  Walkers.  Scooters.  Crutches.  Turn on the lights when you go into a dark area. Replace any light bulbs as soon as they burn out.  Set up your furniture so you have a clear path. Avoid moving your furniture around.  If any of your floors are uneven, fix them.  If there are any pets around you, be aware of where they are.  Review your medicines with your doctor. Some medicines can make you feel dizzy. This can increase your chance of falling. Ask your doctor what other things that you can do to help prevent falls. This information is not intended to replace advice given to you by your health care provider. Make sure you discuss any questions you have with your health care provider. Document Released: 03/03/2009 Document Revised: 10/13/2015 Document Reviewed: 06/11/2014 Elsevier Interactive Patient Education  2017 [employer].

## 2018-01-08 NOTE — Progress Notes (Signed)
Subjective:   Sharon York is a 72 y.o. female who presents for Medicare Annual (Subsequent) preventive examination.  Review of Systems:  N/A Cardiac Risk Factors include: advanced age (>19mn, >>51women);diabetes mellitus;dyslipidemia;sedentary lifestyle;obesity (BMI >30kg/m2)     Objective:     Vitals: BP 140/82 (BP Location: Right Arm, Patient Position: Sitting, Cuff Size: Normal)   Pulse 80   Temp 97.7 F (36.5 C) (Oral)   Resp 12   Ht '4\' 11"'  (1.499 m)   Wt 159 lb (72.1 kg)   SpO2 92%   BMI 32.11 kg/m   Body mass index is 32.11 kg/m.  Advanced Directives 01/08/2018 08/15/2017 07/19/2017 06/22/2017 01/07/2017 06/02/2015 02/10/2015  Does Patient Have a Medical Advance Directive? No;Yes Yes No Yes Yes No Yes  Type of [place] - - - [place] - [place]  Does patient want to make changes to medical advance directive? - No - Patient declined - - - - No - Patient declined  Copy of [place]? No - copy requested - - - No - copy requested - No - copy requested  Would patient like information on creating a medical advance directive? - - No - Patient declined - - No - patient declined information -    Tobacco Social History   Tobacco Use  Smoking Status Never Smoker  Smokeless Tobacco Never Used  Tobacco Comment   smoking cessation materials not required     Counseling given: No Comment: smoking cessation materials not required  Clinical Intake:  Pre-visit preparation completed: Yes  Pain : No/denies pain   BMI - recorded: 32.11 Nutritional Status: BMI > 30  Obese Nutritional Risks: None  Nutrition Risk Assessment: Has the patient had any N/V/D within the last 2 months?  No Does the patient have any non-healing wounds?  No Has the patient had any unintentional weight loss or weight gain?  No  Is the patient diabetic?  Yes If  diabetic, was a CBG obtained today?  No Did the patient bring in their glucometer from home?  No Comments: Pt monitors CBG's twice daily. Denies any financial strains with the device or supplies.  Diabetic Exams: Diabetic Eye Exam: Completed 12/19/16.  Diabetic Foot Exam: Completed 02/06/17. Pt has been advised about the importance in completing this exam. Pt is scheduled for diabetic foot exam on 01/14/18.  How often do you need to have someone help you when you read instructions, pamphlets, or other written materials from your doctor or pharmacy?: 1 - Never  Interpreter Needed?: No  Information entered by :: AIdell Pickles LPN  Past Medical History:  Diagnosis Date  . Diabetes mellitus without complication ([place]   . Osteoporosis   . Overactive bladder   . Stroke ([facility]    Past Surgical History:  Procedure Laterality Date  . CESAREAN SECTION  1975  . COLONOSCOPY  2010   normal   Family History  Problem Relation Age of Onset  . Diabetes Mother   . Thyroid disease Mother   . Breast cancer Maternal Aunt   . Breast cancer Cousin        mat cousin  . Prostate cancer Brother   . Kidney failure Brother   . Kidney cancer Son   . Bladder Cancer Neg Hx    Social History   Socioeconomic History  . Marital status: Single    Spouse name: Not on file  . Number  of children: 5  . Years of education: Not on file  . Highest education level: 8th grade  Occupational History  . Occupation: Retired  Scientific laboratory technician  . Financial resource strain: Not hard at all  . Food insecurity:    Worry: Never true    Inability: Never true  . Transportation needs:    Medical: No    Non-medical: No  Tobacco Use  . Smoking status: Never Smoker  . Smokeless tobacco: Never Used  . Tobacco comment: smoking cessation materials not required  Substance and Sexual Activity  . Alcohol use: No    Alcohol/week: 0.0 standard drinks  . Drug use: No  . Sexual activity: Never  Lifestyle  . Physical activity:     Days per week: 0 days    Minutes per session: 0 min  . Stress: Not at all  Relationships  . Social connections:    Talks on phone: Patient refused    Gets together: Patient refused    Attends religious service: Patient refused    Active member of club or organization: Patient refused    Attends meetings of clubs or organizations: Patient refused    Relationship status: Patient refused  Other Topics Concern  . Not on file  Social History Narrative  . Not on file    Outpatient Encounter Medications as of 01/08/2018  Medication Sig  . ACCU-CHEK AVIVA PLUS test strip  USE 1 TEST STRIP UP TO 4 TIMES DAILY  . ACCU-CHEK FASTCLIX LANCETS MISC 1 each by Does not apply route 2 (two) times daily.  Marland Kitchen alendronate (FOSAMAX) 70 MG tablet Take 1 tablet (70 mg total) by mouth once a week. Take with a full glass of water on an empty stomach.  Marland Kitchen aspirin 81 MG chewable tablet Chew 1 tablet by mouth daily.  Marland Kitchen atorvastatin (LIPITOR) 10 MG tablet Take 1 tablet (10 mg total) by mouth daily.  . baclofen (LIORESAL) 10 MG tablet Take 1 tablet (10 mg total) by mouth 2 (two) times daily.  . blood glucose meter kit and supplies KIT Dispense based on patient and insurance preference. Use up to four times daily as directed. (FOR ICD-9 250.00, 250.01).  . Calcium Carbonate-Vitamin D (CALCIUM 500 + D) 500-125 MG-UNIT TABS Take by mouth.  . fesoterodine (TOVIAZ) 4 MG TB24 tablet Take 1 tablet (4 mg total) by mouth daily.  . metFORMIN (GLUCOPHAGE-XR) 500 MG 24 hr tablet Take 1 tablet (500 mg total) by mouth daily with breakfast.  . Multiple Vitamins-Minerals (MULTIVITAMIN ADULT PO) Take by mouth.  . benzonatate (TESSALON) 100 MG capsule Take 1 capsule (100 mg total) by mouth 3 (three) times daily. (Patient not taking: Reported on 01/08/2018)  . cephALEXin (KEFLEX) 500 MG capsule Take 1 capsule (500 mg total) by mouth 4 (four) times daily for 7 days. (Patient not taking: Reported on 01/08/2018)   No facility-administered  encounter medications on file as of 01/08/2018.     Activities of Daily Living In your present state of health, do you have any difficulty performing the following activities: 01/08/2018 08/15/2017  Hearing? N Y  Comment R hearing aid -  Vision? N N  Comment wears eyeglasses -  Difficulty concentrating or making decisions? N N  Walking or climbing stairs? Y Y  Comment avoids stairs -  Dressing or bathing? Y N  Comment needs assistance -  Doing errands, shopping? N Y  Conservation officer, nature and eating ? N N  Comment denies dentures -  Using the Toilet? N  N  In the past six months, have you accidently leaked urine? N Y  Do you have problems with loss of bowel control? N N  Managing your Medications? N N  Managing your Finances? N N  Housekeeping or managing your Housekeeping? N Y  Some recent data might be hidden    Patient Care Team: [name], MD as PCP - General (Internal Medicine) [facility] as Consulting Physician (Ophthalmology)    Assessment:   This is a routine wellness examination for [place].  Exercise Activities and Dietary recommendations Current Exercise Habits: The patient does not participate in regular exercise at present, Exercise limited by: None identified  Goals    . DIET - INCREASE WATER INTAKE     Recommend to drink at least 6-8 8oz glasses of water per day.       Fall Risk Fall Risk  01/08/2018 11/01/2017 08/15/2017 08/05/2017 01/07/2017  Falls in the past year? Yes No No No Yes  Comment - - - - -  Number falls in past yr: 1 - - - 1  Injury with Fall? No - - - Yes  Risk Factor Category  - - - - -  Risk for fall due to : Impaired vision;Impaired balance/gait;Medication side effect - Impaired balance/gait;Impaired mobility Impaired mobility -  Risk for fall due to: Comment wears eyeglasses; ambulates with cane; CVA - - - -  Follow up Falls evaluation completed;Education provided;Falls prevention discussed - - - Falls prevention discussed   FALL RISK  PREVENTION PERTAINING TO HOME: Is your home free of loose throw rugs in walkways, pet beds, electrical cords, etc? Yes Is there adequate lighting in your home to reduce risk of falls?  Yes Are there stairs in or around your home WITH handrails? Yes  ASSISTIVE DEVICES UTILIZED TO PREVENT FALLS: Use of a cane, walker or w/c? Yes, cane Grab bars in the bathroom? No  Shower chair or a place to sit while bathing? Yes An elevated toilet seat or a handicapped toilet? No  Timed Get Up and Go Performed: Yes. Pt ambulated 10 feet within 35 sec. Gait slow, steady and with the use of an assistive device. No intervention required at this time. Fall risk prevention has been discussed.  Community Resource Referral:  Pt declined my offer to send [employer] Referral to Care Guide for installation of grab bars in the shower or an elevated toilet seat.  Depression Screen PHQ 2/9 Scores 01/08/2018 11/01/2017 08/15/2017 08/05/2017  PHQ - 2 Score 0 0 0 0  PHQ- 9 Score 0 - - -     Cognitive Function     6CIT Screen 01/08/2018 01/07/2017  What Year? 0 points 0 points  What month? 0 points 0 points  What time? 0 points 0 points  Count back from 20 0 points 0 points  Months in reverse 0 points 0 points  Repeat phrase 0 points 0 points  Total Score 0 0    Immunization History  Administered Date(s) Administered  . Influenza, High Dose Seasonal PF 02/06/2017  . Influenza,inj,Quad PF,6+ Mos 02/18/2015  . Pneumococcal Conjugate-13 06/22/2016  . Pneumococcal Polysaccharide-23 06/11/2017    Qualifies for Shingles Vaccine? Yes. Due for Shingrix. Education has been provided regarding the importance of this vaccine. Pt has been advised to call insurance company to determine out of pocket expense. Advised may also receive vaccine at local pharmacy or Health Dept. Verbalized acceptance and understanding.  Due for Tdap vaccine. Education has been provided regarding  the importance of this vaccine. Advised  may receive this vaccine at local pharmacy or Health Dept. Aware to provide a copy of the vaccination record if obtained from local pharmacy or Health Dept. Verbalized acceptance and understanding.   Screening Tests Health Maintenance  Topic Date Due  . HEMOGLOBIN A1C  12/09/2017  . INFLUENZA VACCINE  12/19/2017  . OPHTHALMOLOGY EXAM  12/19/2017  . TETANUS/TDAP  01/19/2018 (Originally 07/10/1964)  . COLONOSCOPY  02/17/2018 (Originally 05/22/2017)  . FOOT EXAM  02/06/2018  . URINE MICROALBUMIN  04/08/2018  . MAMMOGRAM  12/12/2018  . DEXA SCAN  Completed  . Hepatitis C Screening  Completed  . PNA vac Low Risk Adult  Completed    Cancer Screenings: Lung: Low Dose CT Chest recommended if Age 57-80 years, 30 pack-year currently smoking OR have quit w/in 15years. Patient does not qualify. Breast:  Up to date on Mammogram? Yes. Completed 12/11/17. Repeat every year   Up to date of Bone Density/Dexa? No. Completed 02/06/12. Results reflect osteopenia. Pt also has a dx of osteoporosis and is taking alendronate.  Colorectal: Completed 05/28/07. States she is scheduled to see Dr. Vicente Males for her screening colonoscopy 01/2018  Additional Screenings: Hepatitis C Screening: Completed 06/11/17    Plan:  I have personally reviewed and addressed the Medicare Annual Wellness questionnaire and have noted the following in the patient's chart:  A. Medical and social history B. Use of alcohol, tobacco or illicit drugs  C. Current medications and supplements D. Functional ability and status E.  Nutritional status F.  Physical activity G. Advance directives H. List of other physicians I.  Hospitalizations, surgeries, and ER visits in previous 12 months J.  [place] such as hearing and vision if needed, cognitive and depression L. Referrals and appointments  In addition, I have reviewed and discussed with patient certain preventive protocols, quality metrics, and best practice recommendations. A  written personalized care plan for preventive services as well as general preventive health recommendations were provided to patient.  Signed,  Aleatha Borer, LPN Nurse Health Advisor  MD Recommendations: Due for Shingrix. Education has been provided regarding the importance of this vaccine. Pt has been advised to call insurance company to determine out of pocket expense. Advised may also receive vaccine at local pharmacy or Health Dept. Verbalized acceptance and understanding.  Due for Tdap vaccine. Education has been provided regarding the importance of this vaccine. Advised may receive this vaccine at local pharmacy or Health Dept. Aware to provide a copy of the vaccination record if obtained from local pharmacy or Health Dept. Verbalized acceptance and understanding.

## 2018-01-09 ENCOUNTER — Ambulatory Visit: Payer: Medicare Other

## 2018-01-13 ENCOUNTER — Ambulatory Visit: Payer: Medicare Other

## 2018-01-14 ENCOUNTER — Encounter: Payer: Self-pay | Admitting: Internal Medicine

## 2018-01-14 ENCOUNTER — Ambulatory Visit (INDEPENDENT_AMBULATORY_CARE_PROVIDER_SITE_OTHER): Payer: Medicare Other | Admitting: Internal Medicine

## 2018-01-14 VITALS — BP 136/82 | HR 70 | Ht 59.0 in | Wt 159.0 lb

## 2018-01-14 DIAGNOSIS — E785 Hyperlipidemia, unspecified: Secondary | ICD-10-CM | POA: Diagnosis not present

## 2018-01-14 DIAGNOSIS — Z0000 Encounter for general adult medical examination without abnormal findings: Secondary | ICD-10-CM

## 2018-01-14 DIAGNOSIS — E1165 Type 2 diabetes mellitus with hyperglycemia: Secondary | ICD-10-CM | POA: Diagnosis not present

## 2018-01-14 DIAGNOSIS — E1169 Type 2 diabetes mellitus with other specified complication: Secondary | ICD-10-CM | POA: Diagnosis not present

## 2018-01-14 DIAGNOSIS — M810 Age-related osteoporosis without current pathological fracture: Secondary | ICD-10-CM

## 2018-01-14 DIAGNOSIS — I69354 Hemiplegia and hemiparesis following cerebral infarction affecting left non-dominant side: Secondary | ICD-10-CM

## 2018-01-14 LAB — POCT URINALYSIS DIPSTICK
Bilirubin, UA: NEGATIVE
Blood, UA: NEGATIVE
Glucose, UA: NEGATIVE
Ketones, UA: NEGATIVE
Leukocytes, UA: NEGATIVE
Nitrite, UA: NEGATIVE
Protein, UA: NEGATIVE
Spec Grav, UA: 1.01 (ref 1.010–1.025)
Urobilinogen, UA: 0.2 E.U./dL
pH, UA: 6.5 (ref 5.0–8.0)

## 2018-01-14 MED ORDER — BACLOFEN 10 MG PO TABS: 10.0000 mg | ORAL_TABLET | Freq: Two times a day (BID) | ORAL | 12 refills | Status: DC

## 2018-01-14 NOTE — Progress Notes (Signed)
Date:  01/14/2018   Name:  Sharon York   DOB:  March 10, 1946   MRN:  [id number]   Chief Complaint: Annual Exam Sharon York is a 72 y.o. female who presents today for her Complete Annual Exam. She feels well. She reports exercising none due to long standing hemiparesis. She reports she is sleeping well.  She was recently seen for MAW - no orders or community referrals done.  Mammogram is up to date.  Colonoscopy is scheduled.  Last DEXA was in 2013.  She still has some dysuria - completed antibiotics for UTI yesterday.  Diabetes  She has type 2 diabetes mellitus. Her disease course has been stable. Pertinent negatives for hypoglycemia include no dizziness, headaches, nervousness/anxiousness or tremors. Associated symptoms include weakness. Pertinent negatives for diabetes include no chest pain, no fatigue, no polydipsia and no polyuria. Current diabetic treatment includes oral agent (monotherapy). She is compliant with treatment all of the time. Eye exam is current.  Hyperlipidemia  This is a chronic problem. Pertinent negatives include no chest pain or shortness of breath. Current antihyperlipidemic treatment includes statins. The current treatment provides significant improvement of lipids.  OP - on fosamax since at least 04/2014. Due for DEXA to monitor and consider drug holiday. UTI - finished antibiotics, still has some mild discomfort but wears depends so is continuously moist.  No flank pain or hematuria.  Lab Results  Component Value Date   HGBA1C 6.3 (H) 06/11/2017   Lab Results  Component Value Date   CREATININE 0.70 07/19/2017   BUN 13 02/06/2017   NA 143 02/06/2017   K 4.3 02/06/2017   CL 101 02/06/2017   CO2 27 02/06/2017   Lab Results  Component Value Date   CHOL 202 (H) 06/11/2017   HDL 57 06/11/2017   LDLCALC 103 (H) 06/11/2017   TRIG 211 (H) 06/11/2017   CHOLHDL 3.5 06/11/2017    Review of Systems  Constitutional: Negative for chills, fatigue and fever.    HENT: Negative for congestion, hearing loss, tinnitus, trouble swallowing and voice change.   Eyes: Negative for visual disturbance.  Respiratory: Negative for cough, chest tightness, shortness of breath and wheezing.   Cardiovascular: Negative for chest pain, palpitations and leg swelling.  Gastrointestinal: Negative for abdominal pain, constipation, diarrhea and vomiting.  Endocrine: Negative for polydipsia and polyuria.  Genitourinary: Positive for dysuria. Negative for flank pain, frequency, genital sores, hematuria, urgency, vaginal bleeding and vaginal discharge.  Musculoskeletal: Positive for gait problem. Negative for arthralgias and joint swelling.  Skin: Negative for color change and rash.  Allergic/Immunologic: Negative for environmental allergies.  Neurological: Positive for weakness. Negative for dizziness, tremors, light-headedness and headaches.  Hematological: Negative for adenopathy. Does not bruise/bleed easily.  Psychiatric/Behavioral: Negative for dysphoric mood and sleep disturbance. The patient is not nervous/anxious.     Patient Active Problem List   Diagnosis Date Noted  . PMB (postmenopausal bleeding) 11/01/2017  . Hyperlipidemia associated with type 2 diabetes mellitus ([place]) 06/12/2017  . DM (diabetes mellitus), type 2, uncontrolled ([place]) 11/06/2016  . Tinea corporis 03/17/2015  . Nocturnal cough 03/17/2015  . Abnormal gait 12/15/2014  . Altered bowel function 12/15/2014  . Blood pressure elevated without history of HTN 12/15/2014  . Hemiparesis affecting left side as late effect of cerebrovascular accident (CVA) ([place]) 12/15/2014  . Absence of bladder continence 12/15/2014  . Obstructive sleep apnea of adult 12/15/2014  . Arthritis of shoulder region, degenerative 12/15/2014  . OP (osteoporosis) 12/15/2014  . Allergic rhinitis, seasonal  12/15/2014  . Mixed incontinence 03/11/2013    Allergies  Allergen Reactions  . Nitrofurantoin Nausea Only  .  Iodinated Diagnostic Agents Hives and Cough    Patient developed a hive on her left lip after injection as well as some coughing.     Past Surgical History:  Procedure Laterality Date  . CESAREAN SECTION  1975  . COLONOSCOPY  2010   normal    Social History   Tobacco Use  . Smoking status: Never Smoker  . Smokeless tobacco: Never Used  . Tobacco comment: smoking cessation materials not required  Substance Use Topics  . Alcohol use: No    Alcohol/week: 0.0 standard drinks  . Drug use: No     Medication list has been reviewed and updated.  Current Meds  Medication Sig  . ACCU-CHEK AVIVA PLUS test strip  USE 1 TEST STRIP UP TO 4 TIMES DAILY  . ACCU-CHEK FASTCLIX LANCETS MISC 1 each by Does not apply route 2 (two) times daily.  Marland Kitchen alendronate (FOSAMAX) 70 MG tablet Take 1 tablet (70 mg total) by mouth once a week. Take with a full glass of water on an empty stomach.  Marland Kitchen aspirin 81 MG chewable tablet Chew 1 tablet by mouth daily.  Marland Kitchen atorvastatin (LIPITOR) 10 MG tablet Take 1 tablet (10 mg total) by mouth daily.  . baclofen (LIORESAL) 10 MG tablet Take 1 tablet (10 mg total) by mouth 2 (two) times daily.  . benzonatate (TESSALON) 100 MG capsule Take 1 capsule (100 mg total) by mouth 3 (three) times daily.  . blood glucose meter kit and supplies KIT Dispense based on patient and insurance preference. Use up to four times daily as directed. (FOR ICD-9 250.00, 250.01).  . Calcium Carbonate-Vitamin D (CALCIUM 500 + D) 500-125 MG-UNIT TABS Take by mouth.  . fesoterodine (TOVIAZ) 4 MG TB24 tablet Take 1 tablet (4 mg total) by mouth daily.  . metFORMIN (GLUCOPHAGE-XR) 500 MG 24 hr tablet Take 1 tablet (500 mg total) by mouth daily with breakfast.  . Multiple Vitamins-Minerals (MULTIVITAMIN ADULT PO) Take by mouth.    PHQ 2/9 Scores 01/08/2018 11/01/2017 08/15/2017 08/05/2017  PHQ - 2 Score 0 0 0 0  PHQ- 9 Score 0 - - -    Physical Exam  Constitutional: She is oriented to person, place,  and time. She appears well-developed and well-nourished. No distress.  HENT:  Head: Normocephalic and atraumatic.  Right Ear: Tympanic membrane and ear canal normal.  Left Ear: Tympanic membrane and ear canal normal.  Nose: Right sinus exhibits no maxillary sinus tenderness. Left sinus exhibits no maxillary sinus tenderness.  Mouth/Throat: Uvula is midline and oropharynx is clear and moist.  Eyes: Conjunctivae and EOM are normal. Right eye exhibits no discharge. Left eye exhibits no discharge. No scleral icterus.  Neck: Normal range of motion. Carotid bruit is not present. No erythema present. No thyromegaly present.  Cardiovascular: Normal rate, regular rhythm, normal heart sounds and normal pulses.  Pulmonary/Chest: Effort normal. No respiratory distress. She has no wheezes. Right breast exhibits no mass, no nipple discharge, no skin change and no tenderness. Left breast exhibits no mass, no nipple discharge, no skin change and no tenderness.  Abdominal: Soft. Bowel sounds are normal. There is no hepatosplenomegaly. There is no tenderness. There is no CVA tenderness.  Lymphadenopathy:    She has no cervical adenopathy.    She has no axillary adenopathy.  Neurological: She is alert and oriented to person, place, and time. She displays  no tremor. No cranial nerve deficit or sensory deficit. She exhibits abnormal muscle tone. Gait abnormal.  Dense left hemiparesis UE Weakness LLE with increased tone  Skin: Skin is warm, dry and intact. No rash noted.  Psychiatric: She has a normal mood and affect. Her speech is normal and behavior is normal. Thought content normal.  Nursing note and vitals reviewed.   BP 136/82 (BP Location: Right Arm, Patient Position: Sitting, Cuff Size: Normal)   Pulse 70   Ht '4\' 11"'  (1.499 m)   Wt 159 lb (72.1 kg)   SpO2 95%   BMI 32.11 kg/m   Assessment and Plan: 1. Annual physical exam Discussed Advanced directive Recent mammogram normal UA negative today -  POCT urinalysis dipstick  2. Uncontrolled type 2 diabetes mellitus with hyperglycemia (HCC) Continue oral agents Schedule eye exam - CBC with Differential/Platelet - Comprehensive metabolic panel - Hemoglobin A1c - TSH  3. Hyperlipidemia associated with type 2 diabetes mellitus ([place]) On statin therapy - Lipid panel  4. Osteoporosis without current pathological fracture, unspecified osteoporosis type Continue Fosamax - DG Bone Density; Future  5. Hemiparesis affecting left side as late effect of cerebrovascular accident (CVA) ([place]) Stable Walking with cane or rolator - baclofen (LIORESAL) 10 MG tablet; Take 1 tablet (10 mg total) by mouth 2 (two) times daily.  Dispense: 60 tablet; Refill: 12   Meds ordered this encounter  Medications  . baclofen (LIORESAL) 10 MG tablet    Sig: Take 1 tablet (10 mg total) by mouth 2 (two) times daily.    Dispense:  60 tablet    Refill:  12    Partially dictated using Editor, commissioning. Any errors are unintentional.  Halina Maidens, MD [facility]  01/14/2018

## 2018-01-14 NOTE — Patient Instructions (Addendum)
Schedule Diabetic Eye exam  Return for high dose flu shot

## 2018-01-15 LAB — CBC WITH DIFFERENTIAL/PLATELET
Basophils Absolute: 0 10*3/uL (ref 0.0–0.2)
Basos: 0 %
EOS (ABSOLUTE): 0.2 10*3/uL (ref 0.0–0.4)
Eos: 5 %
Hematocrit: 37.2 % (ref 34.0–46.6)
Hemoglobin: 11.9 g/dL (ref 11.1–15.9)
Immature Grans (Abs): 0 10*3/uL (ref 0.0–0.1)
Immature Granulocytes: 0 %
Lymphocytes Absolute: 2.2 10*3/uL (ref 0.7–3.1)
Lymphs: 41 %
MCH: 23 pg — ABNORMAL LOW (ref 26.6–33.0)
MCHC: 32 g/dL (ref 31.5–35.7)
MCV: 72 fL — ABNORMAL LOW (ref 79–97)
Monocytes Absolute: 0.5 10*3/uL (ref 0.1–0.9)
Monocytes: 10 %
Neutrophils Absolute: 2.3 10*3/uL (ref 1.4–7.0)
Neutrophils: 44 %
Platelets: 309 10*3/uL (ref 150–450)
RBC: 5.18 x10E6/uL (ref 3.77–5.28)
RDW: 16.8 % — ABNORMAL HIGH (ref 12.3–15.4)
WBC: 5.3 10*3/uL (ref 3.4–10.8)

## 2018-01-15 LAB — COMPREHENSIVE METABOLIC PANEL
ALT: 19 IU/L (ref 0–32)
AST: 22 IU/L (ref 0–40)
Albumin/Globulin Ratio: 1.4 (ref 1.2–2.2)
Albumin: 4.1 g/dL (ref 3.5–4.8)
Alkaline Phosphatase: 56 IU/L (ref 39–117)
BUN/Creatinine Ratio: 17 (ref 12–28)
BUN: 11 mg/dL (ref 8–27)
Bilirubin Total: 0.3 mg/dL (ref 0.0–1.2)
CO2: 25 mmol/L (ref 20–29)
Calcium: 9 mg/dL (ref 8.7–10.3)
Chloride: 102 mmol/L (ref 96–106)
Creatinine, Ser: 0.64 mg/dL (ref 0.57–1.00)
GFR calc Af Amer: 102 mL/min/{1.73_m2} (ref >59)
GFR calc non Af Amer: 88 mL/min/{1.73_m2} (ref >59)
Globulin, Total: 3 g/dL (ref 1.5–4.5)
Glucose: 95 mg/dL (ref 65–99)
Potassium: 4.5 mmol/L (ref 3.5–5.2)
Sodium: 142 mmol/L (ref 134–144)
Total Protein: 7.1 g/dL (ref 6.0–8.5)

## 2018-01-15 LAB — LIPID PANEL
Chol/HDL Ratio: 2.3 ratio (ref 0.0–4.4)
Cholesterol, Total: 141 mg/dL (ref 100–199)
HDL: 61 mg/dL (ref >39)
LDL Calculated: 62 mg/dL (ref 0–99)
Triglycerides: 92 mg/dL (ref 0–149)
VLDL Cholesterol Cal: 18 mg/dL (ref 5–40)

## 2018-01-15 LAB — HEMOGLOBIN A1C
Est. average glucose Bld gHb Est-mCnc: 143 mg/dL
Hgb A1c MFr Bld: 6.6 % — ABNORMAL HIGH (ref 4.8–5.6)

## 2018-01-15 LAB — TSH: TSH: 4.46 u[IU]/mL (ref 0.450–4.500)

## 2018-01-22 ENCOUNTER — Other Ambulatory Visit: Payer: Self-pay | Admitting: Internal Medicine

## 2018-01-22 ENCOUNTER — Encounter: Payer: Self-pay | Admitting: Internal Medicine

## 2018-01-22 ENCOUNTER — Ambulatory Visit (INDEPENDENT_AMBULATORY_CARE_PROVIDER_SITE_OTHER): Payer: Medicare Other | Admitting: Internal Medicine

## 2018-01-22 VITALS — BP 128/64 | HR 72 | Ht 59.0 in | Wt 159.0 lb

## 2018-01-22 DIAGNOSIS — Z23 Encounter for immunization: Secondary | ICD-10-CM

## 2018-01-22 DIAGNOSIS — R350 Frequency of micturition: Secondary | ICD-10-CM

## 2018-01-22 LAB — POC URINALYSIS WITH MICROSCOPIC (NON AUTO)MANUAL RESULT
Bilirubin, UA: NEGATIVE
Crystals: 0
Epithelial cells, urine per micros: 2
Glucose, UA: NEGATIVE
Ketones, UA: NEGATIVE
Mucus, UA: 0
Nitrite, UA: POSITIVE
Protein, UA: NEGATIVE
RBC: 2 M/uL — AB (ref 4.04–5.48)
Spec Grav, UA: 1.01 (ref 1.010–1.025)
Urobilinogen, UA: 0.2 E.U./dL
WBC Casts, UA: 6
pH, UA: 6 (ref 5.0–8.0)

## 2018-01-22 MED ORDER — CEPHALEXIN 500 MG PO CAPS: 500.0000 mg | ORAL_CAPSULE | Freq: Four times a day (QID) | ORAL | 0 refills | Status: AC

## 2018-01-22 NOTE — Progress Notes (Signed)
Date:  01/22/2018   Name:  Sharon York   DOB:  08/11/1945   MRN:  [id number]   Chief Complaint: Urinary Frequency (Thinks she has UTI. Wants to be tested.) and Immunizations (High dose flu vaccine.)  Urinary Frequency   This is a chronic problem. The patient is experiencing no pain. There has been no fever. Associated symptoms include frequency and urgency. Pertinent negatives include no chills or hematuria. She has tried nothing for the symptoms.     Review of Systems  Constitutional: Negative for chills, fatigue and fever.  Respiratory: Negative for chest tightness and shortness of breath.   Cardiovascular: Negative for chest pain.  Gastrointestinal: Negative for abdominal pain.  Genitourinary: Positive for frequency and urgency. Negative for difficulty urinating, dysuria, hematuria and vaginal discharge.    Patient Active Problem List   Diagnosis Date Noted  . PMB (postmenopausal bleeding) 11/01/2017  . Hyperlipidemia associated with type 2 diabetes mellitus ([place]) 06/12/2017  . DM (diabetes mellitus), type 2, uncontrolled ([place]) 11/06/2016  . Tinea corporis 03/17/2015  . Nocturnal cough 03/17/2015  . Abnormal gait 12/15/2014  . Altered bowel function 12/15/2014  . Blood pressure elevated without history of HTN 12/15/2014  . Hemiparesis affecting left side as late effect of cerebrovascular accident (CVA) ([place]) 12/15/2014  . Absence of bladder continence 12/15/2014  . Obstructive sleep apnea of adult 12/15/2014  . Arthritis of shoulder region, degenerative 12/15/2014  . OP (osteoporosis) 12/15/2014  . Allergic rhinitis, seasonal 12/15/2014  . Mixed incontinence 03/11/2013    Allergies  Allergen Reactions  . Nitrofurantoin Nausea Only  . Iodinated Diagnostic Agents Hives and Cough    Patient developed a hive on her left lip after injection as well as some coughing.     Past Surgical History:  Procedure Laterality Date  . CESAREAN SECTION  1975  . COLONOSCOPY  2010     normal    Social History   Tobacco Use  . Smoking status: Never Smoker  . Smokeless tobacco: Never Used  . Tobacco comment: smoking cessation materials not required  Substance Use Topics  . Alcohol use: No    Alcohol/week: 0.0 standard drinks  . Drug use: No     Medication list has been reviewed and updated.  Current Meds  Medication Sig  . ACCU-CHEK AVIVA PLUS test strip  USE 1 TEST STRIP UP TO 4 TIMES DAILY  . ACCU-CHEK FASTCLIX LANCETS MISC 1 each by Does not apply route 2 (two) times daily.  Marland Kitchen alendronate (FOSAMAX) 70 MG tablet Take 1 tablet (70 mg total) by mouth once a week. Take with a full glass of water on an empty stomach.  Marland Kitchen aspirin 81 MG chewable tablet Chew 1 tablet by mouth daily.  Marland Kitchen atorvastatin (LIPITOR) 10 MG tablet Take 1 tablet (10 mg total) by mouth daily.  . baclofen (LIORESAL) 10 MG tablet Take 1 tablet (10 mg total) by mouth 2 (two) times daily.  . benzonatate (TESSALON) 100 MG capsule Take 1 capsule (100 mg total) by mouth 3 (three) times daily.  . blood glucose meter kit and supplies KIT Dispense based on patient and insurance preference. Use up to four times daily as directed. (FOR ICD-9 250.00, 250.01).  . Calcium Carbonate-Vitamin D (CALCIUM 500 + D) 500-125 MG-UNIT TABS Take by mouth.  . fesoterodine (TOVIAZ) 4 MG TB24 tablet Take 1 tablet (4 mg total) by mouth daily.  . metFORMIN (GLUCOPHAGE-XR) 500 MG 24 hr tablet Take 1 tablet (500 mg total) by mouth  daily with breakfast.  . Multiple Vitamins-Minerals (MULTIVITAMIN ADULT PO) Take by mouth.    PHQ 2/9 Scores 01/08/2018 11/01/2017 08/15/2017 08/05/2017  PHQ - 2 Score 0 0 0 0  PHQ- 9 Score 0 - - -    Physical Exam  Constitutional: She appears well-developed and well-nourished.  Cardiovascular: Normal rate, regular rhythm and normal heart sounds.  Pulmonary/Chest: Effort normal and breath sounds normal. No respiratory distress.  Abdominal: Soft. Bowel sounds are normal. There is no tenderness.  There is no rebound, no guarding and no CVA tenderness.  Musculoskeletal: She exhibits no edema.  Neurological: She is alert.  Skin: Skin is warm and dry.  Psychiatric: She has a normal mood and affect.  Nursing note and vitals reviewed.  Urine dipstick shows positive for RBC's, positive for nitrates and positive for leukocytes.  Micro exam: 5 WBC's per HPF.  BP 128/64 (BP Location: Right Arm, Patient Position: Sitting, Cuff Size: Normal)   Pulse 72   Ht '4\' 11"'  (1.499 m)   Wt 159 lb (72.1 kg)   SpO2 96%   BMI 32.11 kg/m   Assessment and Plan: 1. Urinary frequency - POC urinalysis w microscopic (non auto) - cephALEXin (KEFLEX) 500 MG capsule; Take 1 capsule (500 mg total) by mouth 4 (four) times daily for 7 days.  Dispense: 28 capsule; Refill: 0 - Urine Culture  2. Need for influenza vaccination - Flu vaccine HIGH DOSE PF   Meds ordered this encounter  Medications  . cephALEXin (KEFLEX) 500 MG capsule    Sig: Take 1 capsule (500 mg total) by mouth 4 (four) times daily for 7 days.    Dispense:  28 capsule    Refill:  0    Partially dictated using Editor, commissioning. Any errors are unintentional.  Halina Maidens, MD [facility]  01/22/2018

## 2018-01-24 LAB — URINE CULTURE

## 2018-01-27 DIAGNOSIS — Z8371 Family history of colonic polyps: Secondary | ICD-10-CM | POA: Diagnosis not present

## 2018-01-27 DIAGNOSIS — E119 Type 2 diabetes mellitus without complications: Secondary | ICD-10-CM | POA: Diagnosis not present

## 2018-01-29 ENCOUNTER — Encounter: Payer: Self-pay | Admitting: Internal Medicine

## 2018-01-29 ENCOUNTER — Ambulatory Visit (INDEPENDENT_AMBULATORY_CARE_PROVIDER_SITE_OTHER): Payer: Medicare Other | Admitting: Internal Medicine

## 2018-01-29 VITALS — BP 124/68 | HR 77 | Ht 59.0 in | Wt 159.0 lb

## 2018-01-29 DIAGNOSIS — J069 Acute upper respiratory infection, unspecified: Secondary | ICD-10-CM | POA: Diagnosis not present

## 2018-01-29 MED ORDER — BENZONATATE 100 MG PO CAPS: 100.0000 mg | ORAL_CAPSULE | Freq: Three times a day (TID) | ORAL | 0 refills | Status: DC

## 2018-01-29 NOTE — Patient Instructions (Signed)
Continue claritin daily  Can use Flonse nasal spray if needed for ongoing congestion and drainage

## 2018-01-29 NOTE — Progress Notes (Signed)
Date:  01/29/2018   Name:  Sharon York   DOB:  11-07-1945   MRN:  [id number]   Chief Complaint: Cough (Started on Monday. Dry cough. Runny nose. No fever or chills. )  Cough  This is a new problem. The current episode started in the past 7 days. The problem has been unchanged. The problem occurs every few hours. The cough is non-productive. Associated symptoms include nasal congestion and postnasal drip. Pertinent negatives include no chest pain, chills, ear pain, fever, headaches or sore throat. Nothing aggravates the symptoms. She has tried nothing for the symptoms.    Review of Systems  Constitutional: Negative for chills and fever.  HENT: Positive for postnasal drip. Negative for ear pain, sore throat and trouble swallowing.   Respiratory: Positive for cough.   Cardiovascular: Negative for chest pain and palpitations.  Genitourinary: Negative for dysuria, pelvic pain and urgency.  Neurological: Negative for dizziness and headaches.    Patient Active Problem List   Diagnosis Date Noted  . PMB (postmenopausal bleeding) 11/01/2017  . Hyperlipidemia associated with type 2 diabetes mellitus ([place]) 06/12/2017  . DM (diabetes mellitus), type 2, uncontrolled ([place]) 11/06/2016  . Tinea corporis 03/17/2015  . Nocturnal cough 03/17/2015  . Abnormal gait 12/15/2014  . Altered bowel function 12/15/2014  . Blood pressure elevated without history of HTN 12/15/2014  . Hemiparesis affecting left side as late effect of cerebrovascular accident (CVA) ([place]) 12/15/2014  . Absence of bladder continence 12/15/2014  . Obstructive sleep apnea of adult 12/15/2014  . Arthritis of shoulder region, degenerative 12/15/2014  . OP (osteoporosis) 12/15/2014  . Allergic rhinitis, seasonal 12/15/2014  . Mixed incontinence 03/11/2013    Allergies  Allergen Reactions  . Nitrofurantoin Nausea Only  . Iodinated Diagnostic Agents Hives and Cough    Patient developed a hive on her left lip after injection  as well as some coughing.     Past Surgical History:  Procedure Laterality Date  . CESAREAN SECTION  1975  . COLONOSCOPY  2010   normal    Social History   Tobacco Use  . Smoking status: Never Smoker  . Smokeless tobacco: Never Used  . Tobacco comment: smoking cessation materials not required  Substance Use Topics  . Alcohol use: No    Alcohol/week: 0.0 standard drinks  . Drug use: No     Medication list has been reviewed and updated.  Current Meds  Medication Sig  . ACCU-CHEK AVIVA PLUS test strip  USE 1 TEST STRIP UP TO 4 TIMES DAILY  . ACCU-CHEK FASTCLIX LANCETS MISC 1 each by Does not apply route 2 (two) times daily.  Marland Kitchen alendronate (FOSAMAX) 70 MG tablet Take 1 tablet (70 mg total) by mouth once a week. Take with a full glass of water on an empty stomach.  Marland Kitchen aspirin 81 MG chewable tablet Chew 1 tablet by mouth daily.  Marland Kitchen atorvastatin (LIPITOR) 10 MG tablet Take 1 tablet (10 mg total) by mouth daily.  . baclofen (LIORESAL) 10 MG tablet Take 1 tablet (10 mg total) by mouth 2 (two) times daily.  . blood glucose meter kit and supplies KIT Dispense based on patient and insurance preference. Use up to four times daily as directed. (FOR ICD-9 250.00, 250.01).  . Calcium Carbonate-Vitamin D (CALCIUM 500 + D) 500-125 MG-UNIT TABS Take by mouth.  . fesoterodine (TOVIAZ) 4 MG TB24 tablet Take 1 tablet (4 mg total) by mouth daily.  . metFORMIN (GLUCOPHAGE-XR) 500 MG 24 hr tablet Take 1  tablet (500 mg total) by mouth daily with breakfast.  . Multiple Vitamins-Minerals (MULTIVITAMIN ADULT PO) Take by mouth.    PHQ 2/9 Scores 01/08/2018 11/01/2017 08/15/2017 08/05/2017  PHQ - 2 Score 0 0 0 0  PHQ- 9 Score 0 - - -    Physical Exam  Constitutional: She is oriented to person, place, and time. She appears well-developed. No distress.  HENT:  Head: Normocephalic and atraumatic.  Right Ear: Tympanic membrane and ear canal normal.  Left Ear: Tympanic membrane and ear canal normal.  Nose:  Right sinus exhibits no maxillary sinus tenderness and no frontal sinus tenderness. Left sinus exhibits no maxillary sinus tenderness and no frontal sinus tenderness.  Cardiovascular: Normal rate, regular rhythm and normal heart sounds.  Pulmonary/Chest: Effort normal and breath sounds normal. No respiratory distress.  Musculoskeletal: Normal range of motion.  Neurological: She is alert and oriented to person, place, and time.  Skin: Skin is warm and dry. No rash noted.  Psychiatric: She has a normal mood and affect. Her behavior is normal. Thought content normal.  Nursing note and vitals reviewed.   BP 124/68 (BP Location: Right Arm, Patient Position: Sitting, Cuff Size: Normal)   Pulse 77   Ht _0  (1.499 m)   Wt 159 lb (72.1 kg)   SpO2 97%   BMI 32.11 kg/m   Assessment and Plan: 1. Viral URI Continue claritin Use flonase if needed - benzonatate (TESSALON) 100 MG capsule; Take 1 capsule (100 mg total) by mouth 3 (three) times daily.  Dispense: 20 capsule; Refill: 0   Meds ordered this encounter  Medications  . benzonatate (TESSALON) 100 MG capsule    Sig: Take 1 capsule (100 mg total) by mouth 3 (three) times daily.    Dispense:  20 capsule    Refill:  0    Partially dictated using Editor, commissioning. Any errors are unintentional.  Halina Maidens, MD [facility]  01/29/2018

## 2018-02-04 ENCOUNTER — Ambulatory Visit
Admission: RE | Admit: 2018-02-04 | Discharge: 2018-02-04 | Disposition: A | Discharge status: Home / Self Care | Payer: Medicare Other | Source: Ambulatory Visit | Attending: Internal Medicine | Admitting: Internal Medicine

## 2018-02-04 DIAGNOSIS — M85851 Other specified disorders of bone density and structure, right thigh: Secondary | ICD-10-CM | POA: Diagnosis not present

## 2018-02-04 DIAGNOSIS — M810 Age-related osteoporosis without current pathological fracture: Secondary | ICD-10-CM | POA: Insufficient documentation

## 2018-02-05 ENCOUNTER — Ambulatory Visit: Payer: Self-pay | Admitting: *Deleted

## 2018-02-06 ENCOUNTER — Other Ambulatory Visit: Payer: Self-pay | Admitting: Internal Medicine

## 2018-02-06 ENCOUNTER — Encounter: Payer: Self-pay | Admitting: Internal Medicine

## 2018-02-06 ENCOUNTER — Ambulatory Visit (INDEPENDENT_AMBULATORY_CARE_PROVIDER_SITE_OTHER): Payer: Medicare Other | Admitting: Internal Medicine

## 2018-02-06 VITALS — BP 142/82 | HR 77 | Temp 98.2°F | Ht 59.0 in | Wt 159.0 lb

## 2018-02-06 DIAGNOSIS — J069 Acute upper respiratory infection, unspecified: Secondary | ICD-10-CM

## 2018-02-06 DIAGNOSIS — N390 Urinary tract infection, site not specified: Secondary | ICD-10-CM

## 2018-02-06 LAB — POCT URINALYSIS DIPSTICK
Bilirubin, UA: NEGATIVE
Glucose, UA: NEGATIVE
Ketones, UA: NEGATIVE
Nitrite, UA: POSITIVE
Protein, UA: NEGATIVE
Spec Grav, UA: 1.01 (ref 1.010–1.025)
Urobilinogen, UA: 0.2 E.U./dL
pH, UA: 6.5 (ref 5.0–8.0)

## 2018-02-06 MED ORDER — AMOXICILLIN-POT CLAVULANATE 875-125 MG PO TABS: 1.0000 | ORAL_TABLET | Freq: Two times a day (BID) | ORAL | 0 refills | Status: AC

## 2018-02-06 MED ORDER — FLUTICASONE PROPIONATE 50 MCG/ACT NA SUSP: 2.0000 | Freq: Every day | NASAL | 1 refills | Status: DC

## 2018-02-06 NOTE — Patient Instructions (Signed)
Take claritin daily and use Flonase spray daily for sinus/cold/allergy symptoms  We will call you next week with the urine culture results

## 2018-02-06 NOTE — Progress Notes (Signed)
Date:  02/06/2018   Name:  Sharon York   DOB:  06-08-1945   MRN:  [id number]   Chief Complaint: Sinusitis (Runny nose, sneezing. Cough- clear production. No fever or chills. ) and Urinary Urgency (Having to go often. no burning or pain.)  Urinary Tract Infection   This is a recurrent problem. The problem occurs every urination. The problem has been gradually worsening. The quality of the pain is described as burning. The pain is mild. There has been no fever. Associated symptoms include frequency and urgency. Pertinent negatives include no chills, nausea or vomiting. Treatments tried: just completed course of keflex for E coli UTI.  URI   This is a new problem. The current episode started in the past 7 days. The problem has been unchanged. There has been no fever. Associated symptoms include coughing and rhinorrhea. Pertinent negatives include no chest pain, diarrhea, ear pain, headaches, nausea, sore throat or vomiting. The treatment provided mild relief.     Review of Systems  Constitutional: Negative for chills, fatigue and fever.  HENT: Positive for rhinorrhea. Negative for ear pain, sore throat and trouble swallowing.   Respiratory: Positive for cough. Negative for chest tightness and shortness of breath.   Cardiovascular: Negative for chest pain and palpitations.  Gastrointestinal: Negative for constipation, diarrhea, nausea and vomiting.  Genitourinary: Positive for frequency and urgency.  Neurological: Negative for dizziness and headaches.    Patient Active Problem List   Diagnosis Date Noted  . PMB (postmenopausal bleeding) 11/01/2017  . Hyperlipidemia associated with type 2 diabetes mellitus ([place]) 06/12/2017  . DM (diabetes mellitus), type 2, uncontrolled ([place]) 11/06/2016  . Tinea corporis 03/17/2015  . Nocturnal cough 03/17/2015  . Abnormal gait 12/15/2014  . Altered bowel function 12/15/2014  . Blood pressure elevated without history of HTN 12/15/2014  . Hemiparesis  affecting left side as late effect of cerebrovascular accident (CVA) ([place]) 12/15/2014  . Absence of bladder continence 12/15/2014  . Obstructive sleep apnea of adult 12/15/2014  . Arthritis of shoulder region, degenerative 12/15/2014  . OP (osteoporosis) 12/15/2014  . Allergic rhinitis, seasonal 12/15/2014  . Mixed incontinence 03/11/2013    Allergies  Allergen Reactions  . Nitrofurantoin Nausea Only  . Iodinated Diagnostic Agents Hives and Cough    Patient developed a hive on her left lip after injection as well as some coughing.     Past Surgical History:  Procedure Laterality Date  . CESAREAN SECTION  1975  . COLONOSCOPY  2010   normal    Social History   Tobacco Use  . Smoking status: Never Smoker  . Smokeless tobacco: Never Used  . Tobacco comment: smoking cessation materials not required  Substance Use Topics  . Alcohol use: No    Alcohol/week: 0.0 standard drinks  . Drug use: No     Medication list has been reviewed and updated.  Current Meds  Medication Sig  . ACCU-CHEK AVIVA PLUS test strip  USE 1 TEST STRIP UP TO 4 TIMES DAILY  . ACCU-CHEK FASTCLIX LANCETS MISC 1 each by Does not apply route 2 (two) times daily.  Marland Kitchen alendronate (FOSAMAX) 70 MG tablet Take 1 tablet (70 mg total) by mouth once a week. Take with a full glass of water on an empty stomach.  Marland Kitchen aspirin 81 MG chewable tablet Chew 1 tablet by mouth daily.  Marland Kitchen atorvastatin (LIPITOR) 10 MG tablet Take 1 tablet (10 mg total) by mouth daily.  . baclofen (LIORESAL) 10 MG tablet Take 1 tablet (  10 mg total) by mouth 2 (two) times daily.  . blood glucose meter kit and supplies KIT Dispense based on patient and insurance preference. Use up to four times daily as directed. (FOR ICD-9 250.00, 250.01).  . Calcium Carbonate-Vitamin D (CALCIUM 500 + D) 500-125 MG-UNIT TABS Take by mouth.  . fesoterodine (TOVIAZ) 4 MG TB24 tablet Take 1 tablet (4 mg total) by mouth daily.  . metFORMIN (GLUCOPHAGE-XR) 500 MG 24 hr  tablet Take 1 tablet (500 mg total) by mouth daily with breakfast.  . Multiple Vitamins-Minerals (MULTIVITAMIN ADULT PO) Take by mouth.    PHQ 2/9 Scores 01/08/2018 11/01/2017 08/15/2017 08/05/2017  PHQ - 2 Score 0 0 0 0  PHQ- 9 Score 0 - - -    Physical Exam  Constitutional: She appears well-developed and well-nourished.  HENT:  Right Ear: Tympanic membrane and ear canal normal.  Left Ear: Tympanic membrane and ear canal normal.  Nose: Nose normal. Right sinus exhibits no maxillary sinus tenderness. Left sinus exhibits no maxillary sinus tenderness.  Mouth/Throat: Oropharynx is clear and moist.  Neck: Normal range of motion. Neck supple.  Cardiovascular: Normal rate, regular rhythm and normal heart sounds.  Pulmonary/Chest: Effort normal and breath sounds normal. No respiratory distress.  Abdominal: Soft. Bowel sounds are normal. There is tenderness in the suprapubic area. There is no rebound, no guarding and no CVA tenderness.  Neurological: She is alert.  Psychiatric: She has a normal mood and affect.  Nursing note and vitals reviewed.   BP (!) 142/82 (BP Location: Right Arm, Patient Position: Sitting, Cuff Size: Normal)   Pulse 77   Temp 98.2 F (36.8 C) (Oral)   Ht '4\' 11"'  (1.499 m)   Wt 159 lb (72.1 kg)   SpO2 97%   BMI 32.11 kg/m   Assessment and Plan: 1. Recurrent UTI (urinary tract infection) Change to augmentin; repeat cx - POCT urinalysis dipstick - Urine Culture - amoxicillin-clavulanate (AUGMENTIN) 875-125 MG tablet; Take 1 tablet by mouth 2 (two) times daily for 10 days.  Dispense: 20 tablet; Refill: 0  2. Viral URI Continue claritin, add flonase - fluticasone (FLONASE) 50 MCG/ACT nasal spray; Place 2 sprays into both nostrils daily.  Dispense: 16 g; Refill: 1   Partially dictated using Editor, commissioning. Any errors are unintentional.  Halina Maidens, MD [facility]  02/06/2018

## 2018-02-08 LAB — URINE CULTURE

## 2018-02-17 ENCOUNTER — Encounter: Payer: Self-pay | Admitting: Internal Medicine

## 2018-02-17 DIAGNOSIS — H2513 Age-related nuclear cataract, bilateral: Secondary | ICD-10-CM | POA: Diagnosis not present

## 2018-02-17 DIAGNOSIS — E119 Type 2 diabetes mellitus without complications: Secondary | ICD-10-CM | POA: Diagnosis not present

## 2018-02-17 LAB — HM DIABETES EYE EXAM

## 2018-02-18 ENCOUNTER — Other Ambulatory Visit: Payer: Self-pay | Admitting: Internal Medicine

## 2018-02-24 ENCOUNTER — Ambulatory Visit (INDEPENDENT_AMBULATORY_CARE_PROVIDER_SITE_OTHER): Payer: Medicare Other | Admitting: Family Medicine

## 2018-02-24 VITALS — BP 159/82 | HR 85 | Ht 59.0 in

## 2018-02-24 DIAGNOSIS — R350 Frequency of micturition: Secondary | ICD-10-CM

## 2018-02-24 LAB — URINALYSIS, COMPLETE
Bilirubin, UA: NEGATIVE
Glucose, UA: NEGATIVE
Ketones, UA: NEGATIVE
Nitrite, UA: NEGATIVE
Protein, UA: NEGATIVE
Specific Gravity, UA: 1.015 (ref 1.005–1.030)
Urobilinogen, Ur: 0.2 mg/dL (ref 0.2–1.0)
pH, UA: 7.5 (ref 5.0–7.5)

## 2018-02-24 LAB — MICROSCOPIC EXAMINATION

## 2018-02-24 NOTE — Progress Notes (Signed)
Patient presents today with urinary frequency and leakage. She has been on ABX monthly for UTI over the last 4 months. A urine was collected today for UA, UCX. A follow up appointment has been scheduled for recurrent UTI.

## 2018-02-27 ENCOUNTER — Telehealth: Payer: Self-pay | Admitting: Family Medicine

## 2018-02-27 LAB — CULTURE, URINE COMPREHENSIVE

## 2018-02-27 MED ORDER — AMOXICILLIN-POT CLAVULANATE 875-125 MG PO TABS: 1.0000 | ORAL_TABLET | Freq: Two times a day (BID) | ORAL | 0 refills | Status: DC

## 2018-02-27 NOTE — Telephone Encounter (Signed)
-----   Message from Harle Battiest, PA-C sent at 02/27/2018 12:00 PM EDT ----- Please let Mrs. Swingler know that her urine culture was positive and the Augmentin is the appropriate antibiotic.

## 2018-02-27 NOTE — Telephone Encounter (Signed)
-----   Message from [name] A McGowan, PA-C sent at 02/27/2018 12:00 PM EDT ----- Please let Mrs. Guilbert know that her urine culture was positive and the Augmentin is the appropriate antibiotic. 

## 2018-02-27 NOTE — Telephone Encounter (Signed)
Patient notified

## 2018-03-04 ENCOUNTER — Ambulatory Visit: Payer: Medicare Other | Admitting: Urology

## 2018-03-04 ENCOUNTER — Encounter: Payer: Self-pay | Admitting: Urology

## 2018-03-04 NOTE — Progress Notes (Deleted)
03/04/2018 1:19 PM   Sharon York December 04, 1945 [id number]  Referring provider: [name], MD [address] [place], [place] [address]  No chief complaint on file.   HPI: Patient is 71 year old African-American female with recurrent UTI's and a history of hematuria who presents today for follow up.  rUTI's Risk factors: age, vaginal atrophy and incontinence.    UTI's over the last year Positive for E. coli and Proteus Mirabella's that was resistant to nitrofurantoin on February 24, 2018 Positive for E. coli on February 06, 2018 Positive for E. coli on January 22, 2018 Positive for E. coli that was resistant to ampicillin, Cipro, levofloxacin and Septra on April 23, 2017  Today, she is still taking Augmentin for her recent infection from the 7th.      PMH: Past Medical History:  Diagnosis Date  . Diabetes mellitus without complication ([place])   . Osteoporosis   . Overactive bladder   . Stroke [facility])     Surgical History: Past Surgical History:  Procedure Laterality Date  . CESAREAN SECTION  1975  . COLONOSCOPY  2010   normal    Home Medications:  Allergies as of 03/04/2018      Reactions   Nitrofurantoin Nausea Only   Iodinated Diagnostic Agents Hives, Cough   Patient developed a hive on her left lip after injection as well as some coughing.       Medication List        Accurate as of 03/04/18  1:19 PM. Always use your most recent med list.          ACCU-CHEK AVIVA PLUS test strip Generic drug:  glucose blood USE 1 TEST STRIP UP TO 4 TIMES DAILY   ACCU-CHEK FASTCLIX LANCETS Misc 1 each by Does not apply route 2 (two) times daily.   alendronate 70 MG tablet Commonly known as:  FOSAMAX TAKE 1 TABLET BY MOUTH ONCE A WEEK TAKE  WITH  A  FULL  GLASS  OF  WATER  ON  AN  EMPTY  STOMACH   amoxicillin-clavulanate 875-125 MG tablet Commonly known as:  AUGMENTIN Take 1 tablet by mouth every 12 (twelve) hours.   aspirin 81 MG chewable  tablet Chew 1 tablet by mouth daily.   atorvastatin 10 MG tablet Commonly known as:  LIPITOR Take 1 tablet (10 mg total) by mouth daily.   baclofen 10 MG tablet Commonly known as:  LIORESAL Take 1 tablet (10 mg total) by mouth 2 (two) times daily.   blood glucose meter kit and supplies Kit Dispense based on patient and insurance preference. Use up to four times daily as directed. (FOR ICD-9 250.00, 250.01).   CALCIUM 500 + D 500-125 MG-UNIT Tabs Generic drug:  Calcium Carbonate-Vitamin D Take by mouth.   fesoterodine 4 MG Tb24 tablet Commonly known as:  TOVIAZ Take 1 tablet (4 mg total) by mouth daily.   fluticasone 50 MCG/ACT nasal spray Commonly known as:  FLONASE Place 2 sprays into both nostrils daily.   metFORMIN 500 MG 24 hr tablet Commonly known as:  GLUCOPHAGE-XR Take 1 tablet (500 mg total) by mouth daily with breakfast.   MULTIVITAMIN ADULT PO Take by mouth.       Allergies:  Allergies  Allergen Reactions  . Nitrofurantoin Nausea Only  . Iodinated Diagnostic Agents Hives and Cough    Patient developed a hive on her left lip after injection as well as some coughing.     Family History: Family History  Problem Relation Age of Onset  . Diabetes Mother   . Thyroid disease Mother   . Breast cancer Maternal Aunt   . Breast cancer Cousin        mat cousin  . Prostate cancer Brother   . Kidney failure Brother   . Kidney cancer Son   . Bladder Cancer Neg Hx     Social History:  reports that she has never smoked. She has never used smokeless tobacco. She reports that she does not drink alcohol or use drugs.  ROS:                                        Physical Exam: There were no vitals taken for this visit.  Constitutional:  Well nourished. Alert and oriented, No acute distress. HEENT: [place] AT, moist mucus membranes.  Trachea midline, no masses. Cardiovascular: No clubbing, cyanosis, or edema. Respiratory: Normal respiratory  effort, no increased work of breathing. GI: Abdomen is soft, non tender, non distended, no abdominal masses. Liver and spleen not palpable.  No hernias appreciated.  Stool sample for occult testing is not indicated.   GU: No CVA tenderness.  No bladder fullness or masses.  Patient with circumcised/uncircumcised phallus. ***Foreskin easily retracted***  Urethral meatus is patent.  No penile discharge. No penile lesions or rashes. Scrotum without lesions, cysts, rashes and/or edema.  Testicles are located scrotally bilaterally. No masses are appreciated in the testicles. Left and right epididymis are normal. Rectal: Patient with  normal sphincter tone. Anus and perineum without scarring or rashes. No rectal masses are appreciated. Prostate is approximately *** grams, *** nodules are appreciated. Seminal vesicles are normal. Skin: No rashes, bruises or suspicious lesions. Lymph: No cervical or inguinal adenopathy. Neurologic: Grossly intact, no focal deficits, moving all 4 extremities. Psychiatric: Normal mood and affect.  Laboratory Data: Lab Results  Component Value Date   WBC 5.3 01/14/2018   HGB 11.9 01/14/2018   HCT 37.2 01/14/2018   MCV 72 (L) 01/14/2018   PLT 309 01/14/2018    Lab Results  Component Value Date   CREATININE 0.64 01/14/2018    No results found for: PSA  No results found for: TESTOSTERONE  Lab Results  Component Value Date   HGBA1C 6.6 (H) 01/14/2018    Lab Results  Component Value Date   TSH 4.460 01/14/2018       Component Value Date/Time   CHOL 141 01/14/2018 0914   HDL 61 01/14/2018 0914   CHOLHDL 2.3 01/14/2018 0914   LDLCALC 62 01/14/2018 0914    Lab Results  Component Value Date   AST 22 01/14/2018   Lab Results  Component Value Date   ALT 19 01/14/2018   No components found for: ALKALINEPHOPHATASE No components found for: BILIRUBINTOTAL  No results found for: ESTRADIOL  Urinalysis    Component Value Date/Time   COLORURINE STRAW  (A) 06/22/2017 1222   APPEARANCEUR Clear 02/24/2018 1054   LABSPEC 1.015 06/22/2017 1222   PHURINE >9.0 (H) 06/22/2017 1222   GLUCOSEU Negative 02/24/2018 1054   HGBUR LARGE (A) 06/22/2017 1222   BILIRUBINUR Negative 02/24/2018 1054   KETONESUR NEGATIVE 06/22/2017 1222   PROTEINUR Negative 02/24/2018 1054   PROTEINUR 30 (A) 06/22/2017 1222   UROBILINOGEN 0.2 02/06/2018 0821   NITRITE Negative 02/24/2018 1054   NITRITE NEGATIVE 06/22/2017 1222   LEUKOCYTESUR 1+ (A) 02/24/2018 1054    I have  reviewed the labs.   Pertinent Imaging: *** I have independently reviewed the films.    Assessment & Plan:  ***  1. Urinary frequency *** - Bladder Scan (Post Void Residual) in office   No follow-ups on file.  These notes generated with voice recognition software. I apologize for typographical errors.  Zara Council, PA-C  [facility] [address]  [place], [place] [address] [phone]

## 2018-03-10 ENCOUNTER — Ambulatory Visit (INDEPENDENT_AMBULATORY_CARE_PROVIDER_SITE_OTHER): Payer: Medicare Other | Admitting: Urology

## 2018-03-10 ENCOUNTER — Other Ambulatory Visit: Payer: Self-pay

## 2018-03-10 ENCOUNTER — Encounter: Payer: Self-pay | Admitting: Urology

## 2018-03-10 ENCOUNTER — Other Ambulatory Visit
Admission: RE | Admit: 2018-03-10 | Discharge: 2018-03-10 | Disposition: A | Discharge status: Home / Self Care | Payer: Medicare Other | Source: Ambulatory Visit | Attending: Urology | Admitting: Urology

## 2018-03-10 VITALS — BP 154/82 | HR 74 | Wt 156.0 lb

## 2018-03-10 DIAGNOSIS — R350 Frequency of micturition: Secondary | ICD-10-CM

## 2018-03-10 DIAGNOSIS — Z87448 Personal history of other diseases of urinary system: Secondary | ICD-10-CM | POA: Diagnosis not present

## 2018-03-10 LAB — URINALYSIS, COMPLETE (UACMP) WITH MICROSCOPIC
Bacteria, UA: NONE SEEN
Bilirubin Urine: NEGATIVE
Glucose, UA: NEGATIVE mg/dL
Hgb urine dipstick: NEGATIVE
Ketones, ur: NEGATIVE mg/dL
Leukocytes, UA: NEGATIVE
Nitrite: NEGATIVE
Protein, ur: NEGATIVE mg/dL
Specific Gravity, Urine: 1.015 (ref 1.005–1.030)
pH: 8.5 — ABNORMAL HIGH (ref 5.0–8.0)

## 2018-03-10 NOTE — Progress Notes (Signed)
03/10/2018 11:13 PM   Sharon York 03-13-1946 [id number]  Referring provider: [name], MD [address] [place], [place] [address]  Chief Complaint  Patient presents with  . Recurrent UTI    HPI: Patient is 71 year old African-American female with recurrent UTI's and a history of hematuria who presents today for follow up.  rUTI's Risk factors: age, vaginal atrophy and incontinence.  She drinks a quart of water daily.  She is not drinking soda or sweet tea.  She is drinking green tea when it is cool.  She stays away from juices due to her DM.  She does not drink coffee.    UTI's over the last year Positive for E. coli and Proteus Mirabella's that was resistant to nitrofurantoin on February 24, 2018 Positive for E. coli on February 06, 2018 Positive for E. coli on January 22, 2018 Positive for E. coli that was resistant to ampicillin, Cipro, levofloxacin and Septra on April 23, 2017  Today, she finished her antibiotic on Friday.  She feels her infection is better as she does not run to the restroom as often.  She is having urgency, nocturia and incontinence which are baseline.  Patient denies any gross hematuria, dysuria or suprapubic/flank pain.  Patient denies any fevers, chills, nausea or vomiting.   Her UA 0-5 WBC's and 0-5 RBC's.    PMH: Past Medical History:  Diagnosis Date  . Diabetes mellitus without complication ([place])   . Osteoporosis   . Overactive bladder   . Stroke [facility])     Surgical History: Past Surgical History:  Procedure Laterality Date  . CESAREAN SECTION  1975  . COLONOSCOPY  2010   normal    Home Medications:  Allergies as of 03/10/2018      Reactions   Nitrofurantoin Nausea Only   Iodinated Diagnostic Agents Hives, Cough   Patient developed a hive on her left lip after injection as well as some coughing.       Medication List        Accurate as of 03/10/18 11:59 PM. Always use your most recent med list.          ACCU-CHEK AVIVA PLUS test strip Generic drug:  glucose blood USE 1 TEST STRIP UP TO 4 TIMES DAILY   ACCU-CHEK FASTCLIX LANCETS Misc 1 each by Does not apply route 2 (two) times daily.   alendronate 70 MG tablet Commonly known as:  FOSAMAX TAKE 1 TABLET BY MOUTH ONCE A WEEK TAKE  WITH  A  FULL  GLASS  OF  WATER  ON  AN  EMPTY  STOMACH   aspirin 81 MG chewable tablet Chew 1 tablet by mouth daily.   atorvastatin 10 MG tablet Commonly known as:  LIPITOR Take 1 tablet (10 mg total) by mouth daily.   baclofen 10 MG tablet Commonly known as:  LIORESAL Take 1 tablet (10 mg total) by mouth 2 (two) times daily.   blood glucose meter kit and supplies Kit Dispense based on patient and insurance preference. Use up to four times daily as directed. (FOR ICD-9 250.00, 250.01).   CALCIUM 500 + D 500-125 MG-UNIT Tabs Generic drug:  Calcium Carbonate-Vitamin D Take by mouth.   fesoterodine 4 MG Tb24 tablet Commonly known as:  TOVIAZ Take 1 tablet (4 mg total) by mouth daily.   fluticasone 50 MCG/ACT nasal spray Commonly known as:  FLONASE Place 2 sprays into both nostrils daily.   metFORMIN 500 MG 24 hr tablet  Commonly known as:  GLUCOPHAGE-XR Take 1 tablet (500 mg total) by mouth daily with breakfast.   MULTIVITAMIN ADULT PO Take by mouth.       Allergies:  Allergies  Allergen Reactions  . Nitrofurantoin Nausea Only  . Iodinated Diagnostic Agents Hives and Cough    Patient developed a hive on her left lip after injection as well as some coughing.     Family History: Family History  Problem Relation Age of Onset  . Diabetes Mother   . Thyroid disease Mother   . Breast cancer Maternal Aunt   . Breast cancer Cousin        mat cousin  . Prostate cancer Brother   . Kidney failure Brother   . Kidney cancer Son   . Bladder Cancer Neg Hx     Social History:  reports that she has never smoked. She has never used smokeless tobacco. She reports that she does not drink  alcohol or use drugs.  ROS: UROLOGY Frequent Urination?: No Hard to postpone urination?: Yes Burning/pain with urination?: No Get up at night to urinate?: Yes Leakage of urine?: Yes Urine stream starts and stops?: No Trouble starting stream?: No Do you have to strain to urinate?: No Blood in urine?: No Urinary tract infection?: No Sexually transmitted disease?: No Injury to kidneys or bladder?: No Painful intercourse?: No Weak stream?: No Currently pregnant?: No Vaginal bleeding?: No Last menstrual period?: n  Gastrointestinal Nausea?: No Vomiting?: No Indigestion/heartburn?: No Diarrhea?: No Constipation?: Yes  Constitutional Fever: No Night sweats?: No Weight loss?: No Fatigue?: No  Skin Skin rash/lesions?: No Itching?: Yes  Eyes Blurred vision?: No Double vision?: No  Ears/Nose/Throat Sore throat?: No Sinus problems?: Yes  Hematologic/Lymphatic Swollen glands?: No Easy bruising?: No  Cardiovascular Leg swelling?: No Chest pain?: No  Respiratory Cough?: No Shortness of breath?: No  Endocrine Excessive thirst?: No  Musculoskeletal Back pain?: No Joint pain?: No  Neurological Headaches?: No Dizziness?: No  Psychologic Depression?: No Anxiety?: No  Physical Exam: BP (!) 154/82   Pulse 74   Wt 156 lb (70.8 kg)   BMI 31.51 kg/m   Constitutional:  Well nourished. Alert and oriented, No acute distress. HEENT: [place] AT, moist mucus membranes.  Trachea midline, no masses. Cardiovascular: No clubbing, cyanosis, or edema. Respiratory: Normal respiratory effort, no increased work of breathing. Skin: No rashes, bruises or suspicious lesions. Lymph: No cervical or inguinal adenopathy. Neurologic: Grossly intact, no focal deficits, moving all 4 extremities. Psychiatric: Normal mood and affect.  Laboratory Data: Lab Results  Component Value Date   WBC 5.3 01/14/2018   HGB 11.9 01/14/2018   HCT 37.2 01/14/2018   MCV 72 (L) 01/14/2018   PLT  309 01/14/2018    Lab Results  Component Value Date   CREATININE 0.64 01/14/2018    No results found for: PSA  No results found for: TESTOSTERONE  Lab Results  Component Value Date   HGBA1C 6.6 (H) 01/14/2018    Lab Results  Component Value Date   TSH 4.460 01/14/2018       Component Value Date/Time   CHOL 141 01/14/2018 0914   HDL 61 01/14/2018 0914   CHOLHDL 2.3 01/14/2018 0914   LDLCALC 62 01/14/2018 0914    Lab Results  Component Value Date   AST 22 01/14/2018   Lab Results  Component Value Date   ALT 19 01/14/2018   No components found for: ALKALINEPHOPHATASE No components found for: BILIRUBINTOTAL  No results found for: ESTRADIOL  Urinalysis Bland.  See Epic. I have reviewed the labs.  Assessment & Plan:    1. Urinary frequency Patient will continue with conservative management  Bladder Scan (Post Void Residual) in office  2. History of hematuria Hematuria work up completed in 07/2017 - findings positive for fibroids No report of gross hematuria  UA today 0-5 RBC's RTC in one year for UA - patient to report any gross hematuria in the interim        Return for pending urine culture .  These notes generated with voice recognition software. I apologize for typographical errors.  Zara Council, PA-C  [facility] [address]  [place], [place] [address] [phone]

## 2018-03-10 NOTE — Patient Instructions (Signed)
  Apply 0.5mg (pea-sized amount)  just inside the vaginal introitus with a finger-tip on Monday, Wednesday and Friday nights,     

## 2018-03-11 ENCOUNTER — Other Ambulatory Visit: Payer: Self-pay | Admitting: *Deleted

## 2018-03-11 NOTE — Patient Outreach (Signed)
[facility]) Care Management  03/11/2018   Sharon York December 04, 1945 [id number]  RN Health Coach attempted follow up outreach call to patient at 5 as patient requested..  Patient was unavailable. HIPPA compliance voicemail message left with return callback number.  RN Health Coach received return telephone call from patient.  Hipaa compliance verified.Per patient her fasting blood sugar was 117 today. Patient checks her blood sugar twice a day. Per patient she has been having problems with UTI frequently. Patient stated she was on Toviaz with symptoms not improving. Patient bought a Kegel machine and have been exercising twice a week with moderate improvement. Patient stopped taking the [place]. Per patient she has gotten her flu shot,. Patient is not drinking dark colored drinks. Per patient she drinks ginger ale and sprite every once in a while but mostly water. Patient has a routine exercise walking 1000 steps a day. Patient is increasing that to 2000 steps a day to help loose weight. Patient has agreed to follow up outreach calls.   Current Medications:  Current Outpatient Medications  Medication Sig Dispense Refill  . ACCU-CHEK AVIVA PLUS test strip  USE 1 TEST STRIP UP TO 4 TIMES DAILY 400 each 3  . ACCU-CHEK FASTCLIX LANCETS MISC 1 each by Does not apply route 2 (two) times daily. 100 each 3  . alendronate (FOSAMAX) 70 MG tablet TAKE 1 TABLET BY MOUTH ONCE A WEEK TAKE  WITH  A  FULL  GLASS  OF  WATER  ON  AN  EMPTY  STOMACH 12 tablet 4  . aspirin 81 MG chewable tablet Chew 1 tablet by mouth daily.    Marland Kitchen atorvastatin (LIPITOR) 10 MG tablet Take 1 tablet (10 mg total) by mouth daily. 90 tablet 3  . baclofen (LIORESAL) 10 MG tablet Take 1 tablet (10 mg total) by mouth 2 (two) times daily. 60 tablet 12  . blood glucose meter kit and supplies KIT Dispense based on patient and insurance preference. Use up to four times daily as directed. (FOR ICD-9 250.00, 250.01). 1 each 0  .  Calcium Carbonate-Vitamin D (CALCIUM 500 + D) 500-125 MG-UNIT TABS Take by mouth.    . fesoterodine (TOVIAZ) 4 MG TB24 tablet Take 1 tablet (4 mg total) by mouth daily. (Patient not taking: Reported on 03/11/2018) 30 tablet 11  . fluticasone (FLONASE) 50 MCG/ACT nasal spray Place 2 sprays into both nostrils daily. 16 g 1  . metFORMIN (GLUCOPHAGE-XR) 500 MG 24 hr tablet Take 1 tablet (500 mg total) by mouth daily with breakfast. 90 tablet 1  . Multiple Vitamins-Minerals (MULTIVITAMIN ADULT PO) Take by mouth.     No current facility-administered medications for this visit.     Functional Status:  In your present state of health, do you have any difficulty performing the following activities: 03/11/2018 01/08/2018  Hearing? Y N  Comment - R hearing aid  Vision? N N  Comment - wears eyeglasses  Difficulty concentrating or making decisions? N N  Walking or climbing stairs? Y Y  Comment - avoids stairs  Dressing or bathing? N Y  Comment - needs assistance  Doing errands, shopping? Y N  Preparing Food and eating ? N N  Comment - denies dentures  Using the Toilet? N N  In the past six months, have you accidently leaked urine? Y N  Do you have problems with loss of bowel control? N N  Managing your Medications? N N  Managing your Finances? N N  Housekeeping or managing  your Housekeeping? Y N  Some recent data might be hidden    Fall/Depression Screening: Fall Risk  03/11/2018 01/08/2018 11/01/2017  Falls in the past year? No Yes No  Comment - - -  Number falls in past yr: - 1 -  Injury with Fall? - No -  Risk Factor Category  - - -  Risk for fall due to : Impaired balance/gait;Impaired mobility Impaired vision;Impaired balance/gait;Medication side effect -  Risk for fall due to: Comment - wears eyeglasses; ambulates with cane; CVA -  Follow up - Falls evaluation completed;Education provided;Falls prevention discussed -   PHQ 2/9 Scores 03/11/2018 01/08/2018 11/01/2017 08/15/2017 08/05/2017  01/07/2017 11/22/2016  PHQ - 2 Score 0 0 0 0 0 0 0  PHQ- 9 Score - 0 - - - - -   THN CM Care Plan Problem One     Most Recent Value  Care Plan Problem One  Knowledge Deficit in Self Management of Diabetes  Role Documenting the Problem One  [place] for Problem One  Active  THN Long Term Goal   Patient will see a decrease in A1C from 6.6 to goal of 6.0 within the next 90 days  THN Long Term Goal Start Date  03/11/18  Interventions for Problem One Long Term Goal  RN discussed what the patient A1C and fasting blood sugar is.  RN discussed checking and documenting, eating healthy and taking medications as per ordered. RN will monitor for progress   THN CM Short Term Goal #1 Met Date  03/11/18  [facility] CM Short Term Goal #2   Patient will report checking and documenting blood sugars within the next 30 days  THN CM Short Term Goal #2 Met Date  03/11/18  [facility] CM Short Term Goal #3  Patient will identify foods that make up a healthy snack within the next 30 days  THN CM Short Term Goal #3 Start Date  03/11/18  Interventions for Short Tern Goal #3  RN discussed eating a healthy low carb snack between meals. RN sent patient a list of health low carb snacks. RN will follow up with further discussion and teach back  THN CM Short Term Goal #4  Patient will verbalize having a better understanding of healthy diabetic foods choices for dining out within the next 30 days  THN CM Short Term Goal #4 Start Date  03/11/18  Interventions for Short Term Goal #4  RN discussed eating out at fast food restaurants and making healthy choices. RN sent the patient a diabetic dine out menu. RN will follow up for further discussion      Assessment: A1C 6.6 Fasting blood sugar is 117 Patient has an exercise routine Patient will benefit from [facility] telephonic outreach for education and support for diabetes self management.   Plan:  RN discussed fasting blood sugar and A1C RN discussed eating healthy  snacks RN discussed dining out and eating healthy RN sent a 2019/2020 calendar book RN sent a sheet of healthy low carb snacks RN sent dining out choices for diabetics RN will follow up with within the month of December  Ramiel Forti [place] [facility] [phone]

## 2018-03-11 NOTE — Patient Outreach (Signed)
[facility]) Care Management  03/11/2018  Sharon York [birth year] [id number]   RN Health Coach attempted follow up outreach call to patient.  Patient stated she was heading out the door and requested to be called back at 5 pm. unavailable.   Plan: RN will call patient again within 10 business days.  [name] BSN RN [facility] Care Management [phone]

## 2018-03-12 LAB — URINE CULTURE: Culture: <10000 — AB

## 2018-03-17 ENCOUNTER — Encounter: Payer: Self-pay | Admitting: *Deleted

## 2018-03-18 ENCOUNTER — Other Ambulatory Visit: Payer: Self-pay | Admitting: Gastroenterology

## 2018-03-18 ENCOUNTER — Encounter: Payer: Self-pay | Admitting: *Deleted

## 2018-03-18 ENCOUNTER — Ambulatory Visit: Payer: Medicare Other | Admitting: Certified Registered"

## 2018-03-18 ENCOUNTER — Encounter
Admission: RE | Disposition: A | Discharge status: Home / Self Care | Payer: Self-pay | Source: Ambulatory Visit | Attending: Gastroenterology

## 2018-03-18 ENCOUNTER — Ambulatory Visit
Admission: RE | Admit: 2018-03-18 | Discharge: 2018-03-18 | Disposition: A | Discharge status: Home / Self Care | Payer: Medicare Other | Source: Ambulatory Visit | Attending: Gastroenterology | Admitting: Gastroenterology

## 2018-03-18 DIAGNOSIS — Z91041 Radiographic dye allergy status: Secondary | ICD-10-CM | POA: Insufficient documentation

## 2018-03-18 DIAGNOSIS — M810 Age-related osteoporosis without current pathological fracture: Secondary | ICD-10-CM | POA: Insufficient documentation

## 2018-03-18 DIAGNOSIS — K6289 Other specified diseases of anus and rectum: Secondary | ICD-10-CM | POA: Diagnosis not present

## 2018-03-18 DIAGNOSIS — Z8371 Family history of colonic polyps: Secondary | ICD-10-CM | POA: Diagnosis not present

## 2018-03-18 DIAGNOSIS — I6930 Unspecified sequelae of cerebral infarction: Secondary | ICD-10-CM | POA: Insufficient documentation

## 2018-03-18 DIAGNOSIS — Z7984 Long term (current) use of oral hypoglycemic drugs: Secondary | ICD-10-CM | POA: Insufficient documentation

## 2018-03-18 DIAGNOSIS — Z7982 Long term (current) use of aspirin: Secondary | ICD-10-CM | POA: Diagnosis not present

## 2018-03-18 DIAGNOSIS — M1990 Unspecified osteoarthritis, unspecified site: Secondary | ICD-10-CM | POA: Diagnosis not present

## 2018-03-18 DIAGNOSIS — G4730 Sleep apnea, unspecified: Secondary | ICD-10-CM | POA: Insufficient documentation

## 2018-03-18 DIAGNOSIS — Z881 Allergy status to other antibiotic agents status: Secondary | ICD-10-CM | POA: Diagnosis not present

## 2018-03-18 DIAGNOSIS — E119 Type 2 diabetes mellitus without complications: Secondary | ICD-10-CM | POA: Diagnosis not present

## 2018-03-18 DIAGNOSIS — R6889 Other general symptoms and signs: Secondary | ICD-10-CM

## 2018-03-18 DIAGNOSIS — Z79899 Other long term (current) drug therapy: Secondary | ICD-10-CM | POA: Diagnosis not present

## 2018-03-18 DIAGNOSIS — N3281 Overactive bladder: Secondary | ICD-10-CM | POA: Insufficient documentation

## 2018-03-18 DIAGNOSIS — Z539 Procedure and treatment not carried out, unspecified reason: Secondary | ICD-10-CM | POA: Insufficient documentation

## 2018-03-18 DIAGNOSIS — Z1211 Encounter for screening for malignant neoplasm of colon: Secondary | ICD-10-CM | POA: Diagnosis not present

## 2018-03-18 SURGERY — COLONOSCOPY WITH PROPOFOL
Anesthesia: General

## 2018-03-18 MED ORDER — SODIUM CHLORIDE 0.9 % IV SOLN: INTRAVENOUS | Status: DC

## 2018-03-18 NOTE — H&P (Signed)
Outpatient short stay form Pre-procedure 03/18/2018 9:35 AM Sharon Sails MD  Primary Physician: Halina Maidens, MD  Reason for visit: Colonoscopy  History of present illness: Patient is a 72 year old female presenting today for colonoscopy because of family history: Polyps.  Tolerated her prep well.  Was some concern as to whether this was clear enough and a rectal examination was done in preop showing no stool in the vault however she was noted to have a marked anterior protrusion.  At first patient stated that she used her device she put in and took out herself the vagina that sound like a pessary.  Since that would need to come out to the colonoscopy reviewed her GYN notes in the epic system.  Not find evidence of pessary use so contacted her gynecologist who kindly came to the Endo department and examined her.  She does have a history apparently of several large highly calcified uterine fibromas.  This could be 1 of these structures.  This was poorly mobile on rectal examination in the anterior rectal wall smooth out other defect.  She takes no aspirin or blood thinning agent.   No current facility-administered medications for this encounter.   Medications Prior to Admission  Medication Sig Dispense Refill Last Dose  . ACCU-CHEK AVIVA PLUS test strip  USE 1 TEST STRIP UP TO 4 TIMES DAILY 400 each 3 03/18/2018 at Unknown time  . ACCU-CHEK FASTCLIX LANCETS MISC 1 each by Does not apply route 2 (two) times daily. 100 each 3 03/18/2018 at Unknown time  . alendronate (FOSAMAX) 70 MG tablet TAKE 1 TABLET BY MOUTH ONCE A WEEK TAKE  WITH  A  FULL  GLASS  OF  WATER  ON  AN  EMPTY  STOMACH 12 tablet 4 Past Week at Unknown time  . aspirin 81 MG chewable tablet Chew 1 tablet by mouth daily.   Past Week at Unknown time  . atorvastatin (LIPITOR) 10 MG tablet Take 1 tablet (10 mg total) by mouth daily. 90 tablet 3 03/17/2018 at Unknown time  . baclofen (LIORESAL) 10 MG tablet Take 1 tablet (10 mg  total) by mouth 2 (two) times daily. 60 tablet 12 03/17/2018 at Unknown time  . blood glucose meter kit and supplies KIT Dispense based on patient and insurance preference. Use up to four times daily as directed. (FOR ICD-9 250.00, 250.01). 1 each 0 03/18/2018 at Unknown time  . Calcium Carbonate-Vitamin D (CALCIUM 500 + D) 500-125 MG-UNIT TABS Take by mouth.   03/17/2018 at Unknown time  . fluticasone (FLONASE) 50 MCG/ACT nasal spray Place 2 sprays into both nostrils daily. 16 g 1 Past Week at Unknown time  . metFORMIN (GLUCOPHAGE-XR) 500 MG 24 hr tablet Take 1 tablet (500 mg total) by mouth daily with breakfast. 90 tablet 1 03/17/2018 at Unknown time  . Multiple Vitamins-Minerals (MULTIVITAMIN ADULT PO) Take by mouth.   Past Week at Unknown time  . fesoterodine (TOVIAZ) 4 MG TB24 tablet Take 1 tablet (4 mg total) by mouth daily. (Patient not taking: Reported on 03/11/2018) 30 tablet 11 Not Taking at Unknown time     Allergies  Allergen Reactions  . Nitrofurantoin Nausea Only  . Iodinated Diagnostic Agents Hives and Cough    Patient developed a hive on her left lip after injection as well as some coughing.      Past Medical History:  Diagnosis Date  . Diabetes mellitus without complication ([place])   . Osteoporosis   . Overactive bladder   .  Stroke [facility])     Review of systems:      Physical Exam    Heart and lungs    HEENT:     Other:     Pertinant exam for procedure:     Planned proceedures: Patient presenting for colonoscopy screening due to family history of colon polyps.  Abnormal rectal exam noted.  Further discussion with Dr Leonides Schanz GYN, I decided to hold on the colonoscopy today and obtain a less invasive test, CT colonography as alternative testing.  My concern is injuring the rectal wall if the lesion is not mobile enough to move with the scope.  If that test indicates a high risk lesion, colonoscopy can then be considered. I explained this in detail to patient and  family /daughter.  They are in agreement.     Sharon Sails, MD Gastroenterology 03/18/2018  9:35 AM

## 2018-03-18 NOTE — Anesthesia Preprocedure Evaluation (Signed)
Anesthesia Evaluation  Patient identified by MRN, date of birth, ID band Patient awake    Reviewed: Allergy & Precautions, H&P , NPO status , Patient's Chart, lab work & pertinent test results  History of Anesthesia Complications Negative for: history of anesthetic complications  Airway Mallampati: III  TM Distance: >3 FB Neck ROM: full    Dental  (+) Chipped, Upper Dentures, Lower Dentures   Pulmonary sleep apnea ,           Cardiovascular (-) Past MI negative cardio ROS       Neuro/Psych CVA, Residual Symptoms negative psych ROS   GI/Hepatic negative GI ROS, Neg liver ROS,   Endo/Other  diabetes, Type 2, Oral Hypoglycemic Agents  Renal/GU negative Renal ROS  negative genitourinary   Musculoskeletal  (+) Arthritis ,   Abdominal   Peds  Hematology negative hematology ROS (+)   Anesthesia Other Findings Past Medical History: No date: Diabetes mellitus without complication ([facility]) No date: Osteoporosis No date: Overactive bladder No date: Stroke [facility])  Past Surgical History: 1975: CESAREAN SECTION 2010: COLONOSCOPY     Comment:  normal  BMI    Body Mass Index:  35.20 kg/m      Reproductive/Obstetrics negative OB ROS                             Anesthesia Physical Anesthesia Plan  ASA: III  Anesthesia Plan: General   Post-op Pain Management:    Induction: Intravenous  PONV Risk Score and Plan: Propofol infusion and TIVA  Airway Management Planned: Natural Airway and Nasal Cannula  Additional Equipment:   Intra-op Plan:   Post-operative Plan:   Informed Consent: I have reviewed the patients History and Physical, chart, labs and discussed the procedure including the risks, benefits and alternatives for the proposed anesthesia with the patient or authorized representative who has indicated his/her understanding and acceptance.   Dental Advisory Given  Plan Discussed  with: Anesthesiologist, CRNA and Surgeon  Anesthesia Plan Comments: (Patient consented for risks of anesthesia including but not limited to:  - adverse reactions to medications - risk of intubation if required - damage to teeth, lips or other oral mucosa - sore throat or hoarseness - Damage to heart, brain, lungs or loss of life  Patient voiced understanding.)        Anesthesia Quick Evaluation

## 2018-03-26 ENCOUNTER — Other Ambulatory Visit: Payer: Self-pay | Admitting: Internal Medicine

## 2018-03-26 DIAGNOSIS — IMO0001 Reserved for inherently not codable concepts without codable children: Secondary | ICD-10-CM

## 2018-03-26 DIAGNOSIS — E1165 Type 2 diabetes mellitus with hyperglycemia: Principal | ICD-10-CM

## 2018-03-31 ENCOUNTER — Encounter: Payer: Self-pay | Admitting: Internal Medicine

## 2018-03-31 ENCOUNTER — Ambulatory Visit (INDEPENDENT_AMBULATORY_CARE_PROVIDER_SITE_OTHER): Payer: Medicare Other | Admitting: Internal Medicine

## 2018-03-31 VITALS — BP 140/80 | HR 80 | Resp 16 | Ht <= 58 in | Wt 157.0 lb

## 2018-03-31 DIAGNOSIS — R21 Rash and other nonspecific skin eruption: Secondary | ICD-10-CM

## 2018-03-31 DIAGNOSIS — M25511 Pain in right shoulder: Secondary | ICD-10-CM | POA: Diagnosis not present

## 2018-03-31 DIAGNOSIS — I69354 Hemiplegia and hemiparesis following cerebral infarction affecting left non-dominant side: Secondary | ICD-10-CM | POA: Diagnosis not present

## 2018-03-31 MED ORDER — TRIAMCINOLONE ACETONIDE 0.1 % EX CREA: 1.0000 "application " | TOPICAL_CREAM | Freq: Two times a day (BID) | CUTANEOUS | 0 refills | Status: DC

## 2018-03-31 NOTE — Progress Notes (Signed)
Date:  03/31/2018   Name:  Sharon York   DOB:  Sep 25, 1945   MRN:  [id number]   Chief Complaint: Hair/Scalp Problem (itching in scalp area....had beauty shop give good washing but still itching. x 2  weeks ) and Arm Pain (Right arm pain in muscles )  Arm Pain   There was no injury mechanism. The pain is present in the right shoulder. The quality of the pain is described as aching. The pain is mild. The pain has been worsening since the incident. Pertinent negatives include no chest pain. The symptoms are aggravated by lifting and movement (she has left sided arm paresis from stroke and uses her right arm exclusively). She has tried acetaminophen for the symptoms. The treatment provided mild relief.  Rash  This is a new problem. The affected locations include the left ear and scalp. The rash is characterized by itchiness. She was exposed to nothing. Pertinent negatives include no cough, fatigue, fever or shortness of breath. Past treatments include nothing.  CVA - remote CVA with dense LUE hemiparesis and moderate LLE weakness.  Walks with a walker.  Very independent but starting to have more trouble with household chores, preparing meals, bathing. Her insurance told her that I could write her a prescription for help.  Review of Systems  Constitutional: Negative for chills, fatigue and fever.  Respiratory: Negative for cough, chest tightness, shortness of breath and wheezing.   Cardiovascular: Negative for chest pain and palpitations.  Musculoskeletal: Positive for arthralgias and gait problem.  Skin: Positive for rash (area beside left ear). Negative for color change.  Neurological: Positive for weakness. Negative for dizziness and headaches.  Psychiatric/Behavioral: Negative for sleep disturbance.    Patient Active Problem List   Diagnosis Date Noted  . PMB (postmenopausal bleeding) 11/01/2017  . Hyperlipidemia associated with type 2 diabetes mellitus ([place]) 06/12/2017  . DM  (diabetes mellitus), type 2, uncontrolled ([place]) 11/06/2016  . Tinea corporis 03/17/2015  . Nocturnal cough 03/17/2015  . Abnormal gait 12/15/2014  . Altered bowel function 12/15/2014  . Blood pressure elevated without history of HTN 12/15/2014  . Hemiparesis affecting left side as late effect of cerebrovascular accident (CVA) ([place]) 12/15/2014  . Absence of bladder continence 12/15/2014  . Obstructive sleep apnea of adult 12/15/2014  . Arthritis of shoulder region, degenerative 12/15/2014  . OP (osteoporosis) 12/15/2014  . Allergic rhinitis, seasonal 12/15/2014  . Mixed incontinence 03/11/2013    Allergies  Allergen Reactions  . Nitrofurantoin Nausea Only  . Iodinated Diagnostic Agents Hives and Cough    Patient developed a hive on her left lip after injection as well as some coughing.     Past Surgical History:  Procedure Laterality Date  . CESAREAN SECTION  1975  . COLONOSCOPY  2010   normal    Social History   Tobacco Use  . Smoking status: Never Smoker  . Smokeless tobacco: Never Used  . Tobacco comment: smoking cessation materials not required  Substance Use Topics  . Alcohol use: No    Alcohol/week: 0.0 standard drinks  . Drug use: Never     Medication list has been reviewed and updated.  Current Meds  Medication Sig  . ACCU-CHEK AVIVA PLUS test strip  USE 1 TEST STRIP UP TO 4 TIMES DAILY  . ACCU-CHEK FASTCLIX LANCETS MISC 1 each by Does not apply route 2 (two) times daily.  Marland Kitchen alendronate (FOSAMAX) 70 MG tablet TAKE 1 TABLET BY MOUTH ONCE A WEEK TAKE  WITH  A  FULL  GLASS  OF  WATER  ON  AN  EMPTY  STOMACH  . aspirin 81 MG chewable tablet Chew 1 tablet by mouth daily.  . atorvastatin (LIPITOR) 10 MG tablet Take 1 tablet (10 mg total) by mouth daily.  . baclofen (LIORESAL) 10 MG tablet Take 1 tablet (10 mg total) by mouth 2 (two) times daily.  . blood glucose meter kit and supplies KIT Dispense based on patient and insurance preference. Use up to four times  daily as directed. (FOR ICD-9 250.00, 250.01).  . Calcium Carbonate-Vitamin D (CALCIUM 500 + D) 500-125 MG-UNIT TABS Take by mouth.  . metFORMIN (GLUCOPHAGE-XR) 500 MG 24 hr tablet Take 1 tablet (500 mg total) by mouth daily with breakfast.  . Multiple Vitamins-Minerals (MULTIVITAMIN ADULT PO) Take by mouth.  . [DISCONTINUED] fesoterodine (TOVIAZ) 4 MG TB24 tablet Take 1 tablet (4 mg total) by mouth daily.  . [DISCONTINUED] fluticasone (FLONASE) 50 MCG/ACT nasal spray Place 2 sprays into both nostrils daily.  . [DISCONTINUED] metFORMIN (GLUCOPHAGE-XR) 500 MG 24 hr tablet TAKE 1 TABLET BY MOUTH WITH BREAKFAST    PHQ 2/9 Scores 03/31/2018 03/11/2018 01/08/2018 11/01/2017  PHQ - 2 Score 0 0 0 0  PHQ- 9 Score - - 0 -    Physical Exam  Constitutional: She is oriented to person, place, and time. She appears well-developed. No distress.  HENT:  Head: Normocephalic and atraumatic.  Neck: Normal range of motion. Neck supple.  Cardiovascular: Normal rate, regular rhythm and normal heart sounds.  Pulmonary/Chest: Effort normal and breath sounds normal. No respiratory distress.  Musculoskeletal: Normal range of motion.  Lymphadenopathy:    She has no cervical adenopathy.  Neurological: She is alert and oriented to person, place, and time.  Skin: Skin is warm and dry. No rash noted.  Anterior to left ear - cluster of 1 mm SK  Posterior scalp - no lesions seen in the area of sx  Psychiatric: She has a normal mood and affect. Her behavior is normal. Thought content normal.  Nursing note and vitals reviewed.   BP 140/80   Pulse 80   Resp 16   Ht 4' 8" (1.422 m)   Wt 157 lb (71.2 kg)   SpO2 96%   BMI 35.20 kg/m   Assessment and Plan: 1. Right shoulder pain, unspecified chronicity Needs further evaluation and treatment since she relies solely on her right arm - Ambulatory referral to Orthopedic Surgery  2. Rash and nonspecific skin eruption Use TAC twice a day to itching areas -  triamcinolone cream (KENALOG) 0.1 %; Apply 1 application topically 2 (two) times daily.  Dispense: 30 g; Refill: 0  3. Hemiparesis affecting left side as late effect of cerebrovascular accident (CVA) (HCC) Stable but having more trouble due to arm pain - AMB Referral to [facility] Care Management   Partially dictated using Dragon software. Any errors are unintentional.  Laura Berglund, MD [facility]  03/31/2018     

## 2018-04-03 ENCOUNTER — Other Ambulatory Visit: Payer: Self-pay

## 2018-04-03 NOTE — Patient Outreach (Signed)
[facility] Care Management  04/03/2018  De BurrsJoanna M Shrout 11-Mar-1946 [id number]  Successful outreach to the patient on today's date, HIPAA identifiers confirmed. BSW introduced self to the patient and the reason for today's call, indicating the patient had been referred for assistance in establishing an in-home caregiver. The patient reports she needs assistance with dressing, meal preparation, and getting up her stairs to shower. The patient states she is independent in bathing but can not access the shower without help.  The patient lives with her daughter who is a Runner, broadcasting/film/videoteacher and is not available during the day. The patient states she "she does not to bother" her daughter in the evenings. The patient reports a monthly income over [facility]. BSW discussed community program provided by [facility] versus private pay. The patient reports she does not wish to private pay for a caregiver.  BSW placed a call to [facility] to refer the patient to their case management program. BSW was informed the patient would not qualify considering she lives with her daughter and has a stable support person. Furthermore, BSW was informed the program was not accepting any new referrals at this time.  Plan: BSW to contact the patient in the next week to discuss outcome of call with [facility]. BSW will offer to provide the patient with a list of in-home providers who service [place].  Bevelyn NgoKendra Larita Deremer, BSW, CDP [facility] Social Worker [phone]

## 2018-04-08 ENCOUNTER — Other Ambulatory Visit: Payer: Self-pay

## 2018-04-08 ENCOUNTER — Ambulatory Visit: Payer: Self-pay

## 2018-04-08 DIAGNOSIS — M25511 Pain in right shoulder: Secondary | ICD-10-CM | POA: Diagnosis not present

## 2018-04-08 DIAGNOSIS — M75101 Unspecified rotator cuff tear or rupture of right shoulder, not specified as traumatic: Secondary | ICD-10-CM | POA: Diagnosis not present

## 2018-04-08 DIAGNOSIS — G8929 Other chronic pain: Secondary | ICD-10-CM | POA: Diagnosis not present

## 2018-04-08 DIAGNOSIS — M19011 Primary osteoarthritis, right shoulder: Secondary | ICD-10-CM | POA: Diagnosis not present

## 2018-04-08 NOTE — Patient Outreach (Signed)
**Note Sharon-Identified via Obfuscation** [facility] Care Management  04/08/2018  Sharon York September 16, 1945 [id number]  Unsuccessful outreach to the patient on today's date in efforts to continue discussing caregiver resource needs. BSW left a HIPAA compliant voice message requesting a return call.  Plan: BSW to attempt to outreach the patient within the next four business days pending no return call is received.  Bevelyn NgoKendra Cyan Moultrie, BSW, CDP [facility] Care [facility] Social Worker [phone]

## 2018-04-14 ENCOUNTER — Ambulatory Visit: Payer: Self-pay

## 2018-04-14 ENCOUNTER — Other Ambulatory Visit: Payer: Self-pay

## 2018-04-14 NOTE — Patient Outreach (Signed)
**Note Sharon-Identified via Obfuscation** [name]) Care Management  04/14/2018  Sharon York 01/12/1946 [id number]  Second unsuccessful outreach to the patient to discuss caregiver resources. BSW left a HIPAA compliant voice message requesting a return call.  Plan: BSW to mail an unsuccessful outreach letter. BSW to attempt a third and final outreach to the patient within the next four business days.  Sharon York, BSW, CDP [facility] Care [facility] Social Worker [phone]

## 2018-04-21 ENCOUNTER — Inpatient Hospital Stay: Admission: RE | Admit: 2018-04-21 | Payer: Medicare Other | Source: Ambulatory Visit

## 2018-04-21 ENCOUNTER — Other Ambulatory Visit: Payer: Self-pay

## 2018-04-21 ENCOUNTER — Encounter: Payer: Self-pay | Admitting: Internal Medicine

## 2018-04-21 ENCOUNTER — Ambulatory Visit: Payer: Self-pay

## 2018-04-21 ENCOUNTER — Ambulatory Visit: Payer: Medicare Other | Admitting: Internal Medicine

## 2018-04-21 VITALS — BP 124/76 | HR 71 | Ht <= 58 in

## 2018-04-21 DIAGNOSIS — R350 Frequency of micturition: Secondary | ICD-10-CM | POA: Diagnosis not present

## 2018-04-21 DIAGNOSIS — E1169 Type 2 diabetes mellitus with other specified complication: Secondary | ICD-10-CM | POA: Diagnosis not present

## 2018-04-21 DIAGNOSIS — E119 Type 2 diabetes mellitus without complications: Secondary | ICD-10-CM | POA: Insufficient documentation

## 2018-04-21 DIAGNOSIS — E118 Type 2 diabetes mellitus with unspecified complications: Secondary | ICD-10-CM | POA: Insufficient documentation

## 2018-04-21 DIAGNOSIS — E1165 Type 2 diabetes mellitus with hyperglycemia: Secondary | ICD-10-CM | POA: Diagnosis not present

## 2018-04-21 LAB — POCT URINALYSIS DIPSTICK
Bilirubin, UA: NEGATIVE
Glucose, UA: NEGATIVE
Ketones, UA: NEGATIVE
Leukocytes, UA: NEGATIVE
Nitrite, UA: NEGATIVE
Protein, UA: NEGATIVE
Spec Grav, UA: 1.01 (ref 1.010–1.025)
Urobilinogen, UA: 0.2 E.U./dL
pH, UA: 7 (ref 5.0–8.0)

## 2018-04-21 NOTE — Patient Outreach (Signed)
**Note Sharon-Identified via Obfuscation** [facility] Care Management  04/21/2018  Sharon BurrsJoanna M York 01/30/1946 [id number]  Third unsuccessful outreach to the patient on today's date. BSW left a HIPAA compliant voice message requesting a return call.  Plan: BSW to perform a discipline closure on today's date due to an inability to maintain contact with the patient. BSW to notify the patients primary physician and [facility] of today's closure.  Sharon NgoKendra Nanetta York, BSW, CDP [facility] Social Worker [phone]

## 2018-04-21 NOTE — Progress Notes (Signed)
Date:  04/21/2018   Name:  Sharon York   DOB:  May 23, 1945   MRN:  [id number]   Chief Complaint: Urinary Frequency (Urinary Frequency thoughout the night and day. No burning or pain. Started 2 days ago.)  Urinary Frequency   This is a new problem. The current episode started in the past 7 days. The problem occurs every urination. The problem has been unchanged. The patient is experiencing no pain. There has been no fever. Associated symptoms include frequency. Pertinent negatives include no chills or sweats.  Diabetes  She presents for her follow-up diabetic visit. She has type 2 diabetes mellitus. Pertinent negatives for hypoglycemia include no dizziness, headaches, sweats or tremors. Associated symptoms include polyuria. Pertinent negatives for diabetes include no chest pain and no fatigue. Weakness: stable left sided hemiparesis  There are no diabetic complications. Current diabetic treatment includes oral agent (monotherapy). She is compliant with treatment all of the time. Her weight is stable. She is following a generally healthy (but recently drinking sweet tea and eating desserts for the holiday) diet. Her home blood glucose trend is fluctuating minimally.    Review of Systems  Constitutional: Negative for chills, fatigue and fever.  HENT: Negative for trouble swallowing.   Eyes: Negative for visual disturbance.  Respiratory: Negative for chest tightness and shortness of breath.   Cardiovascular: Negative for chest pain, palpitations and leg swelling.  Endocrine: Positive for polyuria.  Genitourinary: Positive for frequency.  Neurological: Negative for dizziness, tremors and headaches. Weakness: stable left sided hemiparesis   Hematological: Negative for adenopathy.    Patient Active Problem List   Diagnosis Date Noted  . Right shoulder pain 03/31/2018  . PMB (postmenopausal bleeding) 11/01/2017  . Hyperlipidemia associated with type 2 diabetes mellitus ([place]) 06/12/2017  .  DM (diabetes mellitus), type 2, uncontrolled ([place]) 11/06/2016  . Tinea corporis 03/17/2015  . Nocturnal cough 03/17/2015  . Abnormal gait 12/15/2014  . Altered bowel function 12/15/2014  . Blood pressure elevated without history of HTN 12/15/2014  . Hemiparesis affecting left side as late effect of cerebrovascular accident (CVA) ([place]) 12/15/2014  . Absence of bladder continence 12/15/2014  . Obstructive sleep apnea of adult 12/15/2014  . Arthritis of shoulder region, degenerative 12/15/2014  . OP (osteoporosis) 12/15/2014  . Allergic rhinitis, seasonal 12/15/2014  . Mixed incontinence 03/11/2013    Allergies  Allergen Reactions  . Nitrofurantoin Nausea Only  . Iodinated Diagnostic Agents Hives and Cough    Patient developed a hive on her left lip after injection as well as some coughing.     Past Surgical History:  Procedure Laterality Date  . CESAREAN SECTION  1975  . COLONOSCOPY  2010   normal    Social History   Tobacco Use  . Smoking status: Never Smoker  . Smokeless tobacco: Never Used  . Tobacco comment: smoking cessation materials not required  Substance Use Topics  . Alcohol use: No    Alcohol/week: 0.0 standard drinks  . Drug use: Never     Medication list has been reviewed and updated.  Current Meds  Medication Sig  . ACCU-CHEK AVIVA PLUS test strip  USE 1 TEST STRIP UP TO 4 TIMES DAILY  . ACCU-CHEK FASTCLIX LANCETS MISC 1 each by Does not apply route 2 (two) times daily.  Marland Kitchen alendronate (FOSAMAX) 70 MG tablet TAKE 1 TABLET BY MOUTH ONCE A WEEK TAKE  WITH  A  FULL  GLASS  OF  WATER  ON  AN  EMPTY  STOMACH  . aspirin 81 MG chewable tablet Chew 1 tablet by mouth daily.  Marland Kitchen atorvastatin (LIPITOR) 10 MG tablet Take 1 tablet (10 mg total) by mouth daily.  . baclofen (LIORESAL) 10 MG tablet Take 1 tablet (10 mg total) by mouth 2 (two) times daily.  . blood glucose meter kit and supplies KIT Dispense based on patient and insurance preference. Use up to four times  daily as directed. (FOR ICD-9 250.00, 250.01).  . Calcium Carbonate-Vitamin D (CALCIUM 500 + D) 500-125 MG-UNIT TABS Take by mouth.  . metFORMIN (GLUCOPHAGE-XR) 500 MG 24 hr tablet Take 1 tablet (500 mg total) by mouth daily with breakfast.  . Multiple Vitamins-Minerals (MULTIVITAMIN ADULT PO) Take by mouth.  . triamcinolone cream (KENALOG) 0.1 % Apply 1 application topically 2 (two) times daily.    PHQ 2/9 Scores 03/31/2018 03/11/2018 01/08/2018 11/01/2017  PHQ - 2 Score 0 0 0 0  PHQ- 9 Score - - 0 -    Physical Exam  Constitutional: She is oriented to person, place, and time. She appears well-developed. No distress.  HENT:  Head: Normocephalic and atraumatic.  Neck: Normal range of motion. Neck supple.  Cardiovascular: Normal rate, regular rhythm and normal heart sounds.  Pulmonary/Chest: Effort normal and breath sounds normal. No respiratory distress.  Abdominal: Soft. There is no tenderness. There is no guarding.  Musculoskeletal: She exhibits no edema.  Neurological: She is alert and oriented to person, place, and time. She displays atrophy (left sided weakness).  Skin: Skin is warm and dry. No rash noted.  Psychiatric: She has a normal mood and affect. Her behavior is normal. Thought content normal.  Nursing note and vitals reviewed.   BP 124/76 (BP Location: Left Arm, Patient Position: Sitting, Cuff Size: Normal)   Pulse 71   Ht '4\' 8"'  (1.422 m)   SpO2 (!) 71%   BMI 35.20 kg/m   Assessment and Plan: 1. Type 2 diabetes mellitus with other specified complication, without long-term current use of insulin (HCC) Continue current medications Consider adding ACEI - Comprehensive metabolic panel - Hemoglobin A1c  2. Urinary frequency No evidence of infection; may be due to increased sugar intake - CBC with Differential/Platelet - POCT urinalysis dipstick - negative   Partially dictated using Editor, commissioning. Any errors are unintentional.  Halina Maidens, MD [facility]  04/21/2018

## 2018-04-22 LAB — CBC WITH DIFFERENTIAL/PLATELET
Basophils Absolute: 0 10*3/uL (ref 0.0–0.2)
Basos: 0 %
EOS (ABSOLUTE): 0.1 10*3/uL (ref 0.0–0.4)
Eos: 1 %
Hematocrit: 39.6 % (ref 34.0–46.6)
Hemoglobin: 12.6 g/dL (ref 11.1–15.9)
Immature Grans (Abs): 0 10*3/uL (ref 0.0–0.1)
Immature Granulocytes: 0 %
Lymphocytes Absolute: 2.6 10*3/uL (ref 0.7–3.1)
Lymphs: 34 %
MCH: 23.7 pg — ABNORMAL LOW (ref 26.6–33.0)
MCHC: 31.8 g/dL (ref 31.5–35.7)
MCV: 75 fL — ABNORMAL LOW (ref 79–97)
Monocytes Absolute: 0.7 10*3/uL (ref 0.1–0.9)
Monocytes: 9 %
Neutrophils Absolute: 4.2 10*3/uL (ref 1.4–7.0)
Neutrophils: 56 %
Platelets: 307 10*3/uL (ref 150–450)
RBC: 5.31 x10E6/uL — ABNORMAL HIGH (ref 3.77–5.28)
RDW: 16.7 % — ABNORMAL HIGH (ref 12.3–15.4)
WBC: 7.7 10*3/uL (ref 3.4–10.8)

## 2018-04-22 LAB — COMPREHENSIVE METABOLIC PANEL
ALT: 20 IU/L (ref 0–32)
AST: 15 IU/L (ref 0–40)
Albumin/Globulin Ratio: 1.4 (ref 1.2–2.2)
Albumin: 4.2 g/dL (ref 3.5–4.8)
Alkaline Phosphatase: 53 IU/L (ref 39–117)
BUN/Creatinine Ratio: 18 (ref 12–28)
BUN: 14 mg/dL (ref 8–27)
Bilirubin Total: 0.3 mg/dL (ref 0.0–1.2)
CO2: 27 mmol/L (ref 20–29)
Calcium: 9.8 mg/dL (ref 8.7–10.3)
Chloride: 99 mmol/L (ref 96–106)
Creatinine, Ser: 0.77 mg/dL (ref 0.57–1.00)
GFR calc Af Amer: 88 mL/min/{1.73_m2} (ref >59)
GFR calc non Af Amer: 76 mL/min/{1.73_m2} (ref >59)
Globulin, Total: 3 g/dL (ref 1.5–4.5)
Glucose: 61 mg/dL — ABNORMAL LOW (ref 65–99)
Potassium: 4.4 mmol/L (ref 3.5–5.2)
Sodium: 142 mmol/L (ref 134–144)
Total Protein: 7.2 g/dL (ref 6.0–8.5)

## 2018-04-22 LAB — HEMOGLOBIN A1C
Est. average glucose Bld gHb Est-mCnc: 151 mg/dL
Hgb A1c MFr Bld: 6.9 % — ABNORMAL HIGH (ref 4.8–5.6)

## 2018-05-09 ENCOUNTER — Other Ambulatory Visit: Payer: Self-pay | Admitting: *Deleted

## 2018-05-09 NOTE — Patient Outreach (Signed)
**Note Sharon-Identified via Obfuscation** [facility] Care Management  05/09/2018  Sharon York 05/04/1946 [id number]   RN Health Coach Quarterly Outreach   Outreach Attempt:  Outreach attempt #1 to patient for follow up assessment. No answer. RN Health Coach left HIPAA compliant voicemail message along with contact information.  Plan:  RN Health Coach will schedule another outreach attempt within the month of January.   Sharon LernerFarrah Uriah Trueba RN [facility] Care Management  RN Health Coach [phone] [email or web address]

## 2018-05-16 ENCOUNTER — Ambulatory Visit: Payer: Medicare Other | Admitting: Internal Medicine

## 2018-05-30 ENCOUNTER — Encounter: Payer: Self-pay | Admitting: Internal Medicine

## 2018-05-30 ENCOUNTER — Ambulatory Visit (INDEPENDENT_AMBULATORY_CARE_PROVIDER_SITE_OTHER): Payer: Medicare Other | Admitting: Internal Medicine

## 2018-05-30 VITALS — BP 138/86 | HR 81 | Ht <= 58 in | Wt 158.6 lb

## 2018-05-30 DIAGNOSIS — IMO0001 Reserved for inherently not codable concepts without codable children: Secondary | ICD-10-CM

## 2018-05-30 DIAGNOSIS — I69354 Hemiplegia and hemiparesis following cerebral infarction affecting left non-dominant side: Secondary | ICD-10-CM

## 2018-05-30 DIAGNOSIS — S00412A Abrasion of left ear, initial encounter: Secondary | ICD-10-CM | POA: Diagnosis not present

## 2018-05-30 DIAGNOSIS — E1165 Type 2 diabetes mellitus with hyperglycemia: Secondary | ICD-10-CM | POA: Diagnosis not present

## 2018-05-30 DIAGNOSIS — E1169 Type 2 diabetes mellitus with other specified complication: Secondary | ICD-10-CM | POA: Diagnosis not present

## 2018-05-30 DIAGNOSIS — E785 Hyperlipidemia, unspecified: Secondary | ICD-10-CM

## 2018-05-30 MED ORDER — METFORMIN HCL ER 500 MG PO TB24: 500.0000 mg | ORAL_TABLET | Freq: Every day | ORAL | 1 refills | Status: DC

## 2018-05-30 MED ORDER — ATORVASTATIN CALCIUM 10 MG PO TABS: 10.0000 mg | ORAL_TABLET | Freq: Every day | ORAL | 3 refills | Status: DC

## 2018-05-30 NOTE — Patient Instructions (Signed)
Neosporin or similar antibiotic ointment - apply to area three times a day until healed

## 2018-05-30 NOTE — Progress Notes (Addendum)
Date:  05/30/2018   Name:  Sharon York   DOB:  1945/11/25   MRN:  [id number]   Chief Complaint: Ear Drainage (Left Ear- started Yesterday. Drainage from on side. )  Ear Drainage   There is pain in the left ear. This is a new problem. The current episode started yesterday. The problem has been unchanged. There has been no fever. Pertinent negatives include no headaches or sore throat.   S/p CVA with left sided hemi-paresis - she is interested in getting a light weight scooter called a Geo-Cruiser.    Review of Systems  Constitutional: Negative for chills, fatigue and fever.  HENT: Negative for ear pain, sinus pressure and sore throat.   Respiratory: Negative for chest tightness and shortness of breath.   Cardiovascular: Negative for chest pain.  Neurological: Positive for weakness. Negative for dizziness and headaches.    Patient Active Problem List   Diagnosis Date Noted  . Type 2 diabetes mellitus with other specified complication ([place]) 33/82/5053  . Right shoulder pain 03/31/2018  . PMB (postmenopausal bleeding) 11/01/2017  . Hyperlipidemia associated with type 2 diabetes mellitus ([place]) 06/12/2017  . Tinea corporis 03/17/2015  . Abnormal gait 12/15/2014  . Altered bowel function 12/15/2014  . Hemiparesis affecting left side as late effect of cerebrovascular accident (CVA) ([place]) 12/15/2014  . Obstructive sleep apnea of adult 12/15/2014  . Arthritis of shoulder region, degenerative 12/15/2014  . OP (osteoporosis) 12/15/2014  . Allergic rhinitis, seasonal 12/15/2014  . Mixed incontinence 03/11/2013    Allergies  Allergen Reactions  . Nitrofurantoin Nausea Only  . Iodinated Diagnostic Agents Hives and Cough    Patient developed a hive on her left lip after injection as well as some coughing.     Past Surgical History:  Procedure Laterality Date  . CESAREAN SECTION  1975  . COLONOSCOPY  2010   normal    Social History   Tobacco Use  . Smoking status: Never  Smoker  . Smokeless tobacco: Never Used  . Tobacco comment: smoking cessation materials not required  Substance Use Topics  . Alcohol use: No    Alcohol/week: 0.0 standard drinks  . Drug use: Never     Medication list has been reviewed and updated.  Current Meds  Medication Sig  . ACCU-CHEK AVIVA PLUS test strip  USE 1 TEST STRIP UP TO 4 TIMES DAILY  . ACCU-CHEK FASTCLIX LANCETS MISC 1 each by Does not apply route 2 (two) times daily.  Marland Kitchen alendronate (FOSAMAX) 70 MG tablet TAKE 1 TABLET BY MOUTH ONCE A WEEK TAKE  WITH  A  FULL  GLASS  OF  WATER  ON  AN  EMPTY  STOMACH  . aspirin 81 MG chewable tablet Chew 1 tablet by mouth daily.  Marland Kitchen atorvastatin (LIPITOR) 10 MG tablet Take 1 tablet (10 mg total) by mouth daily.  . baclofen (LIORESAL) 10 MG tablet Take 1 tablet (10 mg total) by mouth 2 (two) times daily.  . blood glucose meter kit and supplies KIT Dispense based on patient and insurance preference. Use up to four times daily as directed. (FOR ICD-9 250.00, 250.01).  . Calcium Carbonate-Vitamin D (CALCIUM 500 + D) 500-125 MG-UNIT TABS Take by mouth.  . metFORMIN (GLUCOPHAGE-XR) 500 MG 24 hr tablet Take 1 tablet (500 mg total) by mouth daily with breakfast.  . Multiple Vitamins-Minerals (MULTIVITAMIN ADULT PO) Take by mouth.  . triamcinolone cream (KENALOG) 0.1 % Apply 1 application topically 2 (two) times daily.  PHQ 2/9 Scores 03/31/2018 03/11/2018 01/08/2018 11/01/2017  PHQ - 2 Score 0 0 0 0  PHQ- 9 Score - - 0 -    Physical Exam Vitals signs and nursing note reviewed.  Constitutional:      General: She is not in acute distress.    Appearance: She is well-developed.  HENT:     Head: Normocephalic and atraumatic.     Right Ear: Tympanic membrane and ear canal normal.     Left Ear: Tympanic membrane and ear canal normal.     Ears:   Neck:     Musculoskeletal: Normal range of motion.  Cardiovascular:     Rate and Rhythm: Normal rate and regular rhythm.  Pulmonary:      Effort: Pulmonary effort is normal. No respiratory distress.     Breath sounds: Normal breath sounds.  Musculoskeletal:     Left shoulder: She exhibits normal range of motion.     Right lower leg: No edema.     Left lower leg: No edema.  Lymphadenopathy:     Cervical: No cervical adenopathy.  Skin:    General: Skin is warm and dry.     Findings: No rash.  Neurological:     Mental Status: She is alert and oriented to person, place, and time.     Motor: Weakness present.     Gait: Gait abnormal.  Psychiatric:        Behavior: Behavior normal.        Thought Content: Thought content normal.     BP 138/86 (BP Location: Right Arm, Patient Position: Sitting, Cuff Size: Normal)   Pulse 81   Ht '4\' 8"'  (1.422 m)   Wt 158 lb 9.6 oz (71.9 kg)   SpO2 98%   BMI 35.56 kg/m   Assessment and Plan: 1. Abrasion of ear region, left, initial encounter Apply TAO ointment tid until healed  2. Hyperlipidemia associated with type 2 diabetes mellitus (HCC) - atorvastatin (LIPITOR) 10 MG tablet; Take 1 tablet (10 mg total) by mouth daily.  Dispense: 90 tablet; Refill: 3  3. Uncontrolled type 2 diabetes mellitus without complication, without long-term current use of insulin (HCC) - metFORMIN (GLUCOPHAGE-XR) 500 MG 24 hr tablet; Take 1 tablet (500 mg total) by mouth daily with breakfast.  Dispense: 90 tablet; Refill: 1  4. Hemiparesis affecting left side as late effect of cerebrovascular accident (CVA) ([place]) Would likely qualify for the scooter - she will have them send me paper work to see what is needed   Partially dictated using Editor, commissioning. Any errors are unintentional.  Halina Maidens, MD [facility]  05/30/2018

## 2018-06-03 ENCOUNTER — Telehealth: Payer: Self-pay

## 2018-06-03 ENCOUNTER — Other Ambulatory Visit: Payer: Self-pay

## 2018-06-03 DIAGNOSIS — E1165 Type 2 diabetes mellitus with hyperglycemia: Principal | ICD-10-CM

## 2018-06-03 DIAGNOSIS — IMO0001 Reserved for inherently not codable concepts without codable children: Secondary | ICD-10-CM

## 2018-06-03 MED ORDER — GLUCOSE BLOOD VI STRP: ORAL_STRIP | 12 refills | Status: DC

## 2018-06-03 MED ORDER — LANCETS MISC: 12 refills | Status: DC

## 2018-06-03 MED ORDER — BLOOD GLUCOSE MONITOR KIT: PACK | 0 refills | Status: DC

## 2018-06-03 NOTE — Telephone Encounter (Signed)
Patient called for meter since insurance will cover. I sent new Rx for Generic Meter and Strips and Lancets to check 3 times daily. Also advised her to call insurance and ask what med supply can she use that will be covered to order wheelchair. She then can have the supply place fax [place] order to sign with all wheelchair info on order. I did remind that some insurances require a sit down face to face appt.and if so we will let her know or they will.

## 2018-06-06 ENCOUNTER — Ambulatory Visit (INDEPENDENT_AMBULATORY_CARE_PROVIDER_SITE_OTHER): Payer: Medicare Other | Admitting: Internal Medicine

## 2018-06-06 ENCOUNTER — Other Ambulatory Visit: Payer: Self-pay | Admitting: *Deleted

## 2018-06-06 ENCOUNTER — Encounter: Payer: Self-pay | Admitting: Internal Medicine

## 2018-06-06 VITALS — BP 134/80 | HR 88 | Ht <= 58 in | Wt 158.0 lb

## 2018-06-06 DIAGNOSIS — E1165 Type 2 diabetes mellitus with hyperglycemia: Secondary | ICD-10-CM

## 2018-06-06 DIAGNOSIS — I69354 Hemiplegia and hemiparesis following cerebral infarction affecting left non-dominant side: Secondary | ICD-10-CM

## 2018-06-06 DIAGNOSIS — M19011 Primary osteoarthritis, right shoulder: Secondary | ICD-10-CM | POA: Diagnosis not present

## 2018-06-06 DIAGNOSIS — M19012 Primary osteoarthritis, left shoulder: Secondary | ICD-10-CM | POA: Diagnosis not present

## 2018-06-06 DIAGNOSIS — IMO0001 Reserved for inherently not codable concepts without codable children: Secondary | ICD-10-CM

## 2018-06-06 DIAGNOSIS — R269 Unspecified abnormalities of gait and mobility: Secondary | ICD-10-CM | POA: Diagnosis not present

## 2018-06-06 NOTE — Progress Notes (Addendum)
Date:  06/06/2018   Name:  Sharon York   DOB:  26-Aug-1945   MRN:  [id number]   Chief Complaint: Lorelei Pont TO FACE (Discuss wheelchair) and Diabetes (Needed urine last OV and could not urinate so she did today for the lab. MICro) Patient presents for a face to face exam for mobility equipment. On review, her weight is stable.  She does not have any pulmonary disease or hx of skin breakdown.   Since her stroke she has used a cane or walker for ambulation but is slowing down and have more issues maintaining her balance while walking.  She denies any recent falls or injury. Her gait is very slow and unsteady - she is able to ambulate about 100 ft before needing to rest. She needs a PWC due to progressive weakness, slowing of already unsteady gait, and desire to maintain independent living. She has difficulty standing to prepare meals due to left sided paresis and left LE weakness.   She is using a cane with her right hand but has trouble with a walker due to complete LUE paresis.   A PWC would allow her to be independent with shopping and attending appointments in the community.   She has fairly good postural stability.  She has used a PWC with a joystick in the past with no trouble.  She will be able to transfer to/from a PWC safely.  She is expected to be able to transfer in 1-2 minutes to/from the [facility]. She is alert and oriented x 4 and will be able to safely use a PWC in the home and in the community.  She is willing and able to learn to operate a new device.  MMRADLs: She has trouble getting to the kitchen to prepare meals/cook using her cane due to balance issues and risk for falls.   Manual WC - she is unable to use a manual wheelchair due to dense left upper extremity paresis - unable to use left arm at all.  She can not use a scooter because she will not be able to use it in the kitchen to cook/prepare meals and she can not use her left arm.  Review of Systems  Constitutional: Negative for  chills, fatigue and fever.  HENT: Negative for trouble swallowing.   Eyes: Negative for visual disturbance.  Respiratory: Negative for cough, chest tightness, shortness of breath and wheezing.   Cardiovascular: Negative for chest pain, palpitations and leg swelling.  Musculoskeletal: Positive for arthralgias and gait problem.  Skin: Negative for color change and rash.  Neurological: Positive for weakness. Negative for dizziness, light-headedness and headaches.  Psychiatric/Behavioral: Negative for behavioral problems, decreased concentration and sleep disturbance.    Patient Active Problem List   Diagnosis Date Noted  . Type 2 diabetes mellitus with other specified complication ([place]) 40/98/1191  . Right shoulder pain 03/31/2018  . PMB (postmenopausal bleeding) 11/01/2017  . Hyperlipidemia associated with type 2 diabetes mellitus ([place]) 06/12/2017  . Tinea corporis 03/17/2015  . Abnormal gait 12/15/2014  . Altered bowel function 12/15/2014  . Hemiparesis affecting left side as late effect of cerebrovascular accident (CVA) ([place]) 12/15/2014  . Obstructive sleep apnea of adult 12/15/2014  . Arthritis of shoulder region, degenerative 12/15/2014  . OP (osteoporosis) 12/15/2014  . Allergic rhinitis, seasonal 12/15/2014  . Mixed incontinence 03/11/2013    Allergies  Allergen Reactions  . Nitrofurantoin Nausea Only  . Iodinated Diagnostic Agents Hives and Cough    Patient developed a hive on  her left lip after injection as well as some coughing.     Past Surgical History:  Procedure Laterality Date  . CESAREAN SECTION  1975  . COLONOSCOPY  2010   normal    Social History   Tobacco Use  . Smoking status: Never Smoker  . Smokeless tobacco: Never Used  . Tobacco comment: smoking cessation materials not required  Substance Use Topics  . Alcohol use: No    Alcohol/week: 0.0 standard drinks  . Drug use: Never     Medication list has been reviewed and updated.  Current Meds    Medication Sig  . ACCU-CHEK AVIVA PLUS test strip  USE 1 TEST STRIP UP TO 4 TIMES DAILY  . ACCU-CHEK FASTCLIX LANCETS MISC 1 each by Does not apply route 2 (two) times daily.  Marland Kitchen alendronate (FOSAMAX) 70 MG tablet TAKE 1 TABLET BY MOUTH ONCE A WEEK TAKE  WITH  A  FULL  GLASS  OF  WATER  ON  AN  EMPTY  STOMACH  . aspirin 81 MG chewable tablet Chew 1 tablet by mouth daily.  Marland Kitchen atorvastatin (LIPITOR) 10 MG tablet Take 1 tablet (10 mg total) by mouth daily.  . baclofen (LIORESAL) 10 MG tablet Take 1 tablet (10 mg total) by mouth 2 (two) times daily.  . blood glucose meter kit and supplies KIT Dispense based on patient and insurance preference. Use up to 3 times daily as directed to check Blood Sugar for ICD10: E11.69 Uncontrolled DM2  . Calcium Carbonate-Vitamin D (CALCIUM 500 + D) 500-125 MG-UNIT TABS Take by mouth.  Marland Kitchen glucose blood test strip Dispense Strips compatable to insurance and meter covered in order to check BS up to 3 times daily  for ICD 10 E11.69  . Lancets MISC Use brand compatible to insurance and meter... to check BS up to 3 times daily for ICD10  E11.69.  . metFORMIN (GLUCOPHAGE-XR) 500 MG 24 hr tablet Take 1 tablet (500 mg total) by mouth daily with breakfast.  . Multiple Vitamins-Minerals (MULTIVITAMIN ADULT PO) Take by mouth.  . triamcinolone cream (KENALOG) 0.1 % Apply 1 application topically 2 (two) times daily.    PHQ 2/9 Scores 06/06/2018 03/31/2018 03/11/2018 01/08/2018  PHQ - 2 Score 0 0 0 0  PHQ- 9 Score - - - 0   Wt Readings from Last 3 Encounters:  06/06/18 158 lb (71.7 kg)  05/30/18 158 lb 9.6 oz (71.9 kg)  03/31/18 157 lb (71.2 kg)    Physical Exam Vitals signs and nursing note reviewed.  Constitutional:      General: She is not in acute distress.    Appearance: She is well-developed.  HENT:     Head: Normocephalic and atraumatic.  Neck:     Musculoskeletal: Normal range of motion and neck supple.  Cardiovascular:     Rate and Rhythm: Normal rate and  regular rhythm.  Pulmonary:     Effort: Pulmonary effort is normal. No respiratory distress.     Breath sounds: Normal breath sounds.  Musculoskeletal:     Right shoulder: Normal.     Left shoulder: She exhibits decreased range of motion (unable to move shoulder joint - ) and decreased strength (dense paresis).     Left elbow: She exhibits deformity (frozen with very limited rom).     Left ankle: She exhibits decreased range of motion (foot drop ).     Comments: 0/5 LUE 5/5 RUE 3+/5 LLE 5/5 RLE   Skin:    General: Skin  is warm and dry.     Findings: No rash.  Neurological:     Mental Status: She is alert and oriented to person, place, and time.     Motor: Weakness, atrophy (LUE ) and abnormal muscle tone present. No tremor.     Gait: Gait abnormal and tandem walk abnormal (unable to tandem walk, unable to stand on toe or heels).     Deep Tendon Reflexes:     Reflex Scores:      Bicep reflexes are 1+ on the right side and 0 on the left side.      Patellar reflexes are 1+ on the right side and 0 on the left side.    Comments: Dense LUE paresis  LLE weakness - foot drop noted, strength 3+/5 proximal and distal muscles  Gait is slow, left LE drags with ambulation  Psychiatric:        Attention and Perception: Attention normal.        Mood and Affect: Mood and affect normal.        Behavior: Behavior normal.        Thought Content: Thought content normal.        Cognition and Memory: Cognition normal.        Judgment: Judgment normal.     BP 134/80   Pulse 88   Ht '4\' 8"'  (1.422 m)   Wt 158 lb (71.7 kg)   SpO2 97%   BMI 35.42 kg/m   Assessment and Plan: 1. Hemiparesis affecting left side as late effect of cerebrovascular accident (CVA) ([place]) Patient qualifies for [place], forms completed She is mentally and physically able to operate a [place] safely and is willing to learn appropriate use of such **She also needs a Marketing executive and mounting platform to carry the [place] with  her**  2. Primary osteoarthritis of both shoulders Continue otc anti-inflammatories as needed  3. Abnormal gait At risk for falls, MRADLs limited  4. Uncontrolled type 2 diabetes mellitus without complication, without long-term current use of insulin (HCC) - Urine Microalbumin w/creat. ratio   Partially dictated using Editor, commissioning. Any errors are unintentional.  Halina Maidens, MD [facility]  06/09/2018

## 2018-06-06 NOTE — Patient Outreach (Signed)
[facility]) Care Management  06/06/2018   Sharon York [birth year] [id number]  RN Health Coach telephone call to patient.  Hipaa compliance verified. Per patient she is doing good. Patient stated her ear is doing better. Patient fasting blood sugar is 158. Patient stated she is having a hard time checking her blood sugars.  Per patient due to her hemiparesis and weakness in her hand she is mainly able to check her blood sugars in her thumb.  Per patient she has calluses on her thumb. It has become very hard for her to get a drop of blood. Patient is on oral agents and checks her blood sugars 2- 3 times a day. Patien is also having some problems with mobility and going out. Patient is requesting a scooter. Patient has agreed to follow up outreach calls.   Current Medications:  Current Outpatient Medications  Medication Sig Dispense Refill  . ACCU-CHEK AVIVA PLUS test strip  USE 1 TEST STRIP UP TO 4 TIMES DAILY 400 each 3  . ACCU-CHEK FASTCLIX LANCETS MISC 1 each by Does not apply route 2 (two) times daily. 100 each 3  . alendronate (FOSAMAX) 70 MG tablet TAKE 1 TABLET BY MOUTH ONCE A WEEK TAKE  WITH  A  FULL  GLASS  OF  WATER  ON  AN  EMPTY  STOMACH 12 tablet 4  . aspirin 81 MG chewable tablet Chew 1 tablet by mouth daily.    Marland Kitchen atorvastatin (LIPITOR) 10 MG tablet Take 1 tablet (10 mg total) by mouth daily. 90 tablet 3  . baclofen (LIORESAL) 10 MG tablet Take 1 tablet (10 mg total) by mouth 2 (two) times daily. 60 tablet 12  . blood glucose meter kit and supplies KIT Dispense based on patient and insurance preference. Use up to 3 times daily as directed to check Blood Sugar for ICD10: E11.69 Uncontrolled DM2 1 each 0  . Calcium Carbonate-Vitamin D (CALCIUM 500 + D) 500-125 MG-UNIT TABS Take by mouth.    Marland Kitchen glucose blood test strip Dispense Strips compatable to insurance and meter covered in order to check BS up to 3 times daily  for ICD 10 E11.69 100 each 12  . Lancets MISC Use  brand compatible to insurance and meter... to check BS up to 3 times daily for ICD10  E11.69. 100 each 12  . metFORMIN (GLUCOPHAGE-XR) 500 MG 24 hr tablet Take 1 tablet (500 mg total) by mouth daily with breakfast. 90 tablet 1  . Multiple Vitamins-Minerals (MULTIVITAMIN ADULT PO) Take by mouth.    . triamcinolone cream (KENALOG) 0.1 % Apply 1 application topically 2 (two) times daily. 30 g 0   No current facility-administered medications for this visit.     Functional Status:  In your present state of health, do you have any difficulty performing the following activities: 03/11/2018 01/08/2018  Hearing? Y N  Comment - R hearing aid  Vision? N N  Comment - wears eyeglasses  Difficulty concentrating or making decisions? N N  Walking or climbing stairs? Y Y  Comment - avoids stairs  Dressing or bathing? N Y  Comment - needs assistance  Doing errands, shopping? Y N  Preparing Food and eating ? N N  Comment - denies dentures  Using the Toilet? N N  In the past six months, have you accidently leaked urine? Y N  Do you have problems with loss of bowel control? N N  Managing your Medications? N N  Managing your Finances? N  N  Housekeeping or managing your Housekeeping? Y N  Some recent data might be hidden    Fall/Depression Screening: Fall Risk  06/06/2018 03/31/2018 03/11/2018  Falls in the past year? 0 0 No  Comment - - -  Number falls in past yr: - - -  Injury with Fall? - - -  Risk Factor Category  - - -  Risk for fall due to : Impaired balance/gait;Impaired mobility - Impaired balance/gait;Impaired mobility  Risk for fall due to: Comment - - -  Follow up Falls evaluation completed - -   PHQ 2/9 Scores 06/06/2018 03/31/2018 03/11/2018 01/08/2018 11/01/2017 08/15/2017 08/05/2017  PHQ - 2 Score 0 0 0 0 0 0 0  PHQ- 9 Score - - - 0 - - -    Assessment: A1C has increased to 6.9 Fasting blood sugar 158 Patient is having some problems checking blood sugars Patient is having some  problems with mobility outside of home   Plan:  Referred to pharmacy RN discussed healthy food choices RN discussed mobility issues RN will follow up within the month of February  Dotsie Gillette [place] [facility] [phone]

## 2018-06-07 LAB — MICROALBUMIN / CREATININE URINE RATIO
Creatinine, Urine: 64.3 mg/dL
Microalb/Creat Ratio: 13.1 mg/g creat (ref 0.0–30.0)
Microalbumin, Urine: 8.4 ug/mL

## 2018-06-09 ENCOUNTER — Other Ambulatory Visit: Payer: Self-pay | Admitting: Internal Medicine

## 2018-06-09 ENCOUNTER — Telehealth: Payer: Self-pay

## 2018-06-09 ENCOUNTER — Other Ambulatory Visit: Payer: Self-pay | Admitting: Pharmacist

## 2018-06-09 MED ORDER — FREESTYLE LIBRE READER DEVI: 1.0000 | Freq: Two times a day (BID) | 0 refills | Status: DC

## 2018-06-09 NOTE — Patient Outreach (Signed)
[name]) [name]   06/09/2018  Sharon York 1945/10/01 [id number]  Reason for referral: Medication Assistance with Continue Glucose Monitoring, Medication Management  Referral source: [name]  PMHx includes but not limited to:  Type 2 diabetes mellitus, CVA with residual left side hemiparesis, OSA, arthritis, OP, HLD  Per notes, patient having trouble checking blood sugar due to hemiparesis.    Outreach: Unsuccessful telephone call to patient.  HIPAA compliant voicemails left on both home and mobile numbers listed in EMR.   Care coordination call placed to patient's pharmacy, [name].  Per pharmacist, no current prescription on file for FreeStyle Libre CGM.  Cash price:  14 day CGM Reader (replace every 3 months) =$84, Sensor (replace every 14 days) = $65.32.  Usual criteria includes:  On insulin 3x daily, checking CBGs 4x daily.  Pharmacist unsure if patient may have PA approved if criteria not met.  He can check coverage if prescription sent in by provider.   Care coordination call to Dr. Army Melia to request CGM prescription be sent to pharmacy in order to get denial information and phone number to call to find out about PA.     Plan: Will f/u with pharmacy tomorrow  Ralene Bathe, PharmD, [place] [phone]

## 2018-06-09 NOTE — Telephone Encounter (Signed)
[facility] Pharmacist Haynes Hoehn called stating she received a referral for patient. Wanted to know you could send in the freestyle [place] monitor with the 14 day sensors for patient to see if insurance will cover this.  Wants E Script sent to [facility] for patient. Says patient is having difficulty pricking her finger with the regular monitors.  Please Advise.  CB# [phone]

## 2018-06-09 NOTE — Telephone Encounter (Signed)
Awaiting response from insurance on this. I did PA in 2018 and they denied for the patient to have this. I have never been able to get this approved. Patient was informed at visit Friday.  Thank you.

## 2018-06-09 NOTE — Telephone Encounter (Signed)
I sent in the Sensor only to see if it is covered.   I did this in November 2018 and it was not covered so I have little faith that it will be now.

## 2018-06-10 ENCOUNTER — Other Ambulatory Visit: Payer: Self-pay | Admitting: Pharmacist

## 2018-06-10 NOTE — Patient Outreach (Signed)
[facility]) [place]   06/10/2018  Sharon York 1945-11-26 [id number]   Reason for referral: Medication Assistance with Continue Glucose Monitoring, Medication Management  Referral source: [place]  PMHx includes but not limited to:  Type 2 diabetes mellitus, CVA with residual left side hemiparesis, OSA, arthritis, OP, HLD  Per notes, patient having trouble checking blood sugar due to hemiparesis.    Outreach:  Incoming call received from Ms. Noell this morning.  HIPAA identifiers verified.    Subjective:  Patient agreeable for me to f/u on Free Style Libre CGM and find out if any exceptions can be made for her to qualify.  Patient agreeable to review medications.    Objective: Lab Results  Component Value Date   CREATININE 0.77 04/21/2018   CREATININE 0.64 01/14/2018   CREATININE 0.70 07/19/2017    Lab Results  Component Value Date   HGBA1C 6.9 (H) 04/21/2018    Lipid Panel     Component Value Date/Time   CHOL 141 01/14/2018 0914   TRIG 92 01/14/2018 0914   HDL 61 01/14/2018 0914   CHOLHDL 2.3 01/14/2018 0914   LDLCALC 62 01/14/2018 0914    BP Readings from Last 3 Encounters:  06/06/18 134/80  05/30/18 138/86  04/21/18 124/76    Allergies  Allergen Reactions  . Nitrofurantoin Nausea Only  . Iodinated Diagnostic Agents Hives and Cough    Patient developed a hive on her left lip after injection as well as some coughing.     Medications Reviewed Today    Reviewed by Rudean Haskell, [facility] (Pharmacist) on 06/10/18 at 0855  Med List Status: <None>  Medication Order Taking? Sig Documenting Provider Last Dose Status Informant  ACCU-CHEK AVIVA PLUS test strip [id number] Yes  USE 1 TEST STRIP UP TO 4 TIMES DAILY [name], MD Taking Active   ACCU-CHEK FASTCLIX LANCETS [place] [id number] Yes 1 each by Does not apply route 2 (two) times daily. [name], MD  Taking Active   alendronate (FOSAMAX) 70 MG tablet [id number] Yes TAKE 1 TABLET BY MOUTH ONCE A WEEK TAKE  WITH  A  FULL  GLASS  OF  WATER  ON  AN  EMPTY  STOMACH [name], MD Taking Active   aspirin 81 MG chewable tablet [id number] Yes Chew 1 tablet by mouth daily. [provider] Taking Active            Med Note Army Melia, Jesse Sans   Wed Dec 15, 2014  4:30 PM) Received from: [place]: Chew by mouth.  atorvastatin (LIPITOR) 10 MG tablet [id number] Yes Take 1 tablet (10 mg total) by mouth daily. [name], MD Taking Active   baclofen (LIORESAL) 10 MG tablet [id number] Yes Take 1 tablet (10 mg total) by mouth 2 (two) times daily. [name], MD Taking Active   blood glucose meter kit and supplies KIT [id number] Yes Dispense based on patient and insurance preference. Use up to 3 times daily as directed to check Blood Sugar for ICD10: E11.69 Uncontrolled DM2 [name], MD Taking Active   Calcium Carbonate-Vitamin D (CALCIUM 500 + D) 500-125 MG-UNIT TABS [id number] Yes Take 1 tablet by mouth daily.  [provider] Taking Active            Med Note Quentin Cornwall, Deberah Castle   Fri Dec 17, 2014  8:58 AM) Received from: [facility]  Continuous Blood Gluc Receiver (FREESTYLE LIBRE READER) DEVI [id number]  1 each by Does not apply route 2 (two) times daily. [name], MD  Active   glucose blood test strip [id number] Yes Dispense Strips compatable to insurance and meter covered in order to check BS up to 3 times daily  for ICD 10 E11.69 [name], MD Taking Active   Lancets MISC [id number] Yes Use brand compatible to insurance and meter... to check BS up to 3 times daily for ICD10  E11.69. [name], MD Taking Active   metFORMIN (GLUCOPHAGE-XR) 500 MG 24 hr tablet [id number] Yes Take 1 tablet (500 mg total) by mouth daily with breakfast. [name], MD Taking Active   Multiple Vitamins-Minerals (MULTIVITAMIN  ADULT PO) [id number] Yes Take by mouth. [provider] Taking Active Self        Discontinued 06/10/18 [phone] (Completed Course)           Assessment:  Drugs sorted by system:  Cardiovascular: aspirin 11m, atorvastatin  Gastrointestinal: baclofen  Endocrine: metformin  Vitamins/Minerals/Supplements: calcium carbonate, MVI  Miscellaneous: Alendronate  Medication Review Findings:  . Baclofen: BID dosing however patient taking both tablets in the AM.  I reviewed correct administration and dosing with patient who voiced understanding.  She will start taking as prescribed and monitor for any changes in her symptoms.  . Having trouble checking CBGs due to residual hemiparesis from prior stroke.  Would like to use CGM if affordable through insurance.  Cash price too high.    [facility] for the [facility] Disease [facility] IncReader sent in from [place] yesterday.  Per staff, prescription rejected from Part D.  Phone number provided for rejection.    [facility]  Per representative, no PA or exceptions accepted.  [place] to [place].  Per representative, no PA or exceptions accepted.   Several calls placed to various pharmacies to find lowest price for sensors.   Most pharmacies range ~$65 - 79 per 1 sensor.  If patient has a smart phone, this can be used in place of the Reader.    Unsuccessful call back to patient to relay message.  I left a HIPAA compliant voicemail requesting a return call.   Plan: I will f/u back up with patient later this week.   CRalene Bathe PharmD, [phone]

## 2018-06-11 ENCOUNTER — Other Ambulatory Visit: Payer: Self-pay | Admitting: Internal Medicine

## 2018-06-11 DIAGNOSIS — E1165 Type 2 diabetes mellitus with hyperglycemia: Principal | ICD-10-CM

## 2018-06-11 DIAGNOSIS — IMO0001 Reserved for inherently not codable concepts without codable children: Secondary | ICD-10-CM

## 2018-06-11 MED ORDER — FREESTYLE LIBRE 14 DAY SENSOR MISC: 1.0000 | 5 refills | Status: DC | PRN

## 2018-06-12 ENCOUNTER — Other Ambulatory Visit: Payer: Self-pay | Admitting: Pharmacist

## 2018-06-12 NOTE — Patient Outreach (Signed)
[facility]) Care Management  [facility] 06/12/2018  Sharon York 1945-12-31 [id number]  Incoming call and voicemail received from patient on 06/11/2018.  Return call placed to patient today.    Ms. Voorhis reports she picked up the Free Style Libre reader from her pharmacy for $65 and now has another prescription ready for the sensors for $130.  She reports she will not be able to afford this monthly and is almost not sure if she can correctly use the meter due to her hemiparesis on left side.  She states she does have a smart phone that she can use as the Reader but would need her daughter to assist with setting it up.  She is planning on returning the reader for now.  If she is able to set up the reader on her smart phone and afford the sensors in the future, she will go back to order from her pharmacy.  She reports her daughter lives with her but works 2 jobs and often not home very often.  Patient voices understanding on why prescription cannot be covered under Medicare due to insulin and monitoring requirements.  She states she has been taking the baclofen as directed BID.  No further questions from patient at this time.  I provided my phone number to her if she needs to reach out to me in the future.   Plan: Will close [facility] pharmacy case at this time.   Thank you for allowing [facility] pharmacy to be involved in this patient's care.    Haynes Hoehn, PharmD, [facility] Clinical Pharmacist [facility] [employer] [phone]

## 2018-06-26 DIAGNOSIS — R269 Unspecified abnormalities of gait and mobility: Secondary | ICD-10-CM | POA: Diagnosis not present

## 2018-06-26 DIAGNOSIS — M19019 Primary osteoarthritis, unspecified shoulder: Secondary | ICD-10-CM | POA: Diagnosis not present

## 2018-06-26 DIAGNOSIS — I69354 Hemiplegia and hemiparesis following cerebral infarction affecting left non-dominant side: Secondary | ICD-10-CM | POA: Diagnosis not present

## 2018-06-26 DIAGNOSIS — E1165 Type 2 diabetes mellitus with hyperglycemia: Secondary | ICD-10-CM | POA: Diagnosis not present

## 2018-07-01 ENCOUNTER — Ambulatory Visit: Payer: Medicare Other | Admitting: *Deleted

## 2018-07-10 ENCOUNTER — Ambulatory Visit (INDEPENDENT_AMBULATORY_CARE_PROVIDER_SITE_OTHER): Payer: Medicare Other | Admitting: Internal Medicine

## 2018-07-10 ENCOUNTER — Encounter: Payer: Self-pay | Admitting: Internal Medicine

## 2018-07-10 ENCOUNTER — Other Ambulatory Visit: Payer: Self-pay

## 2018-07-10 VITALS — BP 104/68 | HR 77 | Ht <= 58 in | Wt 160.0 lb

## 2018-07-10 DIAGNOSIS — Z23 Encounter for immunization: Secondary | ICD-10-CM

## 2018-07-10 DIAGNOSIS — L03011 Cellulitis of right finger: Secondary | ICD-10-CM | POA: Diagnosis not present

## 2018-07-10 DIAGNOSIS — S61011A Laceration without foreign body of right thumb without damage to nail, initial encounter: Secondary | ICD-10-CM | POA: Diagnosis not present

## 2018-07-10 MED ORDER — CEPHALEXIN 500 MG PO CAPS: 500.0000 mg | ORAL_CAPSULE | Freq: Four times a day (QID) | ORAL | 0 refills | Status: AC

## 2018-07-10 NOTE — Progress Notes (Signed)
Date:  07/10/2018   Name:  Sharon York   DOB:  [birth year]   MRN:  [id number]   Chief Complaint: Hand Pain (Smasher her finger Right Thumb in a door Wednesday. Still slightly swollen around the knuckle. )  Hand Injury   The incident occurred 12 to 24 hours ago. The [facility] ([facility] Injury mechanism: pinched in door at public restroom. The pain is present in the right fingers (right thumb). The quality of the pain is described as burning.  The skin over the knuckle is torn and slightly painful but she can bend the thumb and use her hand normally.  Review of Systems  Constitutional: Negative for chills, fatigue and fever.  Skin: Positive for color change and wound.    Patient Active Problem List   Diagnosis Date Noted  . Type 2 diabetes mellitus with other specified complication ([place]) 05/13/8249  . Right shoulder pain 03/31/2018  . PMB (postmenopausal bleeding) 11/01/2017  . Hyperlipidemia associated with type 2 diabetes mellitus ([place]) 06/12/2017  . Tinea corporis 03/17/2015  . Abnormal gait 12/15/2014  . Altered bowel function 12/15/2014  . Hemiparesis affecting left side as late effect of cerebrovascular accident (CVA) ([place]) 12/15/2014  . Obstructive sleep apnea of adult 12/15/2014  . Arthritis of shoulder region, degenerative 12/15/2014  . OP (osteoporosis) 12/15/2014  . Allergic rhinitis, seasonal 12/15/2014  . Mixed incontinence 03/11/2013    Allergies  Allergen Reactions  . Nitrofurantoin Nausea Only  . Iodinated Diagnostic Agents Hives and Cough    Patient developed a hive on her left lip after injection as well as some coughing.     Past Surgical History:  Procedure Laterality Date  . CESAREAN SECTION  1975  . COLONOSCOPY  2010   normal    Social History   Tobacco Use  . Smoking status: Never Smoker  . Smokeless tobacco: Never Used  . Tobacco comment: smoking cessation materials not required  Substance Use Topics  .  Alcohol use: No    Alcohol/week: 0.0 standard drinks  . Drug use: Never     Medication list has been reviewed and updated.  Current Meds  Medication Sig  . ACCU-CHEK AVIVA PLUS test strip  USE 1 TEST STRIP UP TO 4 TIMES DAILY  . ACCU-CHEK FASTCLIX LANCETS MISC 1 each by Does not apply route 2 (two) times daily.  Marland Kitchen alendronate (FOSAMAX) 70 MG tablet TAKE 1 TABLET BY MOUTH ONCE A WEEK TAKE  WITH  A  FULL  GLASS  OF  WATER  ON  AN  EMPTY  STOMACH  . aspirin 81 MG chewable tablet Chew 1 tablet by mouth daily.  Marland Kitchen atorvastatin (LIPITOR) 10 MG tablet Take 1 tablet (10 mg total) by mouth daily.  . baclofen (LIORESAL) 10 MG tablet Take 1 tablet (10 mg total) by mouth 2 (two) times daily.  . blood glucose meter kit and supplies KIT Dispense based on patient and insurance preference. Use up to 3 times daily as directed to check Blood Sugar for ICD10: E11.69 Uncontrolled DM2  . Calcium Carbonate-Vitamin D (CALCIUM 500 + D) 500-125 MG-UNIT TABS Take 1 tablet by mouth daily.   . Continuous Blood Gluc Receiver (FREESTYLE LIBRE READER) DEVI 1 each by Does not apply route 2 (two) times daily.  . Continuous Blood Gluc Sensor (FREESTYLE LIBRE 14 DAY SENSOR) MISC Apply 1 each topically as needed.  Marland Kitchen glucose blood test strip Dispense Strips compatable to insurance and meter covered in  order to check BS up to 3 times daily  for ICD 10 E11.69  . Lancets MISC Use brand compatible to insurance and meter... to check BS up to 3 times daily for ICD10  E11.69.  . metFORMIN (GLUCOPHAGE-XR) 500 MG 24 hr tablet Take 1 tablet (500 mg total) by mouth daily with breakfast.  . Multiple Vitamins-Minerals (MULTIVITAMIN ADULT PO) Take by mouth.    PHQ 2/9 Scores 07/10/2018 06/06/2018 03/31/2018 03/11/2018  PHQ - 2 Score 0 0 0 0  PHQ- 9 Score - - - -    Physical Exam Vitals signs and nursing note reviewed.  Constitutional:      General: She is not in acute distress.    Appearance: She is well-developed.  HENT:     Head:  Normocephalic and atraumatic.  Pulmonary:     Effort: Pulmonary effort is normal. No respiratory distress.  Musculoskeletal: Normal range of motion.  Skin:    General: Skin is warm and dry.     Findings: No rash.     Comments: 0.5 cm superficial laceration of skin over the DIP joint of the right thumb.  Mild erythema and minimal tenderness.  Normal ROM, no deformity of the joint noted.  No nail involvement.  Neurological:     Mental Status: She is alert and oriented to person, place, and time.  Psychiatric:        Behavior: Behavior normal.        Thought Content: Thought content normal.     BP 104/68   Pulse 77   Ht '4\' 8"'  (1.422 m)   Wt 160 lb (72.6 kg)   SpO2 96%   BMI 35.87 kg/m   Assessment and Plan: 1. Cellulitis of finger of right hand Local care with TAO, begin antibiotics if more redness and swelling occur - cephALEXin (KEFLEX) 500 MG capsule; Take 1 capsule (500 mg total) by mouth 4 (four) times daily for 10 days.  Dispense: 40 capsule; Refill: 0  2. Laceration of right thumb without foreign body without damage to nail, initial encounter - Tdap vaccine greater than or equal to 7yo IM   Partially dictated using Editor, commissioning. Any errors are unintentional.  Halina Maidens, MD [facility]  07/10/2018

## 2018-07-10 NOTE — Patient Instructions (Signed)
Tdap Vaccine (Tetanus, Diphtheria and Pertussis): What You Need to Know  1. Why get vaccinated?  Tetanus, diphtheria and pertussis are very serious diseases. Tdap vaccine can protect us from these diseases. And, Tdap vaccine given to pregnant women can protect newborn babies against pertussis..  TETANUS (Lockjaw) is rare in the [place] today. It causes painful muscle tightening and stiffness, usually all over the body.  · It can lead to tightening of muscles in the head and neck so you can't open your mouth, swallow, or sometimes even breathe. Tetanus kills about 1 out of 10 people who are infected even after receiving the best medical care.  DIPHTHERIA is also rare in the [place] today. It can cause a thick coating to form in the back of the throat.  · It can lead to breathing problems, heart failure, paralysis, and death.  PERTUSSIS (Whooping Cough) causes severe coughing spells, which can cause difficulty breathing, vomiting and disturbed sleep.  · It can also lead to weight loss, incontinence, and rib fractures. Up to 2 in 100 adolescents and 5 in 100 adults with pertussis are hospitalized or have complications, which could include pneumonia or death.  These diseases are caused by bacteria. Diphtheria and pertussis are spread from person to person through secretions from coughing or sneezing. Tetanus enters the body through cuts, scratches, or wounds.  Before vaccines, as many as 200,000 cases of diphtheria, 200,000 cases of pertussis, and hundreds of cases of tetanus, were reported in the [place] each year. Since vaccination began, reports of cases for tetanus and diphtheria have dropped by about 99% and for pertussis by about 80%.  2. Tdap vaccine  Tdap vaccine can protect adolescents and adults from tetanus, diphtheria, and pertussis. One dose of Tdap is routinely given at age 11 or 12. People who did not get Tdap at that age should get it as soon as possible.  Tdap is especially important  for healthcare professionals and anyone having close contact with a baby younger than 12 months.  Pregnant women should get a dose of Tdap during every pregnancy, to protect the newborn from pertussis. Infants are most at risk for severe, life-threatening complications from pertussis.  Another vaccine, called Td, protects against tetanus and diphtheria, but not pertussis. A Td booster should be given every 10 years. Tdap may be given as one of these boosters if you have never gotten Tdap before. Tdap may also be given after a severe cut or burn to prevent tetanus infection.  Your doctor or the person giving you the vaccine can give you more information.  Tdap may safely be given at the same time as other vaccines.  3. Some people should not get this vaccine  · A person who has ever had a life-threatening allergic reaction after a previous dose of any diphtheria, tetanus or pertussis containing vaccine, OR has a severe allergy to any part of this vaccine, should not get Tdap vaccine. Tell the person giving the vaccine about any severe allergies.  · Anyone who had coma or long repeated seizures within 7 days after a childhood dose of DTP or DTaP, or a previous dose of Tdap, should not get Tdap, unless a cause other than the vaccine was found. They can still get Td.  · Talk to your doctor if you:  ? have seizures or another nervous system problem,  ? had severe pain or swelling after any vaccine containing diphtheria, tetanus or pertussis,  ? ever had a condition   called Guillain-Barré Syndrome (GBS),  ? aren't feeling well on the day the shot is scheduled.  4. Risks  With any medicine, including vaccines, there is a chance of side effects. These are usually mild and go away on their own. Serious reactions are also possible but are rare.  Most people who get Tdap vaccine do not have any problems with it.  Mild problems following Tdap  (Did not interfere with activities)  · Pain where the shot was given (about 3 in 4  adolescents or 2 in 3 adults)  · Redness or swelling where the shot was given (about 1 person in 5)  · Mild fever of at least 100.4°F (up to about 1 in 25 adolescents or 1 in 100 adults)  · Headache (about 3 or 4 people in 10)  · Tiredness (about 1 person in 3 or 4)  · Nausea, vomiting, diarrhea, stomach ache (up to 1 in 4 adolescents or 1 in 10 adults)  · Chills, sore joints (about 1 person in 10)  · Body aches (about 1 person in 3 or 4)  · Rash, swollen glands (uncommon)  Moderate problems following Tdap  (Interfered with activities, but did not require medical attention)  · Pain where the shot was given (up to 1 in 5 or 6)  · Redness or swelling where the shot was given (up to about 1 in 16 adolescents or 1 in 12 adults)  · Fever over 102°F (about 1 in 100 adolescents or 1 in 250 adults)  · Headache (about 1 in 7 adolescents or 1 in 10 adults)  · Nausea, vomiting, diarrhea, stomach ache (up to 1 or 3 people in 100)  · Swelling of the entire arm where the shot was given (up to about 1 in 500).  Severe problems following Tdap  (Unable to perform usual activities; required medical attention)  · Swelling, severe pain, bleeding and redness in the arm where the shot was given (rare).  Problems that could happen after any vaccine:  · People sometimes faint after a medical procedure, including vaccination. Sitting or lying down for about 15 minutes can help prevent fainting, and injuries caused by a fall. Tell your doctor if you feel dizzy, or have vision changes or ringing in the ears.  · Some people get severe pain in the shoulder and have difficulty moving the arm where a shot was given. This happens very rarely.  · Any medication can cause a severe allergic reaction. Such reactions from a vaccine are very rare, estimated at fewer than 1 in a million doses, and would happen within a few minutes to a few hours after the vaccination.  As with any medicine, there is a very remote chance of a vaccine causing a serious  injury or death.  The safety of vaccines is always being monitored. For more information, visit: www.cdc.gov/vaccinesafety/  5. What if there is a serious problem?  What should I look for?  · Look for anything that concerns you, such as signs of a severe allergic reaction, very high fever, or unusual behavior.  Signs of a severe allergic reaction can include hives, swelling of the face and throat, difficulty breathing, a fast heartbeat, dizziness, and weakness. These would usually start a few minutes to a few hours after the vaccination.  What should I do?  · If you think it is a severe allergic reaction or other emergency that can't wait, call 9-1-1 or get the person to the nearest hospital. Otherwise,   call your doctor.  · Afterward, the reaction should be reported to the Vaccine Adverse Event Reporting System (VAERS). Your doctor might file this report, or you can do it yourself through the VAERS web site at www.vaers.hhs.gov, or by calling 1-800-822-7967.  VAERS does not give medical advice.  6. The National Vaccine Injury Compensation Program  The National Vaccine Injury Compensation Program (VICP) is a federal program that was created to compensate people who may have been injured by certain vaccines.  Persons who believe they may have been injured by a vaccine can learn about the program and about filing a claim by calling 1-800-338-2382 or visiting the VICP website at www.hrsa.gov/vaccinecompensation. There is a time limit to file a claim for compensation.  7. How can I learn more?  · Ask your doctor. He or she can give you the vaccine package insert or suggest other sources of information.  · Call your local or state health department.  · Contact the Centers for Disease Control and Prevention (CDC):  ? Call 1-800-232-4636 (1-800-CDC-INFO) or  ? Visit CDC's website at www.cdc.gov/vaccines  Vaccine Information Statement Tdap Vaccine (07/14/2013)  This information is not intended to replace advice given to you  by your health care provider. Make sure you discuss any questions you have with your health care provider.  Document Released: 11/06/2011 Document Revised: 12/23/2017 Document Reviewed: 12/23/2017  Elsevier Interactive Patient Education © 2019 Elsevier Inc.

## 2018-07-25 DIAGNOSIS — M19019 Primary osteoarthritis, unspecified shoulder: Secondary | ICD-10-CM | POA: Diagnosis not present

## 2018-07-25 DIAGNOSIS — R269 Unspecified abnormalities of gait and mobility: Secondary | ICD-10-CM | POA: Diagnosis not present

## 2018-07-25 DIAGNOSIS — E1165 Type 2 diabetes mellitus with hyperglycemia: Secondary | ICD-10-CM | POA: Diagnosis not present

## 2018-07-25 DIAGNOSIS — I69354 Hemiplegia and hemiparesis following cerebral infarction affecting left non-dominant side: Secondary | ICD-10-CM | POA: Diagnosis not present

## 2018-08-05 ENCOUNTER — Other Ambulatory Visit: Payer: Self-pay | Admitting: *Deleted

## 2018-08-05 NOTE — Patient Outreach (Signed)
[facility]) Care Management  08/05/2018  Sharon York 1945-08-27 [id number]   RN Health Coach attempted follow up outreach call to patient.  Patient was unavailable. HIPPA compliance voicemail message left with return callback number.  Plan: RN will call patient again within 30 days.  [name] BSN RN [facility] Care Management [phone]

## 2018-08-11 ENCOUNTER — Ambulatory Visit: Payer: Medicare Other | Admitting: Urology

## 2018-08-25 DIAGNOSIS — E1165 Type 2 diabetes mellitus with hyperglycemia: Secondary | ICD-10-CM | POA: Diagnosis not present

## 2018-08-25 DIAGNOSIS — I69354 Hemiplegia and hemiparesis following cerebral infarction affecting left non-dominant side: Secondary | ICD-10-CM | POA: Diagnosis not present

## 2018-08-25 DIAGNOSIS — R269 Unspecified abnormalities of gait and mobility: Secondary | ICD-10-CM | POA: Diagnosis not present

## 2018-08-25 DIAGNOSIS — M19019 Primary osteoarthritis, unspecified shoulder: Secondary | ICD-10-CM | POA: Diagnosis not present

## 2018-09-08 ENCOUNTER — Ambulatory Visit: Payer: Medicare Other | Admitting: Urology

## 2018-09-10 ENCOUNTER — Ambulatory Visit (INDEPENDENT_AMBULATORY_CARE_PROVIDER_SITE_OTHER): Payer: Medicare Other

## 2018-09-10 ENCOUNTER — Encounter: Payer: Self-pay | Admitting: Emergency Medicine

## 2018-09-10 ENCOUNTER — Other Ambulatory Visit: Payer: Self-pay

## 2018-09-10 ENCOUNTER — Ambulatory Visit
Admission: EM | Admit: 2018-09-10 | Discharge: 2018-09-10 | Disposition: A | Discharge status: Home / Self Care | Payer: Medicare Other | Attending: Family Medicine | Admitting: Family Medicine

## 2018-09-10 DIAGNOSIS — S43402A Unspecified sprain of left shoulder joint, initial encounter: Secondary | ICD-10-CM

## 2018-09-10 DIAGNOSIS — M25512 Pain in left shoulder: Secondary | ICD-10-CM | POA: Diagnosis not present

## 2018-09-10 DIAGNOSIS — S43032A Inferior subluxation of left humerus, initial encounter: Secondary | ICD-10-CM | POA: Diagnosis not present

## 2018-09-10 DIAGNOSIS — W010XXA Fall on same level from slipping, tripping and stumbling without subsequent striking against object, initial encounter: Secondary | ICD-10-CM | POA: Diagnosis not present

## 2018-09-10 NOTE — ED Triage Notes (Signed)
Patient states she fell on Tuesday and hurt her left shoulder. She states it continues to be sore but she wants someone to look at her shoulder.

## 2018-09-10 NOTE — ED Provider Notes (Signed)
[facility]    CSN: [id number] Arrival date & time: 09/10/18  0932     History   Chief Complaint Chief Complaint  Patient presents with  . Shoulder Pain    HPI CITLALLY CAPTAIN is a 73 y.o. female.   73 yo female with a c/o left shoulder injury yesterday after falling on it. States slightly sore. Patient has decreased mobility of that arm due to a prior stroke.   The history is provided by the patient.  Shoulder Pain    Past Medical History:  Diagnosis Date  . Absence of bladder continence 12/15/2014  . Blood pressure elevated without history of HTN 12/15/2014  . Diabetes mellitus without complication ([place])   . Osteoporosis   . Overactive bladder   . Stroke [facility])     Patient Active Problem List   Diagnosis Date Noted  . Type 2 diabetes mellitus with other specified complication ([place]) 78/29/5621  . Right shoulder pain 03/31/2018  . PMB (postmenopausal bleeding) 11/01/2017  . Hyperlipidemia associated with type 2 diabetes mellitus ([place]) 06/12/2017  . Tinea corporis 03/17/2015  . Abnormal gait 12/15/2014  . Altered bowel function 12/15/2014  . Hemiparesis affecting left side as late effect of cerebrovascular accident (CVA) ([place]) 12/15/2014  . Obstructive sleep apnea of adult 12/15/2014  . Arthritis of shoulder region, degenerative 12/15/2014  . OP (osteoporosis) 12/15/2014  . Allergic rhinitis, seasonal 12/15/2014  . Mixed incontinence 03/11/2013    Past Surgical History:  Procedure Laterality Date  . CESAREAN SECTION  1975  . COLONOSCOPY  2010   normal    OB History   No obstetric history on file.      Home Medications    Prior to Admission medications   Medication Sig Start Date End Date Taking? Authorizing Provider  ACCU-CHEK AVIVA PLUS test strip  USE 1 TEST STRIP UP TO 4 TIMES DAILY 05/31/17  Yes [name], MD  ACCU-CHEK FASTCLIX LANCETS MISC 1 each by Does not apply route 2 (two) times daily. 11/26/16  Yes [name], MD   alendronate (FOSAMAX) 70 MG tablet TAKE 1 TABLET BY MOUTH ONCE A WEEK TAKE  WITH  A  FULL  GLASS  OF  WATER  ON  AN  EMPTY  STOMACH 02/18/18  Yes [name], MD  aspirin 81 MG chewable tablet Chew 1 tablet by mouth daily.   Yes [provider]  atorvastatin (LIPITOR) 10 MG tablet Take 1 tablet (10 mg total) by mouth daily. 05/30/18  Yes [name], MD  baclofen (LIORESAL) 10 MG tablet Take 1 tablet (10 mg total) by mouth 2 (two) times daily. 01/14/18  Yes [name], MD  blood glucose meter kit and supplies KIT Dispense based on patient and insurance preference. Use up to 3 times daily as directed to check Blood Sugar for ICD10: E11.69 Uncontrolled DM2 06/03/18  Yes [name], MD  Calcium Carbonate-Vitamin D (CALCIUM 500 + D) 500-125 MG-UNIT TABS Take 1 tablet by mouth daily.    Yes [provider]  Continuous Blood Gluc Receiver (FREESTYLE LIBRE READER) DEVI 1 each by Does not apply route 2 (two) times daily. 06/09/18  Yes [name], MD  Continuous Blood Gluc Sensor (FREESTYLE LIBRE 14 DAY SENSOR) MISC Apply 1 each topically as needed. 06/11/18  Yes [name], MD  glucose blood test strip Dispense Strips compatable to insurance and meter covered in order to check BS up to 3 times daily  for ICD 10 E11.69 06/03/18  Yes [name], MD  Lancets MISC Use brand compatible to insurance and meter... to check BS up to 3 times daily for ICD10  E11.69. 06/03/18  Yes [name], MD  metFORMIN (GLUCOPHAGE-XR) 500 MG 24 hr tablet Take 1 tablet (500 mg total) by mouth daily with breakfast. 05/30/18  Yes [name], MD  Multiple Vitamins-Minerals (MULTIVITAMIN ADULT PO) Take by mouth.   Yes [provider]    Family History Family History  Problem Relation Age of Onset  . Diabetes Mother   . Thyroid disease Mother   . Breast cancer Maternal Aunt   . Breast cancer Cousin        mat cousin  . Prostate cancer Brother   .  Kidney failure Brother   . Kidney cancer Son   . Bladder Cancer Neg Hx     Social History Social History   Tobacco Use  . Smoking status: Never Smoker  . Smokeless tobacco: Never Used  . Tobacco comment: smoking cessation materials not required  Substance Use Topics  . Alcohol use: No    Alcohol/week: 0.0 standard drinks  . Drug use: Never     Allergies   Nitrofurantoin and Iodinated diagnostic agents   Review of Systems Review of Systems   Physical Exam Triage Vital Signs ED Triage Vitals  Enc Vitals Group     BP 09/10/18 0948 (!) 142/81     Pulse Rate 09/10/18 0948 84     Resp 09/10/18 0948 18     Temp 09/10/18 0948 98.3 F (36.8 C)     Temp src --      SpO2 09/10/18 0948 97 %     Weight 09/10/18 0946 150 lb (68 kg)     Height 09/10/18 0946 _0  (1.448 m)     Head Circumference --      Peak Flow --      Pain Score 09/10/18 0946 0     Pain Loc --      Pain Edu? --      Excl. in [place]? --    No data found.  Updated Vital Signs BP (!) 142/81 (BP Location: Right Arm)   Pulse 84   Temp 98.3 F (36.8 C)   Resp 18   Ht _1  (1.448 m)   Wt 68 kg   SpO2 97%   BMI 32.46 kg/m   Visual Acuity Right Eye Distance:   Left Eye Distance:   Bilateral Distance:    Right Eye Near:   Left Eye Near:    Bilateral Near:     Physical Exam Vitals signs and nursing note reviewed.  Constitutional:      General: She is not in acute distress.    Appearance: She is not toxic-appearing or diaphoretic.  Musculoskeletal:     Left shoulder: She exhibits decreased range of motion and tenderness. She exhibits no effusion, no crepitus, no deformity, no laceration, no pain, no spasm, normal pulse and normal strength.  Neurological:     Mental Status: She is alert.      UC Treatments / Results  Labs (all labs ordered are listed, but only abnormal results are displayed) Labs Reviewed - No data to display  EKG None  Radiology Dg Shoulder Left  Result Date:  09/10/2018 CLINICAL DATA:  Recent fall. Left arm pain. Left-sided weakness due to a prior stroke. EXAM: LEFT SHOULDER - 2+ VIEW COMPARISON:  07/02/2005 FINDINGS: The left  humeral neck fracture on the prior study has healed in the interim with residual angular deformity. No acute fracture is identified. There is progressive cortical irregularity of the humeral head which may be in part related to hemiplegia from the patient's prior stroke. There is inferior subluxation of the humeral head relative to the glenoid without dislocation. The soft tissues are unremarkable. IMPRESSION: 1. Remote left humeral neck fracture without acute osseous abnormality identified. 2. Inferior subluxation of the humeral head. Electronically Signed   By: Logan Bores M.D.   On: 09/10/2018 11:00    Procedures Procedures (including critical care time)  Medications Ordered in UC Medications - No data to display  Initial Impression / Assessment and Plan / UC Course  I have reviewed the triage vital signs and the nursing notes.  Pertinent labs & imaging results that were available during my care of the patient were reviewed by me and considered in my medical decision making (see chart for details).      Final Clinical Impressions(s) / UC Diagnoses   Final diagnoses:  Sprain of left shoulder, unspecified shoulder sprain type, initial encounter     Discharge Instructions     Rest, tylenol as needed    ED Prescriptions    None      1. x-ray result (negative for acute injury) and diagnosis reviewed with patient 2.  Recommend supportive treatment as above 3. Follow-up prn if symptoms worsen or don't improve  Controlled Substance Prescriptions [place] Controlled Substance Registry consulted? Not Applicable   Norval Gable, MD 09/10/18 1316

## 2018-09-10 NOTE — Discharge Instructions (Signed)
Rest, tylenol as needed

## 2018-09-15 ENCOUNTER — Ambulatory Visit: Payer: Medicare Other | Admitting: Internal Medicine

## 2018-09-15 NOTE — Progress Notes (Deleted)
    Date:  09/15/2018   Name:  Sharon York   DOB:  [birth year]   MRN:  [id number]   Chief Complaint: No chief complaint on file.  Diabetes  She presents for her follow-up diabetic visit. She has type 2 diabetes mellitus. Her disease course has been stable. Current diabetic treatment includes diet. She is compliant with treatment most of the time. Her weight is stable. She is following a generally healthy diet. There is no change in her home blood glucose trend. An ACE inhibitor/angiotensin II receptor blocker is not being taken.  Hyperlipidemia  The problem is controlled. Current antihyperlipidemic treatment includes statins. The current treatment provides significant improvement of lipids.   Lab Results  Component Value Date   HGBA1C 6.9 (H) 04/21/2018   Lab Results  Component Value Date   CHOL 141 01/14/2018   HDL 61 01/14/2018   LDLCALC 62 01/14/2018   TRIG 92 01/14/2018   CHOLHDL 2.3 01/14/2018     Review of Systems  Patient Active Problem List   Diagnosis Date Noted  . Type II diabetes mellitus with complication (HCC) 04/21/2018  . Right shoulder pain 03/31/2018  . PMB (postmenopausal bleeding) 11/01/2017  . Hyperlipidemia associated with type 2 diabetes mellitus (HCC) 06/12/2017  . Tinea corporis 03/17/2015  . Abnormal gait 12/15/2014  . Altered bowel function 12/15/2014  . Hemiparesis affecting left side as late effect of cerebrovascular accident (CVA) (HCC) 12/15/2014  . Obstructive sleep apnea of adult 12/15/2014  . Arthritis of shoulder region, degenerative 12/15/2014  . OP (osteoporosis) 12/15/2014  . Allergic rhinitis, seasonal 12/15/2014  . Mixed incontinence 03/11/2013    Allergies  Allergen Reactions  . Nitrofurantoin Nausea Only  . Iodinated Diagnostic Agents Hives and Cough    Patient developed a hive on her left lip after injection as well as some coughing.     Past Surgical History:  Procedure Laterality Date  . CESAREAN SECTION  1975  .  COLONOSCOPY  2010   normal    Social History   Tobacco Use  . Smoking status: Never Smoker  . Smokeless tobacco: Never Used  . Tobacco comment: smoking cessation materials not required  Substance Use Topics  . Alcohol use: No    Alcohol/week: 0.0 standard drinks  . Drug use: Never     Medication list has been reviewed and updated.  No outpatient medications have been marked as taking for the 09/15/18 encounter (Appointment) with Reubin Milan, MD.    [facility] 2/9 Scores 07/10/2018 06/06/2018 03/31/2018 03/11/2018  PHQ - 2 Score 0 0 0 0  PHQ- 9 Score - - - -    BP Readings from Last 3 Encounters:  09/10/18 (!) 142/81  07/10/18 104/68  06/06/18 134/80    Physical Exam  Wt Readings from Last 3 Encounters:  09/10/18 150 lb (68 kg)  07/10/18 160 lb (72.6 kg)  06/06/18 158 lb (71.7 kg)    There were no vitals taken for this visit.  Assessment and Plan:

## 2018-09-16 ENCOUNTER — Other Ambulatory Visit: Payer: Self-pay | Admitting: *Deleted

## 2018-09-16 NOTE — Patient Outreach (Signed)
[facility]) Care Management  09/16/2018  Sharon York July 07, 1945 [id number]   RN Health Coach attempted follow up outreach call to patient.  Patient was unavailable. No voicemail pick up Plan: RN will call patient again within 30 days.  [name] BSN RN [facility] Care Management [phone]

## 2018-09-19 ENCOUNTER — Encounter: Payer: Self-pay | Admitting: Internal Medicine

## 2018-09-19 ENCOUNTER — Ambulatory Visit (INDEPENDENT_AMBULATORY_CARE_PROVIDER_SITE_OTHER): Payer: Medicare Other | Admitting: Internal Medicine

## 2018-09-19 ENCOUNTER — Other Ambulatory Visit: Payer: Self-pay

## 2018-09-19 VITALS — BP 124/78 | HR 78 | Ht <= 58 in | Wt 165.0 lb

## 2018-09-19 DIAGNOSIS — Z1231 Encounter for screening mammogram for malignant neoplasm of breast: Secondary | ICD-10-CM | POA: Diagnosis not present

## 2018-09-19 DIAGNOSIS — E1165 Type 2 diabetes mellitus with hyperglycemia: Secondary | ICD-10-CM | POA: Diagnosis not present

## 2018-09-19 DIAGNOSIS — Z1211 Encounter for screening for malignant neoplasm of colon: Secondary | ICD-10-CM | POA: Diagnosis not present

## 2018-09-19 DIAGNOSIS — IMO0001 Reserved for inherently not codable concepts without codable children: Secondary | ICD-10-CM

## 2018-09-19 NOTE — Progress Notes (Signed)
Date:  09/19/2018   Name:  Sharon York   DOB:  [birth year]   MRN:  [id number]   Chief Complaint: Diabetes (4 month followup. )  Diabetes  She presents for her follow-up diabetic visit. She has type 2 diabetes mellitus. Her disease course has been stable. Pertinent negatives for hypoglycemia include no headaches or tremors. Pertinent negatives for diabetes include no chest pain, no fatigue, no polydipsia and no polyuria. Current diabetic treatment includes oral agent (monotherapy). She is compliant with treatment all of the time. Her weight is stable. There is no change in her home blood glucose trend.   Mammogram - due in July.  Patient denies breast issues.  CRC screening - referred for CT Colonoscopy.  Not scheduled due to equipment issues and pt has not heard back.  I will give her the number to call for follow up.  Lab Results  Component Value Date   HGBA1C 6.9 (H) 04/21/2018     Review of Systems  Constitutional: Negative for appetite change, chills, fatigue, fever and unexpected weight change.  HENT: Negative for tinnitus and trouble swallowing.   Eyes: Negative for visual disturbance.  Respiratory: Negative for cough, chest tightness and shortness of breath.   Cardiovascular: Negative for chest pain, palpitations and leg swelling.  Gastrointestinal: Negative for abdominal pain.  Endocrine: Negative for polydipsia and polyuria.  Genitourinary: Negative for dysuria and hematuria.  Musculoskeletal: Negative for arthralgias.  Neurological: Negative for tremors, numbness and headaches.  Psychiatric/Behavioral: Negative for dysphoric mood.    Patient Active Problem List   Diagnosis Date Noted  . Type II diabetes mellitus with complication ([place]) 67/67/2094  . Right shoulder pain 03/31/2018  . PMB (postmenopausal bleeding) 11/01/2017  . Hyperlipidemia associated with type 2 diabetes mellitus ([place]) 06/12/2017  . Tinea corporis 03/17/2015  . Abnormal gait 12/15/2014  .  Altered bowel function 12/15/2014  . Hemiparesis affecting left side as late effect of cerebrovascular accident (CVA) ([place]) 12/15/2014  . Obstructive sleep apnea of adult 12/15/2014  . Arthritis of shoulder region, degenerative 12/15/2014  . OP (osteoporosis) 12/15/2014  . Allergic rhinitis, seasonal 12/15/2014  . Mixed incontinence 03/11/2013    Allergies  Allergen Reactions  . Nitrofurantoin Nausea Only  . Iodinated Diagnostic Agents Hives and Cough    Patient developed a hive on her left lip after injection as well as some coughing.     Past Surgical History:  Procedure Laterality Date  . CESAREAN SECTION  1975  . COLONOSCOPY  2010   normal    Social History   Tobacco Use  . Smoking status: Never Smoker  . Smokeless tobacco: Never Used  . Tobacco comment: smoking cessation materials not required  Substance Use Topics  . Alcohol use: No    Alcohol/week: 0.0 standard drinks  . Drug use: Never     Medication list has been reviewed and updated.  Current Meds  Medication Sig  . ACCU-CHEK AVIVA PLUS test strip  USE 1 TEST STRIP UP TO 4 TIMES DAILY  . ACCU-CHEK FASTCLIX LANCETS MISC 1 each by Does not apply route 2 (two) times daily.  Marland Kitchen alendronate (FOSAMAX) 70 MG tablet TAKE 1 TABLET BY MOUTH ONCE A WEEK TAKE  WITH  A  FULL  GLASS  OF  WATER  ON  AN  EMPTY  STOMACH  . aspirin 81 MG chewable tablet Chew 1 tablet by mouth daily.  Marland Kitchen atorvastatin (LIPITOR) 10 MG tablet Take 1 tablet (10 mg total) by mouth daily.  Marland Kitchen  baclofen (LIORESAL) 10 MG tablet Take 1 tablet (10 mg total) by mouth 2 (two) times daily.  . blood glucose meter kit and supplies KIT Dispense based on patient and insurance preference. Use up to 3 times daily as directed to check Blood Sugar for ICD10: E11.69 Uncontrolled DM2  . Calcium Carbonate-Vitamin D (CALCIUM 500 + D) 500-125 MG-UNIT TABS Take 1 tablet by mouth daily.   . Continuous Blood Gluc Receiver (FREESTYLE LIBRE READER) DEVI 1 each by Does not apply  route 2 (two) times daily.  . Continuous Blood Gluc Sensor (FREESTYLE LIBRE 14 DAY SENSOR) MISC Apply 1 each topically as needed.  Marland Kitchen glucose blood test strip Dispense Strips compatable to insurance and meter covered in order to check BS up to 3 times daily  for ICD 10 E11.69  . Homeopathic Products (LIVER SUPPORT SL) Place under the tongue.  . Lancets MISC Use brand compatible to insurance and meter... to check BS up to 3 times daily for ICD10  E11.69.  . metFORMIN (GLUCOPHAGE-XR) 500 MG 24 hr tablet Take 1 tablet (500 mg total) by mouth daily with breakfast.  . Multiple Vitamins-Minerals (MULTIVITAMIN ADULT PO) Take by mouth.    PHQ 2/9 Scores 09/19/2018 07/10/2018 06/06/2018 03/31/2018  PHQ - 2 Score 0 0 0 0  PHQ- 9 Score - - - -    BP Readings from Last 3 Encounters:  09/19/18 124/78  09/10/18 (!) 142/81  07/10/18 104/68    Physical Exam Vitals signs and nursing note reviewed.  Constitutional:      General: She is not in acute distress.    Appearance: She is well-developed.  HENT:     Head: Normocephalic and atraumatic.  Neck:     Musculoskeletal: Normal range of motion and neck supple.  Cardiovascular:     Rate and Rhythm: Normal rate and regular rhythm.     Pulses: Normal pulses.     Heart sounds: Normal heart sounds. No murmur.  Pulmonary:     Effort: Pulmonary effort is normal. No respiratory distress.     Breath sounds: Normal breath sounds. No wheezing or rhonchi.  Musculoskeletal: Normal range of motion.     Right lower leg: No edema.     Left lower leg: No edema.  Skin:    General: Skin is warm and dry.     Findings: No rash.  Neurological:     Mental Status: She is alert and oriented to person, place, and time.  Psychiatric:        Behavior: Behavior normal.        Thought Content: Thought content normal.     Wt Readings from Last 3 Encounters:  09/19/18 165 lb (74.8 kg)  09/10/18 150 lb (68 kg)  07/10/18 160 lb (72.6 kg)    BP 124/78   Pulse 78   Ht  _0  (1.448 m)   Wt 165 lb (74.8 kg)   SpO2 98%   BMI 35.71 kg/m   Assessment and Plan: 1. Uncontrolled type 2 diabetes mellitus without complication, without long-term current use of insulin ([place]) Doing well on oral agents - Hemoglobin T4S - Basic metabolic panel  2. Encounter for screening mammogram for breast cancer Schedule at [facility]; Future  3. Colon cancer screening Pt to call to schedule CT colonoscopy at [place] - already ordered by GI    Partially dictated using Dragon software. Any errors are unintentional.  Halina Maidens, MD [facility]  09/19/2018

## 2018-09-19 NOTE — Patient Instructions (Signed)
She needs to call [facility] imaging to schedule her ct colonography at [phone]

## 2018-09-20 LAB — BASIC METABOLIC PANEL
BUN/Creatinine Ratio: 19 (ref 12–28)
BUN: 12 mg/dL (ref 8–27)
CO2: 28 mmol/L (ref 20–29)
Calcium: 9.6 mg/dL (ref 8.7–10.3)
Chloride: 98 mmol/L (ref 96–106)
Creatinine, Ser: 0.63 mg/dL (ref 0.57–1.00)
GFR calc Af Amer: 101 mL/min/{1.73_m2} (ref >59)
GFR calc non Af Amer: 88 mL/min/{1.73_m2} (ref >59)
Glucose: 154 mg/dL — ABNORMAL HIGH (ref 65–99)
Potassium: 4 mmol/L (ref 3.5–5.2)
Sodium: 139 mmol/L (ref 134–144)

## 2018-09-20 LAB — HEMOGLOBIN A1C
Est. average glucose Bld gHb Est-mCnc: 157 mg/dL
Hgb A1c MFr Bld: 7.1 % — ABNORMAL HIGH (ref 4.8–5.6)

## 2018-09-24 DIAGNOSIS — I69354 Hemiplegia and hemiparesis following cerebral infarction affecting left non-dominant side: Secondary | ICD-10-CM | POA: Diagnosis not present

## 2018-09-24 DIAGNOSIS — M19019 Primary osteoarthritis, unspecified shoulder: Secondary | ICD-10-CM | POA: Diagnosis not present

## 2018-09-24 DIAGNOSIS — R269 Unspecified abnormalities of gait and mobility: Secondary | ICD-10-CM | POA: Diagnosis not present

## 2018-09-24 DIAGNOSIS — E1165 Type 2 diabetes mellitus with hyperglycemia: Secondary | ICD-10-CM | POA: Diagnosis not present

## 2018-10-03 ENCOUNTER — Ambulatory Visit
Admission: RE | Admit: 2018-10-03 | Discharge: 2018-10-03 | Disposition: A | Discharge status: Home / Self Care | Payer: Medicare Other | Source: Ambulatory Visit | Attending: Gastroenterology | Admitting: Gastroenterology

## 2018-10-03 ENCOUNTER — Other Ambulatory Visit: Payer: Medicare Other

## 2018-10-03 DIAGNOSIS — R6889 Other general symptoms and signs: Secondary | ICD-10-CM | POA: Diagnosis not present

## 2018-10-03 DIAGNOSIS — Z8371 Family history of colonic polyps: Secondary | ICD-10-CM

## 2018-10-17 ENCOUNTER — Other Ambulatory Visit: Payer: Self-pay | Admitting: *Deleted

## 2018-10-17 NOTE — Patient Outreach (Addendum)
[facility]) Care Management  10/17/2018  KISSEY CHOU [birth year] [id number]   RN Health Coach attempted #3 follow up outreach call to patient.  Patient was unavailable. HIPPA compliance voicemail message left with return callback number.  Plan: RN will close within 10 business days if not return call RN sent unsuccessful outreach letter to patient  [name] BSN RN [facility] Care Management [phone]

## 2018-10-25 DIAGNOSIS — E1165 Type 2 diabetes mellitus with hyperglycemia: Secondary | ICD-10-CM | POA: Diagnosis not present

## 2018-10-25 DIAGNOSIS — R269 Unspecified abnormalities of gait and mobility: Secondary | ICD-10-CM | POA: Diagnosis not present

## 2018-10-25 DIAGNOSIS — M19019 Primary osteoarthritis, unspecified shoulder: Secondary | ICD-10-CM | POA: Diagnosis not present

## 2018-10-25 DIAGNOSIS — I69354 Hemiplegia and hemiparesis following cerebral infarction affecting left non-dominant side: Secondary | ICD-10-CM | POA: Diagnosis not present

## 2018-10-27 ENCOUNTER — Ambulatory Visit: Payer: Medicare Other | Admitting: Urology

## 2018-10-29 ENCOUNTER — Other Ambulatory Visit: Payer: Self-pay | Admitting: *Deleted

## 2018-10-29 NOTE — Patient Outreach (Signed)
. [facility]) Care Management  10/29/2018   BILLIEJO SORTO [birth year] [id number]  RN Health Coach received  telephone call from patient.  [employer] compliance verified. Per patient she is wanting to get a pedometer and wanted to know if [facility] provided those. RN informed her that [facility] does not provide pedometers but she will be able to find one in her [facility] booklet on page 30 for 40.00.  Patient is wanting to use the step exercise since unable to go out much due to the COVID -19. She is doing 1000 steps in the am and 1000 steps in the pm. RN discussed with patient about increasing by 500 steps in am and 500 steps in pm and we will discuss further increase next outreach.Patient fasting blood sugar is 156. A1C has increased to 7.1. Patient is drinking more water instead of sodas. Per patient she is feeling real good. Patient has agreed to follow up outreach calls.   Current Medications:  Current Outpatient Medications  Medication Sig Dispense Refill  . ACCU-CHEK AVIVA PLUS test strip  USE 1 TEST STRIP UP TO 4 TIMES DAILY 400 each 3  . ACCU-CHEK FASTCLIX LANCETS MISC 1 each by Does not apply route 2 (two) times daily. 100 each 3  . alendronate (FOSAMAX) 70 MG tablet TAKE 1 TABLET BY MOUTH ONCE A WEEK TAKE  WITH  A  FULL  GLASS  OF  WATER  ON  AN  EMPTY  STOMACH 12 tablet 4  . aspirin 81 MG chewable tablet Chew 1 tablet by mouth daily.    Marland Kitchen atorvastatin (LIPITOR) 10 MG tablet Take 1 tablet (10 mg total) by mouth daily. 90 tablet 3  . baclofen (LIORESAL) 10 MG tablet Take 1 tablet (10 mg total) by mouth 2 (two) times daily. 60 tablet 12  . blood glucose meter kit and supplies KIT Dispense based on patient and insurance preference. Use up to 3 times daily as directed to check Blood Sugar for ICD10: E11.69 Uncontrolled DM2 1 each 0  . Calcium Carbonate-Vitamin D (CALCIUM 500 + D) 500-125 MG-UNIT TABS Take 1 tablet by mouth daily.     . Continuous Blood Gluc Receiver (FREESTYLE LIBRE READER)  DEVI 1 each by Does not apply route 2 (two) times daily. 1 Device 0  . Continuous Blood Gluc Sensor (FREESTYLE LIBRE 14 DAY SENSOR) MISC Apply 1 each topically as needed. 2 each 5  . glucose blood test strip Dispense Strips compatable to insurance and meter covered in order to check BS up to 3 times daily  for ICD 10 E11.69 100 each 12  . Homeopathic Products (LIVER SUPPORT SL) Place under the tongue.    . Lancets MISC Use brand compatible to insurance and meter... to check BS up to 3 times daily for ICD10  E11.69. 100 each 12  . metFORMIN (GLUCOPHAGE-XR) 500 MG 24 hr tablet Take 1 tablet (500 mg total) by mouth daily with breakfast. 90 tablet 1  . Multiple Vitamins-Minerals (MULTIVITAMIN ADULT PO) Take by mouth.     No current facility-administered medications for this visit.     Functional Status:  In your present state of health, do you have any difficulty performing the following activities: 10/29/2018 03/11/2018  Hearing? Y Y  Comment - -  Vision? N N  Comment - -  Difficulty concentrating or making decisions? N N  Walking or climbing stairs? Y Y  Comment - -  Dressing or bathing? N N  Comment - -  Doing errands, shopping? Tempie Donning  Preparing Food and eating ? N N  Comment - -  Using the Toilet? N N  In the past six months, have you accidently leaked urine? Y Y  Do you have problems with loss of bowel control? N N  Managing your Medications? N N  Managing your Finances? N N  Housekeeping or managing your Housekeeping? Y Y  Some recent data might be hidden    Fall/Depression Screening: Fall Risk  10/29/2018 09/19/2018 07/10/2018  Falls in the past year? 0 1 0  Comment - - -  Number falls in past yr: - 0 0  Injury with Fall? - 0 -  Risk Factor Category  - - -  Risk for fall due to : Impaired balance/gait;Impaired mobility History of fall(s);Impaired balance/gait;Impaired mobility;Impaired vision History of fall(s);Impaired balance/gait;Impaired mobility;Medication side effect  Risk  for fall due to: Comment - - -  Follow up Falls evaluation completed;Falls prevention discussed - Falls evaluation completed;Falls prevention discussed   PHQ 2/9 Scores 10/29/2018 09/19/2018 07/10/2018 06/06/2018 03/31/2018 03/11/2018 01/08/2018  PHQ - 2 Score 0 0 0 0 0 0 0  PHQ- 9 Score - - - - - - 0   THN CM Care Plan Problem One     Most Recent Value  Care Plan Problem One  Knowledge Deficit in Self Management of Diabetes  Role Documenting the Problem One  [place] for Problem One  Active  THN Long Term Goal   Patient will see a decrease in A1C from 6.6 to goal of 6.0 within the next 90 days  THN Long Term Goal Start Date  10/29/18  Interventions for Problem One Long Term Goal  RN discussed reason for increase. Patient stated she loves to cook. RN reiterated eating healthy and exercise.  THN CM Short Term Goal #1   Patient will look into getting a scooter for better mobility within the next 30 days  THN CM Short Term Goal #1 Start Date  10/29/18  Interventions for Short Term Goal #1  Patient is looking for speical attachement for scooter. RN willfollow up with further discussion  THN CM Short Term Goal #2   Patient will report checking and documenting blood sugars within the next 30 days  THN CM Short Term Goal #2 Met Date  10/29/18  [facility] CM Short Term Goal #3  Patient will identify foods that make up a healthy snack within the next 30 days  THN CM Short Term Goal #3 Start Date  10/29/18  Interventions for Short Tern Goal #3  RN reiterates healthy snacks. Patient loves to cook and stated she loves to eat. RN will follow up with further discussion  THN CM Short Term Goal #4  Patient will verbalize having a better understanding of healthy diabetic foods choices for dining out within the next 30 days  THN CM Short Term Goal #4 Start Date  10/29/18  Interventions for Short Term Goal #4  Patient has not been going out to eat due to the covid-19. Patient will start going once later. RN  willfollow up with discussion  THN CM Short Term Goal #5   Patient will verbalize getting a pedometer within the next 30 days  THN CM Short Term Goal #5 Start Date  10/29/18  Interventions for Short Term Goal #5  Patient wanted to know if [facility] provided pedometers. RN explained that this is in the [facility] catalog page 30 and she will be able to  order one. RN encourage patient to increase her steps slowly by 500 in the morning and 500 in the evening. WE will further discuss how many steps to make a difference for weight loss.        Assessment:  A1C increase 7.1 from 6.9 Patient is starting a counting her step exercise routine Fasting blood sugar is 156  Plan:  RN discussed eating healthy Patient will increase step exercise from 1000 am and pm to 1500 am and pm Patient will purchase a pedometer with next catalog order Patient will continue to increase H2O intake instead of sodas RN discussed patient A1C increase and how to reduce RN will follow up within the month of September RN sent update assessment to PCP  [place] Management [phone]

## 2018-10-31 ENCOUNTER — Ambulatory Visit: Payer: Self-pay | Admitting: *Deleted

## 2018-11-20 ENCOUNTER — Other Ambulatory Visit: Payer: Self-pay

## 2018-11-20 DIAGNOSIS — R350 Frequency of micturition: Secondary | ICD-10-CM

## 2018-11-23 NOTE — Progress Notes (Signed)
11/24/2018 11:10 AM   Sharon York Jun 08, 1945 [id number]  Referring provider: [name], MD [address] [name],  [place] [address]  Chief Complaint  Patient presents with  . Follow-up    HPI: Patient is 73 year old female with a history of recurrent UTI's and a history of hematuria who presents today for follow up.  History of rUTI's Risk factors: age, vaginal atrophy and incontinence.  She drinks a quart of water daily.  She is not drinking soda or sweet tea.  She is drinking green tea when it is cool.  She stays away from juices due to her DM.  She does not drink coffee.    History of hematuria (high risk) Non-smoker.  CTU in 07/2017 noted the adrenal glands and kidneys are unremarkable. No renal, ureteral or bladder calculi or mass. The delayed images do not demonstrate any significant collecting system abnormalities and both ureters appear normal other than being deviated laterally in the pelvis by the enlarged fibroid uterus. No bladder mass or asymmetric bladder wall thickening.  Cystoscopy in 07/2017 with Dr. Matilde Sprang - NED.  No reports of gross hematuria.  Her UA is negative for hematuria.    Frequency She is using a device called Attain for her bladder control.  This is available through [employer].     PMH: Past Medical History:  Diagnosis Date  . Absence of bladder continence 12/15/2014  . Blood pressure elevated without history of HTN 12/15/2014  . Diabetes mellitus without complication ([place])   . Osteoporosis   . Overactive bladder   . Stroke [facility])     Surgical History: Past Surgical History:  Procedure Laterality Date  . CESAREAN SECTION  1975  . COLONOSCOPY  2010   normal    Home Medications:  Allergies as of 11/24/2018      Reactions   Nitrofurantoin Nausea Only   Iodinated Diagnostic Agents Hives, Cough   Patient developed a hive on her left lip after injection as well as some coughing.       Medication List       Accurate  as of November 24, 2018 11:10 AM. If you have any questions, ask your nurse or doctor.        Accu-Chek Aviva Plus test strip Generic drug: glucose blood USE 1 TEST STRIP UP TO 4 TIMES DAILY   glucose blood test strip Dispense Strips compatable to insurance and meter covered in order to check BS up to 3 times daily  for ICD 10 E11.69   Accu-Chek FastClix Lancets Misc 1 each by Does not apply route 2 (two) times daily.   Lancets Misc Use brand compatible to insurance and meter... to check BS up to 3 times daily for ICD10  E11.69.   alendronate 70 MG tablet Commonly known as: FOSAMAX TAKE 1 TABLET BY MOUTH ONCE A WEEK TAKE  WITH  A  FULL  GLASS  OF  WATER  ON  AN  EMPTY  STOMACH   aspirin 81 MG chewable tablet Chew 1 tablet by mouth daily.   atorvastatin 10 MG tablet Commonly known as: LIPITOR Take 1 tablet (10 mg total) by mouth daily.   baclofen 10 MG tablet Commonly known as: LIORESAL Take 1 tablet (10 mg total) by mouth 2 (two) times daily.   blood glucose meter kit and supplies Kit Dispense based on patient and insurance preference. Use up to 3 times daily as directed to check Blood Sugar for ICD10: E11.69 Uncontrolled DM2  Calcium 500 + D 500-125 MG-UNIT Tabs Generic drug: Calcium Carbonate-Vitamin D Take 1 tablet by mouth daily.   FreeStyle Libre 14 Day Sensor Misc Apply 1 each topically as needed.   FreeStyle [employer] 1 each by Does not apply route 2 (two) times daily.   LIVER SUPPORT SL Place under the tongue.   metFORMIN 500 MG 24 hr tablet Commonly known as: GLUCOPHAGE-XR Take 1 tablet (500 mg total) by mouth daily with breakfast.   MULTIVITAMIN ADULT PO Take by mouth.       Allergies:  Allergies  Allergen Reactions  . Nitrofurantoin Nausea Only  . Iodinated Diagnostic Agents Hives and Cough    Patient developed a hive on her left lip after injection as well as some coughing.     Family History: Family History  Problem Relation Age of  Onset  . Diabetes Mother   . Thyroid disease Mother   . Breast cancer Maternal Aunt   . Breast cancer Cousin        mat cousin  . Prostate cancer Brother   . Kidney failure Brother   . Kidney cancer Son   . Bladder Cancer Neg Hx     Social History:  reports that she has never smoked. She has never used smokeless tobacco. She reports that she does not drink alcohol or use drugs.  ROS: UROLOGY Frequent Urination?: No Hard to postpone urination?: No Burning/pain with urination?: No Get up at night to urinate?: No Leakage of urine?: No Urine stream starts and stops?: No Trouble starting stream?: No Do you have to strain to urinate?: No Blood in urine?: No Urinary tract infection?: No Sexually transmitted disease?: No Injury to kidneys or bladder?: No Painful intercourse?: No Weak stream?: No Currently pregnant?: No Vaginal bleeding?: No Last menstrual period?: n  Gastrointestinal Nausea?: No Vomiting?: No Indigestion/heartburn?: No Diarrhea?: No Constipation?: No  Constitutional Fever: No Night sweats?: No Weight loss?: No Fatigue?: No  Skin Skin rash/lesions?: No Itching?: No  Eyes Blurred vision?: No Double vision?: No  Ears/Nose/Throat Sore throat?: No Sinus problems?: No  Hematologic/Lymphatic Swollen glands?: No Easy bruising?: No  Cardiovascular Leg swelling?: No Chest pain?: No  Respiratory Cough?: No Shortness of breath?: No  Endocrine Excessive thirst?: No  Musculoskeletal Back pain?: No Joint pain?: No  Neurological Headaches?: No Dizziness?: No  Psychologic Depression?: No Anxiety?: No  Physical Exam: BP 140/72   Pulse 76   Ht _0  (1.499 m)   Wt 161 lb (73 kg)   BMI 32.52 kg/m   Constitutional:  Well nourished. Alert and oriented, No acute distress. HEENT: [place] AT, moist mucus membranes.  Trachea midline, no masses. Cardiovascular: No clubbing, cyanosis, or edema. Respiratory: Normal respiratory effort, no  increased work of breathing. Neurologic: Grossly intact, no focal deficits, moving all 4 extremities. Psychiatric: Normal mood and affect.   Laboratory Data: Lab Results  Component Value Date   WBC 7.7 04/21/2018   HGB 12.6 04/21/2018   HCT 39.6 04/21/2018   MCV 75 (L) 04/21/2018   PLT 307 04/21/2018    Lab Results  Component Value Date   CREATININE 0.63 09/19/2018    No results found for: PSA  No results found for: TESTOSTERONE  Lab Results  Component Value Date   HGBA1C 7.1 (H) 09/19/2018    Lab Results  Component Value Date   TSH 4.460 01/14/2018       Component Value Date/Time   CHOL 141 01/14/2018 0914   HDL 61 01/14/2018  0914   CHOLHDL 2.3 01/14/2018 0914   LDLCALC 62 01/14/2018 0914    Lab Results  Component Value Date   AST 15 04/21/2018   Lab Results  Component Value Date   ALT 20 04/21/2018   No components found for: ALKALINEPHOPHATASE No components found for: BILIRUBINTOTAL  No results found for: ESTRADIOL  Urinalysis Negative  See Epic. I have reviewed the labs.  Assessment & Plan:    1. History of rUTI's Asymptomatic   2. History of hematuria (high risk) Hematuria work up completed in 07/2017 - findings positive for fibroids No report of gross hematuria  UA today is negative for hematuria RTC in one year for UA - patient to report any gross hematuria in the interim    3. Frequency Using Attain bladder control device with good results - she used it daily once daily for one month and now she is using it three times weekly     Return in about 1 year (around 11/24/2019) for UA and symptom recheck .  These notes generated with voice recognition software. I apologize for typographical errors.  Zara Council, PA-C  [facility] [address]  [place], [place] [address] [phone]

## 2018-11-24 ENCOUNTER — Encounter: Payer: Self-pay | Admitting: Urology

## 2018-11-24 ENCOUNTER — Ambulatory Visit (INDEPENDENT_AMBULATORY_CARE_PROVIDER_SITE_OTHER): Payer: Medicare Other | Admitting: Urology

## 2018-11-24 ENCOUNTER — Other Ambulatory Visit: Payer: Self-pay

## 2018-11-24 ENCOUNTER — Other Ambulatory Visit
Admission: RE | Admit: 2018-11-24 | Discharge: 2018-11-24 | Disposition: A | Discharge status: Home / Self Care | Payer: Medicare Other | Attending: Urology | Admitting: Urology

## 2018-11-24 VITALS — BP 140/72 | HR 76 | Ht 59.0 in | Wt 161.0 lb

## 2018-11-24 DIAGNOSIS — E1165 Type 2 diabetes mellitus with hyperglycemia: Secondary | ICD-10-CM | POA: Diagnosis not present

## 2018-11-24 DIAGNOSIS — Z87448 Personal history of other diseases of urinary system: Secondary | ICD-10-CM

## 2018-11-24 DIAGNOSIS — M19019 Primary osteoarthritis, unspecified shoulder: Secondary | ICD-10-CM | POA: Diagnosis not present

## 2018-11-24 DIAGNOSIS — Z87440 Personal history of urinary (tract) infections: Secondary | ICD-10-CM

## 2018-11-24 DIAGNOSIS — R350 Frequency of micturition: Secondary | ICD-10-CM | POA: Insufficient documentation

## 2018-11-24 DIAGNOSIS — I69354 Hemiplegia and hemiparesis following cerebral infarction affecting left non-dominant side: Secondary | ICD-10-CM | POA: Diagnosis not present

## 2018-11-24 DIAGNOSIS — R269 Unspecified abnormalities of gait and mobility: Secondary | ICD-10-CM | POA: Diagnosis not present

## 2018-11-24 LAB — URINALYSIS, COMPLETE (UACMP) WITH MICROSCOPIC
Bilirubin Urine: NEGATIVE
Glucose, UA: NEGATIVE mg/dL
Ketones, ur: NEGATIVE mg/dL
Leukocytes,Ua: NEGATIVE
Nitrite: NEGATIVE
Protein, ur: NEGATIVE mg/dL
RBC / HPF: NONE SEEN RBC/hpf (ref 0–5)
Specific Gravity, Urine: 1.015 (ref 1.005–1.030)
pH: 7.5 (ref 5.0–8.0)

## 2018-12-25 DIAGNOSIS — I69354 Hemiplegia and hemiparesis following cerebral infarction affecting left non-dominant side: Secondary | ICD-10-CM | POA: Diagnosis not present

## 2018-12-25 DIAGNOSIS — R269 Unspecified abnormalities of gait and mobility: Secondary | ICD-10-CM | POA: Diagnosis not present

## 2018-12-25 DIAGNOSIS — M19019 Primary osteoarthritis, unspecified shoulder: Secondary | ICD-10-CM | POA: Diagnosis not present

## 2018-12-25 DIAGNOSIS — E1165 Type 2 diabetes mellitus with hyperglycemia: Secondary | ICD-10-CM | POA: Diagnosis not present

## 2019-01-12 ENCOUNTER — Ambulatory Visit (INDEPENDENT_AMBULATORY_CARE_PROVIDER_SITE_OTHER): Payer: Medicare Other

## 2019-01-12 ENCOUNTER — Other Ambulatory Visit: Payer: Self-pay

## 2019-01-12 VITALS — BP 128/82 | HR 74 | Temp 98.1°F | Resp 16 | Ht 59.0 in | Wt 163.0 lb

## 2019-01-12 DIAGNOSIS — Z0000 Encounter for general adult medical examination without abnormal findings: Secondary | ICD-10-CM

## 2019-01-12 NOTE — Progress Notes (Addendum)
Subjective:   Sharon York is a 73 y.o. female who presents for Medicare Annual (Subsequent) preventive examination.  Review of Systems:   Cardiac Risk Factors include: advanced age (>34mn, >>29women);diabetes mellitus;obesity (BMI >30kg/m2);dyslipidemia     Objective:     Vitals: BP 128/82 (BP Location: Right Arm, Patient Position: Sitting, Cuff Size: Normal)   Pulse 74   Temp 98.1 F (36.7 C) (Oral)   Resp 16   Ht '4\' 11"'$  (1.499 m)   Wt 163 lb (73.9 kg)   SpO2 97%   BMI 32.92 kg/m   Body mass index is 32.92 kg/m.  Advanced Directives 01/12/2019 09/10/2018 03/18/2018 03/11/2018 01/08/2018 08/15/2017 07/19/2017  Does Patient Have a Medical Advance Directive? Yes No Yes Yes No;Yes Yes No  Type of AParamedicof [place]Living will - - - [place] - -  Does patient want to make changes to medical advance directive? - - - No - Patient declined - No - Patient declined -  Copy of HWorthamin Chart? No - copy requested - - - No - copy requested - -  Would patient like information on creating a medical advance directive? - - - - - - No - Patient declined    Tobacco Social History   Tobacco Use  Smoking Status Never Smoker  Smokeless Tobacco Never Used  Tobacco Comment   smoking cessation materials not required     Counseling given: Not Answered Comment: smoking cessation materials not required   Clinical Intake:  Pre-visit preparation completed: Yes  Pain : No/denies pain     BMI - recorded: 32.92 Nutritional Status: BMI > 30  Obese Nutritional Risks: None Diabetes: Yes CBG done?: No Did pt. bring in CBG monitor from home?: No   Nutrition Risk Assessment:  Has the patient had any N/V/D within the last 2 months?  No  Does the patient have any non-healing wounds?  No  Has the patient had any unintentional weight loss or weight gain?  No   Diabetes:  Is the patient diabetic?  Yes  If  diabetic, was a CBG obtained today?  No  Did the patient bring in their glucometer from home?  No  How often do you monitor your CBG's? Twice daily.   Financial Strains and Diabetes Management:  Are you having any financial strains with the device, your supplies or your medication? No .  Does the patient want to be seen by Chronic Care Management for management of their diabetes?  No  Would the patient like to be referred to a Nutritionist or for Diabetic Management?  No   Diabetic Exams:  Diabetic Eye Exam: Completed 02/17/18 negative retinopathy.  Diabetic Foot Exam: Completed 01/14/18. Pt has been advised about the importance in completing this exam. Pt is scheduled for diabetic foot exam on 01/16/19.   How often do you need to have someone help you when you read instructions, pamphlets, or other written materials from your doctor or pharmacy?: 1 - Never  Interpreter Needed?: No  Information entered by :: KClemetine MarkerLPN  Past Medical History:  Diagnosis Date  . Absence of bladder continence 12/15/2014  . Blood pressure elevated without history of HTN 12/15/2014  . Diabetes mellitus without complication ([place]   . Hyperlipidemia   . Osteoporosis   . Overactive bladder   . 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  CESAREAN SECTION  1975  . COLONOSCOPY  2010   normal   Family History  Problem Relation Age of Onset  . Diabetes Mother   . Thyroid disease Mother   . Breast cancer Maternal Aunt   . Breast cancer Cousin        mat cousin  . Prostate cancer Brother   . Kidney failure Brother   . Kidney cancer Son   . Bladder Cancer Neg Hx    Social History   Socioeconomic History  . Marital status: Widowed    Spouse name: Not on file  . Number of children: 5  . Years of education: Not on file  . Highest education level: 8th grade  Occupational History  . Occupation: Retired  Scientific laboratory technician  . Financial resource  strain: Not hard at all  . Food insecurity    Worry: Never true    Inability: Never true  . Transportation needs    Medical: No    Non-medical: No  Tobacco Use  . Smoking status: Never Smoker  . Smokeless tobacco: Never Used  . Tobacco comment: smoking cessation materials not required  Substance and Sexual Activity  . Alcohol use: No    Alcohol/week: 0.0 standard drinks  . Drug use: Never  . Sexual activity: Not Currently  Lifestyle  . Physical activity    Days per week: 0 days    Minutes per session: 0 min  . Stress: Only a little  Relationships  . Social connections    Talks on phone: More than three times a week    Gets together: More than three times a week    Attends religious service: More than 4 times per year    Active member of club or organization: No    Attends meetings of clubs or organizations: Never    Relationship status: Widowed  Other Topics Concern  . Not on file  Social History Narrative   Pt lives with her daughter    Outpatient Encounter Medications as of 01/12/2019  Medication Sig  . ACCU-CHEK AVIVA PLUS test strip  USE 1 TEST STRIP UP TO 4 TIMES DAILY  . ACCU-CHEK FASTCLIX LANCETS MISC 1 each by Does not apply route 2 (two) times daily.  Marland Kitchen alendronate (FOSAMAX) 70 MG tablet TAKE 1 TABLET BY MOUTH ONCE A WEEK TAKE  WITH  A  FULL  GLASS  OF  WATER  ON  AN  EMPTY  STOMACH  . aspirin 81 MG chewable tablet Chew 1 tablet by mouth daily.  . baclofen (LIORESAL) 10 MG tablet Take 1 tablet (10 mg total) by mouth 2 (two) times daily.  . blood glucose meter kit and supplies KIT Dispense based on patient and insurance preference. Use up to 3 times daily as directed to check Blood Sugar for ICD10: E11.69 Uncontrolled DM2  . Calcium Carbonate-Vitamin D (CALCIUM 500 + D) 500-125 MG-UNIT TABS Take 1 tablet by mouth daily.   . metFORMIN (GLUCOPHAGE-XR) 500 MG 24 hr tablet Take 1 tablet (500 mg total) by mouth daily with breakfast.  . Multiple Vitamins-Minerals  (MULTIVITAMIN ADULT PO) Take by mouth.  Marland Kitchen atorvastatin (LIPITOR) 10 MG tablet Take 1 tablet (10 mg total) by mouth daily. (Patient not taking: Reported on 01/12/2019)  . [DISCONTINUED] Continuous Blood Gluc Receiver (FREESTYLE LIBRE READER) DEVI 1 each by Does not apply route 2 (two) times daily.  . [DISCONTINUED] Continuous Blood Gluc Sensor (FREESTYLE LIBRE 14 DAY SENSOR) MISC Apply 1 each topically as needed.  . [  DISCONTINUED] glucose blood test strip Dispense Strips compatable to insurance and meter covered in order to check BS up to 3 times daily  for ICD 10 E11.69  . [DISCONTINUED] Homeopathic Products (LIVER SUPPORT SL) Place under the tongue.  . [DISCONTINUED] Lancets MISC Use brand compatible to insurance and meter... to check BS up to 3 times daily for ICD10  E11.69.   No facility-administered encounter medications on file as of 01/12/2019.     Activities of Daily Living In your present state of health, do you have any difficulty performing the following activities: 01/12/2019 10/29/2018  Hearing? Tempie Donning  Comment has hearing aids -  Vision? N N  Comment wears glasses -  Difficulty concentrating or making decisions? N N  Walking or climbing stairs? Y Y  Dressing or bathing? N N  Doing errands, shopping? N Y  Conservation officer, nature and eating ? N N  Using the Toilet? N N  In the past six months, have you accidently leaked urine? Y Y  Do you have problems with loss of bowel control? N N  Managing your Medications? N N  Managing your Finances? N N  Housekeeping or managing your Housekeeping? N Y  Some recent data might be hidden    Patient Care Team: [name], MD as PCP - General (Internal Medicine) [facility] as Consulting Physician (Ophthalmology)    Assessment:   This is a routine wellness examination for [place].  Exercise Activities and Dietary recommendations Current Exercise Habits: The patient does not participate in regular exercise at present, Exercise limited by:  orthopedic condition(s);neurologic condition(s)  Goals    . DIET - INCREASE WATER INTAKE     Recommend to drink at least 6-8 8oz glasses of water per day.       Fall Risk Fall Risk  01/12/2019 10/29/2018 09/19/2018 07/10/2018 06/06/2018  Falls in the past year? 0 0 1 0 0  Comment - - - - -  Number falls in past yr: 0 - 0 0 -  Injury with Fall? 0 - 0 - -  Risk Factor Category  - - - - -  Risk for fall due to : Impaired balance/gait;Impaired mobility Impaired balance/gait;Impaired mobility History of fall(s);Impaired balance/gait;Impaired mobility;Impaired vision History of fall(s);Impaired balance/gait;Impaired mobility;Medication side effect Impaired balance/gait;Impaired mobility  Risk for fall due to: Comment - - - - -  Follow up Falls prevention discussed Falls evaluation completed;Falls prevention discussed - Falls evaluation completed;Falls prevention discussed Falls evaluation completed   FALL RISK PREVENTION PERTAINING TO THE HOME:  Any stairs in or around the home? Yes  If so, do they handrails? No  awaiting habitat for humanity to build hand rails on front steps  Home free of loose throw rugs in walkways, pet beds, electrical cords, etc? Yes  Adequate lighting in your home to reduce risk of falls? Yes   ASSISTIVE DEVICES UTILIZED TO PREVENT FALLS:  Life alert? Yes  Use of a cane, walker or w/c? Yes  Grab bars in the bathroom? Yes  Shower chair or bench in shower? Yes  Elevated toilet seat or a handicapped toilet? Yes   DME ORDERS:  DME order needed?  No   TIMED UP AND GO:  Was the test performed? Yes .  Length of time to ambulate 10 feet: 9 sec.   GAIT:  Appearance of gait: Gait slow, steady and with the use of an assistive device.   Education: Fall risk prevention has been discussed.  Intervention(s) required? No  Depression Screen PHQ 2/9 Scores 01/12/2019 10/29/2018 09/19/2018 07/10/2018  PHQ - 2 Score 0 0 0 0  PHQ- 9 Score 2 - - -     Cognitive Function      6CIT Screen 01/12/2019 01/08/2018 01/07/2017  What Year? 0 points 0 points 0 points  What month? 0 points 0 points 0 points  What time? 0 points 0 points 0 points  Count back from 20 0 points 0 points 0 points  Months in reverse 0 points 0 points 0 points  Repeat phrase 2 points 0 points 0 points  Total Score 2 0 0    Immunization History  Administered Date(s) Administered  . Influenza, High Dose Seasonal PF 02/06/2017, 01/22/2018  . Influenza,inj,Quad PF,6+ Mos 02/18/2015  . Pneumococcal Conjugate-13 06/22/2016  . Pneumococcal Polysaccharide-23 06/11/2017  . Tdap 07/10/2018    Qualifies for Shingles Vaccine? Yes . Due for Shingrix. Education has been provided regarding the importance of this vaccine. Pt has been advised to call insurance company to determine out of pocket expense. Advised may also receive vaccine at local pharmacy or Health Dept. Verbalized acceptance and understanding.  Tdap: Up to date  Flu Vaccine: Up to date  Pneumococcal Vaccine: Up to date    Screening Tests Health Maintenance  Topic Date Due  . MAMMOGRAM  12/12/2018  . INFLUENZA VACCINE  12/20/2018  . FOOT EXAM  01/15/2019  . OPHTHALMOLOGY EXAM  02/18/2019  . HEMOGLOBIN A1C  03/22/2019  . URINE MICROALBUMIN  06/07/2019  . TETANUS/TDAP  07/10/2028  . COLONOSCOPY  10/02/2028  . DEXA SCAN  Completed  . Hepatitis C Screening  Completed  . PNA vac Low Risk Adult  Completed    Cancer Screenings:  Colorectal Screening: Completed 10/03/18. Repeat every 10 years  Mammogram: Completed 12/11/17. Repeat every year. Ordered 09/19/18. Pt provided with contact information and advised to call to schedule appt.   Bone Density: Completed 02/04/18. Results reflect  OSTEOPENIA. Repeat every 2 years.   Lung Cancer Screening: (Low Dose CT Chest recommended if Age 37-80 years, 30 pack-year currently smoking OR have quit w/in 15years.) does not qualify.    Additional Screening:  Hepatitis C Screening: does  qualify; Completed 06/11/17  Vision Screening: Recommended annual ophthalmology exams for early detection of glaucoma and other disorders of the eye. Is the patient up to date with their annual eye exam?  Yes  Who is the provider or what is the name of the office in which the pt attends annual eye exams? [facility]  Dental Screening: Recommended annual dental exams for proper oral hygiene  Community Resource Referral:  CRR required this visit?  No      Plan:    I have personally reviewed and addressed the Medicare Annual Wellness questionnaire and have noted the following in the patient's chart:  A. Medical and social history B. Use of alcohol, tobacco or illicit drugs  C. Current medications and supplements D. Functional ability and status E.  Nutritional status F.  Physical activity G. Advance directives H. List of other physicians I.  Hospitalizations, surgeries, and ER visits in previous 12 months J.  [place] such as hearing and vision if needed, cognitive and depression L. Referrals and appointments   In addition, I have reviewed and discussed with patient certain preventive protocols, quality metrics, and best practice recommendations. A written personalized care plan for preventive services as well as general preventive health recommendations were provided to patient.   Signed,  Clemetine Marker,  LPN Nurse Health Advisor   Nurse Notes: pt c/o urinary urgency and incontinence. Denies burning or pain. Advised to follow up with urology, pt states they have recommended medications but she does not want to take any additional pills. Pt also scheduled for CPE later this week.

## 2019-01-12 NOTE — Patient Instructions (Addendum)
Sharon York , Thank you for taking time to come for your Medicare Wellness Visit. I appreciate your ongoing commitment to your health goals. Please review the following plan we discussed and let me know if I can assist you in the future.   Screening recommendations/referrals: Colonoscopy: virtual colonoscopy 10/03/18 Mammogram: done 12/11/17. Please call [phone] to schedule your mammogram.  Bone Density: done 02/04/18 Recommended yearly ophthalmology/optometry visit for glaucoma screening and checkup Recommended yearly dental visit for hygiene and checkup  Vaccinations: Influenza vaccine: done 01/22/18 Pneumococcal vaccine: done 06/11/17 Tdap vaccine: done 07/10/18 Shingles vaccine: Shingrix discussed. Please contact your pharmacy for coverage information.   Advanced directives: Please bring a copy of your health care power of attorney and living will to the office at your convenience.  Conditions/risks identified: Recommend healthy eating and physical activity to lower A1c  Next appointment: Please follow up in one year for your Medicare Annual Wellness visit.     Preventive Care 75 Years and Older, Female Preventive care refers to lifestyle choices and visits with your health care provider that can promote health and wellness. What does preventive care include?  A yearly physical exam. This is also called an annual well check.  Dental exams once or twice a year.  Routine eye exams. Ask your health care provider how often you should have your eyes checked.  Personal lifestyle choices, including:  Daily care of your teeth and gums.  Regular physical activity.  Eating a healthy diet.  Avoiding tobacco and drug use.  Limiting alcohol use.  Practicing safe sex.  Taking low-dose aspirin every day.  Taking vitamin and mineral supplements as recommended by your health care provider. What happens during an annual well check? The services and screenings done by your health  care provider during your annual well check will depend on your age, overall health, lifestyle risk factors, and family history of disease. Counseling  Your health care provider may ask you questions about your:  Alcohol use.  Tobacco use.  Drug use.  Emotional well-being.  Home and relationship well-being.  Sexual activity.  Eating habits.  History of falls.  Memory and ability to understand (cognition).  Work and work Statistician.  Reproductive health. Screening  You may have the following tests or measurements:  Height, weight, and BMI.  Blood pressure.  Lipid and cholesterol levels. These may be checked every 5 years, or more frequently if you are over 76 years old.  Skin check.  Lung cancer screening. You may have this screening every year starting at age 10 if you have a 30-pack-year history of smoking and currently smoke or have quit within the past 15 years.  Fecal occult blood test (FOBT) of the stool. You may have this test every year starting at age 61.  Flexible sigmoidoscopy or colonoscopy. You may have a sigmoidoscopy every 5 years or a colonoscopy every 10 years starting at age 5.  Hepatitis C blood test.  Hepatitis B blood test.  Sexually transmitted disease (STD) testing.  Diabetes screening. This is done by checking your blood sugar (glucose) after you have not eaten for a while (fasting). You may have this done every 1-3 years.  Bone density scan. This is done to screen for osteoporosis. You may have this done starting at age 49.  Mammogram. This may be done every 1-2 years. Talk to your health care provider about how often you should have regular mammograms. Talk with your health care provider about your test results, treatment options, and  if necessary, the need for more tests. Vaccines  Your health care provider may recommend certain vaccines, such as:  Influenza vaccine. This is recommended every year.  Tetanus, diphtheria, and  acellular pertussis (Tdap, Td) vaccine. You may need a Td booster every 10 years.  Zoster vaccine. You may need this after age 75.  Pneumococcal 13-valent conjugate (PCV13) vaccine. One dose is recommended after age 49.  Pneumococcal polysaccharide (PPSV23) vaccine. One dose is recommended after age 30. Talk to your health care provider about which screenings and vaccines you need and how often you need them. This information is not intended to replace advice given to you by your health care provider. Make sure you discuss any questions you have with your health care provider. Document Released: 06/03/2015 Document Revised: 01/25/2016 Document Reviewed: 03/08/2015 Elsevier Interactive Patient Education  2017 [place] Prevention in the Home Falls can cause injuries. They can happen to people of all ages. There are many things you can do to make your home safe and to help prevent falls. What can I do on the outside of my home?  Regularly fix the edges of walkways and driveways and fix any cracks.  Remove anything that might make you trip as you walk through a door, such as a raised step or threshold.  Trim any bushes or trees on the path to your home.  Use bright outdoor lighting.  Clear any walking paths of anything that might make someone trip, such as rocks or tools.  Regularly check to see if handrails are loose or broken. Make sure that both sides of any steps have handrails.  Any raised decks and porches should have guardrails on the edges.  Have any leaves, snow, or ice cleared regularly.  Use sand or salt on walking paths during winter.  Clean up any spills in your garage right away. This includes oil or grease spills. What can I do in the bathroom?  Use night lights.  Install grab bars by the toilet and in the tub and shower. Do not use towel bars as grab bars.  Use non-skid mats or decals in the tub or shower.  If you need to sit down in the shower, use  a plastic, non-slip stool.  Keep the floor dry. Clean up any water that spills on the floor as soon as it happens.  Remove soap buildup in the tub or shower regularly.  Attach bath mats securely with double-sided non-slip rug tape.  Do not have throw rugs and other things on the floor that can make you trip. What can I do in the bedroom?  Use night lights.  Make sure that you have a light by your bed that is easy to reach.  Do not use any sheets or blankets that are too big for your bed. They should not hang down onto the floor.  Have a firm chair that has side arms. You can use this for support while you get dressed.  Do not have throw rugs and other things on the floor that can make you trip. What can I do in the kitchen?  Clean up any spills right away.  Avoid walking on wet floors.  Keep items that you use a lot in easy-to-reach places.  If you need to reach something above you, use a strong step stool that has a grab bar.  Keep electrical cords out of the way.  Do not use floor polish or wax that makes floors slippery. If you  must use wax, use non-skid floor wax.  Do not have throw rugs and other things on the floor that can make you trip. What can I do with my stairs?  Do not leave any items on the stairs.  Make sure that there are handrails on both sides of the stairs and use them. Fix handrails that are broken or loose. Make sure that handrails are as long as the stairways.  Check any carpeting to make sure that it is firmly attached to the stairs. Fix any carpet that is loose or worn.  Avoid having throw rugs at the top or bottom of the stairs. If you do have throw rugs, attach them to the floor with carpet tape.  Make sure that you have a light switch at the top of the stairs and the bottom of the stairs. If you do not have them, ask someone to add them for you. What else can I do to help prevent falls?  Wear shoes that:  Do not have high heels.  Have  rubber bottoms.  Are comfortable and fit you well.  Are closed at the toe. Do not wear sandals.  If you use a stepladder:  Make sure that it is fully opened. Do not climb a closed stepladder.  Make sure that both sides of the stepladder are locked into place.  Ask someone to hold it for you, if possible.  Clearly mark and make sure that you can see:  Any grab bars or handrails.  First and last steps.  Where the edge of each step is.  Use tools that help you move around (mobility aids) if they are needed. These include:  Canes.  Walkers.  Scooters.  Crutches.  Turn on the lights when you go into a dark area. Replace any light bulbs as soon as they burn out.  Set up your furniture so you have a clear path. Avoid moving your furniture around.  If any of your floors are uneven, fix them.  If there are any pets around you, be aware of where they are.  Review your medicines with your doctor. Some medicines can make you feel dizzy. This can increase your chance of falling. Ask your doctor what other things that you can do to help prevent falls. This information is not intended to replace advice given to you by your health care provider. Make sure you discuss any questions you have with your health care provider. Document Released: 03/03/2009 Document Revised: 10/13/2015 Document Reviewed: 06/11/2014 Elsevier Interactive Patient Education  2017 Elsevier Inc.    Diabetes Mellitus and Nutrition, Adult When you have diabetes (diabetes mellitus), it is very important to have healthy eating habits because your blood sugar (glucose) levels are greatly affected by what you eat and drink. Eating healthy foods in the appropriate amounts, at about the same times every day, can help you:  Control your blood glucose.  Lower your risk of heart disease.  Improve your blood pressure.  Reach or maintain a healthy weight. Every person with diabetes is different, and each person has  different needs for a meal plan. Your health care provider may recommend that you work with a diet and nutrition specialist (dietitian) to make a meal plan that is best for you. Your meal plan may vary depending on factors such as:  The calories you need.  The medicines you take.  Your weight.  Your blood glucose, blood pressure, and cholesterol levels.  Your activity level.  Other health conditions you  have, such as heart or kidney disease. How do carbohydrates affect me? Carbohydrates, also called carbs, affect your blood glucose level more than any other type of food. Eating carbs naturally raises the amount of glucose in your blood. Carb counting is a method for keeping track of how many carbs you eat. Counting carbs is important to keep your blood glucose at a healthy level, especially if you use insulin or take certain oral diabetes medicines. It is important to know how many carbs you can safely have in each meal. This is different for every person. Your dietitian can help you calculate how many carbs you should have at each meal and for each snack. Foods that contain carbs include:  Bread, cereal, rice, pasta, and crackers.  Potatoes and corn.  Peas, beans, and lentils.  Milk and yogurt.  Fruit and juice.  Desserts, such as cakes, cookies, ice cream, and candy. How does alcohol affect me? Alcohol can cause a sudden decrease in blood glucose (hypoglycemia), especially if you use insulin or take certain oral diabetes medicines. Hypoglycemia can be a life-threatening condition. Symptoms of hypoglycemia (sleepiness, dizziness, and confusion) are similar to symptoms of having too much alcohol. If your health care provider says that alcohol is safe for you, follow these guidelines:  Limit alcohol intake to no more than 1 drink per day for nonpregnant women and 2 drinks per day for men. One drink equals 12 oz of beer, 5 oz of wine, or 1 oz of hard liquor.  Do not drink on an  empty stomach.  Keep yourself hydrated with water, diet soda, or unsweetened iced tea.  Keep in mind that regular soda, juice, and other mixers may contain a lot of sugar and must be counted as carbs. What are tips for following this plan?  Reading food labels  Start by checking the serving size on the "Nutrition Facts" label of packaged foods and drinks. The amount of calories, carbs, fats, and other nutrients listed on the label is based on one serving of the item. Many items contain more than one serving per package.  Check the total grams (g) of carbs in one serving. You can calculate the number of servings of carbs in one serving by dividing the total carbs by 15. For example, if a food has 30 g of total carbs, it would be equal to 2 servings of carbs.  Check the number of grams (g) of saturated and trans fats in one serving. Choose foods that have low or no amount of these fats.  Check the number of milligrams (mg) of salt (sodium) in one serving. Most people should limit total sodium intake to less than 2,300 mg per day.  Always check the nutrition information of foods labeled as "low-fat" or "nonfat". These foods may be higher in added sugar or refined carbs and should be avoided.  Talk to your dietitian to identify your daily goals for nutrients listed on the label. Shopping  Avoid buying canned, premade, or processed foods. These foods tend to be high in fat, sodium, and added sugar.  Shop around the outside edge of the grocery store. This includes fresh fruits and vegetables, bulk grains, fresh meats, and fresh dairy. Cooking  Use low-heat cooking methods, such as baking, instead of high-heat cooking methods like deep frying.  Cook using healthy oils, such as olive, canola, or sunflower oil.  Avoid cooking with butter, cream, or high-fat meats. Meal planning  Eat meals and snacks regularly, preferably at the  same times every day. Avoid going long periods of time without  eating.  Eat foods high in fiber, such as fresh fruits, vegetables, beans, and whole grains. Talk to your dietitian about how many servings of carbs you can eat at each meal.  Eat 4-6 ounces (oz) of lean protein each day, such as lean meat, chicken, fish, eggs, or tofu. One oz of lean protein is equal to: ? 1 oz of meat, chicken, or fish. ? 1 egg. ?  cup of tofu.  Eat some foods each day that contain healthy fats, such as avocado, nuts, seeds, and fish. Lifestyle  Check your blood glucose regularly.  Exercise regularly as told by your health care provider. This may include: ? 150 minutes of moderate-intensity or vigorous-intensity exercise each week. This could be brisk walking, biking, or water aerobics. ? Stretching and doing strength exercises, such as yoga or weightlifting, at least 2 times a week.  Take medicines as told by your health care provider.  Do not use any products that contain nicotine or tobacco, such as cigarettes and e-cigarettes. If you need help quitting, ask your health care provider.  Work with a Veterinary surgeoncounselor or diabetes educator to identify strategies to manage stress and any emotional and social challenges. Questions to ask a health care provider  Do I need to meet with a diabetes educator?  Do I need to meet with a dietitian?  What number can I call if I have questions?  When are the best times to check my blood glucose? Where to find more information:  American Diabetes Association: diabetes.org  Academy of Nutrition and Dietetics: www.eatright.[employer]  National Institute of Diabetes and Digestive and Kidney Diseases (NIH): Car[employer].tnwww.niddk.nih.gov Summary  A healthy meal plan will help you control your blood glucose and maintain a healthy lifestyle.  Working with a diet and nutrition specialist (dietitian) can help you make a meal plan that is best for you.  Keep in mind that carbohydrates (carbs) and alcohol have immediate effects on your blood glucose  levels. It is important to count carbs and to use alcohol carefully. This information is not intended to replace advice given to you by your health care provider. Make sure you discuss any questions you have with your health care provider. Document Released: 02/01/2005 Document Revised: 04/19/2017 Document Reviewed: 06/11/2016 Elsevier Patient Education  2020 [employer]

## 2019-01-16 ENCOUNTER — Other Ambulatory Visit: Payer: Self-pay | Admitting: *Deleted

## 2019-01-16 ENCOUNTER — Ambulatory Visit (INDEPENDENT_AMBULATORY_CARE_PROVIDER_SITE_OTHER): Payer: Medicare Other | Admitting: Internal Medicine

## 2019-01-16 ENCOUNTER — Telehealth: Payer: Self-pay | Admitting: Urology

## 2019-01-16 ENCOUNTER — Encounter: Payer: Self-pay | Admitting: Internal Medicine

## 2019-01-16 ENCOUNTER — Other Ambulatory Visit: Payer: Self-pay

## 2019-01-16 VITALS — BP 122/82 | HR 66 | Resp 16 | Ht 59.0 in | Wt 160.0 lb

## 2019-01-16 DIAGNOSIS — Z23 Encounter for immunization: Secondary | ICD-10-CM

## 2019-01-16 DIAGNOSIS — I69354 Hemiplegia and hemiparesis following cerebral infarction affecting left non-dominant side: Secondary | ICD-10-CM

## 2019-01-16 DIAGNOSIS — E118 Type 2 diabetes mellitus with unspecified complications: Secondary | ICD-10-CM | POA: Diagnosis not present

## 2019-01-16 DIAGNOSIS — Z0000 Encounter for general adult medical examination without abnormal findings: Secondary | ICD-10-CM

## 2019-01-16 DIAGNOSIS — Z1231 Encounter for screening mammogram for malignant neoplasm of breast: Secondary | ICD-10-CM

## 2019-01-16 DIAGNOSIS — E785 Hyperlipidemia, unspecified: Secondary | ICD-10-CM | POA: Diagnosis not present

## 2019-01-16 DIAGNOSIS — E1169 Type 2 diabetes mellitus with other specified complication: Secondary | ICD-10-CM | POA: Diagnosis not present

## 2019-01-16 LAB — POCT URINALYSIS DIPSTICK
Bilirubin, UA: NEGATIVE
Blood, UA: NEGATIVE
Glucose, UA: NEGATIVE
Ketones, UA: NEGATIVE
Leukocytes, UA: NEGATIVE
Nitrite, UA: NEGATIVE
Protein, UA: NEGATIVE
Spec Grav, UA: 1.015 (ref 1.010–1.025)
Urobilinogen, UA: 0.2 E.U./dL
pH, UA: 7.5 (ref 5.0–8.0)

## 2019-01-16 MED ORDER — BACLOFEN 10 MG PO TABS: 10.0000 mg | ORAL_TABLET | Freq: Two times a day (BID) | ORAL | 12 refills | Status: DC

## 2019-01-16 NOTE — Patient Outreach (Signed)
[facility]) Care Management  01/16/2019   Sharon York [birth year] [phone]  RN Health Coach received  telephone call from patient.  [employer] compliance verified. Per patient she went to the Dr today. Her fasting blood sugar was 125. Patient received flu shot. Patient had A1C drawn but doesn't know the A1C yet. Patient is drinking more water that sodas and is watching diet closely. Per patient she loves fruits for snack but is only getting a small amount on her EBT. RN discussed the special program that is available for vegetables and fruits. Patient has an exercise routine of walking but stated she has not been exercising her arm and it is getting weaker. Patient has agreed to follow up outreach calls.   Current Medications:  Current Outpatient Medications  Medication Sig Dispense Refill  . ACCU-CHEK AVIVA PLUS test strip  USE 1 TEST STRIP UP TO 4 TIMES DAILY 400 each 3  . ACCU-CHEK FASTCLIX LANCETS MISC 1 each by Does not apply route 2 (two) times daily. 100 each 3  . alendronate (FOSAMAX) 70 MG tablet TAKE 1 TABLET BY MOUTH ONCE A WEEK TAKE  WITH  A  FULL  GLASS  OF  WATER  ON  AN  EMPTY  STOMACH 12 tablet 4  . aspirin 81 MG chewable tablet Chew 1 tablet by mouth daily.    Marland Kitchen atorvastatin (LIPITOR) 10 MG tablet Take 1 tablet (10 mg total) by mouth daily. 90 tablet 3  . baclofen (LIORESAL) 10 MG tablet Take 1 tablet (10 mg total) by mouth 2 (two) times daily. 60 tablet 12  . blood glucose meter kit and supplies KIT Dispense based on patient and insurance preference. Use up to 3 times daily as directed to check Blood Sugar for ICD10: E11.69 Uncontrolled DM2 1 each 0  . Calcium Carbonate-Vitamin D (CALCIUM 500 + D) 500-125 MG-UNIT TABS Take 1 tablet by mouth daily.     . metFORMIN (GLUCOPHAGE-XR) 500 MG 24 hr tablet Take 1 tablet (500 mg total) by mouth daily with breakfast. 90 tablet 1  . Multiple Vitamins-Minerals (MULTIVITAMIN ADULT PO) Take by mouth.    . NUTRITIONAL SUPP  - DIET AIDS PO Take by mouth. Diabetamin- Lowers sugar and cravings.     No current facility-administered medications for this visit.     Functional Status:  In your present state of health, do you have any difficulty performing the following activities: 01/12/2019 10/29/2018  Hearing? Tempie Donning  Comment has hearing aids -  Vision? N N  Comment wears glasses -  Difficulty concentrating or making decisions? N N  Walking or climbing stairs? Y Y  Dressing or bathing? N N  Doing errands, shopping? N Y  Conservation officer, nature and eating ? N N  Using the Toilet? N N  In the past six months, have you accidently leaked urine? Y Y  Do you have problems with loss of bowel control? N N  Managing your Medications? N N  Managing your Finances? N N  Housekeeping or managing your Housekeeping? N Y  Some recent data might be hidden    Fall/Depression Screening: Fall Risk  01/16/2019 01/12/2019 10/29/2018  Falls in the past year? 0 0 0  Comment - - -  Number falls in past yr: 0 0 -  Injury with Fall? 0 0 -  Risk Factor Category  - - -  Risk for fall due to : Impaired balance/gait;Impaired mobility Impaired balance/gait;Impaired mobility Impaired balance/gait;Impaired mobility  Risk for fall  due to: Comment - - -  Follow up Falls evaluation completed;Falls prevention discussed;Education provided Falls prevention discussed Falls evaluation completed;Falls prevention discussed   PHQ 2/9 Scores 01/16/2019 01/16/2019 01/12/2019 10/29/2018 09/19/2018 07/10/2018 06/06/2018  PHQ - 2 Score 0 0 0 0 0 0 0  PHQ- 9 Score - 0 2 - - - -   THN CM Care Plan Problem One     Most Recent Value  Care Plan Problem One  Knowledge Deficit in Self Management of Diabetes  Role Documenting the Problem One  [place] for Problem One  Active  THN Long Term Goal   Patient will see a decrease in A1C 7.0 within the next 90 days  Interventions for Problem One Long Term Goal  RN reiterates checking blood sugar. RN reiterates healthy  eating. RN will follow up further monitoring and discussion  Interventions for Short Term Goal #1  Patient is still looking into getting special attachements for scooter to increase mobility  THN CM Short Term Goal #2   Patient will report doing arm exercissed within the next 30 days  THN CM Short Term Goal #2 Start Date  01/16/19  Interventions for Short Term Goal #2  RN discussed keeping up with arm exercises. RN sent patient some easy passive exercises to try. RN will follow up with further discussion  THN CM Short Term Goal #3  Patient will identify foods that make up a healthy snack within the next 30 days  Interventions for Short Tern Goal #3  RN reiterated healthy snacks. RN discussed special program to help patient get fresh foods and vegetables. RN will follow up for further discussion  THN CM Short Term Goal #4  Patient will verbalize having a better understanding of healthy diabetic foods choices for dining out within the next 30 days  Interventions for Short Term Goal #4  RN discussed with patient about dining out for patient to share the information with daughter.  THN CM Short Term Goal #5   Patient will verbalize getting a pedometer within the next 30 days  Interventions for Short Term Goal #5  Patient will order pedometer with next order. RN will follow up for further disussion      Assessment:  FBS 125 PCP visit today Patient is drinking more water Patient got flu shot today Patient will continue to benefit from [place] telephonic outreach for education and support for diabetes self management.  Plan:  Patient will get pedometer with next catalog order RN discussed arm exercises RN sent educational material on passive arm exercises RN will follow up within the month of October  Sevin Farone [place] Care Management [phone]

## 2019-01-16 NOTE — Progress Notes (Signed)
Date:  01/16/2019   Name:  Sharon York   DOB:  [birth year]   MRN:  [id number]   Chief Complaint: Annual Exam (Breast Exam. ) and Diabetes (Foot Exam.) Sharon York is a 73 y.o. female who presents today for her Complete Annual Exam. She feels well. She reports exercising at home . She reports she is sleeping well.  She denies breast issues.  Mammogram  11/2017 - ordered Colonoscopy  09/2018 Immunizations - up to date  Diabetes She presents for her follow-up diabetic visit. She has type 2 diabetes mellitus. Her disease course has been stable. Pertinent negatives for hypoglycemia include no dizziness, headaches, nervousness/anxiousness or tremors. Associated symptoms include weakness. Pertinent negatives for diabetes include no chest pain, no fatigue, no polydipsia and no polyuria. Symptoms are stable. Current diabetic treatment includes oral agent (monotherapy). She is compliant with treatment all of the time. Her weight is stable. She is following a generally healthy diet. She participates in exercise daily. She monitors blood glucose at home 1-2 x per day. Her breakfast blood glucose is taken between 6-7 am. Her breakfast blood glucose range is generally 130-140 mg/dl. Her dinner blood glucose is taken between 6-7 pm. Her dinner blood glucose range is generally 90-110 mg/dl. An ACE inhibitor/angiotensin II receptor blocker is not being taken. Eye exam is current.  Hyperlipidemia This is a chronic problem. The problem is controlled. Pertinent negatives include no chest pain or shortness of breath. Current antihyperlipidemic treatment includes statins. The current treatment provides significant improvement of lipids.   Lab Results  Component Value Date   HGBA1C 7.1 (H) 09/19/2018   Lab Results  Component Value Date   CREATININE 0.63 09/19/2018   BUN 12 09/19/2018   NA 139 09/19/2018   K 4.0 09/19/2018   CL 98 09/19/2018   CO2 28 09/19/2018   Lab Results  Component Value Date   CHOL 141 01/14/2018   HDL 61 01/14/2018   LDLCALC 62 01/14/2018   TRIG 92 01/14/2018   CHOLHDL 2.3 01/14/2018     Review of Systems  Constitutional: Negative for chills, fatigue and fever.  HENT: Positive for hearing loss. Negative for congestion, tinnitus, trouble swallowing and voice change.   Eyes: Negative for visual disturbance.  Respiratory: Negative for cough, chest tightness, shortness of breath and wheezing.   Cardiovascular: Negative for chest pain, palpitations and leg swelling.  Gastrointestinal: Negative for abdominal pain, constipation, diarrhea and vomiting.  Endocrine: Negative for polydipsia and polyuria.  Genitourinary: Negative for dysuria, frequency, genital sores, vaginal bleeding and vaginal discharge.  Musculoskeletal: Positive for gait problem. Negative for arthralgias and joint swelling.  Skin: Negative for color change and rash.  Allergic/Immunologic: Negative for environmental allergies.  Neurological: Positive for weakness. Negative for dizziness, tremors, light-headedness and headaches.  Hematological: Negative for adenopathy. Does not bruise/bleed easily.  Psychiatric/Behavioral: Negative for dysphoric mood and sleep disturbance. The patient is not nervous/anxious.     Patient Active Problem List   Diagnosis Date Noted  . Type II diabetes mellitus with complication ([place]) 30/16/0109  . Right shoulder pain 03/31/2018  . Hyperlipidemia associated with type 2 diabetes mellitus ([place]) 06/12/2017  . Tinea corporis 03/17/2015  . Abnormal gait 12/15/2014  . Altered bowel function 12/15/2014  . Hemiparesis affecting left side as late effect of cerebrovascular accident (CVA) ([place]) 12/15/2014  . Obstructive sleep apnea of adult 12/15/2014  . Arthritis of shoulder region, degenerative 12/15/2014  . OP (osteoporosis) 12/15/2014  . Allergic rhinitis, seasonal 12/15/2014  .  Mixed incontinence 03/11/2013    Allergies  Allergen Reactions  . Nitrofurantoin Nausea  Only  . Iodinated Diagnostic Agents Hives and Cough    Patient developed a hive on her left lip after injection as well as some coughing.     Past Surgical History:  Procedure Laterality Date  . CESAREAN SECTION  1975  . COLONOSCOPY  2010   normal    Social History   Tobacco Use  . Smoking status: Never Smoker  . Smokeless tobacco: Never Used  . Tobacco comment: smoking cessation materials not required  Substance Use Topics  . Alcohol use: No    Alcohol/week: 0.0 standard drinks  . Drug use: Never     Medication list has been reviewed and updated.  Current Meds  Medication Sig  . ACCU-CHEK AVIVA PLUS test strip  USE 1 TEST STRIP UP TO 4 TIMES DAILY  . ACCU-CHEK FASTCLIX LANCETS MISC 1 each by Does not apply route 2 (two) times daily.  Marland Kitchen alendronate (FOSAMAX) 70 MG tablet TAKE 1 TABLET BY MOUTH ONCE A WEEK TAKE  WITH  A  FULL  GLASS  OF  WATER  ON  AN  EMPTY  STOMACH  . aspirin 81 MG chewable tablet Chew 1 tablet by mouth daily.  Marland Kitchen atorvastatin (LIPITOR) 10 MG tablet Take 1 tablet (10 mg total) by mouth daily.  . baclofen (LIORESAL) 10 MG tablet Take 1 tablet (10 mg total) by mouth 2 (two) times daily.  . blood glucose meter kit and supplies KIT Dispense based on patient and insurance preference. Use up to 3 times daily as directed to check Blood Sugar for ICD10: E11.69 Uncontrolled DM2  . Calcium Carbonate-Vitamin D (CALCIUM 500 + D) 500-125 MG-UNIT TABS Take 1 tablet by mouth daily.   . metFORMIN (GLUCOPHAGE-XR) 500 MG 24 hr tablet Take 1 tablet (500 mg total) by mouth daily with breakfast.  . Multiple Vitamins-Minerals (MULTIVITAMIN ADULT PO) Take by mouth.  . NUTRITIONAL SUPP - DIET AIDS PO Take by mouth. Diabetamin- Lowers sugar and cravings.    PHQ 2/9 Scores 01/16/2019 01/12/2019 10/29/2018 09/19/2018  PHQ - 2 Score 0 0 0 0  PHQ- 9 Score 0 2 - -    BP Readings from Last 3 Encounters:  01/16/19 122/82  01/12/19 128/82  11/24/18 140/72    Physical Exam Vitals  signs and nursing note reviewed.  Constitutional:      General: She is not in acute distress.    Appearance: She is well-developed.  HENT:     Head: Normocephalic and atraumatic.     Right Ear: Tympanic membrane and ear canal normal.     Left Ear: Tympanic membrane and ear canal normal.     Nose:     Right Sinus: No maxillary sinus tenderness.     Left Sinus: No maxillary sinus tenderness.  Eyes:     General: No scleral icterus.       Right eye: No discharge.        Left eye: No discharge.     Conjunctiva/sclera: Conjunctivae normal.  Neck:     Musculoskeletal: Normal range of motion. No erythema.     Thyroid: No thyromegaly.     Vascular: No carotid bruit.  Cardiovascular:     Rate and Rhythm: Normal rate and regular rhythm.     Pulses: Normal pulses.     Heart sounds: Normal heart sounds.  Pulmonary:     Effort: Pulmonary effort is normal. No respiratory distress.  Breath sounds: No wheezing.  Abdominal:     General: Bowel sounds are normal.     Palpations: Abdomen is soft.     Tenderness: There is no abdominal tenderness.  Musculoskeletal:     Right lower leg: No edema.     Left lower leg: No edema.  Lymphadenopathy:     Cervical: No cervical adenopathy.  Skin:    General: Skin is warm and dry.     Findings: No rash.  Neurological:     Mental Status: She is alert and oriented to person, place, and time.     Cranial Nerves: No cranial nerve deficit.     Sensory: No sensory deficit.     Motor: Abnormal muscle tone present. No tremor.     Gait: Gait abnormal.     Comments: Dense left hemiparesis UE with increased muscle tone and spasm of forearm muscles Weakness LLE with foot drop  Psychiatric:        Attention and Perception: Attention normal.        Mood and Affect: Mood normal.        Speech: Speech normal.        Behavior: Behavior normal.        Thought Content: Thought content normal.        Cognition and Memory: Cognition normal.     Wt Readings from  Last 3 Encounters:  01/16/19 160 lb (72.6 kg)  01/12/19 163 lb (73.9 kg)  11/24/18 161 lb (73 kg)    BP 122/82   Pulse 66   Resp 16   Ht _0  (1.499 m)   Wt 160 lb (72.6 kg)   SpO2 96%   BMI 32.32 kg/m   Assessment and Plan: 1. Annual physical exam Pt is up to date on HM - POCT urinalysis dipstick  2. Encounter for screening mammogram for breast cancer Pt will call to schedule  3. Type II diabetes mellitus with complication (HCC) Clinically stable by exam and report without s/s of hypoglycemia. DM complicated by CVA and hyperlipidemia. Tolerating medications metformin 500 mg well without side effects or other concerns. - CBC with Differential/Platelet - Comprehensive metabolic panel - Hemoglobin A1c - TSH  4. Hyperlipidemia associated with type 2 diabetes mellitus ([place]) Tolerating statin medication without side effects at this time LDL is at goal of < 70 on current dose of lipitor 10 mg. Continue same therapy without change at this time. - Lipid panel  5. Hemiparesis affecting left side as late effect of cerebrovascular accident (CVA) ([place]) Stable left sided weakness with increased muscle tone Ambulating with walker or scooter for longer distances Continue aspirin therapy for secondary prevention and baclofen for spasms - baclofen (LIORESAL) 10 MG tablet; Take 1 tablet (10 mg total) by mouth 2 (two) times daily.  Dispense: 60 tablet; Refill: 12  6. Need for influenza vaccination - Flu Vaccine QUAD High Dose(Fluad)   Partially dictated using Editor, commissioning. Any errors are unintentional.  Halina Maidens, MD [facility]  01/16/2019

## 2019-01-17 LAB — COMPREHENSIVE METABOLIC PANEL
ALT: 17 IU/L (ref 0–32)
AST: 21 IU/L (ref 0–40)
Albumin/Globulin Ratio: 1.4 (ref 1.2–2.2)
Albumin: 4.3 g/dL (ref 3.7–4.7)
Alkaline Phosphatase: 48 IU/L (ref 39–117)
BUN/Creatinine Ratio: 21 (ref 12–28)
BUN: 14 mg/dL (ref 8–27)
Bilirubin Total: 0.3 mg/dL (ref 0.0–1.2)
CO2: 25 mmol/L (ref 20–29)
Calcium: 9.8 mg/dL (ref 8.7–10.3)
Chloride: 102 mmol/L (ref 96–106)
Creatinine, Ser: 0.66 mg/dL (ref 0.57–1.00)
GFR calc Af Amer: 100 mL/min/{1.73_m2} (ref >59)
GFR calc non Af Amer: 87 mL/min/{1.73_m2} (ref >59)
Globulin, Total: 3 g/dL (ref 1.5–4.5)
Glucose: 110 mg/dL — ABNORMAL HIGH (ref 65–99)
Potassium: 4.5 mmol/L (ref 3.5–5.2)
Sodium: 141 mmol/L (ref 134–144)
Total Protein: 7.3 g/dL (ref 6.0–8.5)

## 2019-01-17 LAB — CBC WITH DIFFERENTIAL/PLATELET
Basophils Absolute: 0 10*3/uL (ref 0.0–0.2)
Basos: 1 %
EOS (ABSOLUTE): 0.2 10*3/uL (ref 0.0–0.4)
Eos: 3 %
Hematocrit: 38.9 % (ref 34.0–46.6)
Hemoglobin: 12.1 g/dL (ref 11.1–15.9)
Immature Grans (Abs): 0 10*3/uL (ref 0.0–0.1)
Immature Granulocytes: 0 %
Lymphocytes Absolute: 2.3 10*3/uL (ref 0.7–3.1)
Lymphs: 39 %
MCH: 23 pg — ABNORMAL LOW (ref 26.6–33.0)
MCHC: 31.1 g/dL — ABNORMAL LOW (ref 31.5–35.7)
MCV: 74 fL — ABNORMAL LOW (ref 79–97)
Monocytes Absolute: 0.6 10*3/uL (ref 0.1–0.9)
Monocytes: 11 %
Neutrophils Absolute: 2.8 10*3/uL (ref 1.4–7.0)
Neutrophils: 46 %
Platelets: 275 10*3/uL (ref 150–450)
RBC: 5.26 x10E6/uL (ref 3.77–5.28)
RDW: 16.8 % — ABNORMAL HIGH (ref 11.7–15.4)
WBC: 5.9 10*3/uL (ref 3.4–10.8)

## 2019-01-17 LAB — LIPID PANEL
Chol/HDL Ratio: 2.3 ratio (ref 0.0–4.4)
Cholesterol, Total: 142 mg/dL (ref 100–199)
HDL: 63 mg/dL (ref >39)
LDL Calculated: 65 mg/dL (ref 0–99)
Triglycerides: 72 mg/dL (ref 0–149)
VLDL Cholesterol Cal: 14 mg/dL (ref 5–40)

## 2019-01-17 LAB — HEMOGLOBIN A1C
Est. average glucose Bld gHb Est-mCnc: 157 mg/dL
Hgb A1c MFr Bld: 7.1 % — ABNORMAL HIGH (ref 4.8–5.6)

## 2019-01-17 LAB — TSH: TSH: 3.74 u[IU]/mL (ref 0.450–4.500)

## 2019-01-30 ENCOUNTER — Encounter: Payer: Self-pay | Admitting: *Deleted

## 2019-01-30 NOTE — Telephone Encounter (Signed)
This encounter was created in error - please disregard.

## 2019-02-01 IMAGING — CT CT ABD-PEL WO/W CM
2 of 11 series · 10 of 46 positions shown, 16 images · IV contrast (APPLIED)
Comparison: None.

CLINICAL DATA: Microscopic hematuria.

EXAM:
CT ABDOMEN AND PELVIS WITHOUT AND WITH CONTRAST
TECHNIQUE: Multidetector CT imaging of the abdomen and pelvis was performed
following the standard protocol before and following the bolus
administration of intravenous contrast.
CONTRAST:  125mL 9CZLAY-PSS IOPAMIDOL (9CZLAY-PSS) INJECTION 61%

[Series 5: coronal pre · coronal · non-contrast · 0.78mm/px · 2 of 76 slices shown, 3 images]
[im 26/76  soft-tissue]
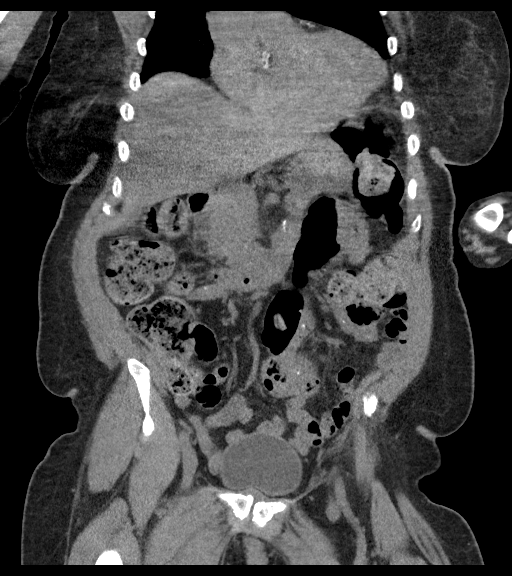
[im 26/76  bone]
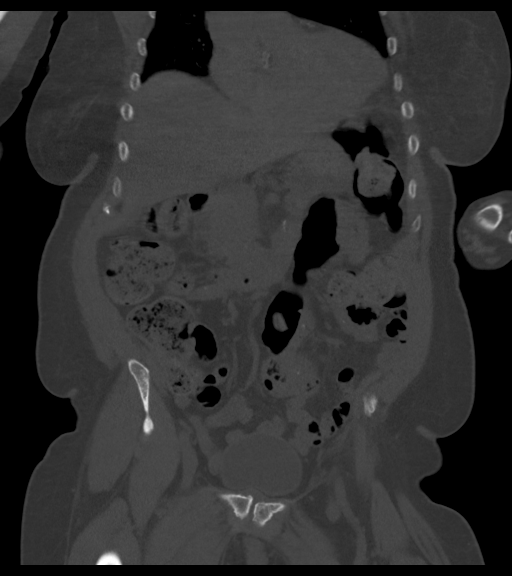
[im 51/76  soft-tissue]
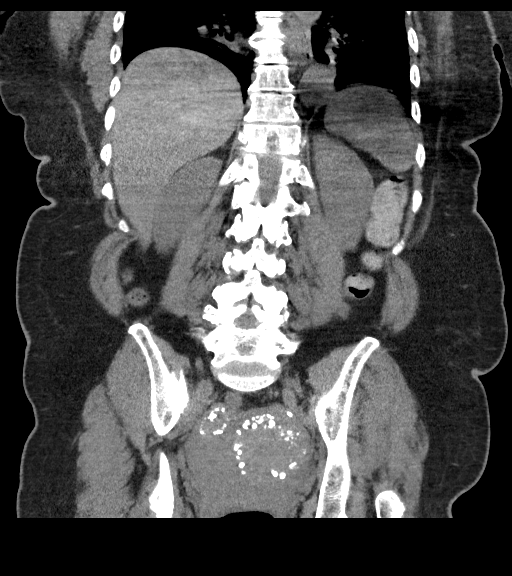

[Series 7: axial post · axial · 0.67mm/px · z∈[-863,-508]mm · 8 of 93 slices shown, 13 images]
[im 11/93  soft-tissue]
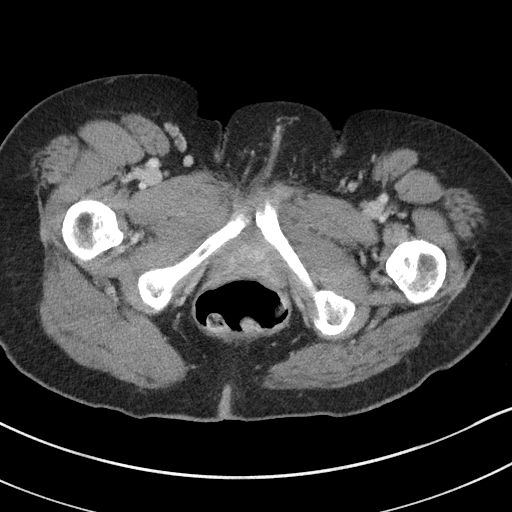
[im 11/93  bone]
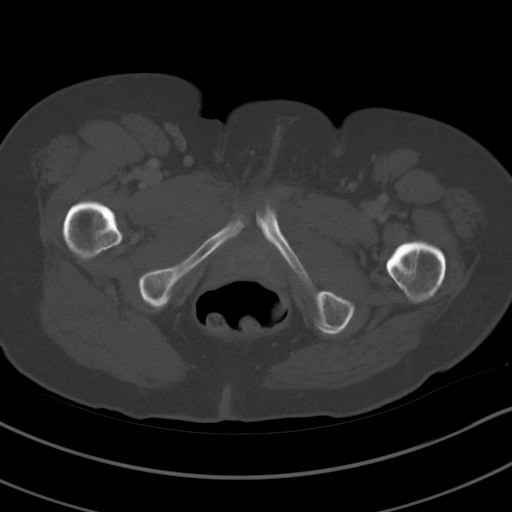
[im 21/93  soft-tissue]
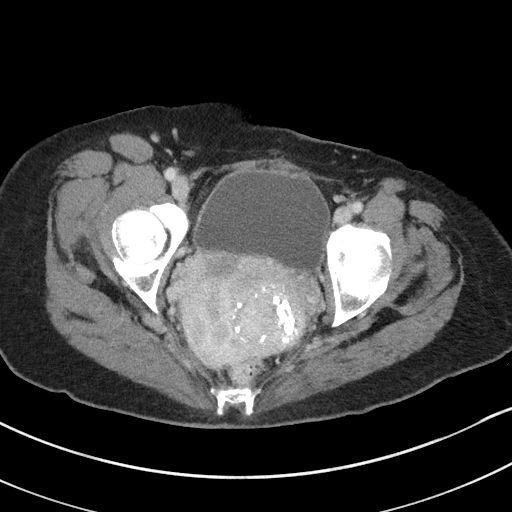
[im 31/93  soft-tissue]
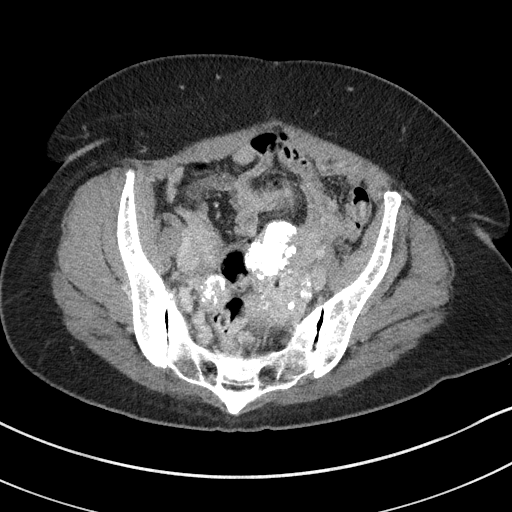
[im 41/93  soft-tissue]
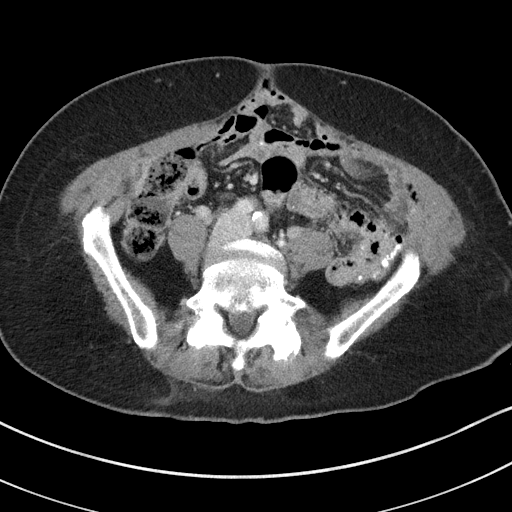
[im 52/93  soft-tissue]
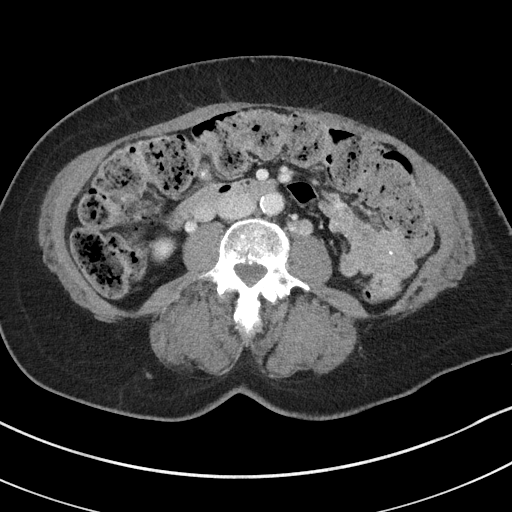
[im 52/93  lung]
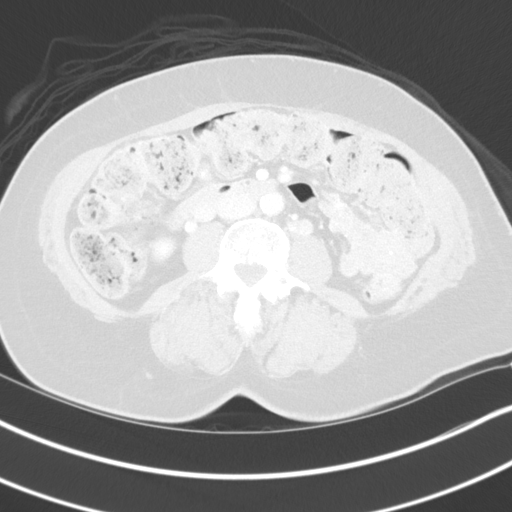
[im 62/93  soft-tissue]
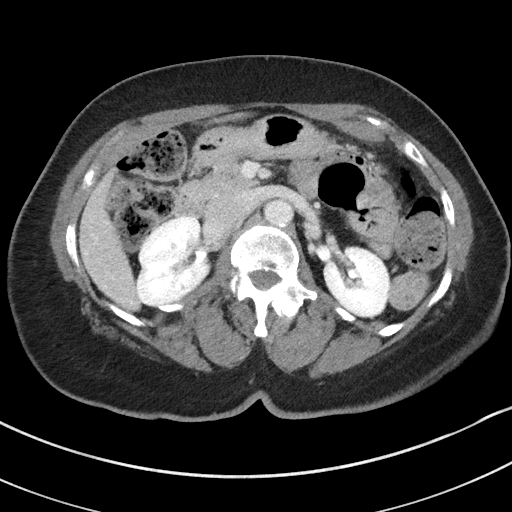
[im 62/93  lung]
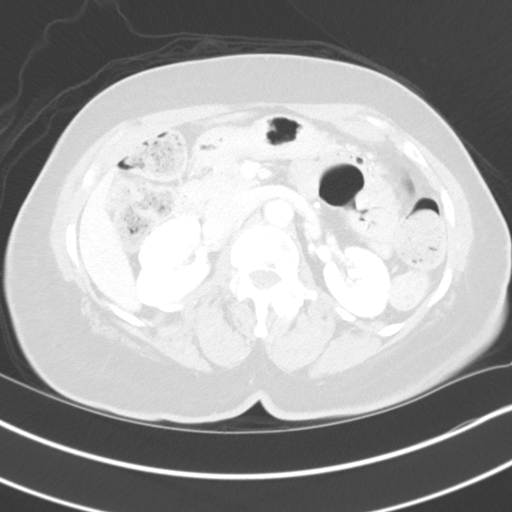
[im 72/93  soft-tissue]
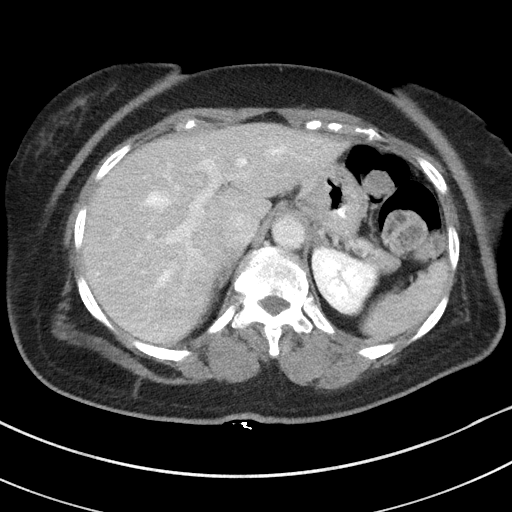
[im 72/93  lung]
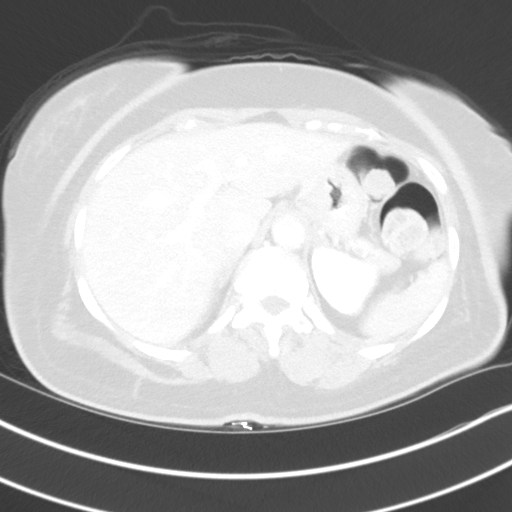
[im 82/93  soft-tissue]
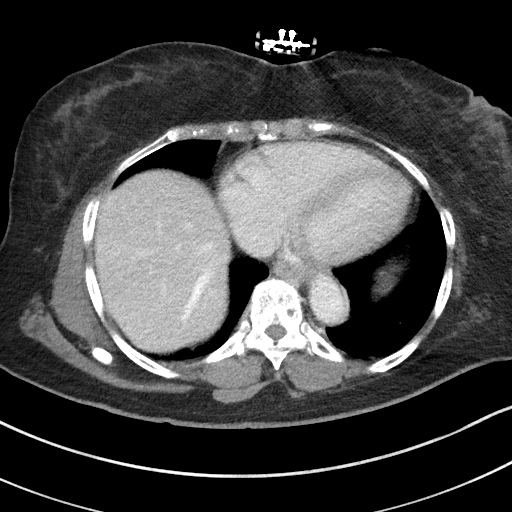
[im 82/93  lung]
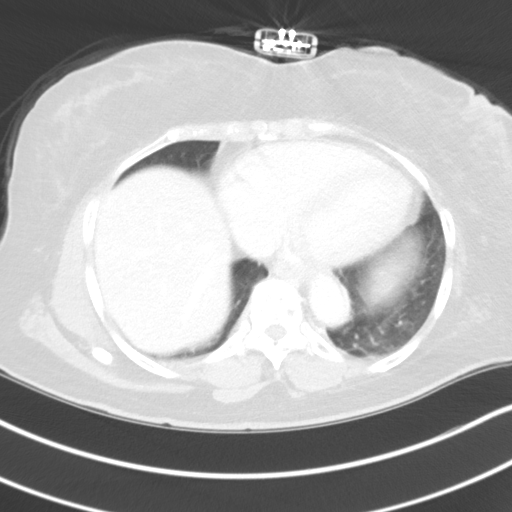

[10 of 46 positions shown; findings below may reference images not displayed]

FINDINGS: Lower chest: The lung bases are grossly clear. No worrisome
pulmonary lesions or pleural effusion. The heart is mildly enlarged
and there are scattered coronary artery calcifications. The
esophagus is grossly normal.

Hepatobiliary: No focal hepatic lesions or intrahepatic biliary
dilatation. The gallbladder is normal. No common bile duct
dilatation.

Pancreas: No mass, inflammation or ductal dilatation.

Spleen: Normal size.  No focal lesions.

Adrenals/Urinary Tract: The adrenal glands and kidneys are
unremarkable. No renal, ureteral or bladder calculi or mass. The
delayed images do not demonstrate any significant collecting system
abnormalities and both ureters appear normal other than being
deviated laterally in the pelvis by the enlarged fibroid uterus. No
bladder mass or asymmetric bladder wall thickening.

Stomach/Bowel: The stomach, duodenum, small bowel and colon are
grossly normal without oral contrast. No inflammatory changes, mass
lesions or obstructive findings. The appendix is normal. Moderate
stool throughout the colon may suggest constipation.

Vascular/Lymphatic: The aorta is normal in caliber. No dissection.
Scattered aortic and iliac artery calcifications. The branch vessels
are patent. The major venous structures are patent. No mesenteric or
retroperitoneal mass or adenopathy. Small scattered lymph nodes are
noted.

Reproductive: Markedly enlarged fibroid uterus with numerous
calcified fibroids. 2 cm submucosal fibroid is noted. The
endometrial thickness is top normal for age. Both ovaries appear
normal.

Other: No free pelvic fluid collections. No inguinal mass or
adenopathy. There is a periumbilical abdominal wall hernia
containing a small bowel loop but no findings for obstruction or
incarceration.

Musculoskeletal: Advanced facet disease involving the lower lumbar
spine with degenerative anterolisthesis of L4. There is also
asymmetric degenerative changes involving the right SI joint with
moderate sclerosis. No obvious erosions.
IMPRESSION: 1. No CT findings to account for the patient's hematuria. No renal,
ureteral or bladder calculi or mass.
2. Markedly enlarged fibroid uterus. 2 cm submucosal fibroid
suspected. Pelvic ultrasound may be helpful for further evaluation.
The endometrium is upper limits of normal in thickness for age.
3. Moderate stool throughout the colon may suggest constipation.
4. Periumbilical abdominal wall hernia containing a small bowel loop
but no evidence of obstruction or perforation.

## 2019-02-03 DIAGNOSIS — H2513 Age-related nuclear cataract, bilateral: Secondary | ICD-10-CM | POA: Diagnosis not present

## 2019-02-03 DIAGNOSIS — E119 Type 2 diabetes mellitus without complications: Secondary | ICD-10-CM | POA: Diagnosis not present

## 2019-02-05 ENCOUNTER — Other Ambulatory Visit: Payer: Self-pay

## 2019-02-05 ENCOUNTER — Ambulatory Visit
Admission: RE | Admit: 2019-02-05 | Discharge: 2019-02-05 | Disposition: A | Discharge status: Home / Self Care | Payer: Medicare Other | Source: Ambulatory Visit | Attending: Internal Medicine | Admitting: Internal Medicine

## 2019-02-05 DIAGNOSIS — Z1231 Encounter for screening mammogram for malignant neoplasm of breast: Secondary | ICD-10-CM | POA: Diagnosis not present

## 2019-02-09 ENCOUNTER — Other Ambulatory Visit: Payer: Self-pay

## 2019-02-09 ENCOUNTER — Other Ambulatory Visit: Payer: Self-pay | Admitting: *Deleted

## 2019-02-09 DIAGNOSIS — I69354 Hemiplegia and hemiparesis following cerebral infarction affecting left non-dominant side: Secondary | ICD-10-CM

## 2019-02-09 NOTE — Patient Outreach (Signed)
[facility]) Care Management  02/09/2019  BRADLEY HANDYSIDE 09/05/1945 [id number]  RN Health Coach received telephone call from patient.  [employer] compliance verified.Per patient she stated that the arm exercises is working that was sent by [employer]. Patient wants a nurse to come in and work with her on her therapy. RN discussed with patient about calling Dr and asking if she can have some physical therapy. Patient stated she has a way to and from therapy.   Plan:  Patient will call Dr office and ask for physical therapy  [facility] [phone]

## 2019-02-17 ENCOUNTER — Encounter: Payer: Self-pay | Admitting: Physical Therapy

## 2019-02-17 ENCOUNTER — Ambulatory Visit: Payer: Medicare Other | Attending: Internal Medicine | Admitting: Physical Therapy

## 2019-02-17 ENCOUNTER — Other Ambulatory Visit: Payer: Self-pay

## 2019-02-17 DIAGNOSIS — M6281 Muscle weakness (generalized): Secondary | ICD-10-CM | POA: Diagnosis not present

## 2019-02-17 DIAGNOSIS — R2689 Other abnormalities of gait and mobility: Secondary | ICD-10-CM

## 2019-02-17 DIAGNOSIS — R262 Difficulty in walking, not elsewhere classified: Secondary | ICD-10-CM | POA: Diagnosis not present

## 2019-02-17 NOTE — Therapy (Signed)
[facility] [address] [place], [place], [address] Phone: [phone]   Fax:  [phone]  Physical Therapy Evaluation  Patient Details  Name: Sharon York MRN: [id number] Date of Birth: 1946-03-08 Referring Provider (PT): Freda Munro, MD   Encounter Date: 02/17/2019  PT End of Session - 02/17/19 0912    Visit Number  1    Number of Visits  5    Date for PT Re-Evaluation  03/17/19    PT Start Time  0856    PT Stop Time  0952    PT Time Calculation (min)  56 min    Equipment Utilized During Treatment  Gait belt    Activity Tolerance  Patient tolerated treatment well    Behavior During Therapy  [facility] for tasks assessed/performed       Past Medical History:  Diagnosis Date  . Absence of bladder continence 12/15/2014  . Blood pressure elevated without history of HTN 12/15/2014  . Diabetes mellitus without complication ([place])   . Hyperlipidemia   . Osteoporosis   . Overactive bladder   . PMB (postmenopausal bleeding) 11/01/2017  . Stroke [facility])    hemiplegia on left side    Past Surgical History:  Procedure Laterality Date  . CESAREAN SECTION  1975  . COLONOSCOPY  2010   normal    There were no vitals filed for this visit.   [facility] PT Assessment - 02/17/19 0001      Assessment   Referring Provider (PT)  Freda Munro, MD    Onset Date/Surgical Date  02/16/94    Hand Dominance  Right    Prior Therapy  Yes      Precautions   Precautions  Fall      Balance Screen   Has the patient fallen in the past 6 months  No    Has the patient had a decrease in activity level because of a fear of falling?   No    Is the patient reluctant to leave their home because of a fear of falling?   No      Home Environment   Living Environment  Private residence    Living Arrangements  Children    Type of [place] Access  Level entry    Home Layout  Two level      Prior Function   Level of Independence  Independent with  basic ADLs;Independent with household mobility with device      Cognition   Overall Cognitive Status  Within Functional Limits for tasks assessed      Standardized Balance Assessment   Standardized Balance Assessment  Berg Balance Test;Dynamic Gait Index      Berg Balance Test   Sit to Stand  Able to stand  independently using hands    Standing Unsupported  Able to stand 2 minutes with supervision    Sitting with Back Unsupported but Feet Supported on Floor or Stool  Able to sit safely and securely 2 minutes    Stand to Sit  Uses backs of legs against chair to control descent    Transfers  Able to transfer safely, definite need of hands    Standing Unsupported with Eyes Closed  Able to stand 3 seconds    Standing Unsupported with Feet Together  Needs help to attain position but able to stand for 30 seconds with feet together    From Standing, Reach Forward with Outstretched Arm  Can  reach confidently >25 cm (10")    From Standing Position, Pick up Object from Floor  Able to pick up shoe, needs supervision    From Standing Position, Turn to Look Behind Over each Shoulder  Needs supervision when turning    Turn 360 Degrees  Able to turn 360 degrees safely but slowly    Standing Unsupported, Alternately Place Feet on Step/Stool  Needs assistance to keep from falling or unable to try    Standing Unsupported, One Foot in Front  Needs help to step but can hold 15 seconds    Standing on One Leg  Unable to try or needs assist to prevent fall    Total Score  29      SUBJECTIVE Chief complaint: Patient states that she presently wants to get stronger and steadier to do the things she wants. She has had PT in the past after her stroke in 1995 and felt it was very helpful, but she notes that if she doesn't continue to work on the exercises she notices more weakness and unsteadiness. She is not fearful of falling, but states that she knows what she can and cannot do. Patient states that if she could get  the deficits out of the picture, she would like to go for a long walk. She likes walking.  Onset: 1995 Imaging: None Recent changes in overall health/medication: No Directional pattern for falls: Backwards Prior history of physical therapy for balance: Yes; several years ago Follow-up appointment with MD: Unsure  Red flags (bowel/bladder changes, saddle paresthesia, personal history of cancer, chills/fever, night sweats, unrelenting pain) as a result of the stroke in 1995  OBJECTIVE MUSCULOSKELETAL: Tremor: Absent Bulk: Normal Tone: Normal, no clonus  Posture Patient has R lateral trunk flexion in sitting with R pelvic obliquity  Gait LLE ER and limited foot clearance and stance time; increased L lateral trunk flexion likely due to cane height in RUE. LUE in flexion posture  Strength R/L Grossly WFL on R; LLE hemiparetic grossly 2/5   NEUROLOGICAL: Mental Status Patient is oriented to person, place and time.  Recent memory is intact.  Remote memory is intact.  Attention span and concentration are intact.  Expressive speech is intact.  Patient's fund of knowledge is within normal limits for educational level.  Cranial Nerves Visual acuity and visual fields are intact  Extraocular muscles are intact  Facial sensation is intact bilaterally  Facial strength is intact bilaterally  Hearing is normal as tested by gross conversation Palate elevates midline, normal phonation  Shoulder shrug strength is intact  Tongue protrudes midline   Sensation Grossly intact to light touch bilateral UEs/LEs as determined by testing dermatomes L2-S2 respectively Proprioception and hot/cold testing deferred on this date  Coordination/Cerebellar Rapid alternating movements: WNL Finger Opposition: WNL  FUNCTIONAL OUTCOME MEASURES   Results Comments  BERG 29/56 Fall risk, in need of intervention  DGI /24 Deferred 2/2 to fatigue and time  TUG seconds Deferred 2/2 to fatigue and time   5TSTS 15.4 seconds Fall risk; SUE support, LLE with minimal WB and reps 4 and 5 incomplete RLE extension  ABC Scale 45.63% Fall risk    ASSESSMENT Patient is a 73 year old presenting to clinic with chief complaints of decreased BLE strength and unsteadiness with walking. Upon examination, patient demonstrates deficits in balance, posture, gait, LLE strength, and LUE ROM and spasticity/tone as evidenced by reliance on [facility] for support in standing and gait, decreased LLE clearance and extensor synergy during gait, R  lateral trunk flexion, and LUE resting in flexion synergy, 5TSTS of 15.4 sec with SUE support, 29/56 on Berg Balance Scale. Patient's responses on ABC Scale outcome measures (45.63%) indicate significant functional limitations/disability/distress. Patient's progress may be limited due to chronicity of deficits, limited belief in physical therapy's long-term effectiveness, and financial constraints; however, patient's interest and sense of independence are advantageous. Patient was able to achieve basic understanding of seated exercises for LLE strengthening during today's evaluation and responded positively to educational interventions. Patient will benefit from continued skilled therapeutic intervention to address deficits in balance, posture, gait, LLE strength, and LUE ROM and spasticity/tone in order to decrease risk of fall, increase function, and improve overall QOL.   Objective measurements completed on examination: See above findings.   TREATMENT  Therapeutic Exercise: Seated hip flexion march, LLE, x10 Seated LAQ, LLE, x10 Seated hip adduction squeezes, x10  Patient educated on prognosis, POC, and provided with HEP including: seated marches, LAQ, and hip adduction isometrics. Patient articulated understanding and returned demonstration. Patient will benefit from further education in order to maximize compliance and understanding for long-term therapeutic gains.        PT  Long Term Goals - 02/17/19 1802      PT LONG TERM GOAL #1   Title  Patient will be independent with HEP in order to improve strength and balance in order to decrease fall risk and improve function at home and in the community.    Baseline  IE: provided    Time  4    Period  Weeks    Status  New    Target Date  03/17/19      PT LONG TERM GOAL #2   Title  Patient will improve BERG by at least 3 points in order to demonstrate clinically significant improvement in balance.    Baseline  IE: 29/56    Time  4    Period  Weeks    Status  New    Target Date  03/17/19      PT LONG TERM GOAL #3   Title  Patient will improve ABC by at least 13% in order to demonstrate clinically significant improvement in balance confidence.    Baseline  IE: 45.63%    Time  4    Period  Weeks    Target Date  03/17/19      PT LONG TERM GOAL #4   Title  Patient will decrease 5TSTS by at least 3 seconds without UE support and with limited compensatory patterns in order to demonstrate clinically significant improvement in LE strength.    Baseline  IE: 15.4 sec, RUE support, pushing back against chair for TKE    Time  4    Period  Weeks    Status  New    Target Date  03/17/19      PT LONG TERM GOAL #5   Title  Patient will improve DGI by at least 3 points in order to demonstrate clinically significant improvement in balance and decreased risk for falls.    Baseline  IE: deferred 2/2 to time and fatigue    Time  4    Period  Weeks    Status  New    Target Date  03/17/19             Plan - 02/17/19 8101    Clinical Impression Statement  Patient is a 73 year old presenting to clinic with chief complaints of decreased BLE strength and unsteadiness with  walking. Upon examination, patient demonstrates deficits in balance, posture, gait, LLE strength, and LUE ROM and spasticity/tone as evidenced by reliance on [facility] for support in standing and gait, decreased LLE clearance and extensor synergy during gait, R  lateral trunk flexion, and LUE resting in flexion synergy, 5TSTS of 15.4 sec with SUE support, 29/56 on Berg Balance Scale. Patient's responses on ABC Scale outcome measures (45.63%) indicate significant functional limitations/disability/distress. Patient's progress may be limited due to chronicity of deficits, limited belief in physical therapy's long-term effectiveness, and financial constraints; however, patient's interest and sense of independence are advantageous. Patient was able to achieve basic understanding of seated exercises for LLE strengthening during today's evaluation and responded positively to educational interventions. Patient will benefit from continued skilled therapeutic intervention to address deficits in balance, posture, gait, LLE strength, and LUE ROM and spasticity/tone in order to decrease risk of fall, increase function, and improve overall QOL.    Personal Factors and Comorbidities  Age;Fitness;Behavior Pattern;Past/Current Experience;Comorbidity 3+;Time since onset of injury/illness/exacerbation;Finances    Comorbidities  DM, osteoporosis, HLD, overactive bladder    Examination-Activity Limitations  Bathing;Transfers;Reach Overhead;Squat;Stairs;Carry;Lift;Stand;Locomotion Level;Bend    Examination-Participation Restrictions  Church;Meal Prep;Cleaning;Community Activity;Laundry;Yard Work;Shop    Stability/Clinical Decision Making  Evolving/Moderate complexity    Clinical Decision Making  Moderate    Rehab Potential  Fair    PT Frequency  1x / week    PT Duration  4 weeks    PT Treatment/Interventions  ADLs/Self Care Home Management;Aquatic Therapy;Cryotherapy;Moist Heat;Gait training;Stair training;Functional mobility training;Therapeutic activities;Therapeutic exercise;Balance training;Neuromuscular re-education;Patient/family education;Manual techniques;Passive range of motion;Energy conservation;Taping;Splinting    PT Next Visit Plan  DGI    PT Home Exercise Plan  Seated  LAQ, hip adduction squeezes, marches    Consulted and Agree with Plan of Care  Patient       Patient will benefit from skilled therapeutic intervention in order to improve the following deficits and impairments:  Abnormal gait, Decreased coordination, Decreased range of motion, Difficulty walking, Impaired tone, Impaired UE functional use, Increased muscle spasms, Decreased endurance, Decreased activity tolerance, Improper body mechanics, Hypomobility, Impaired flexibility, Postural dysfunction, Impaired sensation, Decreased strength, Decreased mobility, Decreased balance  Visit Diagnosis: Muscle weakness (generalized)  Other abnormalities of gait and mobility  Difficulty in walking, not elsewhere classified     Problem List Patient Active Problem List   Diagnosis Date Noted  . Type II diabetes mellitus with complication (HCC) 04/21/2018  . Right shoulder pain 03/31/2018  . Hyperlipidemia associated with type 2 diabetes mellitus ([facility]) 06/12/2017  . Tinea corporis 03/17/2015  . Abnormal gait 12/15/2014  . Altered bowel function 12/15/2014  . Hemiparesis affecting left side as late effect of cerebrovascular accident (CVA) ([facility]) 12/15/2014  . Obstructive sleep apnea of adult 12/15/2014  . Arthritis of shoulder region, degenerative 12/15/2014  . OP (osteoporosis) 12/15/2014  . Allergic rhinitis, seasonal 12/15/2014  . Mixed incontinence 03/11/2013   Sheria Lang PT, DPT [phone] 02/17/2019, 6:04 PM  [facility] [address] [place], [place], [address] Phone: [phone]   Fax:  [phone]  Name: Sharon York MRN: [id number] Date of Birth: February 25, 1946

## 2019-02-21 ENCOUNTER — Other Ambulatory Visit: Payer: Self-pay | Admitting: Internal Medicine

## 2019-02-24 ENCOUNTER — Encounter: Payer: Self-pay | Admitting: Physical Therapy

## 2019-02-24 ENCOUNTER — Ambulatory Visit: Payer: Medicare Other | Attending: Internal Medicine | Admitting: Physical Therapy

## 2019-02-24 ENCOUNTER — Other Ambulatory Visit: Payer: Self-pay

## 2019-02-24 DIAGNOSIS — R262 Difficulty in walking, not elsewhere classified: Secondary | ICD-10-CM | POA: Diagnosis not present

## 2019-02-24 DIAGNOSIS — M6281 Muscle weakness (generalized): Secondary | ICD-10-CM | POA: Diagnosis not present

## 2019-02-24 DIAGNOSIS — R2689 Other abnormalities of gait and mobility: Secondary | ICD-10-CM | POA: Insufficient documentation

## 2019-02-24 NOTE — Therapy (Signed)
**Note Sharon-Identified via Obfuscation** [facility] [address] [place], [place], [address] Phone: [phone]   Fax:  [phone]  Physical Therapy Treatment  Patient Details  Name: Sharon York MRN: [id number] Date of Birth: 05-Sep-1945 Referring Provider (PT): Namon CirriBerglund, L, MD   Encounter Date: 02/24/2019  PT End of Session - 02/24/19 0808    Visit Number  2    Number of Visits  5    Date for PT Re-Evaluation  03/17/19    PT Start Time  0800    PT Stop Time  0845    PT Time Calculation (min)  45 min    Equipment Utilized During Treatment  Gait belt    Activity Tolerance  Patient tolerated treatment well    Behavior During Therapy  [facility] for tasks assessed/performed       Past Medical History:  Diagnosis Date  . Absence of bladder continence 12/15/2014  . Blood pressure elevated without history of HTN 12/15/2014  . Diabetes mellitus without complication ([facility])   . Hyperlipidemia   . Osteoporosis   . Overactive bladder   . PMB (postmenopausal bleeding) 11/01/2017  . Stroke [facility])    hemiplegia on left side    Past Surgical History:  Procedure Laterality Date  . CESAREAN SECTION  1975  . COLONOSCOPY  2010   normal    There were no vitals filed for this visit.  Subjective Assessment - 02/24/19 0807    Subjective  Patient denies any falls since her last appointment. She reports that she has been the "same old same old." Patient did HEP and says she did the hip adduction squeezes more consistently.    Currently in Pain?  No/denies       TREATMENT  Neuromuscular Re-education: Adjusted cane for more balanced posture in standing. Gait in hallway x80 feet with improved LLE foot clearance with cane at lower height.   [facility] PT Assessment - 02/24/19 0001      Standardized Balance Assessment   Standardized Balance Assessment  Timed Up and Go Test;Dynamic Gait Index      Dynamic Gait Index   Level Surface  Moderate Impairment    Change in Gait Speed  Moderate  Impairment    Gait with Horizontal Head Turns  Mild Impairment    Gait with Vertical Head Turns  Mild Impairment    Gait and Pivot Turn  Mild Impairment    Step Over Obstacle  Moderate Impairment    Step Around Obstacles  Mild Impairment    Steps  Severe Impairment    Total Score  11    DGI comment:  Falls risk      Timed Up and Go Test   Normal TUG (seconds)  36   w/ SPC    Increased LLE genu recurvatum noted in all gait activities with ~60-75% consistency. Patient has worn brace previously but no longer has brace that will fit. Is open to trying a new brace.  Therapeutic Exercise: Seated LLE dorsiflexion, PT assist initially, x2.5 min to beat of music Seated LLE hip flexion marches, PT assist initially, x2.5 min to beat of music  Seated LLE hamstring curl, PT assist initially, x2 min to beat of music Reviewed LUE HEP from previous PT.  Patient educated throughout session on appropriate technique and form using multi-modal cueing, HEP, and activity modification. Patient articulated understanding and returned demonstration.  Patient Response to interventions: Reporting mild fatigue; feels confident with exercises.  ASSESSMENT Patient presents to clinic with  excellent motivation to participate in therapy. Patient demonstrates deficits in balance, posture, gait, LLE strength, and LUE ROM and spasticity/tone. Patient able to achieve improved LLE dorsiflexion in sitting during today's session and responded positively to active interventions. Patient will benefit from continued skilled therapeutic intervention to address remaining deficits in balance, posture, gait, LLE strength, and LUE ROM and spasticity/tone  in order to decrease risk of falls, increase function, and improve overall QOL.     PT Long Term Goals - 02/17/19 1802      PT LONG TERM GOAL #1   Title  Patient will be independent with HEP in order to improve strength and balance in order to decrease fall risk and improve  function at home and in the community.    Baseline  IE: provided    Time  4    Period  Weeks    Status  New    Target Date  03/17/19      PT LONG TERM GOAL #2   Title  Patient will improve BERG by at least 3 points in order to demonstrate clinically significant improvement in balance.    Baseline  IE: 29/56    Time  4    Period  Weeks    Status  New    Target Date  03/17/19      PT LONG TERM GOAL #3   Title  Patient will improve ABC by at least 13% in order to demonstrate clinically significant improvement in balance confidence.    Baseline  IE: 45.63%    Time  4    Period  Weeks    Target Date  03/17/19      PT LONG TERM GOAL #4   Title  Patient will decrease 5TSTS by at least 3 seconds without UE support and with limited compensatory patterns in order to demonstrate clinically significant improvement in LE strength.    Baseline  IE: 15.4 sec, RUE support, pushing back against chair for TKE    Time  4    Period  Weeks    Status  New    Target Date  03/17/19      PT LONG TERM GOAL #5   Title  Patient will improve DGI by at least 3 points in order to demonstrate clinically significant improvement in balance and decreased risk for falls.    Baseline  IE: deferred 2/2 to time and fatigue    Time  4    Period  Weeks    Status  New    Target Date  03/17/19            Plan - 02/24/19 0809    Clinical Impression Statement  Patient presents to clinic with excellent motivation to participate in therapy. Patient demonstrates deficits in balance, posture, gait, LLE strength, and LUE ROM and spasticity/tone. Patient able to achieve improved LLE dorsiflexion in sitting during today's session and responded positively to active interventions. Patient will benefit from continued skilled therapeutic intervention to address remaining deficits in balance, posture, gait, LLE strength, and LUE ROM and spasticity/tone  in order to decrease risk of falls, increase function, and improve overall  QOL.    Personal Factors and Comorbidities  Age;Fitness;Behavior Pattern;Past/Current Experience;Comorbidity 3+;Time since onset of injury/illness/exacerbation;Finances    Comorbidities  DM, osteoporosis, HLD, overactive bladder    Examination-Activity Limitations  Bathing;Transfers;Reach Overhead;Squat;Stairs;Carry;Lift;Stand;Locomotion Level;Bend    Examination-Participation Restrictions  Church;Meal Prep;Cleaning;Community Activity;Laundry;Yard Work;Shop    Stability/Clinical Decision Making  Evolving/Moderate complexity    Rehab  Potential  Fair    PT Frequency  1x / week    PT Duration  4 weeks    PT Treatment/Interventions  ADLs/Self Care Home Management;Aquatic Therapy;Cryotherapy;Moist Heat;Gait training;Stair training;Functional mobility training;Therapeutic activities;Therapeutic exercise;Balance training;Neuromuscular re-education;Patient/family education;Manual techniques;Passive range of motion;Energy conservation;Taping;Splinting    PT Next Visit Plan  DGI    PT Home Exercise Plan  Seated LAQ, hip adduction squeezes, marches    Consulted and Agree with Plan of Care  Patient       Patient will benefit from skilled therapeutic intervention in order to improve the following deficits and impairments:  Abnormal gait, Decreased coordination, Decreased range of motion, Difficulty walking, Impaired tone, Impaired UE functional use, Increased muscle spasms, Decreased endurance, Decreased activity tolerance, Improper body mechanics, Hypomobility, Impaired flexibility, Postural dysfunction, Impaired sensation, Decreased strength, Decreased mobility, Decreased balance  Visit Diagnosis: Muscle weakness (generalized)  Other abnormalities of gait and mobility  Difficulty in walking, not elsewhere classified     Problem List Patient Active Problem List   Diagnosis Date Noted  . Type II diabetes mellitus with complication (H[facility]) 04/21/2018  . Right shoulder pain 03/31/2018  .  Hyperlipidemia associated with type 2 diabetes mellitus ([facility]) 06/12/2017  . Tinea corporis 03/17/2015  . Abnormal gait 12/15/2014  . Altered bowel function 12/15/2014  . Hemiparesis affecting left side as late effect of cerebrovascular accident (CVA) ([facility]CC) 12/15/2014  . Obstructive sleep apnea of adult 12/15/2014  . Arthritis of shoulder region, degenerative 12/15/2014  . OP (osteoporosis) 12/15/2014  . Allergic rhinitis, seasonal 12/15/2014  . Mixed incontinence 03/11/2013   Sheria Lang PT, DPT [phone] 02/24/2019, 8:57 AM  [facility] [address] [place], [place], [address] Phone: [phone]   Fax:  [phone]  Name: Sharon York MRN: [id number] Date of Birth: 06/30/1945

## 2019-02-26 ENCOUNTER — Telehealth: Payer: Self-pay

## 2019-02-26 NOTE — Telephone Encounter (Signed)
Patient calling asking if 176/68 is dangerous and states she takes Statin for BP. Advised statin is cholesterol meds. She is taking her BP on her bad arm and will repeat over weekend on other arm when daughter can assist. She also mentioned LB said at last appt that she can use her leg as BP reading. I advised to get a reading on the good arm and if leg gives good reading that is okay but we want true number from good arm. Denies having SOB,swelling, dizziness, or any chest pains or other symptoms. Feels same as always. She did report she takes 500 steps before she checks BP so I reminded her to sit down for 15 min and take BP while sitting. She will call Monday or be seen over weekend ED/UC if develops any symptoms at all.

## 2019-03-03 ENCOUNTER — Encounter: Payer: Self-pay | Admitting: Physical Therapy

## 2019-03-03 ENCOUNTER — Ambulatory Visit: Payer: Medicare Other | Admitting: Physical Therapy

## 2019-03-03 ENCOUNTER — Other Ambulatory Visit: Payer: Self-pay

## 2019-03-03 DIAGNOSIS — R262 Difficulty in walking, not elsewhere classified: Secondary | ICD-10-CM

## 2019-03-03 DIAGNOSIS — M6281 Muscle weakness (generalized): Secondary | ICD-10-CM

## 2019-03-03 DIAGNOSIS — R2689 Other abnormalities of gait and mobility: Secondary | ICD-10-CM

## 2019-03-03 NOTE — Therapy (Signed)
**Note Sharon-Identified via Obfuscation** [place] [facility] [address] [place], [place], [address] Phone: [phone]   Fax:  [phone]  Physical Therapy Treatment  Patient Details  Name: Sharon York MRN: [id number] Date of Birth: [birth year] Referring Provider (PT): Namon CirriBerglund, L, MD   Encounter Date: 03/03/2019  PT End of Session - 03/03/19 0818    Visit Number  3    Number of Visits  5    Date for PT Re-Evaluation  03/17/19    PT Start Time  0815    PT Stop Time  0855    PT Time Calculation (min)  40 min    Equipment Utilized During Treatment  Gait belt    Activity Tolerance  Patient tolerated treatment well    Behavior During Therapy  [facility] for tasks assessed/performed       Past Medical History:  Diagnosis Date  . Absence of bladder continence 12/15/2014  . Blood pressure elevated without history of HTN 12/15/2014  . Diabetes mellitus without complication (HCC)   . Hyperlipidemia   . Osteoporosis   . Overactive bladder   . PMB (postmenopausal bleeding) 11/01/2017  . Stroke [facility](HCC)    hemiplegia on left side    Past Surgical History:  Procedure Laterality Date  . CESAREAN SECTION  1975  . COLONOSCOPY  2010   normal    There were no vitals filed for this visit.  Subjective Assessment - 03/03/19 0821    Subjective  Patient notes that she had a nice weekend and fixed some stew. Patient denies any falls. Patient states that she has been doing her HEP some more than others.    Currently in Pain?  No/denies       TREATMENT  Neuromuscular Re-education: Seated B hip abduction against resistance for improved neural drive to gluteals [id number] Seated L hip abduction against resistance for improved neural drive to LLE 4W102x15 Standing RLE 8" step taps for improved WB and knee control on LLE, 2x15, 1.5# CW. PT assist to block L knee genu recurvatum LLE ankle PNF patterns with PT assist, faded, multiple bouts for improved load acceptance in standing Seated woodchop, L, to  improve WB on L hip and improve postural awareness/control x10 Seated hip flexion march L, x10 for improved foot clearance during gait Gait in hallway and outside on incline surface with VCs for posture and foot clearance  Patient educated throughout session on appropriate technique and form using multi-modal cueing, HEP, and activity modification. Patient articulated understanding and returned demonstration.  Patient Response to interventions: Patient denies any pain or fatigue.  ASSESSMENT Patient presents to clinic with excellent motivation to participate in therapy. Patient demonstrates deficits in balance, posture, gait, LLE strength, and LUE ROM and spasticity/tone. Patient able to achieve improved LLE load acceptance absent of genu recurvatum with Min A from PT during today's session and responded positively to active interventions. Patient will benefit from continued skilled therapeutic intervention to address remaining deficits in balance, posture, gait, LLE strength, and LUE ROM and spasticity/tone  in order to decrease risk of falls, increase function, and improve overall QOL.     PT Long Term Goals - 02/17/19 1802      PT LONG TERM GOAL #1   Title  Patient will be independent with HEP in order to improve strength and balance in order to decrease fall risk and improve function at home and in the community.    Baseline  IE: provided    Time  4  Period  Weeks    Status  New    Target Date  03/17/19      PT LONG TERM GOAL #2   Title  Patient will improve BERG by at least 3 points in order to demonstrate clinically significant improvement in balance.    Baseline  IE: 29/56    Time  4    Period  Weeks    Status  New    Target Date  03/17/19      PT LONG TERM GOAL #3   Title  Patient will improve ABC by at least 13% in order to demonstrate clinically significant improvement in balance confidence.    Baseline  IE: 45.63%    Time  4    Period  Weeks    Target Date  03/17/19       PT LONG TERM GOAL #4   Title  Patient will decrease 5TSTS by at least 3 seconds without UE support and with limited compensatory patterns in order to demonstrate clinically significant improvement in LE strength.    Baseline  IE: 15.4 sec, RUE support, pushing back against chair for TKE    Time  4    Period  Weeks    Status  New    Target Date  03/17/19      PT LONG TERM GOAL #5   Title  Patient will improve DGI by at least 3 points in order to demonstrate clinically significant improvement in balance and decreased risk for falls.    Baseline  IE: deferred 2/2 to time and fatigue    Time  4    Period  Weeks    Status  New    Target Date  03/17/19            Plan - 03/03/19 3810    Clinical Impression Statement  Patient presents to clinic with excellent motivation to participate in therapy. Patient demonstrates deficits in balance, posture, gait, LLE strength, and LUE ROM and spasticity/tone. Patient able to achieve improved LLE load acceptance absent of genu recurvatum with Min A from PT during today's session and responded positively to active interventions. Patient will benefit from continued skilled therapeutic intervention to address remaining deficits in balance, posture, gait, LLE strength, and LUE ROM and spasticity/tone  in order to decrease risk of falls, increase function, and improve overall QOL.    Personal Factors and Comorbidities  Age;Fitness;Behavior Pattern;Past/Current Experience;Comorbidity 3+;Time since onset of injury/illness/exacerbation;Finances    Comorbidities  DM, osteoporosis, HLD, overactive bladder    Examination-Activity Limitations  Bathing;Transfers;Reach Overhead;Squat;Stairs;Carry;Lift;Stand;Locomotion Level;Bend    Examination-Participation Restrictions  Church;Meal Prep;Cleaning;Community Activity;Laundry;Yard Work;Shop    Stability/Clinical Decision Making  Evolving/Moderate complexity    Rehab Potential  Fair    PT Frequency  1x / week     PT Duration  4 weeks    PT Treatment/Interventions  ADLs/Self Care Home Management;Aquatic Therapy;Cryotherapy;Moist Heat;Gait training;Stair training;Functional mobility training;Therapeutic activities;Therapeutic exercise;Balance training;Neuromuscular re-education;Patient/family education;Manual techniques;Passive range of motion;Energy conservation;Taping;Splinting    PT Next Visit Plan  DGI    PT Home Exercise Plan  Seated LAQ, hip adduction squeezes, marches    Consulted and Agree with Plan of Care  Patient       Patient will benefit from skilled therapeutic intervention in order to improve the following deficits and impairments:  Abnormal gait, Decreased coordination, Decreased range of motion, Difficulty walking, Impaired tone, Impaired UE functional use, Increased muscle spasms, Decreased endurance, Decreased activity tolerance, Improper body mechanics, Hypomobility, Impaired flexibility, Postural dysfunction,  Impaired sensation, Decreased strength, Decreased mobility, Decreased balance  Visit Diagnosis: Muscle weakness (generalized)  Other abnormalities of gait and mobility  Difficulty in walking, not elsewhere classified     Problem List Patient Active Problem List   Diagnosis Date Noted  . Type II diabetes mellitus with complication (HCC) 04/21/2018  . Right shoulder pain 03/31/2018  . Hyperlipidemia associated with type 2 diabetes mellitus (HCC) 06/12/2017  . Tinea corporis 03/17/2015  . Abnormal gait 12/15/2014  . Altered bowel function 12/15/2014  . Hemiparesis affecting left side as late effect of cerebrovascular accident (CVA) (HCC) 12/15/2014  . Obstructive sleep apnea of adult 12/15/2014  . Arthritis of shoulder region, degenerative 12/15/2014  . OP (osteoporosis) 12/15/2014  . Allergic rhinitis, seasonal 12/15/2014  . Mixed incontinence 03/11/2013   Sheria Lang PT, DPT [phone] 03/03/2019, 12:33 PM  [place] [facility] [address] [place], [place], [address] Phone: [phone]   Fax:  [phone]  Name: Sharon York MRN: [id number] Date of Birth: 06/14/1945

## 2019-03-04 ENCOUNTER — Telehealth: Payer: Self-pay

## 2019-03-04 NOTE — Telephone Encounter (Signed)
Calling to report BS 110-160 avg/ I asked if she has checked her BP on other arm yet regarding last note. She said she has no BP machine and entire call last week was related to Blood Sugar. I explained BS is not critical and advised to call if it goes out of the avg

## 2019-03-10 ENCOUNTER — Encounter: Payer: Self-pay | Admitting: Physical Therapy

## 2019-03-10 ENCOUNTER — Other Ambulatory Visit: Payer: Self-pay

## 2019-03-10 ENCOUNTER — Ambulatory Visit: Payer: Medicare Other | Admitting: Physical Therapy

## 2019-03-10 DIAGNOSIS — R262 Difficulty in walking, not elsewhere classified: Secondary | ICD-10-CM

## 2019-03-10 DIAGNOSIS — M6281 Muscle weakness (generalized): Secondary | ICD-10-CM | POA: Diagnosis not present

## 2019-03-10 DIAGNOSIS — R2689 Other abnormalities of gait and mobility: Secondary | ICD-10-CM | POA: Diagnosis not present

## 2019-03-10 NOTE — Therapy (Signed)
**Note Sharon-Identified via Obfuscation** [place] [facility] [place] [facility] [address] [place], [place], [address] Phone: [phone]   Fax:  [phone]  Physical Therapy Treatment  Patient Details  Name: Sharon York MRN: [id number] Date of Birth: 08-30-1945 Referring Provider (PT): Namon Cirri, MD   Encounter Date: 03/10/2019  PT End of Session - 03/10/19 0805    Visit Number  4    Number of Visits  5    Date for PT Re-Evaluation  03/17/19    PT Start Time  0800    PT Stop Time  0845    PT Time Calculation (min)  45 min    Equipment Utilized During Treatment  Gait belt    Activity Tolerance  Patient tolerated treatment well    Behavior During Therapy  [facility] for tasks assessed/performed       Past Medical History:  Diagnosis Date  . Absence of bladder continence 12/15/2014  . Blood pressure elevated without history of HTN 12/15/2014  . Diabetes mellitus without complication (HCC)   . Hyperlipidemia   . Osteoporosis   . Overactive bladder   . PMB (postmenopausal bleeding) 11/01/2017  . Stroke [facility])    hemiplegia on left side    Past Surgical History:  Procedure Laterality Date  . CESAREAN SECTION  1975  . COLONOSCOPY  2010   normal    There were no vitals filed for this visit.  Subjective Assessment - 03/10/19 0803    Subjective  Patient states that she had an uneventful weekend. She reports that she has started taking blood sugar in the R hand which has been reading slightly different than the L. Patient's blood sugar this AM was 154/156.    Currently in Pain?  No/denies       TREATMENT  Neuromuscular Re-education: Patient education on benefits of stabilizing knee with tape/brace for improved motor control of LLE during load acceptance phase of gait. Patient amenable to try taping today.  PT assessed skin and clean skinned with alcohol prep pad. Patient noted to have typical age related  thinning of skin and educated on safe removal of tape. Patient articulated  understanding. PT applied  kinesiotape to posterior knee in roughly 20 degrees of flexion with 75-100% pull to block hyperextension. PT  also applied kinesiotape to anterior knee to encourage activation of VMO and RF for improved knee stability  during gait. Seated L dorsiflexion x40 with intermittent assist (strap/PT) to allow for decreased compensatory movements Seated L hamstring curls x45 with intermittent PT assist to improve ROM faded to PT resistance for strengthening Seated hip flexion march L, x10 for improved foot clearance during gait Multiple bouts of gait in hallway with intermittent TCs to prevent genu recurvatum; application of tape providing some blocking of genu recurvatum (~ 20% persisting or every fifth step). Gait outside on incline surface with no need of VCs for posture and foot clearance  Patient educated throughout session on appropriate technique and form using multi-modal cueing, HEP, and activity modification. Patient articulated understanding and returned demonstration.  Patient Response to interventions: Patient denies any pain or fatigue.  ASSESSMENT Patient presents to clinic with excellent motivation to participate in therapy. Patient demonstrates deficits in balance, posture, gait, LLE strength, and LUE ROM and spasticity/tone. Patient able to achieve improved LLE load acceptance absent of genu recurvatum with taping and intermittent TCs from PT during today's session and responded positively to active interventions. Patient will benefit from continued skilled therapeutic intervention to address remaining deficits in balance,  posture, gait, LLE strength, and LUE ROM and spasticity/tone  in order to decrease risk of falls, increase function, and improve overall QOL.    PT Long Term Goals - 02/17/19 1802      PT LONG TERM GOAL #1   Title  Patient will be independent with HEP in order to improve strength and balance in order to decrease fall risk and improve function  at home and in the community.    Baseline  IE: provided    Time  4    Period  Weeks    Status  New    Target Date  03/17/19      PT LONG TERM GOAL #2   Title  Patient will improve BERG by at least 3 points in order to demonstrate clinically significant improvement in balance.    Baseline  IE: 29/56    Time  4    Period  Weeks    Status  New    Target Date  03/17/19      PT LONG TERM GOAL #3   Title  Patient will improve ABC by at least 13% in order to demonstrate clinically significant improvement in balance confidence.    Baseline  IE: 45.63%    Time  4    Period  Weeks    Target Date  03/17/19      PT LONG TERM GOAL #4   Title  Patient will decrease 5TSTS by at least 3 seconds without UE support and with limited compensatory patterns in order to demonstrate clinically significant improvement in LE strength.    Baseline  IE: 15.4 sec, RUE support, pushing back against chair for TKE    Time  4    Period  Weeks    Status  New    Target Date  03/17/19      PT LONG TERM GOAL #5   Title  Patient will improve DGI by at least 3 points in order to demonstrate clinically significant improvement in balance and decreased risk for falls.    Baseline  IE: deferred 2/2 to time and fatigue    Time  4    Period  Weeks    Status  New    Target Date  03/17/19            Plan - 03/10/19 1156    Clinical Impression Statement  Patient presents to clinic with excellent motivation to participate in therapy. Patient demonstrates deficits in balance, posture, gait, LLE strength, and LUE ROM and spasticity/tone. Patient able to achieve improved LLE load acceptance absent of genu recurvatum with taping and intermittent TCs from PT during today's session and responded positively to active interventions. Patient will benefit from continued skilled therapeutic intervention to address remaining deficits in balance, posture, gait, LLE strength, and LUE ROM and spasticity/tone  in order to decrease  risk of falls, increase function, and improve overall QOL.    Personal Factors and Comorbidities  Age;Fitness;Behavior Pattern;Past/Current Experience;Comorbidity 3+;Time since onset of injury/illness/exacerbation;Finances    Comorbidities  DM, osteoporosis, HLD, overactive bladder    Examination-Activity Limitations  Bathing;Transfers;Reach Overhead;Squat;Stairs;Carry;Lift;Stand;Locomotion Level;Bend    Examination-Participation Restrictions  Church;Meal Prep;Cleaning;Community Activity;Laundry;Yard Work;Shop    Stability/Clinical Decision Making  Evolving/Moderate complexity    Rehab Potential  Fair    PT Frequency  1x / week    PT Duration  4 weeks    PT Treatment/Interventions  ADLs/Self Care Home Management;Aquatic Therapy;Cryotherapy;Moist Heat;Gait training;Stair training;Functional mobility training;Therapeutic activities;Therapeutic exercise;Balance training;Neuromuscular re-education;Patient/family education;Manual techniques;Passive range  of motion;Energy conservation;Taping;Splinting    PT Next Visit Plan  DGI    PT Home Exercise Plan  Seated LAQ, hip adduction squeezes, marches    Consulted and Agree with Plan of Care  Patient       Patient will benefit from skilled therapeutic intervention in order to improve the following deficits and impairments:  Abnormal gait, Decreased coordination, Decreased range of motion, Difficulty walking, Impaired tone, Impaired UE functional use, Increased muscle spasms, Decreased endurance, Decreased activity tolerance, Improper body mechanics, Hypomobility, Impaired flexibility, Postural dysfunction, Impaired sensation, Decreased strength, Decreased mobility, Decreased balance  Visit Diagnosis: Muscle weakness (generalized)  Other abnormalities of gait and mobility  Difficulty in walking, not elsewhere classified     Problem List Patient Active Problem List   Diagnosis Date Noted  . Type II diabetes mellitus with complication (HCC)  04/21/2018  . Right shoulder pain 03/31/2018  . Hyperlipidemia associated with type 2 diabetes mellitus (HCC) 06/12/2017  . Tinea corporis 03/17/2015  . Abnormal gait 12/15/2014  . Altered bowel function 12/15/2014  . Hemiparesis affecting left side as late effect of cerebrovascular accident (CVA) (HCC) 12/15/2014  . Obstructive sleep apnea of adult 12/15/2014  . Arthritis of shoulder region, degenerative 12/15/2014  . OP (osteoporosis) 12/15/2014  . Allergic rhinitis, seasonal 12/15/2014  . Mixed incontinence 03/11/2013   Sheria LangKatlin Elanie Hammitt PT, DPT [phone] 03/10/2019, 12:08 PM  [place] [facility] [address] [place], [place], [address] Phone: [phone]   Fax:  [phone]  Name: Sharon York MRN: [id number] Date of Birth: 09/23/1945

## 2019-03-11 ENCOUNTER — Other Ambulatory Visit: Payer: Self-pay | Admitting: *Deleted

## 2019-03-11 NOTE — Patient Outreach (Signed)
[place] [facility]) Care Management  03/11/2019   Sharon York 01/05/1946 [phone]  RN Health Coach telephone call to patient.  [employer] compliance verified. Per patient she is doing better. Patient is going to rehab. Patient fasting blood sugar is 136. Patient A1C is 7.1. Patient is currently in physical therapy. She stated she is feeling better. She ambulates with a cane or walker. Patient is trying to eat healthier. Patient has agreed to follow up outreach.  Current Medications:  Current Outpatient Medications  Medication Sig Dispense Refill  . ACCU-CHEK AVIVA PLUS test strip  USE 1 TEST STRIP UP TO 4 TIMES DAILY 400 each 3  . ACCU-CHEK FASTCLIX LANCETS MISC 1 each by Does not apply route 2 (two) times daily. 100 each 3  . alendronate (FOSAMAX) 70 MG tablet TAKE 1 TABLET BY MOUTH ONCE A WEEK TAKE  WITH  A  FULL  GLASS  OF  WATER  ON  AN  EMPTY  STOMACH 12 tablet 4  . aspirin 81 MG chewable tablet Chew 1 tablet by mouth daily.    Marland Kitchen atorvastatin (LIPITOR) 10 MG tablet Take 1 tablet (10 mg total) by mouth daily. 90 tablet 3  . baclofen (LIORESAL) 10 MG tablet Take 1 tablet (10 mg total) by mouth 2 (two) times daily. 60 tablet 12  . blood glucose meter kit and supplies KIT Dispense based on patient and insurance preference. Use up to 3 times daily as directed to check Blood Sugar for ICD10: E11.69 Uncontrolled DM2 1 each 0  . Calcium Carbonate-Vitamin D (CALCIUM 500 + D) 500-125 MG-UNIT TABS Take 1 tablet by mouth daily.     . metFORMIN (GLUCOPHAGE-XR) 500 MG 24 hr tablet TAKE 1 TABLET BY MOUTH WITH BREAKFAST 90 tablet 3  . Multiple Vitamins-Minerals (MULTIVITAMIN ADULT PO) Take by mouth.    . NUTRITIONAL SUPP - DIET AIDS PO Take by mouth. Diabetamin- Lowers sugar and cravings.     No current facility-administered medications for this visit.     Functional Status:  In your present state of health, do you have any difficulty performing the following activities: 03/11/2019  01/12/2019  Hearing? N Y  Comment slight hoh has hearing aids  Vision? N N  Comment - wears glasses  Difficulty concentrating or making decisions? N N  Walking or climbing stairs? Y Y  Comment uses cane or walker/ results from cva -  Dressing or bathing? N N  Doing errands, shopping? Y N  Comment daughter does errands -  Conservation officer, nature and eating ? Y N  Comment reeceives meals on wheels -  Using the Toilet? N N  In the past six months, have you accidently leaked urine? Y Y  Do you have problems with loss of bowel control? N N  Managing your Medications? N N  Managing your Finances? Aggie Moats  Comment daughter assists -  Housekeeping or managing your Housekeeping? Y N  Comment daughter assists -  Some recent data might be hidden    Fall/Depression Screening: Fall Risk  03/11/2019 01/16/2019 01/12/2019  Falls in the past year? 0 0 0  Comment - - -  Number falls in past yr: 0 0 0  Injury with Fall? 0 0 0  Risk Factor Category  - - -  Risk for fall due to : Impaired balance/gait;Impaired mobility Impaired balance/gait;Impaired mobility Impaired balance/gait;Impaired mobility  Risk for fall due to: Comment - - -  Follow up Falls evaluation completed;Falls prevention discussed Falls evaluation completed;Falls prevention  discussed;Education provided Falls prevention discussed   PHQ 2/9 Scores 03/11/2019 01/16/2019 01/16/2019 01/12/2019 10/29/2018 09/19/2018 07/10/2018  PHQ - 2 Score 0 0 0 0 0 0 0  PHQ- 9 Score - - 0 2 - - -   THN CM Care Plan Problem One     Most Recent Value  Care Plan Problem One  Knowledge Deficit in Self Management of Diabetes  Role Documenting the Problem One  Philip for Problem One  Active  THN Long Term Goal   Patient will see a decrease in A1C 7.0 within the next 90 days  Interventions for Problem One Long Term Goal  RN reiterated healthy eating and monitoring diet during the Holidays. RN discussed fasting blood sugar and A1C.N will follow up for  further discussion  THN CM Short Term Goal #1 Met Date  03/11/19  [facility] CM Short Term Goal #2 Met Date  03/11/19  THN CM Short Term Goal #3  Patient will identify foods that make up a healthy snack within the next 30 days  THN CM Short Term Goal #3 Met Date  03/11/19      Assessment:  Patient had eye exam in September A1C 7.1 Fasting Blood sugar 136 Patient is currently going to Physical therapy  Plan: RN discussed healthy eating RN discussed adhering to diet during holidays RN discussed pandemic precautions RN discussed continuing exercises after physical therapy RN will follow up within the month of January  Sharon York Management [phone]

## 2019-03-17 ENCOUNTER — Encounter: Payer: Self-pay | Admitting: Physical Therapy

## 2019-03-17 ENCOUNTER — Other Ambulatory Visit: Payer: Self-pay

## 2019-03-17 ENCOUNTER — Ambulatory Visit: Payer: Medicare Other | Admitting: Physical Therapy

## 2019-03-17 DIAGNOSIS — R262 Difficulty in walking, not elsewhere classified: Secondary | ICD-10-CM | POA: Diagnosis not present

## 2019-03-17 DIAGNOSIS — R2689 Other abnormalities of gait and mobility: Secondary | ICD-10-CM | POA: Diagnosis not present

## 2019-03-17 DIAGNOSIS — M6281 Muscle weakness (generalized): Secondary | ICD-10-CM

## 2019-03-17 NOTE — Therapy (Signed)
[facility] [address]. [place], [place], [address] Phone: [phone]   Fax:  [phone]  Physical Therapy Treatment  Patient Details  Name: Sharon York MRN: [id number] Date of Birth: 02/06/1946 Referring Provider (PT): Freda Munro, MD   Encounter Date: 03/17/2019  PT End of Session - 03/17/19 0754    Visit Number  5    Number of Visits  9    Date for PT Re-Evaluation  04/14/19    PT Start Time  0757    PT Stop Time  0850    PT Time Calculation (min)  53 min    Equipment Utilized During Treatment  Gait belt    Activity Tolerance  Patient tolerated treatment well    Behavior During Therapy  [facility] for tasks assessed/performed       Past Medical History:  Diagnosis Date  . Absence of bladder continence 12/15/2014  . Blood pressure elevated without history of HTN 12/15/2014  . Diabetes mellitus without complication ([place])   . Hyperlipidemia   . Osteoporosis   . Overactive bladder   . PMB (postmenopausal bleeding) 11/01/2017  . Stroke [facility])    hemiplegia on left side    Past Surgical History:  Procedure Laterality Date  . CESAREAN SECTION  1975  . COLONOSCOPY  2010   normal    There were no vitals filed for this visit.  Subjective Assessment - 03/17/19 1141    Subjective  Patient reports that she has not had any concerns since her last session. She notes that she feels physical therapy is helping and she likes coming. She removed the kinesiotape after her last session with no irritation to the skin.    Currently in Pain?  No/denies         [facility] PT Assessment - 03/17/19 0001      Berg Balance Test   Sit to Stand  Able to stand  independently using hands    Standing Unsupported  Able to stand 2 minutes with supervision    Sitting with Back Unsupported but Feet Supported on Floor or Stool  Able to sit safely and securely 2 minutes    Stand to Sit  Controls descent by using hands    Transfers  Able to transfer  safely, definite need of hands    Standing Unsupported with Eyes Closed  Able to stand 10 seconds with supervision    Standing Unsupported with Feet Together  Needs help to attain position but able to stand for 30 seconds with feet together    From Standing, Reach Forward with Outstretched Arm  Can reach confidently >25 cm (10")    From Standing Position, Pick up Object from Floor  Able to pick up shoe safely and easily    From Standing Position, Turn to Look Behind Over each Shoulder  Looks behind one side only/other side shows less weight shift    Turn 360 Degrees  Able to turn 360 degrees safely but slowly    Standing Unsupported, Alternately Place Feet on Step/Stool  Able to complete >2 steps/needs minimal assist    Standing Unsupported, One Foot in Front  Needs help to step but can hold 15 seconds    Standing on One Leg  Unable to try or needs assist to prevent fall    Total Score  35      Dynamic Gait Index   Level Surface  Mild Impairment    Change in Gait Speed  Mild Impairment  Gait with Horizontal Head Turns  Mild Impairment    Gait with Vertical Head Turns  Mild Impairment    Gait and Pivot Turn  Mild Impairment    Step Over Obstacle  Moderate Impairment    Step Around Obstacles  Mild Impairment    Steps  Severe Impairment    Total Score  13    DGI comment:  Falls risk       TREATMENT  Neuromuscular Re-education: Reassessed goals (see above and below).  Applied blue theraband dorsiflexion assist with knee hyperextension resistance prior to ambulation in hallway and outside. Patient's gait speed benefiting from dorsiflexion assist as well as more controlled knee thrust. Patient able to ambulate outside on uneven surfaces using SPC and SBA/Mod I for > 200 feet with only 3 instances of knee hyperextension.  Patient educated throughout session on appropriate technique and form using multi-modal cueing, HEP, and activity modification. Patient articulated understanding and  returned demonstration.  Patient Response to interventions: Patient denies any pain or fatigue.  ASSESSMENT Patient presents to clinic with excellent motivation to participate in therapy. Patient continues to demonstrate deficits in balance, posture, gait, LLE strength, and LUE ROM and spasticity/tone. Patient is better able to control LLE during gait cycle with dorsiflexion assist and knee hyperextension resistance and has a better chance to preserve the LLE joints if fitted for an appropriate orthosis. Patient has made inconsistent progress with her goals; her overall balance assessments show improvements, but her balance confidence has diminished as reported on the ABC Scale. Patient's condition has the potential to improve in response to therapy. Maximum improvement is yet to be obtained. The anticipated improvement is attainable and reasonable in a generally predictable time. Patient will benefit from continued skilled therapeutic intervention to address remaining deficits in balance, posture, gait, LLE strength, and LUE ROM and spasticity/tone  in order to decrease risk of falls, increase function, and improve overall QOL.    PT Long Term Goals - 03/17/19 0818      PT LONG TERM GOAL #1   Title  Patient will be independent with HEP in order to improve strength and balance in order to decrease fall risk and improve function at home and in the community.    Baseline  IE: provided; 03/17/2019: IND    Time  4    Period  Weeks    Status  Achieved      PT LONG TERM GOAL #2   Title  Patient will improve BERG by at least 3 points in order to demonstrate clinically significant improvement in balance.    Baseline  IE: 29/56; 10/27: 35/56    Time  4    Period  Weeks    Status  Partially Met    Target Date  04/14/19      PT LONG TERM GOAL #3   Title  Patient will improve ABC by at least 13% in order to demonstrate clinically significant improvement in balance confidence.    Baseline  IE:  45.63%; 03/17/2019: 27.18%    Time  4    Period  Weeks    Status  On-going    Target Date  04/14/19      PT LONG TERM GOAL #4   Title  Patient will decrease 5TSTS by at least 3 seconds with SUE support and with limited compensatory patterns in order to demonstrate clinically significant improvement in LE strength.    Baseline  IE: 15.4 sec, RUE support, pushing back against chair for TKE;  10/27: 16.6 sec, RUE, with theraband dorsiflexion assist/knee hyperextension block    Time  4    Period  Weeks    Status  Revised    Target Date  04/14/19      PT LONG TERM GOAL #5   Title  Patient will improve DGI by at least 3 points in order to demonstrate clinically significant improvement in balance and decreased risk for falls.    Baseline  IE: deferred 2/2 to time and fatigue (10/6: 11/19 falls risk); 10/27: 13/19    Time  4    Period  Weeks    Status  On-going    Target Date  04/14/19            Plan - 03/17/19 0754    Clinical Impression Statement  Patient presents to clinic with excellent motivation to participate in therapy. Patient continues to demonstrate deficits in balance, posture, gait, LLE strength, and LUE ROM and spasticity/tone. Patient is better able to control LLE during gait cycle with dorsiflexion assist and knee hyperextension resistance and has a better chance to preserve the LLE joints if fitted for an appropriate orthosis. Patient has made inconsistent progress with her goals; her overall balance assessments show improvements, but her balance confidence has diminished as reported on the ABC Scale. Patient's condition has the potential to improve in response to therapy. Maximum improvement is yet to be obtained. The anticipated improvement is attainable and reasonable in a generally predictable time. Patient will benefit from continued skilled therapeutic intervention to address remaining deficits in balance, posture, gait, LLE strength, and LUE ROM and spasticity/tone  in  order to decrease risk of falls, increase function, and improve overall QOL.    Personal Factors and Comorbidities  Age;Fitness;Behavior Pattern;Past/Current Experience;Comorbidity 3+;Time since onset of injury/illness/exacerbation;Finances    Comorbidities  DM, osteoporosis, HLD, overactive bladder    Examination-Activity Limitations  Bathing;Transfers;Reach Overhead;Squat;Stairs;Carry;Lift;Stand;Locomotion Level;Bend    Examination-Participation Restrictions  Church;Meal Prep;Cleaning;Community Activity;Laundry;Yard Work;Shop    Stability/Clinical Decision Making  Evolving/Moderate complexity    Rehab Potential  Fair    PT Frequency  1x / week    PT Duration  4 weeks    PT Treatment/Interventions  ADLs/Self Care Home Management;Aquatic Therapy;Cryotherapy;Moist Heat;Gait training;Stair training;Functional mobility training;Therapeutic activities;Therapeutic exercise;Balance training;Neuromuscular re-education;Patient/family education;Manual techniques;Passive range of motion;Energy conservation;Taping;Splinting;Orthotic Fit/Training;DME Instruction    PT Next Visit Plan  Gait with brace/orthosis    PT Home Exercise Plan  Seated LAQ, hip adduction squeezes, marches    Consulted and Agree with Plan of Care  Patient       Patient will benefit from skilled therapeutic intervention in order to improve the following deficits and impairments:  Abnormal gait, Decreased coordination, Decreased range of motion, Difficulty walking, Impaired tone, Impaired UE functional use, Increased muscle spasms, Decreased endurance, Decreased activity tolerance, Improper body mechanics, Hypomobility, Impaired flexibility, Postural dysfunction, Impaired sensation, Decreased strength, Decreased mobility, Decreased balance  Visit Diagnosis: Muscle weakness (generalized)  Other abnormalities of gait and mobility  Difficulty in walking, not elsewhere classified     Problem List Patient Active Problem List    Diagnosis Date Noted  . Type II diabetes mellitus with complication ([place]) 18/84/1660  . Right shoulder pain 03/31/2018  . Hyperlipidemia associated with type 2 diabetes mellitus ([place]) 06/12/2017  . Tinea corporis 03/17/2015  . Abnormal gait 12/15/2014  . Altered bowel function 12/15/2014  . Hemiparesis affecting left side as late effect of cerebrovascular accident (CVA) ([place]) 12/15/2014  . Obstructive sleep apnea of adult 12/15/2014  .  Arthritis of shoulder region, degenerative 12/15/2014  . OP (osteoporosis) 12/15/2014  . Allergic rhinitis, seasonal 12/15/2014  . Mixed incontinence 03/11/2013   Myles Gip PT, DPT [id number]  03/17/2019, 11:54 AM   [facility] [address] [place], [place], [address] Phone: [phone]   Fax:  [phone]  Name: Sharon York MRN: [id number] Date of Birth: 14-Jun-1945

## 2019-03-22 ENCOUNTER — Other Ambulatory Visit: Payer: Self-pay | Admitting: Internal Medicine

## 2019-03-30 DIAGNOSIS — M79674 Pain in right toe(s): Secondary | ICD-10-CM | POA: Diagnosis not present

## 2019-03-30 DIAGNOSIS — B353 Tinea pedis: Secondary | ICD-10-CM | POA: Diagnosis not present

## 2019-03-30 DIAGNOSIS — B351 Tinea unguium: Secondary | ICD-10-CM | POA: Diagnosis not present

## 2019-03-30 DIAGNOSIS — M79675 Pain in left toe(s): Secondary | ICD-10-CM | POA: Diagnosis not present

## 2019-04-01 ENCOUNTER — Other Ambulatory Visit: Payer: Self-pay

## 2019-04-01 ENCOUNTER — Ambulatory Visit: Payer: Medicare Other | Attending: Internal Medicine | Admitting: Physical Therapy

## 2019-04-01 ENCOUNTER — Encounter: Payer: Self-pay | Admitting: Physical Therapy

## 2019-04-01 DIAGNOSIS — R2689 Other abnormalities of gait and mobility: Secondary | ICD-10-CM | POA: Diagnosis not present

## 2019-04-01 DIAGNOSIS — M6281 Muscle weakness (generalized): Secondary | ICD-10-CM | POA: Diagnosis not present

## 2019-04-01 DIAGNOSIS — R262 Difficulty in walking, not elsewhere classified: Secondary | ICD-10-CM

## 2019-04-01 NOTE — Therapy (Signed)
[facility] [address] [place], [place], [address] Phone: [phone]   Fax:  [phone]  Physical Therapy Treatment  Patient Details  Name: Sharon York MRN: [id number] Date of Birth: 19-Dec-1945 Referring Provider (PT): Freda Munro, MD   Encounter Date: 04/01/2019  PT End of Session - 04/01/19 0815    Visit Number  6    Number of Visits  9    Date for PT Re-Evaluation  04/14/19    PT Start Time  0801    PT Stop Time  4580    PT Time Calculation (min)  54 min    Equipment Utilized During Treatment  Gait belt    Activity Tolerance  Patient tolerated treatment well    Behavior During Therapy  [facility] for tasks assessed/performed       Past Medical History:  Diagnosis Date  . Absence of bladder continence 12/15/2014  . Blood pressure elevated without history of HTN 12/15/2014  . Diabetes mellitus without complication ([place])   . Hyperlipidemia   . Osteoporosis   . Overactive bladder   . PMB (postmenopausal bleeding) 11/01/2017  . Stroke [facility])    hemiplegia on left side    Past Surgical History:  Procedure Laterality Date  . CESAREAN SECTION  1975  . COLONOSCOPY  2010   normal    There were no vitals filed for this visit.  Subjective Assessment - 04/01/19 0815    Subjective  Patient states that she is doing well and states that she was able to get online and see her exercises. Patient denies any falls or incidents since her last session.       TREATMENT  Neuromuscular Re-education: Standing R 4" stair taps with PT blocking L knee to prevent hyperextension x20 Standing L 1" stair taps with PT assist as needed x20 Standing R hamstring curl to 1" elevation x20 with PT blocking L knee to prevent hyperextension x20 Standing L hamstring curl to 1" elevation x20 with PT assist to improve end ROM and hamstring recruitment Seated L hamstring curls x45 with PT assist to improve end ROM and hamstring recruitment for  translation to controlled L knee extension in standing  Nu-Step, seat 6, L0 x10 min for improved L knee flexion recruitment via reciprocal motion, BUE/BLE, PT assist for LUE grip maintenance Gait in hallway with intermittent TCs to prevent genu recurvatum Gait outside on incline surface with no need of VCs for posture and foot clearance  Patient educated throughout session on appropriate technique and form using multi-modal cueing, HEP, and activity modification. Patient articulated understanding and returned demonstration.  Patient Response to interventions: Patient reports good workout.  ASSESSMENT Patient presents to clinic with excellent motivation to participate in therapy. Patient demonstrates deficits in balance, posture, gait, LLE strength, and LUE ROM and spasticity/tone. Patient able to achieve improved LLE load with SLS activities during today's session and responded positively to active interventions. Patient will benefit from continued skilled therapeutic intervention to address remaining deficits in balance, posture, gait, LLE strength, and LUE ROM and spasticity/tone  in order to decrease risk of falls, increase function, and improve overall QOL.    PT Long Term Goals - 03/17/19 0818      PT LONG TERM GOAL #1   Title  Patient will be independent with HEP in order to improve strength and balance in order to decrease fall risk and improve function at home and in the community.    Baseline  IE: provided;  03/17/2019: IND    Time  4    Period  Weeks    Status  Achieved      PT LONG TERM GOAL #2   Title  Patient will improve BERG by at least 3 points in order to demonstrate clinically significant improvement in balance.    Baseline  IE: 29/56; 10/27: 35/56    Time  4    Period  Weeks    Status  Partially Met    Target Date  04/14/19      PT LONG TERM GOAL #3   Title  Patient will improve ABC by at least 13% in order to demonstrate clinically significant improvement in  balance confidence.    Baseline  IE: 45.63%; 03/17/2019: 27.18%    Time  4    Period  Weeks    Status  On-going    Target Date  04/14/19      PT LONG TERM GOAL #4   Title  Patient will decrease 5TSTS by at least 3 seconds with SUE support and with limited compensatory patterns in order to demonstrate clinically significant improvement in LE strength.    Baseline  IE: 15.4 sec, RUE support, pushing back against chair for TKE; 10/27: 16.6 sec, RUE, with theraband dorsiflexion assist/knee hyperextension block    Time  4    Period  Weeks    Status  Revised    Target Date  04/14/19      PT LONG TERM GOAL #5   Title  Patient will improve DGI by at least 3 points in order to demonstrate clinically significant improvement in balance and decreased risk for falls.    Baseline  IE: deferred 2/2 to time and fatigue (10/6: 11/19 falls risk); 10/27: 13/19    Time  4    Period  Weeks    Status  On-going    Target Date  04/14/19            Plan - 04/01/19 0834    Clinical Impression Statement  Patient presents to clinic with excellent motivation to participate in therapy. Patient demonstrates deficits in balance, posture, gait, LLE strength, and LUE ROM and spasticity/tone. Patient able to achieve improved LLE load with SLS activities during today's session and responded positively to active interventions. Patient will benefit from continued skilled therapeutic intervention to address remaining deficits in balance, posture, gait, LLE strength, and LUE ROM and spasticity/tone  in order to decrease risk of falls, increase function, and improve overall QOL.    Personal Factors and Comorbidities  Age;Fitness;Behavior Pattern;Past/Current Experience;Comorbidity 3+;Time since onset of injury/illness/exacerbation;Finances    Comorbidities  DM, osteoporosis, HLD, overactive bladder    Examination-Activity Limitations  Bathing;Transfers;Reach Overhead;Squat;Stairs;Carry;Lift;Stand;Locomotion Level;Bend     Examination-Participation Restrictions  Church;Meal Prep;Cleaning;Community Activity;Laundry;Yard Work;Shop    Stability/Clinical Decision Making  Evolving/Moderate complexity    Rehab Potential  Fair    PT Frequency  1x / week    PT Duration  4 weeks    PT Treatment/Interventions  ADLs/Self Care Home Management;Aquatic Therapy;Cryotherapy;Moist Heat;Gait training;Stair training;Functional mobility training;Therapeutic activities;Therapeutic exercise;Balance training;Neuromuscular re-education;Patient/family education;Manual techniques;Passive range of motion;Energy conservation;Taping;Splinting;Orthotic Fit/Training;DME Instruction    PT Next Visit Plan  Gait with brace/orthosis    PT Home Exercise Plan  Seated LAQ, hip adduction squeezes, marches    Consulted and Agree with Plan of Care  Patient       Patient will benefit from skilled therapeutic intervention in order to improve the following deficits and impairments:  Abnormal gait, Decreased coordination,  Decreased range of motion, Difficulty walking, Impaired tone, Impaired UE functional use, Increased muscle spasms, Decreased endurance, Decreased activity tolerance, Improper body mechanics, Hypomobility, Impaired flexibility, Postural dysfunction, Impaired sensation, Decreased strength, Decreased mobility, Decreased balance  Visit Diagnosis: Muscle weakness (generalized)  Other abnormalities of gait and mobility  Difficulty in walking, not elsewhere classified     Problem List Patient Active Problem List   Diagnosis Date Noted  . Type II diabetes mellitus with complication ([place]) 27/73/7505  . Right shoulder pain 03/31/2018  . Hyperlipidemia associated with type 2 diabetes mellitus ([place]) 06/12/2017  . Tinea corporis 03/17/2015  . Abnormal gait 12/15/2014  . Altered bowel function 12/15/2014  . Hemiparesis affecting left side as late effect of cerebrovascular accident (CVA) ([place]) 12/15/2014  . Obstructive sleep apnea of adult  12/15/2014  . Arthritis of shoulder region, degenerative 12/15/2014  . OP (osteoporosis) 12/15/2014  . Allergic rhinitis, seasonal 12/15/2014  . Mixed incontinence 03/11/2013   Myles Gip PT, DPT [id number] 04/01/2019, 10:29 AM  [facility] [address] [place], [place], [address] Phone: [phone]   Fax:  [phone]  Name: MASSIAH LONGANECKER MRN: [id number] Date of Birth: [birth year]

## 2019-04-03 ENCOUNTER — Telehealth: Payer: Self-pay

## 2019-04-03 NOTE — Telephone Encounter (Signed)
Called pt per Dr. Army Melia. Seen patient was scheduled for Monday afternoon to be seen to discuss signing orders for her to get a leg brace. Dr Army Melia already signed these orders last week and they were faxed. However, we are unable to find the copy of the original order because the front desk has already sent off records to the scan center to be scanned into patients chart.  I called the clinic we sent the order to [phone] ( [facility] ) and they said they never received the order for the patient. Asked them to send [place] a new order to resign and fax back, and they are stating they did not send the original order. She said it may have came from Rehab or PT.  Contacted Myles Gip, PT in [place] who is seeing patient currently for treatments to see if she sent the original order so we can have a new order faxed to [place] so we can resign.  Awaiting for her to respond in secure chat but will also send over telephone message for documentation purposes.  Benedict Needy, CMA

## 2019-04-03 NOTE — Telephone Encounter (Signed)
Hello,  Hoping you can possibly help me out with this patient when you have a moment. She is working on trying to get a leg brace from [employer] trader and [employer] ) .Marland Kitchen We recieved a order for this patient last week for Dr Halina Maidens her PCP and we signed the order and faxed it into [facility]. They are stating they never recieved this order for the patient. When I requested a new order to be sent so that we can resign and fax it back to them, they said they never sent [place] the initial order and that it may have came from PT or Rehab. Does these orders come from your PT clinic and if so how can I get another copy sent to [place]?   Thank you for your time and please advise.  Benedict Needy, CMA

## 2019-04-06 ENCOUNTER — Telehealth: Payer: Self-pay | Admitting: Physical Therapy

## 2019-04-06 ENCOUNTER — Telehealth: Payer: Self-pay

## 2019-04-06 ENCOUNTER — Ambulatory Visit: Payer: Medicare Other | Admitting: Internal Medicine

## 2019-04-06 NOTE — Telephone Encounter (Signed)
From Secure Chat to provider and CMA: Hi all, I received your message regarding the order for the AFO. Ms. Branan has the signed order in hand which we received in clinic last week. We provided her with several options for providers and locations to allow her to choose where she'd prefer to go for her orthotics, and the patient was instructed to bring both her order and insurance card to her appointment. I will check with our support staff to see if the order has been scanned into her chart. Thanks for following up.

## 2019-04-06 NOTE — Telephone Encounter (Signed)
Thank you so much for getting back to [place]! I spoke with patient and faxed the order over to [facility] as she requested. They should be contacted her about supplying it. Thank you again!  Benedict Needy, CMA

## 2019-04-06 NOTE — Telephone Encounter (Signed)
Faxed over Leg Brace order form from Dr Army Melia to [place] FAX# [phone].  Benedict Needy, CMA

## 2019-04-07 ENCOUNTER — Encounter: Payer: Self-pay | Admitting: Physical Therapy

## 2019-04-07 ENCOUNTER — Other Ambulatory Visit: Payer: Self-pay

## 2019-04-07 ENCOUNTER — Ambulatory Visit: Payer: Medicare Other | Admitting: Physical Therapy

## 2019-04-07 DIAGNOSIS — M6281 Muscle weakness (generalized): Secondary | ICD-10-CM

## 2019-04-07 DIAGNOSIS — R262 Difficulty in walking, not elsewhere classified: Secondary | ICD-10-CM

## 2019-04-07 DIAGNOSIS — R2689 Other abnormalities of gait and mobility: Secondary | ICD-10-CM

## 2019-04-07 NOTE — Therapy (Signed)
[facility] [address] [place], [place], [address] Phone: [phone]   Fax:  [phone]  Physical Therapy Treatment  Patient Details  Name: Sharon York MRN: [id number] Date of Birth: 21-Mar-1946 Referring Provider (PT): Freda Munro, MD   Encounter Date: 04/07/2019  PT End of Session - 04/07/19 0946    Visit Number  7    Number of Visits  9    Date for PT Re-Evaluation  04/14/19    PT Start Time  0850    PT Stop Time  0936    PT Time Calculation (min)  46 min    Equipment Utilized During Treatment  Gait belt    Activity Tolerance  Patient tolerated treatment well    Behavior During Therapy  [facility] for tasks assessed/performed       Past Medical History:  Diagnosis Date  . Absence of bladder continence 12/15/2014  . Blood pressure elevated without history of HTN 12/15/2014  . Diabetes mellitus without complication ([place])   . Hyperlipidemia   . Osteoporosis   . Overactive bladder   . PMB (postmenopausal bleeding) 11/01/2017  . Stroke [facility])    hemiplegia on left side    Past Surgical History:  Procedure Laterality Date  . CESAREAN SECTION  1975  . COLONOSCOPY  2010   normal    There were no vitals filed for this visit.  Subjective Assessment - 04/07/19 0944    Subjective  Patient reports that she has her appointment with [facility] scheduled for Dec 3. She is looking forward to being fit for an AFO. She notes that she continues to work on her HEP and has been trying to do more standing exercises.    Currently in Pain?  No/denies       TREATMENT  Neuromuscular Re-education: Nu-Step, seat 8, L1 x10 min for improved L knee flexion recruitment via reciprocal motion, BUE/BLE, PT assist for LUE grip maintenance Standing R 1" stair taps with PT blocking L knee to prevent hyperextension x30 Standing L 1" stair taps with PT assist as needed x20 Seated L hip flexion to 1" elevation x20 with faded PT assist to improve  end ROM and hamstring recruitment Seated L hamstring curls x45 with PT assist to improve end ROM and hamstring recruitment for translation to controlled L knee extension in standing  Gait in hallway with intermittent TCs to prevent genu recurvatum Gait outside on incline surface with no need of VCs for posture and foot clearance Car transfer with CGA for LLE flexion into vehicle. Patient with improved independence during this task and demonstrating better L knee flexion and L hip flexion with decreased compensatory mechanisms.   Patient educated throughout session on appropriate technique and form using multi-modal cueing, HEP, and activity modification. Patient articulated understanding and returned demonstration.  Patient Response to interventions: Patient reports L knee feeling looser but a bit fatigued.  ASSESSMENT Patient presents to clinic with excellent motivation to participate in therapy. Patient demonstrates deficits in balance, posture, gait, LLE strength, and LUE ROM and spasticity/tone. Patient continues to be able to achieve increased LLE load bearing with control during today's session and responded positively to active interventions. Patient will benefit from continued skilled therapeutic intervention to address remaining deficits in balance, posture, gait, LLE strength, and LUE ROM and spasticity/tone  in order to decrease risk of falls, increase function, and improve overall QOL.    PT Long Term Goals - 03/17/19 6553  PT LONG TERM GOAL #1   Title  Patient will be independent with HEP in order to improve strength and balance in order to decrease fall risk and improve function at home and in the community.    Baseline  IE: provided; 03/17/2019: IND    Time  4    Period  Weeks    Status  Achieved      PT LONG TERM GOAL #2   Title  Patient will improve BERG by at least 3 points in order to demonstrate clinically significant improvement in balance.    Baseline  IE: 29/56;  10/27: 35/56    Time  4    Period  Weeks    Status  Partially Met    Target Date  04/14/19      PT LONG TERM GOAL #3   Title  Patient will improve ABC by at least 13% in order to demonstrate clinically significant improvement in balance confidence.    Baseline  IE: 45.63%; 03/17/2019: 27.18%    Time  4    Period  Weeks    Status  On-going    Target Date  04/14/19      PT LONG TERM GOAL #4   Title  Patient will decrease 5TSTS by at least 3 seconds with SUE support and with limited compensatory patterns in order to demonstrate clinically significant improvement in LE strength.    Baseline  IE: 15.4 sec, RUE support, pushing back against chair for TKE; 10/27: 16.6 sec, RUE, with theraband dorsiflexion assist/knee hyperextension block    Time  4    Period  Weeks    Status  Revised    Target Date  04/14/19      PT LONG TERM GOAL #5   Title  Patient will improve DGI by at least 3 points in order to demonstrate clinically significant improvement in balance and decreased risk for falls.    Baseline  IE: deferred 2/2 to time and fatigue (10/6: 11/19 falls risk); 10/27: 13/19    Time  4    Period  Weeks    Status  On-going    Target Date  04/14/19            Plan - 04/07/19 0949    Clinical Impression Statement  Patient presents to clinic with excellent motivation to participate in therapy. Patient demonstrates deficits in balance, posture, gait, LLE strength, and LUE ROM and spasticity/tone. Patient continues to be able to achieve increased LLE load bearing with control during today's session and responded positively to active interventions. Patient will benefit from continued skilled therapeutic intervention to address remaining deficits in balance, posture, gait, LLE strength, and LUE ROM and spasticity/tone  in order to decrease risk of falls, increase function, and improve overall QOL.    Personal Factors and Comorbidities  Age;Fitness;Behavior Pattern;Past/Current  Experience;Comorbidity 3+;Time since onset of injury/illness/exacerbation;Finances    Comorbidities  DM, osteoporosis, HLD, overactive bladder    Examination-Activity Limitations  Bathing;Transfers;Reach Overhead;Squat;Stairs;Carry;Lift;Stand;Locomotion Level;Bend    Examination-Participation Restrictions  Church;Meal Prep;Cleaning;Community Activity;Laundry;Yard Work;Shop    Stability/Clinical Decision Making  Evolving/Moderate complexity    Rehab Potential  Fair    PT Frequency  1x / week    PT Duration  4 weeks    PT Treatment/Interventions  ADLs/Self Care Home Management;Aquatic Therapy;Cryotherapy;Moist Heat;Gait training;Stair training;Functional mobility training;Therapeutic activities;Therapeutic exercise;Balance training;Neuromuscular re-education;Patient/family education;Manual techniques;Passive range of motion;Energy conservation;Taping;Splinting;Orthotic Fit/Training;DME Instruction    PT Next Visit Plan  Gait with brace/orthosis    PT Home Exercise  Plan  Seated LAQ, hip adduction squeezes, marches    Consulted and Agree with Plan of Care  Patient       Patient will benefit from skilled therapeutic intervention in order to improve the following deficits and impairments:  Abnormal gait, Decreased coordination, Decreased range of motion, Difficulty walking, Impaired tone, Impaired UE functional use, Increased muscle spasms, Decreased endurance, Decreased activity tolerance, Improper body mechanics, Hypomobility, Impaired flexibility, Postural dysfunction, Impaired sensation, Decreased strength, Decreased mobility, Decreased balance  Visit Diagnosis: Muscle weakness (generalized)  Other abnormalities of gait and mobility  Difficulty in walking, not elsewhere classified     Problem List Patient Active Problem List   Diagnosis Date Noted  . Type II diabetes mellitus with complication ([place]) 52/71/2929  . Right shoulder pain 03/31/2018  . Hyperlipidemia associated with type 2  diabetes mellitus ([place]) 06/12/2017  . Tinea corporis 03/17/2015  . Abnormal gait 12/15/2014  . Altered bowel function 12/15/2014  . Hemiparesis affecting left side as late effect of cerebrovascular accident (CVA) ([place]) 12/15/2014  . Obstructive sleep apnea of adult 12/15/2014  . Arthritis of shoulder region, degenerative 12/15/2014  . OP (osteoporosis) 12/15/2014  . Allergic rhinitis, seasonal 12/15/2014  . Mixed incontinence 03/11/2013   Myles Gip PT, DPT [phone] 04/07/2019, 9:54 AM  [facility] [address] [place], [place], [address] Phone: [phone]   Fax:  [phone]  Name: IANNA SALMELA MRN: [id number] Date of Birth: [birth year]

## 2019-04-14 ENCOUNTER — Encounter: Payer: Self-pay | Admitting: Physical Therapy

## 2019-04-14 ENCOUNTER — Ambulatory Visit: Payer: Medicare Other | Admitting: Physical Therapy

## 2019-04-14 ENCOUNTER — Other Ambulatory Visit: Payer: Self-pay

## 2019-04-14 DIAGNOSIS — M6281 Muscle weakness (generalized): Secondary | ICD-10-CM

## 2019-04-14 DIAGNOSIS — R262 Difficulty in walking, not elsewhere classified: Secondary | ICD-10-CM | POA: Diagnosis not present

## 2019-04-14 DIAGNOSIS — R2689 Other abnormalities of gait and mobility: Secondary | ICD-10-CM

## 2019-04-14 NOTE — Therapy (Signed)
[facility] [address] [place], [place], [address] Phone: [phone]   Fax:  [phone]  Physical Therapy Treatment  Patient Details  Name: Sharon York MRN: [id number] Date of Birth: [birth year] Referring Provider (PT): Freda Munro, MD   Encounter Date: 04/14/2019  PT End of Session - 04/14/19 0807    Visit Number  8    Number of Visits  13    Date for PT Re-Evaluation  05/12/19    PT Start Time  0800    PT Stop Time  0840    PT Time Calculation (min)  40 min    Equipment Utilized During Treatment  Gait belt    Activity Tolerance  Patient tolerated treatment well    Behavior During Therapy  [facility] for tasks assessed/performed       Past Medical History:  Diagnosis Date  . Absence of bladder continence 12/15/2014  . Blood pressure elevated without history of HTN 12/15/2014  . Diabetes mellitus without complication ([place])   . Hyperlipidemia   . Osteoporosis   . Overactive bladder   . PMB (postmenopausal bleeding) 11/01/2017  . Stroke [facility])    hemiplegia on left side    Past Surgical History:  Procedure Laterality Date  . CESAREAN SECTION  1975  . COLONOSCOPY  2010   normal    There were no vitals filed for this visit.  Subjective Assessment - 04/14/19 0804    Subjective  Patient notes that she has been to [facility] a few times to make sure she is familiar with the place. Patient denies any falls or concerns. Patient feels PT is helping and is open to continuing to work on [employer].    Currently in Pain?  No/denies         [facility] PT Assessment - 04/14/19 0001      Berg Balance Test   Sit to Stand  Able to stand  independently using hands    Standing Unsupported  Able to stand 2 minutes with supervision    Sitting with Back Unsupported but Feet Supported on Floor or Stool  Able to sit safely and securely 2 minutes    Stand to Sit  Controls descent by using hands    Transfers  Able to transfer  safely, definite need of hands    Standing Unsupported with Eyes Closed  Able to stand 10 seconds with supervision    Standing Unsupported with Feet Together  Able to place feet together independently but unable to hold for 30 seconds    From Standing, Reach Forward with Outstretched Arm  Can reach confidently >25 cm (10")    From Standing Position, Pick up Object from Floor  Able to pick up shoe safely and easily    From Standing Position, Turn to Look Behind Over each Shoulder  Looks behind one side only/other side shows less weight shift    Turn 360 Degrees  Able to turn 360 degrees safely but slowly    Standing Unsupported, Alternately Place Feet on Step/Stool  Able to complete >2 steps/needs minimal assist    Standing Unsupported, One Foot in Front  Needs help to step but can hold 15 seconds    Standing on One Leg  Unable to try or needs assist to prevent fall    Total Score  36      Dynamic Gait Index   Level Surface  Mild Impairment    Change in Gait Speed  Mild Impairment  Gait with Horizontal Head Turns  Mild Impairment    Gait with Vertical Head Turns  Mild Impairment    Gait and Pivot Turn  Mild Impairment    Step Over Obstacle  Moderate Impairment    Step Around Obstacles  Mild Impairment    Steps  Severe Impairment    Total Score  13        TREATMENT  Therapeutic Exercise: Nu-Step, seat 8, L2-3 x12 min for improved L knee flexion recruitment via reciprocal motion, BLE, target SPM 80-100 Gait in hallway with intermittent TCs to prevent genu recurvatum Gait outside on incline surface with no need of VCs for posture and foot clearance  Neuromuscular Re-education: Reassessed goals; see below.  Standing alternating 3" step taps, SUE support, x20, CGA Car transfer with SBA for LLE flexion into vehicle. Patient with improved independence during this task and demonstrating better L knee flexion and L hip flexion with decreased compensatory mechanisms.  Patient educated  throughout session on appropriate technique and form using multi-modal cueing, HEP, and activity modification. Patient articulated understanding and returned demonstration.  Patient Response to interventions: Patient states that she will probably take a nap later  ASSESSMENT Patient presents to clinic with excellent motivation to participate in therapy. Patient demonstrates deficits in balance, posture, gait, LLE strength, and LUE ROM and spasticity/tone. Patient indicates some improvement in balance confidence since last assessment, but ultimately as likely encountered a ceiling effect with respect to DGI and Berg until patient has appropriate orthotic/brace. Patient responds positively to active interventions and will need therapeutic intervention with the presence of a brace to maximize independence and carryover to home/community. Patient's condition has the potential to improve in response to therapy. Maximum improvement is yet to be obtained. The anticipated improvement is attainable and reasonable in a generally predictable time. Patient will benefit from continued skilled therapeutic intervention to address remaining deficits in balance, posture, gait, LLE strength, and LUE ROM and spasticity/tone  in order to decrease risk of falls, increase function, and improve overall QOL.  PT Long Term Goals - 04/14/19 0809      PT LONG TERM GOAL #1   Title  Patient will be independent with HEP in order to improve strength and balance in order to decrease fall risk and improve function at home and in the community.    Baseline  IE: provided; 03/17/2019: IND    Time  4    Period  Weeks    Status  Achieved      PT LONG TERM GOAL #2   Title  Patient will improve BERG by at least 3 points in order to demonstrate clinically significant improvement in balance.    Baseline  IE: 29/56; 10/27: 35/56; 11/24: 37/56    Time  4    Period  Weeks    Status  Partially Met    Target Date  05/12/19      PT LONG  TERM GOAL #3   Title  Patient will improve ABC by at least 13% in order to demonstrate clinically significant improvement in balance confidence.    Baseline  IE: 45.63%; 03/17/2019: 27.18%; 11/24: 37.5%    Time  4    Period  Weeks    Status  On-going    Target Date  05/12/19      PT LONG TERM GOAL #4   Title  Patient will decrease 5TSTS by at least 3 seconds with SUE support and with limited compensatory patterns in order to demonstrate clinically  significant improvement in LE strength.    Baseline  IE: 15.4 sec, RUE support, pushing back against chair for TKE; 10/27: 16.6 sec, RUE, with theraband dorsiflexion assist/knee hyperextension block; 11/24: 19.8 sec, RUE, no brace, no compensatory leverage mechanism at posterior knee    Time  4    Period  Weeks    Status  Revised    Target Date  05/12/19      PT LONG TERM GOAL #5   Title  Patient will improve DGI by at least 3 points in order to demonstrate clinically significant improvement in balance and decreased risk for falls.    Baseline  IE: deferred 2/2 to time and fatigue (10/6: 11/19 falls risk); 10/27: 13/19; 11/24: 13/19    Time  4    Period  Weeks    Status  Deferred    Target Date  05/12/19            Plan - 04/14/19 8115    Clinical Impression Statement  Patient presents to clinic with excellent motivation to participate in therapy. Patient demonstrates deficits in balance, posture, gait, LLE strength, and LUE ROM and spasticity/tone. Patient indicates some improvement in balance confidence since last assessment, but ultimately as likely encountered a ceiling effect with respect to DGI and Berg until patient has appropriate orthotic/brace. Patient responds positively to active interventions and will need therapeutic intervention with the presence of a brace to maximize independence and carryover to home/community. Patient's condition has the potential to improve in response to therapy. Maximum improvement is yet to be  obtained. The anticipated improvement is attainable and reasonable in a generally predictable time. Patient will benefit from continued skilled therapeutic intervention to address remaining deficits in balance, posture, gait, LLE strength, and LUE ROM and spasticity/tone  in order to decrease risk of falls, increase function, and improve overall QOL.    Personal Factors and Comorbidities  Age;Fitness;Behavior Pattern;Past/Current Experience;Comorbidity 3+;Time since onset of injury/illness/exacerbation;Finances    Comorbidities  DM, osteoporosis, HLD, overactive bladder    Examination-Activity Limitations  Bathing;Transfers;Reach Overhead;Squat;Stairs;Carry;Lift;Stand;Locomotion Level;Bend    Examination-Participation Restrictions  Church;Meal Prep;Cleaning;Community Activity;Laundry;Yard Work;Shop    Stability/Clinical Decision Making  Evolving/Moderate complexity    Rehab Potential  Fair    PT Frequency  1x / week    PT Duration  4 weeks    PT Treatment/Interventions  ADLs/Self Care Home Management;Aquatic Therapy;Cryotherapy;Moist Heat;Gait training;Stair training;Functional mobility training;Therapeutic activities;Therapeutic exercise;Balance training;Neuromuscular re-education;Patient/family education;Manual techniques;Passive range of motion;Energy conservation;Taping;Splinting;Orthotic Fit/Training;DME Instruction    PT Next Visit Plan  Gait with brace/orthosis    PT Home Exercise Plan  Seated LAQ, hip adduction squeezes, marches    Consulted and Agree with Plan of Care  Patient       Patient will benefit from skilled therapeutic intervention in order to improve the following deficits and impairments:  Abnormal gait, Decreased coordination, Decreased range of motion, Difficulty walking, Impaired tone, Impaired UE functional use, Increased muscle spasms, Decreased endurance, Decreased activity tolerance, Improper body mechanics, Hypomobility, Impaired flexibility, Postural dysfunction, Impaired  sensation, Decreased strength, Decreased mobility, Decreased balance  Visit Diagnosis: Muscle weakness (generalized)  Other abnormalities of gait and mobility  Difficulty in walking, not elsewhere classified     Problem List Patient Active Problem List   Diagnosis Date Noted  . Type II diabetes mellitus with complication ([place]) 72/62/0355  . Right shoulder pain 03/31/2018  . Hyperlipidemia associated with type 2 diabetes mellitus ([place]) 06/12/2017  . Tinea corporis 03/17/2015  . Abnormal gait 12/15/2014  .  Altered bowel function 12/15/2014  . Hemiparesis affecting left side as late effect of cerebrovascular accident (CVA) ([place]) 12/15/2014  . Obstructive sleep apnea of adult 12/15/2014  . Arthritis of shoulder region, degenerative 12/15/2014  . OP (osteoporosis) 12/15/2014  . Allergic rhinitis, seasonal 12/15/2014  . Mixed incontinence 03/11/2013   Myles Gip PT, DPT [phone]   04/14/2019, 10:28 AM  [place] [facility] [address] [place], [place], [address] Phone: [phone]   Fax:  [phone]  Name: DEXTER SAUSER MRN: [id number] Date of Birth: 16-Nov-1945

## 2019-04-29 ENCOUNTER — Encounter: Payer: Self-pay | Admitting: Physical Therapy

## 2019-04-29 ENCOUNTER — Ambulatory Visit: Payer: Medicare Other | Attending: Internal Medicine | Admitting: Physical Therapy

## 2019-04-29 ENCOUNTER — Other Ambulatory Visit: Payer: Self-pay

## 2019-04-29 DIAGNOSIS — R262 Difficulty in walking, not elsewhere classified: Secondary | ICD-10-CM | POA: Diagnosis not present

## 2019-04-29 DIAGNOSIS — M6281 Muscle weakness (generalized): Secondary | ICD-10-CM | POA: Diagnosis not present

## 2019-04-29 DIAGNOSIS — R2689 Other abnormalities of gait and mobility: Secondary | ICD-10-CM | POA: Diagnosis not present

## 2019-04-29 NOTE — Therapy (Signed)
[facility] [address] [place], [place], [address] Phone: [phone]   Fax:  [phone]  Physical Therapy Treatment  Patient Details  Name: Sharon York MRN: [id number] Date of Birth: [birth year] Referring Provider (PT): Freda Munro, MD   Encounter Date: 04/29/2019  PT End of Session - 04/29/19 0941    Visit Number  9    Number of Visits  13    Date for PT Re-Evaluation  05/12/19    PT Start Time  0757    PT Stop Time  0901    PT Time Calculation (min)  64 min    Equipment Utilized During Treatment  Gait belt    Activity Tolerance  Patient tolerated treatment well    Behavior During Therapy  [facility] for tasks assessed/performed       Past Medical History:  Diagnosis Date  . Absence of bladder continence 12/15/2014  . Blood pressure elevated without history of HTN 12/15/2014  . Diabetes mellitus without complication ([place])   . Hyperlipidemia   . Osteoporosis   . Overactive bladder   . PMB (postmenopausal bleeding) 11/01/2017  . Stroke [facility])    hemiplegia on left side    Past Surgical History:  Procedure Laterality Date  . CESAREAN SECTION  1975  . COLONOSCOPY  2010   normal    There were no vitals filed for this visit.  Subjective Assessment - 04/29/19 0921    Subjective  Patient reports a successful visit to the orthotist. She states that she will have her brace next Thursday and will also receive her bike pedaler for home use. She states that the brace she tried on atthe orthotist was very comfortable and she felt very steady. Patient reports continuing to do her exercises at home. She does note some increased pain adn stiffness in her R hand today making it difficult to grip her AD.    Currently in Pain?  Yes    Pain Score  3     Pain Location  Hand    Pain Orientation  Right    Pain Descriptors / Indicators  Tightness;Aching       TREATMENT Manual therapy: R hand gentle mobilizations of 1st-3rd digit with  extension mobilizations with movement for decreased pain and improved grip strength STM of thenar eminence for decreased pain and improved grip strength  Neuromuscular Re-education: Nu-Step, seat 6, L1 x10 min for improved L knee flexion recruitment via reciprocal motion, patient positioned to maintain L knee flexion to prevent hyperextension thrust Standing R 1" stair taps with PT min blocking L knee to prevent hyperextension x30 Standing L 1" stair taps with no PT assist as needed x20, VCs required to prevent hip circumduction strategy Standing R 2" stair taps with PT min blocking L knee to prevent hyperextension x30 Standing L 2" stair taps with no PT assist as needed x20, VCs required to prevent hip circumduction strategy Standing weight shifts with L knee flexion moment for improved control of LLE WB x50 Standing L 3" stair taps with PT assist as needed x20, VCs required to prevent hip circumduction strategy Seated L hamstring curls x45 with PT assist to improve end ROM and hamstring recruitment for translation to controlled L knee extension in standing  Gait in hallway with intermittent TCs to prevent genu recurvatum Gait outside on incline surface with no need of VCs for posture and foot clearance Car transfer Mod IND for LLE flexion into vehicle. Patient with continued improved  independence during this task and demonstrating better L knee flexion and L hip flexion with decreased compensatory mechanisms.   Patient educated throughout session on appropriate technique and form using multi-modal cueing, HEP, and activity modification. Patient articulated understanding and returned demonstration.  Patient Response to interventions: Patient reports L knee feeling looser but a bit fatigued.  ASSESSMENT Patient presents to clinic with excellent motivation to participate in therapy. Patient demonstrates deficits in balance, posture, gait, LLE strength, and LUE ROM and spasticity/tone. Patient  continues to be able to achieve increased LLE load bearing with control during today's session with notably few hyperextension thrust moments in LLE WB and responded positively to active interventions. Patient will benefit from continued skilled therapeutic intervention to address remaining deficits in balance, posture, gait, LLE strength, and LUE ROM and spasticity/tone  in order to decrease risk of falls, increase function, and improve overall QOL.   PT Long Term Goals - 04/14/19 0809      PT LONG TERM GOAL #1   Title  Patient will be independent with HEP in order to improve strength and balance in order to decrease fall risk and improve function at home and in the community.    Baseline  IE: provided; 03/17/2019: IND    Time  4    Period  Weeks    Status  Achieved      PT LONG TERM GOAL #2   Title  Patient will improve BERG by at least 3 points in order to demonstrate clinically significant improvement in balance.    Baseline  IE: 29/56; 10/27: 35/56; 11/24: 37/56    Time  4    Period  Weeks    Status  Partially Met    Target Date  05/12/19      PT LONG TERM GOAL #3   Title  Patient will improve ABC by at least 13% in order to demonstrate clinically significant improvement in balance confidence.    Baseline  IE: 45.63%; 03/17/2019: 27.18%; 11/24: 37.5%    Time  4    Period  Weeks    Status  On-going    Target Date  05/12/19      PT LONG TERM GOAL #4   Title  Patient will decrease 5TSTS by at least 3 seconds with SUE support and with limited compensatory patterns in order to demonstrate clinically significant improvement in LE strength.    Baseline  IE: 15.4 sec, RUE support, pushing back against chair for TKE; 10/27: 16.6 sec, RUE, with theraband dorsiflexion assist/knee hyperextension block; 11/24: 19.8 sec, RUE, no brace, no compensatory leverage mechanism at posterior knee    Time  4    Period  Weeks    Status  Revised    Target Date  05/12/19      PT LONG TERM GOAL #5    Title  Patient will improve DGI by at least 3 points in order to demonstrate clinically significant improvement in balance and decreased risk for falls.    Baseline  IE: deferred 2/2 to time and fatigue (10/6: 11/19 falls risk); 10/27: 13/19; 11/24: 13/19    Time  4    Period  Weeks    Status  Deferred    Target Date  05/12/19            Plan - 04/29/19 0941    Clinical Impression Statement  Patient presents to clinic with excellent motivation to participate in therapy. Patient demonstrates deficits in balance, posture, gait, LLE strength, and LUE  ROM and spasticity/tone. Patient continues to be able to achieve increased LLE load bearing with control during today's session with notably few hyperextension thrust moments in LLE WB and responded positively to active interventions. Patient will benefit from continued skilled therapeutic intervention to address remaining deficits in balance, posture, gait, LLE strength, and LUE ROM and spasticity/tone  in order to decrease risk of falls, increase function, and improve overall QOL.    Personal Factors and Comorbidities  Age;Fitness;Behavior Pattern;Past/Current Experience;Comorbidity 3+;Time since onset of injury/illness/exacerbation;Finances    Comorbidities  DM, osteoporosis, HLD, overactive bladder    Examination-Activity Limitations  Bathing;Transfers;Reach Overhead;Squat;Stairs;Carry;Lift;Stand;Locomotion Level;Bend    Examination-Participation Restrictions  Church;Meal Prep;Cleaning;Community Activity;Laundry;Yard Work;Shop    Stability/Clinical Decision Making  Evolving/Moderate complexity    Rehab Potential  Fair    PT Frequency  1x / week    PT Duration  4 weeks    PT Treatment/Interventions  ADLs/Self Care Home Management;Aquatic Therapy;Cryotherapy;Moist Heat;Gait training;Stair training;Functional mobility training;Therapeutic activities;Therapeutic exercise;Balance training;Neuromuscular re-education;Patient/family education;Manual  techniques;Passive range of motion;Energy conservation;Taping;Splinting;Orthotic Fit/Training;DME Instruction    PT Next Visit Plan  Gait with brace/orthosis    PT Home Exercise Plan  Seated LAQ, hip adduction squeezes, marches    Consulted and Agree with Plan of Care  Patient       Patient will benefit from skilled therapeutic intervention in order to improve the following deficits and impairments:  Abnormal gait, Decreased coordination, Decreased range of motion, Difficulty walking, Impaired tone, Impaired UE functional use, Increased muscle spasms, Decreased endurance, Decreased activity tolerance, Improper body mechanics, Hypomobility, Impaired flexibility, Postural dysfunction, Impaired sensation, Decreased strength, Decreased mobility, Decreased balance  Visit Diagnosis: Muscle weakness (generalized)  Other abnormalities of gait and mobility  Difficulty in walking, not elsewhere classified     Problem List Patient Active Problem List   Diagnosis Date Noted  . Type II diabetes mellitus with complication ([place]) 09/32/6712  . Right shoulder pain 03/31/2018  . Hyperlipidemia associated with type 2 diabetes mellitus ([place]) 06/12/2017  . Tinea corporis 03/17/2015  . Abnormal gait 12/15/2014  . Altered bowel function 12/15/2014  . Hemiparesis affecting left side as late effect of cerebrovascular accident (CVA) ([place]) 12/15/2014  . Obstructive sleep apnea of adult 12/15/2014  . Arthritis of shoulder region, degenerative 12/15/2014  . OP (osteoporosis) 12/15/2014  . Allergic rhinitis, seasonal 12/15/2014  . Mixed incontinence 03/11/2013   Myles Gip PT, DPT [phone] 04/29/2019, 9:49 AM  [facility] [address] [place], [place], [address] Phone: [phone]   Fax:  [phone]  Name: Sharon York MRN: [id number] Date of Birth: Feb 07, 1946

## 2019-05-06 ENCOUNTER — Ambulatory Visit: Payer: Medicare Other | Admitting: Physical Therapy

## 2019-05-06 ENCOUNTER — Other Ambulatory Visit: Payer: Self-pay

## 2019-05-06 ENCOUNTER — Encounter: Payer: Self-pay | Admitting: Physical Therapy

## 2019-05-06 DIAGNOSIS — R2689 Other abnormalities of gait and mobility: Secondary | ICD-10-CM

## 2019-05-06 DIAGNOSIS — R262 Difficulty in walking, not elsewhere classified: Secondary | ICD-10-CM | POA: Diagnosis not present

## 2019-05-06 DIAGNOSIS — M6281 Muscle weakness (generalized): Secondary | ICD-10-CM

## 2019-05-06 NOTE — Therapy (Signed)
[facility] [address] [place], [place], [address] Phone: [phone]   Fax:  [phone]  Physical Therapy Treatment  Patient Details  Name: Sharon York MRN: [id number] Date of Birth: 1946/04/03 Referring Provider (PT): Freda Munro, MD   Encounter Date: 05/06/2019  PT End of Session - 05/06/19 1505    Visit Number  10    Number of Visits  13    Date for PT Re-Evaluation  05/12/19    PT Start Time  4825    PT Stop Time  1540    PT Time Calculation (min)  42 min    Equipment Utilized During Treatment  Gait belt    Activity Tolerance  Patient tolerated treatment well;Patient limited by fatigue    Behavior During Therapy  [facility] for tasks assessed/performed       Past Medical History:  Diagnosis Date  . Absence of bladder continence 12/15/2014  . Blood pressure elevated without history of HTN 12/15/2014  . Diabetes mellitus without complication ([place])   . Hyperlipidemia   . Osteoporosis   . Overactive bladder   . PMB (postmenopausal bleeding) 11/01/2017  . Stroke [facility])    hemiplegia on left side    Past Surgical History:  Procedure Laterality Date  . CESAREAN SECTION  1975  . COLONOSCOPY  2010   normal    There were no vitals filed for this visit.  Subjective Assessment - 05/06/19 1502    Subjective  Patient notes that she will be getting her orthosis tomorrow. She also notes that she got her pedaler for at home and has been cycling while she sits to get more strength in her legs.    Currently in Pain?  No/denies       TREATMENT Manual therapy: R hand gentle mobilizations of 1st-3rd digit with extension mobilizations with movement for decreased pain and improved grip strength STM of thenar eminence for decreased pain and improved grip strength  Neuromuscular Re-education: Nu-Step, seat 6, L2 x10 min for improved L knee flexion recruitment via reciprocal motion, patient positioned to maintain L knee flexion to  prevent hyperextension thrust Standing R 1" stair taps with PT min blocking L knee to prevent hyperextension x30 Standing L 1" stair taps with no PT assist as needed x40, VCs required to prevent hip circumduction strategy Standing R 2" stair taps with PT min blocking L knee to prevent hyperextension x30 Standing L 2" stair taps with no PT assist as needed x60, VCs required to prevent hip circumduction strategy Standing weight shifts with L knee flexion moment for improved control of LLE WB x50 Gait outside on incline surface with no need of VCs for posture and foot clearance Car transfer Mod IND for LLE flexion into vehicle. Patient with continued improved independence during this task and demonstrating better L knee flexion and L hip flexion with decreased compensatory mechanisms.   Patient educated throughout session on appropriate technique and form using multi-modal cueing, HEP, and activity modification. Patient articulated understanding and returned demonstration.  Patient Response to interventions: Patient reports feeling fatigued.  ASSESSMENT Patient presents to clinic with excellent motivation to participate in therapy. Patient demonstrates deficits in balance, posture, gait, LLE strength, and LUE ROM and spasticity/tone. Patient continues to be able to achieve increased LLE load bearing with control and demonstrating functional L knee flexion for car transfer and stair taps during today's session with notably few hyperextension thrust moments in LLE WB and responded positively to active interventions.  Patient will benefit from continued skilled therapeutic intervention to address remaining deficits in balance, posture, gait, LLE strength, and LUE ROM and spasticity/tone  in order to decrease risk of falls, increase function, and improve overall QOL.   PT Long Term Goals - 04/14/19 0809      PT LONG TERM GOAL #1   Title  Patient will be independent with HEP in order to improve strength  and balance in order to decrease fall risk and improve function at home and in the community.    Baseline  IE: provided; 03/17/2019: IND    Time  4    Period  Weeks    Status  Achieved      PT LONG TERM GOAL #2   Title  Patient will improve BERG by at least 3 points in order to demonstrate clinically significant improvement in balance.    Baseline  IE: 29/56; 10/27: 35/56; 11/24: 37/56    Time  4    Period  Weeks    Status  Partially Met    Target Date  05/12/19      PT LONG TERM GOAL #3   Title  Patient will improve ABC by at least 13% in order to demonstrate clinically significant improvement in balance confidence.    Baseline  IE: 45.63%; 03/17/2019: 27.18%; 11/24: 37.5%    Time  4    Period  Weeks    Status  On-going    Target Date  05/12/19      PT LONG TERM GOAL #4   Title  Patient will decrease 5TSTS by at least 3 seconds with SUE support and with limited compensatory patterns in order to demonstrate clinically significant improvement in LE strength.    Baseline  IE: 15.4 sec, RUE support, pushing back against chair for TKE; 10/27: 16.6 sec, RUE, with theraband dorsiflexion assist/knee hyperextension block; 11/24: 19.8 sec, RUE, no brace, no compensatory leverage mechanism at posterior knee    Time  4    Period  Weeks    Status  Revised    Target Date  05/12/19      PT LONG TERM GOAL #5   Title  Patient will improve DGI by at least 3 points in order to demonstrate clinically significant improvement in balance and decreased risk for falls.    Baseline  IE: deferred 2/2 to time and fatigue (10/6: 11/19 falls risk); 10/27: 13/19; 11/24: 13/19    Time  4    Period  Weeks    Status  Deferred    Target Date  05/12/19            Plan - 05/06/19 1506    Clinical Impression Statement  Patient presents to clinic with excellent motivation to participate in therapy. Patient demonstrates deficits in balance, posture, gait, LLE strength, and LUE ROM and spasticity/tone. Patient  continues to be able to achieve increased LLE load bearing with control and demonstrating functional L knee flexion for car transfer and stair taps during today's session with notably few hyperextension thrust moments in LLE WB and responded positively to active interventions. Patient will benefit from continued skilled therapeutic intervention to address remaining deficits in balance, posture, gait, LLE strength, and LUE ROM and spasticity/tone  in order to decrease risk of falls, increase function, and improve overall QOL.    Personal Factors and Comorbidities  Age;Fitness;Behavior Pattern;Past/Current Experience;Comorbidity 3+;Time since onset of injury/illness/exacerbation;Finances    Comorbidities  DM, osteoporosis, HLD, overactive bladder    Examination-Activity Limitations  Bathing;Transfers;Reach Overhead;Squat;Stairs;Carry;Lift;Stand;Locomotion Level;Bend    Examination-Participation Restrictions  Church;Meal Prep;Cleaning;Community Activity;Laundry;Yard Work;Shop    Stability/Clinical Decision Making  Evolving/Moderate complexity    Rehab Potential  Fair    PT Frequency  1x / week    PT Duration  4 weeks    PT Treatment/Interventions  ADLs/Self Care Home Management;Aquatic Therapy;Cryotherapy;Moist Heat;Gait training;Stair training;Functional mobility training;Therapeutic activities;Therapeutic exercise;Balance training;Neuromuscular re-education;Patient/family education;Manual techniques;Passive range of motion;Energy conservation;Taping;Splinting;Orthotic Fit/Training;DME Instruction    PT Next Visit Plan  Gait with brace/orthosis    PT Home Exercise Plan  Seated LAQ, hip adduction squeezes, marches    Consulted and Agree with Plan of Care  Patient       Patient will benefit from skilled therapeutic intervention in order to improve the following deficits and impairments:  Abnormal gait, Decreased coordination, Decreased range of motion, Difficulty walking, Impaired tone, Impaired UE  functional use, Increased muscle spasms, Decreased endurance, Decreased activity tolerance, Improper body mechanics, Hypomobility, Impaired flexibility, Postural dysfunction, Impaired sensation, Decreased strength, Decreased mobility, Decreased balance  Visit Diagnosis: Muscle weakness (generalized)  Other abnormalities of gait and mobility  Difficulty in walking, not elsewhere classified     Problem List Patient Active Problem List   Diagnosis Date Noted  . Type II diabetes mellitus with complication ([place]) 95/74/7340  . Right shoulder pain 03/31/2018  . Hyperlipidemia associated with type 2 diabetes mellitus ([place]) 06/12/2017  . Tinea corporis 03/17/2015  . Abnormal gait 12/15/2014  . Altered bowel function 12/15/2014  . Hemiparesis affecting left side as late effect of cerebrovascular accident (CVA) ([place]) 12/15/2014  . Obstructive sleep apnea of adult 12/15/2014  . Arthritis of shoulder region, degenerative 12/15/2014  . OP (osteoporosis) 12/15/2014  . Allergic rhinitis, seasonal 12/15/2014  . Mixed incontinence 03/11/2013   Myles Gip PT, DPT [id number] 05/06/2019, 5:16 PM  [facility] [address] [place], [place], [address] Phone: [phone]   Fax:  [phone]  Name: Sharon York MRN: [id number] Date of Birth: [birth year]

## 2019-05-07 DIAGNOSIS — M21372 Foot drop, left foot: Secondary | ICD-10-CM | POA: Diagnosis not present

## 2019-05-13 ENCOUNTER — Other Ambulatory Visit: Payer: Self-pay

## 2019-05-13 ENCOUNTER — Ambulatory Visit: Payer: Medicare Other | Admitting: Physical Therapy

## 2019-05-13 ENCOUNTER — Encounter: Payer: Self-pay | Admitting: Physical Therapy

## 2019-05-13 DIAGNOSIS — R2689 Other abnormalities of gait and mobility: Secondary | ICD-10-CM

## 2019-05-13 DIAGNOSIS — R262 Difficulty in walking, not elsewhere classified: Secondary | ICD-10-CM

## 2019-05-13 DIAGNOSIS — M6281 Muscle weakness (generalized): Secondary | ICD-10-CM | POA: Diagnosis not present

## 2019-05-13 NOTE — Therapy (Signed)
[facility] [address]. [place], [place], [address] Phone: [phone]   Fax:  [phone]  Physical Therapy Treatment  Patient Details  Name: Sharon York MRN: [id number] Date of Birth: 13-Apr-1946 Referring Provider (PT): Freda Munro, MD   Encounter Date: 05/13/2019  PT End of Session - 05/13/19 1012    Visit Number  11    Number of Visits  13    Date for PT Re-Evaluation  06/10/19    PT Start Time  0819    PT Stop Time  0900    PT Time Calculation (min)  41 min    Equipment Utilized During Treatment  Gait belt    Activity Tolerance  Patient tolerated treatment well;Patient limited by fatigue    Behavior During Therapy  [facility] for tasks assessed/performed       Past Medical History:  Diagnosis Date  . Absence of bladder continence 12/15/2014  . Blood pressure elevated without history of HTN 12/15/2014  . Diabetes mellitus without complication ([place])   . Hyperlipidemia   . Osteoporosis   . Overactive bladder   . PMB (postmenopausal bleeding) 11/01/2017  . Stroke [facility])    hemiplegia on left side    Past Surgical History:  Procedure Laterality Date  . CESAREAN SECTION  1975  . COLONOSCOPY  2010   normal    There were no vitals filed for this visit.  Subjective Assessment - 05/13/19 1009    Subjective  Patient presents to clinic 20 min late this morning. She notes that her R hand continues to give her trouble (stiff and sore) and she has set an appointment with her MD to discuss this. Patient is wearing new DF assist orthosis and states that she feels it is helpful, but she does get fatigued from wearing it.    Currently in Pain?  No/denies         [facility] PT Assessment - 05/13/19 0001      Berg Balance Test   Sit to Stand  Able to stand  independently using hands    Standing Unsupported  Able to stand safely 2 minutes    Sitting with Back Unsupported but Feet Supported on Floor or Stool  Able to sit safely and  securely 2 minutes    Stand to Sit  Controls descent by using hands    Transfers  Able to transfer safely, definite need of hands    Standing Unsupported with Eyes Closed  Able to stand 10 seconds with supervision    Standing Unsupported with Feet Together  Able to place feet together independently but unable to hold for 30 seconds    From Standing, Reach Forward with Outstretched Arm  Can reach confidently >25 cm (10")    From Standing Position, Pick up Object from Floor  Able to pick up shoe safely and easily    From Standing Position, Turn to Look Behind Over each Shoulder  Looks behind one side only/other side shows less weight shift    Turn 360 Degrees  Able to turn 360 degrees safely but slowly    Standing Unsupported, Alternately Place Feet on Step/Stool  Able to complete >2 steps/needs minimal assist    Standing Unsupported, One Foot in Front  Needs help to step but can hold 15 seconds    Standing on One Leg  Tries to lift leg/unable to hold 3 seconds but remains standing independently    Total Score  38  TREATMENT Manual therapy: R hand gentle mobilizations of 1st-3rd digit with extension mobilizations with movement for decreased pain and improved grip strength STM of thenar eminence for decreased pain and improved grip strength  Therapeutic Exercise:  Nu-Step, seat 6, L2 x10 min for improved L knee flexion recruitment via reciprocal motion, patient positioned to maintain L knee flexion to prevent hyperextension thrust  Neuromuscular Re-education:  Reassessed goals; see below. Gait in hallway with orthosis; patient has much improved foot clearance, but continues to have hyperextension thrust with load acceptance. Standing L 1" stair taps with no PT assist as needed x20, VCs required to prevent hip circumduction strategy Standing L 2" stair taps with no PT assist as needed x30, VCs required to prevent hip circumduction strategy Standing L 4" stair taps with min PT assist, VCs  and TCs to prevent hip circumduction and trunk lateral flexion Gait outside on incline surface with no need of VCs for posture and foot clearance Car transfer Mod IND for LLE flexion into vehicle. Patient with continued improved independence during this task and demonstrating better L knee flexion and L hip flexion with decreased compensatory mechanisms.   Patient educated throughout session on appropriate technique and form using multi-modal cueing, HEP, and activity modification. Patient articulated understanding and returned demonstration.  Patient Response to interventions: Patient reports feeling fatigued.  ASSESSMENT Patient presents to clinic with excellent motivation to participate in therapy. Patient demonstrates deficits in balance, posture, gait, LLE strength, and LUE ROM and spasticity/tone. Patient continues to be able to achieve increased LLE load bearing with control and demonstrating functional L knee flexion for car transfer and stair taps during today's session with notably few hyperextension thrust moments in LLE WB and responded positively to active interventions. Patient's orthosis has greatly improved foot clearance during gait, however L knee hyperextension thrust continues to be a problem with load acceptance during gait. Patient's condition has the potential to improve in response to therapy. Maximum improvement is yet to be obtained. The anticipated improvement is attainable and reasonable in a generally predictable time. Patient will benefit from continued skilled therapeutic intervention to address remaining deficits in balance, posture, gait, LLE strength, and LUE ROM and spasticity/tone  in order to decrease risk of falls, increase function, and improve overall QOL.    PT Long Term Goals - 05/13/19 1014      PT LONG TERM GOAL #1   Title  Patient will be independent with HEP in order to improve strength and balance in order to decrease fall risk and improve function at  home and in the community.    Baseline  IE: provided; 03/17/2019: IND    Time  4    Period  Weeks    Status  Achieved      PT LONG TERM GOAL #2   Title  Patient will improve BERG by at least 3 points in order to demonstrate clinically significant improvement in balance.    Baseline  IE: 29/56; 10/27: 35/56; 11/24: 37/56; 12/23: 38/56    Time  4    Period  Weeks    Status  Partially Met    Target Date  06/10/19      PT LONG TERM GOAL #3   Title  Patient will improve ABC by at least 13% in order to demonstrate clinically significant improvement in balance confidence.    Baseline  IE: 45.63%; 03/17/2019: 27.18%; 11/24: 37.5%; 12/23: deferred 2/2 time    Time  4    Period  Weeks  Status  On-going    Target Date  06/10/19      PT LONG TERM GOAL #4   Title  Patient will decrease 5TSTS by at least 3 seconds with SUE support and with limited compensatory patterns in order to demonstrate clinically significant improvement in LE strength.    Baseline  IE: 15.4 sec, RUE support, pushing back against chair for TKE; 10/27: 16.6 sec, RUE, with theraband dorsiflexion assist/knee hyperextension block; 11/24: 19.8 sec, RUE, no brace, no compensatory leverage mechanism at posterior knee; 12/23: deferred 2/2 to time    Time  4    Period  Weeks    Status  Revised    Target Date  06/10/19      PT LONG TERM GOAL #5   Title  Patient will improve DGI by at least 3 points in order to demonstrate clinically significant improvement in balance and decreased risk for falls.    Baseline  IE: deferred 2/2 to time and fatigue (10/6: 11/19 falls risk); 10/27: 13/19; 11/24: 13/19    Time  4    Period  Weeks    Status  Deferred            Plan - 05/13/19 1013    Clinical Impression Statement  Patient presents to clinic with excellent motivation to participate in therapy. Patient demonstrates deficits in balance, posture, gait, LLE strength, and LUE ROM and spasticity/tone. Patient continues to be able to  achieve increased LLE load bearing with control and demonstrating functional L knee flexion for car transfer and stair taps during today's session with notably few hyperextension thrust moments in LLE WB and responded positively to active interventions. Patient's orthosis has greatly improved foot clearance during gait, however L knee hyperextension thrust continues to be a problem with load acceptance during gait. Patient's condition has the potential to improve in response to therapy. Maximum improvement is yet to be obtained. The anticipated improvement is attainable and reasonable in a generally predictable time. Patient will benefit from continued skilled therapeutic intervention to address remaining deficits in balance, posture, gait, LLE strength, and LUE ROM and spasticity/tone  in order to decrease risk of falls, increase function, and improve overall QOL.    Personal Factors and Comorbidities  Age;Fitness;Behavior Pattern;Past/Current Experience;Comorbidity 3+;Time since onset of injury/illness/exacerbation;Finances    Comorbidities  DM, osteoporosis, HLD, overactive bladder    Examination-Activity Limitations  Bathing;Transfers;Reach Overhead;Squat;Stairs;Carry;Lift;Stand;Locomotion Level;Bend    Examination-Participation Restrictions  Church;Meal Prep;Cleaning;Community Activity;Laundry;Yard Work;Shop    Stability/Clinical Decision Making  Evolving/Moderate complexity    Rehab Potential  Fair    PT Frequency  1x / week    PT Duration  4 weeks    PT Treatment/Interventions  ADLs/Self Care Home Management;Aquatic Therapy;Cryotherapy;Moist Heat;Gait training;Stair training;Functional mobility training;Therapeutic activities;Therapeutic exercise;Balance training;Neuromuscular re-education;Patient/family education;Manual techniques;Passive range of motion;Energy conservation;Taping;Splinting;Orthotic Fit/Training;DME Instruction    PT Next Visit Plan  Gait with brace/orthosis    PT Home Exercise  Plan  Seated LAQ, hip adduction squeezes, marches    Consulted and Agree with Plan of Care  Patient       Patient will benefit from skilled therapeutic intervention in order to improve the following deficits and impairments:  Abnormal gait, Decreased coordination, Decreased range of motion, Difficulty walking, Impaired tone, Impaired UE functional use, Increased muscle spasms, Decreased endurance, Decreased activity tolerance, Improper body mechanics, Hypomobility, Impaired flexibility, Postural dysfunction, Impaired sensation, Decreased strength, Decreased mobility, Decreased balance  Visit Diagnosis: Muscle weakness (generalized)  Other abnormalities of gait and mobility  Difficulty in  walking, not elsewhere classified     Problem List Patient Active Problem List   Diagnosis Date Noted  . Type II diabetes mellitus with complication ([place]) 33/43/5686  . Right shoulder pain 03/31/2018  . Hyperlipidemia associated with type 2 diabetes mellitus ([place]) 06/12/2017  . Tinea corporis 03/17/2015  . Abnormal gait 12/15/2014  . Altered bowel function 12/15/2014  . Hemiparesis affecting left side as late effect of cerebrovascular accident (CVA) ([place]) 12/15/2014  . Obstructive sleep apnea of adult 12/15/2014  . Arthritis of shoulder region, degenerative 12/15/2014  . OP (osteoporosis) 12/15/2014  . Allergic rhinitis, seasonal 12/15/2014  . Mixed incontinence 03/11/2013   Myles Gip PT, DPT [phone] 05/13/2019, 10:36 AM  [facility] [address] [place], [place], [address] Phone: [phone]   Fax:  [phone]  Name: Sharon York MRN: [id number] Date of Birth: 31-Jul-1945

## 2019-05-18 ENCOUNTER — Encounter: Payer: Self-pay | Admitting: Internal Medicine

## 2019-05-18 ENCOUNTER — Other Ambulatory Visit: Payer: Self-pay

## 2019-05-18 ENCOUNTER — Ambulatory Visit (INDEPENDENT_AMBULATORY_CARE_PROVIDER_SITE_OTHER): Payer: Medicare Other | Admitting: Internal Medicine

## 2019-05-18 VITALS — BP 132/84 | HR 97 | Ht 59.0 in | Wt 165.4 lb

## 2019-05-18 DIAGNOSIS — R300 Dysuria: Secondary | ICD-10-CM

## 2019-05-18 DIAGNOSIS — M19041 Primary osteoarthritis, right hand: Secondary | ICD-10-CM | POA: Diagnosis not present

## 2019-05-18 LAB — POCT URINALYSIS DIPSTICK
Bilirubin, UA: NEGATIVE
Blood, UA: NEGATIVE
Glucose, UA: NEGATIVE
Ketones, UA: NEGATIVE
Leukocytes, UA: NEGATIVE
Nitrite, UA: NEGATIVE
Protein, UA: NEGATIVE
Spec Grav, UA: 1.01 (ref 1.010–1.025)
Urobilinogen, UA: 0.2 E.U./dL
pH, UA: 6 (ref 5.0–8.0)

## 2019-05-18 NOTE — Patient Instructions (Addendum)
Take Tylenol 500 mg twice or three times a day as needed for hand pain  Use Voltaren gel topical over the counter - rub in 1-2 times per day to fingers and thumb  Your urine test was negative - no infection is seen.  Increase fluid intake to promote a dilute urine.

## 2019-05-18 NOTE — Progress Notes (Signed)
Date:  05/18/2019   Name:  Sharon York   DOB:  Dec 27, 1945   MRN:  [id number]   Chief Complaint: Hand Pain (Right hand pain. Patient stated that she feels the thumb is out of joint. )  Hand Pain  There was no injury mechanism. The pain is present in the right hand. The quality of the pain is described as aching. The pain does not radiate. The pain is mild. The pain has been fluctuating since the incident. Pertinent negatives include no chest pain, muscle weakness, numbness or tingling. The symptoms are aggravated by movement (she relies solely on her right hand). She has tried acetaminophen (but afraid to take tylenol due to possible liver issues) for the symptoms. The treatment provided mild relief.    Lab Results  Component Value Date   CREATININE 0.66 01/16/2019   BUN 14 01/16/2019   NA 141 01/16/2019   K 4.5 01/16/2019   CL 102 01/16/2019   CO2 25 01/16/2019   Lab Results  Component Value Date   CHOL 142 01/16/2019   HDL 63 01/16/2019   LDLCALC 65 01/16/2019   TRIG 72 01/16/2019   CHOLHDL 2.3 01/16/2019   Lab Results  Component Value Date   TSH 3.740 01/16/2019   Lab Results  Component Value Date   HGBA1C 7.1 (H) 01/16/2019     Review of Systems  Constitutional: Negative for chills, fatigue and fever.  Respiratory: Negative for cough, chest tightness, shortness of breath and wheezing.   Cardiovascular: Negative for chest pain.  Genitourinary: Negative for decreased urine volume, difficulty urinating, dysuria and pelvic pain.  Musculoskeletal: Positive for arthralgias and gait problem.  Neurological: Negative for dizziness, tingling, light-headedness, numbness and headaches.    Patient Active Problem List   Diagnosis Date Noted  . Type II diabetes mellitus with complication ([place]) 71/69/6789  . Right shoulder pain 03/31/2018  . Hyperlipidemia associated with type 2 diabetes mellitus ([place]) 06/12/2017  . Tinea corporis 03/17/2015  . Abnormal gait 12/15/2014   . Altered bowel function 12/15/2014  . Hemiparesis affecting left side as late effect of cerebrovascular accident (CVA) ([place]) 12/15/2014  . Obstructive sleep apnea of adult 12/15/2014  . Arthritis of shoulder region, degenerative 12/15/2014  . OP (osteoporosis) 12/15/2014  . Allergic rhinitis, seasonal 12/15/2014  . Mixed incontinence 03/11/2013    Allergies  Allergen Reactions  . Nitrofurantoin Nausea Only  . Iodinated Diagnostic Agents Hives and Cough    Patient developed a hive on her left lip after injection as well as some coughing.     Past Surgical History:  Procedure Laterality Date  . CESAREAN SECTION  1975  . COLONOSCOPY  2010   normal    Social History   Tobacco Use  . Smoking status: Never Smoker  . Smokeless tobacco: Never Used  . Tobacco comment: smoking cessation materials not required  Substance Use Topics  . Alcohol use: No    Alcohol/week: 0.0 standard drinks  . Drug use: Never     Medication list has been reviewed and updated.  Current Meds  Medication Sig  . ACCU-CHEK AVIVA PLUS test strip  USE 1 TEST STRIP UP TO 4 TIMES DAILY  . ACCU-CHEK FASTCLIX LANCETS MISC 1 each by Does not apply route 2 (two) times daily.  Marland Kitchen alendronate (FOSAMAX) 70 MG tablet TAKE 1 TABLET BY MOUTH ONCE A WEEK TAKE  WITH  A  FULL  GLASS  OF  WATER  ON  AN  EMPTY  STOMACH  .  aspirin 81 MG chewable tablet Chew 1 tablet by mouth daily.  Marland Kitchen atorvastatin (LIPITOR) 10 MG tablet Take 1 tablet (10 mg total) by mouth daily.  . baclofen (LIORESAL) 10 MG tablet Take 1 tablet (10 mg total) by mouth 2 (two) times daily.  . blood glucose meter kit and supplies KIT Dispense based on patient and insurance preference. Use up to 3 times daily as directed to check Blood Sugar for ICD10: E11.69 Uncontrolled DM2  . Calcium Carbonate-Vitamin D (CALCIUM 500 + D) 500-125 MG-UNIT TABS Take 1 tablet by mouth daily.   . metFORMIN (GLUCOPHAGE-XR) 500 MG 24 hr tablet TAKE 1 TABLET BY MOUTH WITH  BREAKFAST  . Multiple Vitamins-Minerals (MULTIVITAMIN ADULT PO) Take by mouth.  . NUTRITIONAL SUPP - DIET AIDS PO Take by mouth. Diabetamin- Lowers sugar and cravings.    PHQ 2/9 Scores 05/18/2019 03/11/2019 01/16/2019 01/16/2019  PHQ - 2 Score 0 0 0 0  PHQ- 9 Score - - - 0    BP Readings from Last 3 Encounters:  05/18/19 132/84  01/16/19 122/82  01/12/19 128/82    Physical Exam Cardiovascular:     Rate and Rhythm: Normal rate and regular rhythm.     Pulses: Normal pulses.  Pulmonary:     Effort: Pulmonary effort is normal.     Breath sounds: Normal breath sounds. No wheezing or rhonchi.  Abdominal:     Palpations: Abdomen is soft.     Tenderness: There is no abdominal tenderness.  Musculoskeletal:     Right hand: No swelling, deformity, tenderness or bony tenderness. Normal range of motion. Normal pulse.     Cervical back: Normal range of motion.     Comments: Right thumb appears to catch slightly with flexion  Neurological:     Mental Status: She is alert.  Psychiatric:        Attention and Perception: Attention and perception normal.     Wt Readings from Last 3 Encounters:  05/18/19 165 lb 6.4 oz (75 kg)  01/16/19 160 lb (72.6 kg)  01/12/19 163 lb (73.9 kg)    BP 132/84   Pulse 97   Ht '4\' 11"'  (1.499 m)   Wt 165 lb 6.4 oz (75 kg)   SpO2 95%   BMI 33.41 kg/m   Assessment and Plan: 1. Primary osteoarthritis of right hand Recommend Tylenol 500 mg 2-3 times per day Topical rubs with Voltaren gel twice a day Limit repetitive movements  If thumb catches/triggers will refer to Ortho  2. Dysuria UA is negative - POCT urinalysis dipstick   Partially dictated using Editor, commissioning. Any errors are unintentional.  Halina Maidens, MD [facility]  05/18/2019

## 2019-05-20 ENCOUNTER — Encounter: Payer: Self-pay | Admitting: Physical Therapy

## 2019-05-20 ENCOUNTER — Ambulatory Visit: Payer: Medicare Other | Admitting: Physical Therapy

## 2019-05-20 ENCOUNTER — Other Ambulatory Visit: Payer: Self-pay

## 2019-05-20 DIAGNOSIS — M6281 Muscle weakness (generalized): Secondary | ICD-10-CM | POA: Diagnosis not present

## 2019-05-20 DIAGNOSIS — R2689 Other abnormalities of gait and mobility: Secondary | ICD-10-CM

## 2019-05-20 DIAGNOSIS — R262 Difficulty in walking, not elsewhere classified: Secondary | ICD-10-CM

## 2019-05-20 NOTE — Therapy (Signed)
[place] [facility] [address] [place], [place], [address] Phone: [phone]   Fax:  [phone]  Physical Therapy Treatment  Patient Details  Name: Sharon York MRN: [id number] Date of Birth: [birth year] Referring Provider (PT): Freda Munro, MD   Encounter Date: 05/20/2019  PT End of Session - 05/20/19 0810    Visit Number  12    Number of Visits  17    Date for PT Re-Evaluation  06/10/19    PT Start Time  0800    PT Stop Time  0855    PT Time Calculation (min)  55 min    Equipment Utilized During Treatment  Gait belt    Activity Tolerance  Patient tolerated treatment well;Patient limited by fatigue    Behavior During Therapy  [facility] for tasks assessed/performed       Past Medical History:  Diagnosis Date  . Absence of bladder continence 12/15/2014  . Blood pressure elevated without history of HTN 12/15/2014  . Diabetes mellitus without complication ([place])   . Hyperlipidemia   . Osteoporosis   . Overactive bladder   . PMB (postmenopausal bleeding) 11/01/2017  . Stroke [facility])    hemiplegia on left side    Past Surgical History:  Procedure Laterality Date  . CESAREAN SECTION  1975  . COLONOSCOPY  2010   normal    There were no vitals filed for this visit.  Subjective Assessment - 05/20/19 0932    Subjective  Patient notes that she went to her MD on Monday and was given Voltaren (patient unsure) gel for her hand and Rx tylenol which has been helping. Patient reports that she notes even in walking short distances around the house she is noticing improved foot clearance without the AFO. She presents to clinic with AFO and decreased knee hyperextension thrust with gait outside and into clinic.    Currently in Pain?  No/denies      TREATMENT  Therapeutic Exercise:  Nu-Step, seat 6, L3 x15 min for improved L knee flexion recruitment via reciprocal motion, patient positioned to maintain L knee flexion to prevent hyperextension  thrust. Min VCs to prevent genu valgum and keep L heel contact  Neuromuscular Re-education:  Standing weight shifts, with mirror feedback for improved load acceptance and L knee control. RUE support. Mod Independent. x50, x20 Standing L 2" stair taps with no PT assist as needed 2x20, VCs required to prevent hip circumduction strategy, RUE support Standing R 2" stair taps with no PT assist to prevent L knee hyperextension x34, x50, VCs to maintain L knee in neutral RUE support Standing L 3" stair taps with no PT assist as needed 2x30, VCs required to prevent hip circumduction strategy RUE support Standing L 3" stair, end range hip flexion lift off, 2x20, VCs required to prevent hip circumduction strategy RUE support Gait in clinic with orthosis; VCs for L knee control to prevent hyperextension of L knee.   Patient educated throughout session on appropriate technique and form using multi-modal cueing, HEP, and activity modification. Patient articulated understanding and returned demonstration.  Patient Response to interventions: Patient reports feeling fatigued.  ASSESSMENT Patient presents to clinic with excellent motivation to participate in therapy. Patient demonstrates deficits in balance, posture, gait, LLE strength, and LUE ROM and spasticity/tone. Patient able to perform hip flexion lift-off of a 3" surface with moderate consistency during today's session and was able to control hyperextension thrust moments in LLE WB with mirror feedback and without for 50  consecutive repetitions. Patient will benefit from continued skilled therapeutic intervention to address remaining deficits in balance, posture, gait, LLE strength, and LUE ROM and spasticity/tone  in order to decrease risk of falls, increase function, and improve overall QOL.    PT Long Term Goals - 05/13/19 1014      PT LONG TERM GOAL #1   Title  Patient will be independent with HEP in order to improve strength and balance in order  to decrease fall risk and improve function at home and in the community.    Baseline  IE: provided; 03/17/2019: IND    Time  4    Period  Weeks    Status  Achieved      PT LONG TERM GOAL #2   Title  Patient will improve BERG by at least 3 points in order to demonstrate clinically significant improvement in balance.    Baseline  IE: 29/56; 10/27: 35/56; 11/24: 37/56; 12/23: 38/56    Time  4    Period  Weeks    Status  Partially Met    Target Date  06/10/19      PT LONG TERM GOAL #3   Title  Patient will improve ABC by at least 13% in order to demonstrate clinically significant improvement in balance confidence.    Baseline  IE: 45.63%; 03/17/2019: 27.18%; 11/24: 37.5%; 12/23: deferred 2/2 time    Time  4    Period  Weeks    Status  On-going    Target Date  06/10/19      PT LONG TERM GOAL #4   Title  Patient will decrease 5TSTS by at least 3 seconds with SUE support and with limited compensatory patterns in order to demonstrate clinically significant improvement in LE strength.    Baseline  IE: 15.4 sec, RUE support, pushing back against chair for TKE; 10/27: 16.6 sec, RUE, with theraband dorsiflexion assist/knee hyperextension block; 11/24: 19.8 sec, RUE, no brace, no compensatory leverage mechanism at posterior knee; 12/23: deferred 2/2 to time    Time  4    Period  Weeks    Status  Revised    Target Date  06/10/19      PT LONG TERM GOAL #5   Title  Patient will improve DGI by at least 3 points in order to demonstrate clinically significant improvement in balance and decreased risk for falls.    Baseline  IE: deferred 2/2 to time and fatigue (10/6: 11/19 falls risk); 10/27: 13/19; 11/24: 13/19    Time  4    Period  Weeks    Status  Deferred            Plan - 05/20/19 0810    Clinical Impression Statement  Patient presents to clinic with excellent motivation to participate in therapy. Patient demonstrates deficits in balance, posture, gait, LLE strength, and LUE ROM and  spasticity/tone. Patient able to perform hip flexion lift-off of a 3" surface with moderate consistency during today's session and was able to control hyperextension thrust moments in LLE WB with mirror feedback and without for 50 consecutive repetitions. Patient will benefit from continued skilled therapeutic intervention to address remaining deficits in balance, posture, gait, LLE strength, and LUE ROM and spasticity/tone  in order to decrease risk of falls, increase function, and improve overall QOL.    Personal Factors and Comorbidities  Age;Fitness;Behavior Pattern;Past/Current Experience;Comorbidity 3+;Time since onset of injury/illness/exacerbation;Finances    Comorbidities  DM, osteoporosis, HLD, overactive bladder    Examination-Activity Limitations  Bathing;Transfers;Reach Overhead;Squat;Stairs;Carry;Lift;Stand;Locomotion Level;Bend    Examination-Participation Restrictions  Church;Meal Prep;Cleaning;Community Activity;Laundry;Yard Work;Shop    Stability/Clinical Decision Making  Evolving/Moderate complexity    Rehab Potential  Fair    PT Frequency  1x / week    PT Duration  4 weeks    PT Treatment/Interventions  ADLs/Self Care Home Management;Aquatic Therapy;Cryotherapy;Moist Heat;Gait training;Stair training;Functional mobility training;Therapeutic activities;Therapeutic exercise;Balance training;Neuromuscular re-education;Patient/family education;Manual techniques;Passive range of motion;Energy conservation;Taping;Splinting;Orthotic Fit/Training;DME Instruction    PT Next Visit Plan  Gait with brace/orthosis    PT Home Exercise Plan  Seated LAQ, hip adduction squeezes, marches    Consulted and Agree with Plan of Care  Patient       Patient will benefit from skilled therapeutic intervention in order to improve the following deficits and impairments:  Abnormal gait, Decreased coordination, Decreased range of motion, Difficulty walking, Impaired tone, Impaired UE functional use, Increased  muscle spasms, Decreased endurance, Decreased activity tolerance, Improper body mechanics, Hypomobility, Impaired flexibility, Postural dysfunction, Impaired sensation, Decreased strength, Decreased mobility, Decreased balance  Visit Diagnosis: Muscle weakness (generalized)  Other abnormalities of gait and mobility  Difficulty in walking, not elsewhere classified     Problem List Patient Active Problem List   Diagnosis Date Noted  . Type II diabetes mellitus with complication ([place]) 14/48/1856  . Right shoulder pain 03/31/2018  . Hyperlipidemia associated with type 2 diabetes mellitus ([place]) 06/12/2017  . Tinea corporis 03/17/2015  . Abnormal gait 12/15/2014  . Altered bowel function 12/15/2014  . Hemiparesis affecting left side as late effect of cerebrovascular accident (CVA) ([place]) 12/15/2014  . Obstructive sleep apnea of adult 12/15/2014  . Arthritis of shoulder region, degenerative 12/15/2014  . OP (osteoporosis) 12/15/2014  . Allergic rhinitis, seasonal 12/15/2014  . Mixed incontinence 03/11/2013   Myles Gip PT, DPT [phone] 05/20/2019, 9:48 AM   [facility] [address] [place], [place], [address] Phone: [phone]   Fax:  [phone]  Name: Sharon York MRN: [id number] Date of Birth: 17-Jul-1945

## 2019-05-23 ENCOUNTER — Other Ambulatory Visit: Payer: Self-pay | Admitting: Internal Medicine

## 2019-05-23 DIAGNOSIS — E1169 Type 2 diabetes mellitus with other specified complication: Secondary | ICD-10-CM

## 2019-05-23 DIAGNOSIS — E785 Hyperlipidemia, unspecified: Secondary | ICD-10-CM

## 2019-05-27 ENCOUNTER — Ambulatory Visit: Payer: Medicare Other | Attending: Internal Medicine | Admitting: Physical Therapy

## 2019-05-27 ENCOUNTER — Encounter: Payer: Self-pay | Admitting: Physical Therapy

## 2019-05-27 ENCOUNTER — Other Ambulatory Visit: Payer: Self-pay

## 2019-05-27 DIAGNOSIS — M6281 Muscle weakness (generalized): Secondary | ICD-10-CM | POA: Diagnosis not present

## 2019-05-27 DIAGNOSIS — R262 Difficulty in walking, not elsewhere classified: Secondary | ICD-10-CM | POA: Insufficient documentation

## 2019-05-27 DIAGNOSIS — R2689 Other abnormalities of gait and mobility: Secondary | ICD-10-CM | POA: Diagnosis not present

## 2019-05-27 NOTE — Therapy (Signed)
[place] [facility] [address] [place], [place], [address] Phone: [phone]   Fax:  [phone]  Physical Therapy Treatment  Patient Details  Name: Sharon York MRN: [id number] Date of Birth: [birth year] Referring Provider (PT): Freda Munro, MD   Encounter Date: 05/27/2019  PT End of Session - 05/27/19 0914    Visit Number  13    Number of Visits  17    Date for PT Re-Evaluation  06/10/19    PT Start Time  0901    PT Stop Time  0955    PT Time Calculation (min)  54 min    Equipment Utilized During Treatment  Gait belt    Activity Tolerance  Patient tolerated treatment well    Behavior During Therapy  [facility] for tasks assessed/performed       Past Medical History:  Diagnosis Date  . Absence of bladder continence 12/15/2014  . Blood pressure elevated without history of HTN 12/15/2014  . Diabetes mellitus without complication ([place])   . Hyperlipidemia   . Osteoporosis   . Overactive bladder   . PMB (postmenopausal bleeding) 11/01/2017  . Stroke [facility])    hemiplegia on left side    Past Surgical History:  Procedure Laterality Date  . CESAREAN SECTION  1975  . COLONOSCOPY  2010   normal    There were no vitals filed for this visit.  Subjective Assessment - 05/27/19 0912    Subjective  Patient reports that her hand is feeling much better. She notes that she feels she is walking much better, but still has some fear of falling with standing weight shifts at home. Patient ambulating into clinic with AFO and quad cane today with much better stability and grossly without any hyperestension moment at L knee.    Currently in Pain?  No/denies       TREATMENT  Therapeutic Exercise:  Nu-Step, seat 6, L4 x10 min for improved L knee flexion recruitment via reciprocal motion, patient positioned to maintain L knee flexion to prevent hyperextension thrust. Min VCs to prevent genu valgum and keep L heel contact  Neuromuscular Re-education:   Standing weight shifts, with mirror feedback for improved load acceptance and L knee control. RUE support. Mod Independent. x50, x20 Standing L 2" stair taps with no PT assist as needed 2x20, VCs required to prevent hip circumduction strategy, RUE support Standing R 2" stair taps with no PT assist to prevent L knee hyperextension x34, x50, VCs to maintain L knee in neutral RUE support Standing L 3" stair taps with no PT assist as needed 2x30, VCs required to prevent hip circumduction strategy RUE support Standing L 3" stair, end range hip flexion lift off, 2x20, VCs required to prevent hip circumduction strategy RUE support Gait in clinic with orthosis; minimal VCs for L knee control to prevent hyperextension of L knee.   Patient educated throughout session on appropriate technique and form using multi-modal cueing, HEP, and activity modification. Patient articulated understanding and returned demonstration.  Patient Response to interventions: Patient reports feeling fatigued.  ASSESSMENT Patient presents to clinic with excellent motivation to participate in therapy. Patient demonstrates deficits in balance, posture, gait, LLE strength, and LUE ROM and spasticity/tone. Patient able to perform hip flexion lift-off of a 3" surface with improved consistency and decreased spinal compensations during today's session and was able to control hyperextension thrust moments in LLE WB with mirror feedback and without for >50 consecutive repetitions. Patient will benefit from continued skilled  therapeutic intervention to address remaining deficits in balance, posture, gait, LLE strength, and LUE ROM and spasticity/tone  in order to decrease risk of falls, increase function, and improve overall QOL.    PT Long Term Goals - 05/13/19 1014      PT LONG TERM GOAL #1   Title  Patient will be independent with HEP in order to improve strength and balance in order to decrease fall risk and improve function at home  and in the community.    Baseline  IE: provided; 03/17/2019: IND    Time  4    Period  Weeks    Status  Achieved      PT LONG TERM GOAL #2   Title  Patient will improve BERG by at least 3 points in order to demonstrate clinically significant improvement in balance.    Baseline  IE: 29/56; 10/27: 35/56; 11/24: 37/56; 12/23: 38/56    Time  4    Period  Weeks    Status  Partially Met    Target Date  06/10/19      PT LONG TERM GOAL #3   Title  Patient will improve ABC by at least 13% in order to demonstrate clinically significant improvement in balance confidence.    Baseline  IE: 45.63%; 03/17/2019: 27.18%; 11/24: 37.5%; 12/23: deferred 2/2 time    Time  4    Period  Weeks    Status  On-going    Target Date  06/10/19      PT LONG TERM GOAL #4   Title  Patient will decrease 5TSTS by at least 3 seconds with SUE support and with limited compensatory patterns in order to demonstrate clinically significant improvement in LE strength.    Baseline  IE: 15.4 sec, RUE support, pushing back against chair for TKE; 10/27: 16.6 sec, RUE, with theraband dorsiflexion assist/knee hyperextension block; 11/24: 19.8 sec, RUE, no brace, no compensatory leverage mechanism at posterior knee; 12/23: deferred 2/2 to time    Time  4    Period  Weeks    Status  Revised    Target Date  06/10/19      PT LONG TERM GOAL #5   Title  Patient will improve DGI by at least 3 points in order to demonstrate clinically significant improvement in balance and decreased risk for falls.    Baseline  IE: deferred 2/2 to time and fatigue (10/6: 11/19 falls risk); 10/27: 13/19; 11/24: 13/19    Time  4    Period  Weeks    Status  Deferred            Plan - 05/27/19 0915    Clinical Impression Statement  Patient presents to clinic with excellent motivation to participate in therapy. Patient demonstrates deficits in balance, posture, gait, LLE strength, and LUE ROM and spasticity/tone. Patient able to perform hip flexion  lift-off of a 3" surface with improved consistency and decreased spinal compensations during today's session and was able to control hyperextension thrust moments in LLE WB with mirror feedback and without for >50 consecutive repetitions. Patient will benefit from continued skilled therapeutic intervention to address remaining deficits in balance, posture, gait, LLE strength, and LUE ROM and spasticity/tone  in order to decrease risk of falls, increase function, and improve overall QOL.    Personal Factors and Comorbidities  Age;Fitness;Behavior Pattern;Past/Current Experience;Comorbidity 3+;Time since onset of injury/illness/exacerbation;Finances    Comorbidities  DM, osteoporosis, HLD, overactive bladder    Examination-Activity Limitations  Bathing;Transfers;Reach Overhead;Squat;Stairs;Carry;Lift;Stand;Locomotion Level;[employer]  Examination-Participation Restrictions  Church;Meal Prep;Cleaning;Community Activity;Laundry;Yard Work;Shop    Stability/Clinical Decision Making  Evolving/Moderate complexity    Rehab Potential  Fair    PT Frequency  1x / week    PT Duration  4 weeks    PT Treatment/Interventions  ADLs/Self Care Home Management;Aquatic Therapy;Cryotherapy;Moist Heat;Gait training;Stair training;Functional mobility training;Therapeutic activities;Therapeutic exercise;Balance training;Neuromuscular re-education;Patient/family education;Manual techniques;Passive range of motion;Energy conservation;Taping;Splinting;Orthotic Fit/Training;DME Instruction    PT Next Visit Plan  Gait with brace/orthosis    PT Home Exercise Plan  Seated LAQ, hip adduction squeezes, marches    Consulted and Agree with Plan of Care  Patient       Patient will benefit from skilled therapeutic intervention in order to improve the following deficits and impairments:  Abnormal gait, Decreased coordination, Decreased range of motion, Difficulty walking, Impaired tone, Impaired UE functional use, Increased muscle spasms,  Decreased endurance, Decreased activity tolerance, Improper body mechanics, Hypomobility, Impaired flexibility, Postural dysfunction, Impaired sensation, Decreased strength, Decreased mobility, Decreased balance  Visit Diagnosis: Muscle weakness (generalized)  Other abnormalities of gait and mobility  Difficulty in walking, not elsewhere classified     Problem List Patient Active Problem List   Diagnosis Date Noted  . Type II diabetes mellitus with complication ([place]) 19/47/1252  . Right shoulder pain 03/31/2018  . Hyperlipidemia associated with type 2 diabetes mellitus ([place]) 06/12/2017  . Tinea corporis 03/17/2015  . Abnormal gait 12/15/2014  . Altered bowel function 12/15/2014  . Hemiparesis affecting left side as late effect of cerebrovascular accident (CVA) ([place]) 12/15/2014  . Obstructive sleep apnea of adult 12/15/2014  . Arthritis of shoulder region, degenerative 12/15/2014  . OP (osteoporosis) 12/15/2014  . Allergic rhinitis, seasonal 12/15/2014  . Mixed incontinence 03/11/2013   Myles Gip PT, DPT [phone] 05/27/2019, 12:45 PM  [place] [facility] [address] [place], [place], [address] Phone: [phone]   Fax:  [phone]  Name: LAINE FONNER MRN: [id number] Date of Birth: 1946/01/29

## 2019-06-02 ENCOUNTER — Other Ambulatory Visit: Payer: Self-pay | Admitting: *Deleted

## 2019-06-03 ENCOUNTER — Encounter: Payer: Self-pay | Admitting: Physical Therapy

## 2019-06-03 ENCOUNTER — Other Ambulatory Visit: Payer: Self-pay

## 2019-06-03 ENCOUNTER — Ambulatory Visit: Payer: Medicare Other | Admitting: Physical Therapy

## 2019-06-03 DIAGNOSIS — R2689 Other abnormalities of gait and mobility: Secondary | ICD-10-CM | POA: Diagnosis not present

## 2019-06-03 DIAGNOSIS — R262 Difficulty in walking, not elsewhere classified: Secondary | ICD-10-CM | POA: Diagnosis not present

## 2019-06-03 DIAGNOSIS — M6281 Muscle weakness (generalized): Secondary | ICD-10-CM

## 2019-06-03 NOTE — Patient Outreach (Signed)
Sharon York Sharon York [facility]) Care Management  [place]  06/03/2019   Sharon York [birth year] [phone]  RN Health Coach telephone call to patient.  [employer] compliance verified. Per patient she is doing good. Her FBS was 163 and at noon 92. Patient is wearing leg brace that is helping  her to be self sufficient. Patient is currently taking rehab. Patient has received COVID vaccine. Patient has agreed to further outreach calls.  Encounter Medications:  Outpatient Encounter Medications as of 06/02/2019  Medication Sig Note  . ACCU-CHEK AVIVA PLUS test strip  USE 1 TEST STRIP UP TO 4 TIMES DAILY   . ACCU-CHEK FASTCLIX LANCETS MISC 1 each by Does not apply route 2 (two) times daily.   Marland Kitchen alendronate (FOSAMAX) 70 MG tablet TAKE 1 TABLET BY MOUTH ONCE A WEEK TAKE  WITH  A  FULL  GLASS  OF  WATER  ON  AN  EMPTY  STOMACH   . aspirin 81 MG chewable tablet Chew 1 tablet by mouth daily. 12/15/2014: Received from: [place]: Chew by mouth.  Marland Kitchen atorvastatin (LIPITOR) 10 MG tablet Take 1 tablet by mouth once daily   . baclofen (LIORESAL) 10 MG tablet Take 1 tablet (10 mg total) by mouth 2 (two) times daily.   . blood glucose meter kit and supplies KIT Dispense based on patient and insurance preference. Use up to 3 times daily as directed to check Blood Sugar for ICD10: E11.69 Uncontrolled DM2   . Calcium Carbonate-Vitamin D (CALCIUM 500 + D) 500-125 MG-UNIT TABS Take 1 tablet by mouth daily.  12/17/2014: Received from: [facility]  . metFORMIN (GLUCOPHAGE-XR) 500 MG 24 hr tablet TAKE 1 TABLET BY MOUTH WITH BREAKFAST   . Multiple Vitamins-Minerals (MULTIVITAMIN ADULT PO) Take by mouth.   . NUTRITIONAL SUPP - DIET AIDS PO Take by mouth. Diabetamin- Lowers sugar and cravings.    No facility-administered encounter medications on file as of 06/02/2019.    Functional Status:  In your present state of health, do you have any difficulty performing the following  activities: 03/11/2019 01/12/2019  Hearing? N Y  Comment slight hoh has hearing aids  Vision? N N  Comment - wears glasses  Difficulty concentrating or making decisions? N N  Walking or climbing stairs? Y Y  Comment uses cane or walker/ results from cva -  Dressing or bathing? N N  Doing errands, shopping? Y N  Comment daughter does errands -  Conservation officer, nature and eating ? Y N  Comment reeceives meals on wheels -  Using the Toilet? N N  In the past six months, have you accidently leaked urine? Y Y  Do you have problems with loss of bowel control? N N  Managing your Medications? N N  Managing your Finances? Aggie Moats  Comment daughter assists -  Housekeeping or managing your Housekeeping? Y N  Comment daughter assists -  Some recent data might be hidden    Fall/Depression Screening: Fall Risk  06/03/2019 03/11/2019 01/16/2019  Falls in the past year? 0 0 0  Comment - - -  Number falls in past yr: 0 0 0  Injury with Fall? 0 0 0  Risk Factor Category  - - -  Risk for fall due to : History of fall(s);Impaired balance/gait;Impaired mobility Impaired balance/gait;Impaired mobility Impaired balance/gait;Impaired mobility  Risk for fall due to: Comment - - -  Follow up Falls evaluation completed;Falls prevention discussed Falls evaluation completed;Falls prevention discussed Falls evaluation completed;Falls  prevention discussed;Education provided   [facility] 2/9 Scores 05/18/2019 03/11/2019 01/16/2019 01/16/2019 01/12/2019 10/29/2018 09/19/2018  PHQ - 2 Score 0 0 0 0 0 0 0  PHQ- 9 Score - - - 0 2 - -   THN CM Care Plan Problem One     Most Recent Value  Care Plan Problem One  Knowledge Deficit in Self Management of Diabetes  Role Documenting the Problem One  [place] for Problem One  Active  THN Long Term Goal   Patient will see a decrease in A1C 7.1 within the next 90 days  THN Long Term Goal Start Date  06/03/19  Interventions for Problem One Long Term Goal  RN discussed fasting blood  sugar and A1c, patient will follow with future blood draw, RN will follow up with monitoring      Assessment:  FBS 163 Patient has received COVID vaccine Patient is in physical therapy Patient is checking blood sugars and documenting as per doctor order Patient will benefit from [place] telephonic outreach for education and support for diabetes self management.  Plan:  Send 2021 calendar book RN discussed FBS and A1c RN will follow up within the month of April  Sharon York [place] Care Management [phone]

## 2019-06-03 NOTE — Therapy (Signed)
[facility] [address] [place], [place], [address] Phone: [phone]   Fax:  [phone]  Physical Therapy Treatment  Patient Details  Name: Sharon York MRN: [id number] Date of Birth: June 05, 1945 Referring Provider (PT): Namon Cirri, MD   Encounter Date: 06/03/2019  PT End of Session - 06/03/19 1025    Visit Number  14    Number of Visits  17    Date for PT Re-Evaluation  06/10/19    PT Start Time  0900    PT Stop Time  0955    PT Time Calculation (min)  55 min    Equipment Utilized During Treatment  Gait belt    Activity Tolerance  Patient tolerated treatment well    Behavior During Therapy  [facility] for tasks assessed/performed       Past Medical History:  Diagnosis Date  . Absence of bladder continence 12/15/2014  . Blood pressure elevated without history of HTN 12/15/2014  . Diabetes mellitus without complication (HCC)   . Hyperlipidemia   . Osteoporosis   . Overactive bladder   . PMB (postmenopausal bleeding) 11/01/2017  . Stroke [facility])    hemiplegia on left side    Past Surgical History:  Procedure Laterality Date  . CESAREAN SECTION  1975  . COLONOSCOPY  2010   normal    There were no vitals filed for this visit.  Subjective Assessment - 06/03/19 1028    Subjective  Patient reports that there have been no new/significant changes. She notes that she has been able to practice the weight shifts with increased frequency at home and feels they are helping. She reports high confidence with her ability to maintain her improvements independently and is ready for d/c.    Currently in Pain?  No/denies       TREATMENT  Therapeutic Exercise:  Nu-Step, seat 6, L4 x10 min for improved L knee flexion recruitment via reciprocal motion, patient positioned to maintain L knee flexion to prevent hyperextension thrust. Min VCs to prevent genu valgum and keep L heel contact STS, RUE support, x5 without compensations (patient able  to stand without support for one rep)  Neuromuscular Re-education:  Standing weight shifts, with mirror feedback for improved load acceptance and L knee control. RUE support. Mod Independent. x50, x20 Standing L 2" stair taps with no PT assist as needed 2x20, no VCs required to prevent hip circumduction strategy, RUE support Standing R 2" stair taps with no PT assist to prevent L knee hyperextension x34, x50, no VCs to maintain L knee in neutral RUE support Standing L 3" stair taps with no PT assist as needed 2x30, no VCs required to prevent hip circumduction strategy RUE support Standing L 3" stair, end range hip flexion lift off, 2x20, no VCs required to prevent hip circumduction strategy RUE support Gait in clinic with orthosis; no VCs for L knee control to prevent hyperextension of L knee. 1-2 incidents over 80 feet   Patient educated throughout session on appropriate technique and form using multi-modal cueing, HEP, and activity modification. Patient articulated understanding and returned demonstration.  Patient Response to interventions: Patient reports confidence with self-management.  ASSESSMENT Patient presents to clinic with excellent motivation to participate in therapy. Patient demonstrates deficits in balance, posture, gait, LLE strength, and LUE ROM and spasticity/tone. Patient able to perform hip flexion lift-off of a 3" surface absent of significant hip circumduction and lateral spinal flexion during today's session and continued to be able to  control hyperextension thrust moments in LLE WB for >50 consecutive repetitions. At this time, based on patient's progress and function, she is appropriate to d/c to self-management/ maintenance of improvements for the following: balance, posture, gait, LLE strength in order to decrease risk of falls, increase function, and improve overall QOL.    PT Long Term Goals - 06/03/19 0919      PT LONG TERM GOAL #1   Title  Patient will be  independent with HEP in order to improve strength and balance in order to decrease fall risk and improve function at home and in the community.    Baseline  IE: provided; 03/17/2019: IND    Time  4    Period  Weeks    Status  Achieved      PT LONG TERM GOAL #2   Title  Patient will improve BERG by at least 3 points in order to demonstrate clinically significant improvement in balance.    Baseline  IE: 29/56; 10/27: 35/56; 11/24: 37/56; 12/23: 38/56    Time  4    Period  Weeks    Status  Deferred      PT LONG TERM GOAL #3   Title  Patient will improve ABC by at least 13% in order to demonstrate clinically significant improvement in balance confidence.    Baseline  IE: 45.63%; 03/17/2019: 27.18%; 11/24: 37.5%; 12/23: deferred 2/2 time    Time  4    Period  Weeks    Status  Deferred      PT LONG TERM GOAL #4   Title  Patient will decrease 5TSTS by at least 3 seconds with SUE support and with limited compensatory patterns in order to demonstrate clinically significant improvement in LE strength.    Baseline  IE: 15.4 sec, RUE support, pushing back against chair for TKE; 10/27: 16.6 sec, RUE, with theraband dorsiflexion assist/knee hyperextension block; 11/24: 19.8 sec, RUE, no brace, no compensatory leverage mechanism at posterior knee; 12/23: deferred 2/2 to time; 1/13: 21.34, with brace, no posterior knee leverage component, able to stand without holding on for one rep    Time  4    Period  Weeks    Status  Deferred      PT LONG TERM GOAL #5   Title  Patient will improve DGI by at least 3 points in order to demonstrate clinically significant improvement in balance and decreased risk for falls.    Baseline  IE: deferred 2/2 to time and fatigue (10/6: 11/19 falls risk); 10/27: 13/19; 11/24: 13/19    Time  4    Period  Weeks    Status  Deferred            Plan - 06/03/19 1025    Clinical Impression Statement  Patient presents to clinic with excellent motivation to participate in  therapy. Patient demonstrates deficits in balance, posture, gait, LLE strength, and LUE ROM and spasticity/tone. Patient able to perform hip flexion lift-off of a 3" surface absent of significant hip circumduction and lateral spinal flexion during today's session and continued to be able to control hyperextension thrust moments in LLE WB for >50 consecutive repetitions. At this time, based on patient's progress and function, she is appropriate to d/c to self-management/ maintenance of improvements for the following: balance, posture, gait, LLE strength in order to decrease risk of falls, increase function, and improve overall QOL.    Personal Factors and Comorbidities  Age;Fitness;Behavior Pattern;Past/Current Experience;Comorbidity 3+;Time since onset of injury/illness/exacerbation;Finances  Comorbidities  DM, osteoporosis, HLD, overactive bladder    Examination-Activity Limitations  Bathing;Transfers;Reach Overhead;Squat;Stairs;Carry;Lift;Stand;Locomotion Level;Bend    Examination-Participation Restrictions  Church;Meal Prep;Cleaning;Community Activity;Laundry;Yard Work;Shop    Stability/Clinical Decision Making  Evolving/Moderate complexity    Rehab Potential  Fair    PT Frequency  1x / week    PT Duration  4 weeks    PT Treatment/Interventions  ADLs/Self Care Home Management;Aquatic Therapy;Cryotherapy;Moist Heat;Gait training;Stair training;Functional mobility training;Therapeutic activities;Therapeutic exercise;Balance training;Neuromuscular re-education;Patient/family education;Manual techniques;Passive range of motion;Energy conservation;Taping;Splinting;Orthotic Fit/Training;DME Instruction    PT Next Visit Plan  Gait with brace/orthosis    PT Home Exercise Plan  Seated LAQ, hip adduction squeezes, marches    Consulted and Agree with Plan of Care  Patient       Patient will benefit from skilled therapeutic intervention in order to improve the following deficits and impairments:  Abnormal  gait, Decreased coordination, Decreased range of motion, Difficulty walking, Impaired tone, Impaired UE functional use, Increased muscle spasms, Decreased endurance, Decreased activity tolerance, Improper body mechanics, Hypomobility, Impaired flexibility, Postural dysfunction, Impaired sensation, Decreased strength, Decreased mobility, Decreased balance  Visit Diagnosis: Muscle weakness (generalized)  Other abnormalities of gait and mobility  Difficulty in walking, not elsewhere classified     Problem List Patient Active Problem List   Diagnosis Date Noted  . Type II diabetes mellitus with complication (HCC) 04/21/2018  . Right shoulder pain 03/31/2018  . Hyperlipidemia associated with type 2 diabetes mellitus (HCC) 06/12/2017  . Tinea corporis 03/17/2015  . Abnormal gait 12/15/2014  . Altered bowel function 12/15/2014  . Hemiparesis affecting left side as late effect of cerebrovascular accident (CVA) (HCC) 12/15/2014  . Obstructive sleep apnea of adult 12/15/2014  . Arthritis of shoulder region, degenerative 12/15/2014  . OP (osteoporosis) 12/15/2014  . Allergic rhinitis, seasonal 12/15/2014  . Mixed incontinence 03/11/2013   Sheria Lang PT, DPT [phone] 06/03/2019, 10:40 AM   [facility] [address] [place], [place], [address] Phone: [phone]   Fax:  [phone]  Name: RENAY CRAMMER MRN: [id number] Date of Birth: 05/11/1946

## 2019-06-10 ENCOUNTER — Encounter: Payer: Self-pay | Admitting: Physical Therapy

## 2019-06-17 ENCOUNTER — Encounter: Payer: Self-pay | Admitting: Physical Therapy

## 2019-06-29 ENCOUNTER — Telehealth: Payer: Self-pay | Admitting: Internal Medicine

## 2019-06-29 ENCOUNTER — Other Ambulatory Visit: Payer: Self-pay

## 2019-06-29 ENCOUNTER — Ambulatory Visit
Admission: EM | Admit: 2019-06-29 | Discharge: 2019-06-29 | Disposition: A | Discharge status: Home / Self Care | Payer: Medicare Other | Attending: Emergency Medicine | Admitting: Emergency Medicine

## 2019-06-29 ENCOUNTER — Ambulatory Visit (INDEPENDENT_AMBULATORY_CARE_PROVIDER_SITE_OTHER): Payer: Medicare Other

## 2019-06-29 ENCOUNTER — Ambulatory Visit (INDEPENDENT_AMBULATORY_CARE_PROVIDER_SITE_OTHER)
Admit: 2019-06-29 | Discharge: 2019-06-29 | Disposition: A | Discharge status: Home / Self Care | Payer: Medicare Other | Attending: Emergency Medicine | Admitting: Emergency Medicine

## 2019-06-29 DIAGNOSIS — S0990XA Unspecified injury of head, initial encounter: Secondary | ICD-10-CM | POA: Diagnosis not present

## 2019-06-29 DIAGNOSIS — S40012A Contusion of left shoulder, initial encounter: Secondary | ICD-10-CM | POA: Diagnosis not present

## 2019-06-29 DIAGNOSIS — W19XXXA Unspecified fall, initial encounter: Secondary | ICD-10-CM

## 2019-06-29 DIAGNOSIS — M25512 Pain in left shoulder: Secondary | ICD-10-CM | POA: Diagnosis not present

## 2019-06-29 DIAGNOSIS — R519 Headache, unspecified: Secondary | ICD-10-CM | POA: Diagnosis not present

## 2019-06-29 DIAGNOSIS — S4992XA Unspecified injury of left shoulder and upper arm, initial encounter: Secondary | ICD-10-CM | POA: Diagnosis not present

## 2019-06-29 NOTE — ED Triage Notes (Signed)
Patient states that she fell outside of her home on Saturday. States that she landed on her left shoulder and hit her head when she tried to roll over to get up. Reports that she had some nausea on Saturday evening but that has resolved. Reports that she is still having a headache at times. Patient states that shoulder is slightly sore.

## 2019-06-29 NOTE — Telephone Encounter (Signed)
Reviewed and yes patient needs to be sure she does not have a concussion and seek care at Er or UC.  Thank you!

## 2019-06-29 NOTE — Discharge Instructions (Addendum)
Your x-ray and head CT were negative for any acute changes.  You have not broken anything.  There is no bruising or bleeding in your brain.  You may take 502,000 mg of Tylenol 3-4 times a day as needed for pain.  Follow-up with your doctor in several days if not getting any better.  Go immediately to the ER for strokelike symptoms, worsening of your headache, or for other concerns.

## 2019-06-29 NOTE — ED Provider Notes (Signed)
HPI  SUBJECTIVE:  Sharon York is a 74 y.o. female who presents with a fall 2 days ago.  Patient states that she lost her balance and fell onto the concrete landing on her left side.  Denies preceding chest pain, shortness of breath, palpitations, syncope causing fall.  Denies loss of consciousness.  States that she hit the left side back of her head on the cement.  She reports nausea that night which has since resolved.  She reports persistent headache.   No vomiting, visual changes, photophobia.  No new arm or leg weakness.  No facial droop, slurred speech.  No chest pain or shortness of breath, abdominal pain, neck pain. Also reports mild intermittent posterior shoulder pain described as dull.  It lasts seconds to minutes.  No aggravating or alleviating factors.  She has not tried anything for her symptoms. No other extremity pain.  Past medical history of diabetes, osteoporosis, stroke with resulting left-sided arm paralysis.  No history of MI, syncope.  She is on 81 mg of aspirin daily.  PMD: .[name], MD  Past Medical History:  Diagnosis Date  . Absence of bladder continence 12/15/2014  . Blood pressure elevated without history of HTN 12/15/2014  . Diabetes mellitus without complication ([place])   . Hyperlipidemia   . Osteoporosis   . Overactive bladder   . PMB (postmenopausal bleeding) 11/01/2017  . Stroke [facility])    hemiplegia on left side    Past Surgical History:  Procedure Laterality Date  . CESAREAN SECTION  1975  . COLONOSCOPY  2010   normal    Family History  Problem Relation Age of Onset  . Diabetes Mother   . Thyroid disease Mother   . Breast cancer Maternal Aunt   . Breast cancer Cousin        mat cousin  . Prostate cancer Brother   . Kidney failure Brother   . Kidney cancer Son   . Bladder Cancer Neg Hx     Social History   Tobacco Use  . Smoking status: Never Smoker  . Smokeless tobacco: Never Used  . Tobacco comment: smoking cessation materials  not required  Substance Use Topics  . Alcohol use: No    Alcohol/week: 0.0 standard drinks  . Drug use: Never    No current facility-administered medications for this encounter.  Current Outpatient Medications:  .  ACCU-CHEK AVIVA PLUS test strip,  USE 1 TEST STRIP UP TO 4 TIMES DAILY, Disp: 400 each, Rfl: 3 .  ACCU-CHEK FASTCLIX LANCETS MISC, 1 each by Does not apply route 2 (two) times daily., Disp: 100 each, Rfl: 3 .  alendronate (FOSAMAX) 70 MG tablet, TAKE 1 TABLET BY MOUTH ONCE A WEEK TAKE  WITH  A  FULL  GLASS  OF  WATER  ON  AN  EMPTY  STOMACH, Disp: 12 tablet, Rfl: 3 .  aspirin 81 MG chewable tablet, Chew 1 tablet by mouth daily., Disp: , Rfl:  .  atorvastatin (LIPITOR) 10 MG tablet, Take 1 tablet by mouth once daily, Disp: 90 tablet, Rfl: 3 .  baclofen (LIORESAL) 10 MG tablet, Take 1 tablet (10 mg total) by mouth 2 (two) times daily., Disp: 60 tablet, Rfl: 12 .  blood glucose meter kit and supplies KIT, Dispense based on patient and insurance preference. Use up to 3 times daily as directed to check Blood Sugar for ICD10: E11.69 Uncontrolled DM2, Disp: 1 each, Rfl: 0 .  Calcium Carbonate-Vitamin D (CALCIUM 500 + D) 500-125  MG-UNIT TABS, Take 1 tablet by mouth daily. , Disp: , Rfl:  .  metFORMIN (GLUCOPHAGE-XR) 500 MG 24 hr tablet, TAKE 1 TABLET BY MOUTH WITH BREAKFAST, Disp: 90 tablet, Rfl: 3 .  Multiple Vitamins-Minerals (MULTIVITAMIN ADULT PO), Take by mouth., Disp: , Rfl:  .  NUTRITIONAL SUPP - DIET AIDS PO, Take by mouth. Diabetamin- Lowers sugar and cravings., Disp: , Rfl:   Allergies  Allergen Reactions  . Nitrofurantoin Nausea Only  . Iodinated Diagnostic Agents Hives and Cough    Patient developed a hive on her left lip after injection as well as some coughing.      ROS  As noted in HPI.   Physical Exam  BP 135/85 (BP Location: Left Arm)   Pulse 85   Temp 98.3 F (36.8 C) (Oral)   Resp 16   Ht _0  (1.448 m)   Wt 69.9 kg   SpO2 96%   BMI 33.33 kg/m    Constitutional: Well developed, well nourished, no acute distress Eyes: PERRLA EOMI, conjunctiva normal bilaterally no direct or consensual photophobia HENT: Normocephalic, atraumatic,mucus membranes moist.  Positive tenderness posterior left skull, no soft tissue swelling, palpable skull fracture.  No hemotympanum.  No facial tenderness.  No periorbital ecchymosis. Back: No C-spine T-spine or L-spine tenderness.  No trapezial tenderness bilaterally. Respiratory: Normal inspiratory effort, lungs clear bilaterally.  No left-sided rib tenderness anteriorally posteriorly Cardiovascular: Normal rate regular rhythm no murmurs rubs or gallops GI: nondistended skin: No rash, skin intact Musculoskeletal: no deformities.  Unable to move L shoulder, clavicle NT, A/C joint NT, scapula tender, proximal humerus NT, shoulder joint NT , Motor strength absent,  Sensation intact to temp over deltoid region, distal NVI with hand on affected side having intact sensation in the distribution of the median, radial, and ulnar nerve.  . Neurologic: Alert & oriented x 3, cranial nerves III through XII intact.  Ambulatory with a cane.  GCS 15.  Left arm paralysis.  Psychiatric: Speech and behavior appropriate   ED Course   Medications - No data to display  Orders Placed This Encounter  Procedures  . DG Scapula Left    Standing Status:   Standing    Number of Occurrences:   1    Order Specific Question:   Reason for Exam (SYMPTOM  OR DIAGNOSIS REQUIRED)    Answer:   fall r/o fx  . CT Head Wo Contrast    Standing Status:   Standing    Number of Occurrences:   1    No results found for this or any previous visit (from the past 24 hour(s)). DG Scapula Left  Result Date: 06/29/2019 CLINICAL DATA:  Status post fall 2 days ago.  Left shoulder pain EXAM: LEFT SCAPULA - 2+ VIEWS COMPARISON:  None. FINDINGS: Generalized osteopenia. No acute fracture or dislocation. Old healed left humeral neck fracture. Mild  arthropathy of the acromioclavicular joint. IMPRESSION: No acute osseous injury of the left shoulder. Electronically Signed   By: Kathreen Devoid   On: 06/29/2019 10:28   CT Head Wo Contrast  Result Date: 06/29/2019 CLINICAL DATA:  Headache since fall 2 days ago. EXAM: CT HEAD WITHOUT CONTRAST TECHNIQUE: Contiguous axial images were obtained from the base of the skull through the vertex without intravenous contrast. COMPARISON:  CT head dated February 03, 2015. FINDINGS: Brain: No evidence of acute infarction, hemorrhage, hydrocephalus, extra-axial collection or mass lesion/mass effect. Old large right MCA territory infarct again noted. Vascular: Atherosclerotic vascular calcification of the  carotid siphons. No hyperdense vessel. Skull: Negative for fracture or focal lesion. Sinuses/Orbits: No acute finding. Other: None. IMPRESSION: 1. No acute intracranial abnormality. 2. 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 Electronically Signed   By: Titus Dubin M.D.   On: 06/29/2019 10:38    ED Clinical Impression  1. Fall, initial encounter   2. Minor head injury, initial encounter   3. Contusion of left scapula, initial encounter      ED Assessment/Plan  Outside records reviewed.  Patient contacted PMD this morning for nausea after falling and hitting her head 2 days ago and was advised to come here for evaluation.  Will get noncontrast CT head and scapular x-ray.  Doubt shoulder fracture she has no other shoulder tenderness.  The left arm paralysis is not new.  No evidence of skull fracture, C, T, L-spine fracture.  No rib tenderness suggesting rib fracture.  Patient has no other complaints.  Reviewed scapular x-ray independently.  No fractures.  Head CT images not loading.  Radiology report no fractures, bleed, acute changes.  Old large right MCA infarct see radiology report for full details.  Patient status post fall with minor head injury and upper back/scapular contusion.  Imaging negative for  acute pathology.  We will have her follow-up with her doctor as needed, to the ER if she gets worse.  Discussed  imaging, MDM, treatment plan, and plan for follow-up with patient. Discussed sn/sx that should prompt return to the ED. patient agrees with plan.   No orders of the defined types were placed in this encounter.   *This clinic note was created using Dragon dictation software. Therefore, there may be occasional mistakes despite careful proofreading.   ?    Melynda Ripple, MD 06/29/19 1327

## 2019-06-29 NOTE — Telephone Encounter (Signed)
Ms Topper called about an appointment to be seen for a fall that occurred this past Saturday and that she had hit her head. Patient is experiencing symptoms of feeling sick on that stomach. I advised patient to be seen in the UC to be evaluated.

## 2019-07-20 ENCOUNTER — Other Ambulatory Visit: Payer: Self-pay

## 2019-07-20 ENCOUNTER — Ambulatory Visit
Admission: EM | Admit: 2019-07-20 | Discharge: 2019-07-20 | Disposition: A | Discharge status: Home / Self Care | Payer: Medicare Other | Attending: Family Medicine | Admitting: Family Medicine

## 2019-07-20 ENCOUNTER — Encounter: Payer: Self-pay | Admitting: Emergency Medicine

## 2019-07-20 DIAGNOSIS — K1121 Acute sialoadenitis: Secondary | ICD-10-CM | POA: Diagnosis not present

## 2019-07-20 LAB — HM DIABETES EYE EXAM

## 2019-07-20 MED ORDER — AMOXICILLIN-POT CLAVULANATE 500-125 MG PO TABS: 1.0000 | ORAL_TABLET | Freq: Three times a day (TID) | ORAL | 0 refills | Status: DC

## 2019-07-20 NOTE — ED Triage Notes (Signed)
Patient thinks she has an abscess on one of her teeth on the right side. She states the right side of her face is swelling and this started today.

## 2019-07-20 NOTE — Discharge Instructions (Signed)
Warm compresses to area, tylenol as needed

## 2019-07-20 NOTE — ED Provider Notes (Signed)
[facility]    CSN: [id number] Arrival date & time: 07/20/19  1720      History   Chief Complaint Chief Complaint  Patient presents with  . Dental Pain    HPI Sharon York is a 74 y.o. female.   74 yo female with a c/o right jaw pain and swelling since this morning. Denies any injuries, fevers, chills. Patient states she has dentures but has not noticed any problems inside her mouth.    Dental Pain   Past Medical History:  Diagnosis Date  . Absence of bladder continence 12/15/2014  . Blood pressure elevated without history of HTN 12/15/2014  . Diabetes mellitus without complication ([place])   . Hyperlipidemia   . Osteoporosis   . Overactive bladder   . PMB (postmenopausal bleeding) 11/01/2017  . Stroke [facility])    hemiplegia on left side    Patient Active Problem List   Diagnosis Date Noted  . Type II diabetes mellitus with complication ([place]) 11/11/7626  . Right shoulder pain 03/31/2018  . Hyperlipidemia associated with type 2 diabetes mellitus ([place]) 06/12/2017  . Tinea corporis 03/17/2015  . Abnormal gait 12/15/2014  . Altered bowel function 12/15/2014  . Hemiparesis affecting left side as late effect of cerebrovascular accident (CVA) ([place]) 12/15/2014  . Obstructive sleep apnea of adult 12/15/2014  . Arthritis of shoulder region, degenerative 12/15/2014  . OP (osteoporosis) 12/15/2014  . Allergic rhinitis, seasonal 12/15/2014  . Mixed incontinence 03/11/2013    Past Surgical History:  Procedure Laterality Date  . CESAREAN SECTION  1975  . COLONOSCOPY  2010   normal    OB History   No obstetric history on file.      Home Medications    Prior to Admission medications   Medication Sig Start Date End Date Taking? Authorizing Provider  alendronate (FOSAMAX) 70 MG tablet TAKE 1 TABLET BY MOUTH ONCE A WEEK TAKE  WITH  A  FULL  GLASS  OF  WATER  ON  AN  EMPTY  STOMACH 03/22/19  Yes [name], MD  aspirin 81 MG chewable tablet Chew 1 tablet  by mouth daily.   Yes [provider]  atorvastatin (LIPITOR) 10 MG tablet Take 1 tablet by mouth once daily 05/24/19  Yes [name], MD  baclofen (LIORESAL) 10 MG tablet Take 1 tablet (10 mg total) by mouth 2 (two) times daily. 01/16/19  Yes [name], MD  Calcium Carbonate-Vitamin D (CALCIUM 500 + D) 500-125 MG-UNIT TABS Take 1 tablet by mouth daily.    Yes [provider]  metFORMIN (GLUCOPHAGE-XR) 500 MG 24 hr tablet TAKE 1 TABLET BY MOUTH WITH BREAKFAST 02/21/19  Yes [name], MD  Multiple Vitamins-Minerals (MULTIVITAMIN ADULT PO) Take by mouth.   Yes [provider]  ACCU-CHEK AVIVA PLUS test strip  USE 1 TEST STRIP UP TO 4 TIMES DAILY 05/31/17   [name], MD  ACCU-CHEK FASTCLIX LANCETS MISC 1 each by Does not apply route 2 (two) times daily. 11/26/16   [name], MD  amoxicillin-clavulanate (AUGMENTIN) 500-125 MG tablet Take 1 tablet (500 mg total) by mouth 3 (three) times daily. 07/20/19   Norval Gable, MD  blood glucose meter kit and supplies KIT Dispense based on patient and insurance preference. Use up to 3 times daily as directed to check Blood Sugar for ICD10: E11.69 Uncontrolled DM2 06/03/18   [name], MD  NUTRITIONAL SUPP - DIET AIDS PO Take by mouth. Diabetamin-  Lowers sugar and cravings.    [provider]    Family History Family History  Problem Relation Age of Onset  . Diabetes Mother   . Thyroid disease Mother   . Breast cancer Maternal Aunt   . Breast cancer Cousin        mat cousin  . Prostate cancer Brother   . Kidney failure Brother   . Kidney cancer Son   . Bladder Cancer Neg Hx     Social History Social History   Tobacco Use  . Smoking status: Never Smoker  . Smokeless tobacco: Never Used  . Tobacco comment: smoking cessation materials not required  Substance Use Topics  . Alcohol use: No    Alcohol/week: 0.0 standard drinks  . Drug use: Never     Allergies     Nitrofurantoin and Iodinated diagnostic agents   Review of Systems Review of Systems   Physical Exam Triage Vital Signs ED Triage Vitals  Enc Vitals Group     BP 07/20/19 1823 (!) 161/65     Pulse Rate 07/20/19 1823 81     Resp 07/20/19 1823 18     Temp 07/20/19 1823 98.3 F (36.8 C)     Temp Source 07/20/19 1823 Oral     SpO2 07/20/19 1823 98 %     Weight 07/20/19 1821 160 lb (72.6 kg)     Height 07/20/19 1821 '4\' 9"'  (1.448 m)     Head Circumference --      Peak Flow --      Pain Score 07/20/19 1821 3     Pain Loc --      Pain Edu? --      Excl. in [place]? --    No data found.  Updated Vital Signs BP (!) 161/65 (BP Location: Right Arm)   Pulse 81   Temp 98.3 F (36.8 C) (Oral)   Resp 18   Ht '4\' 9"'  (1.448 m)   Wt 72.6 kg   SpO2 98%   BMI 34.62 kg/m   Visual Acuity Right Eye Distance:   Left Eye Distance:   Bilateral Distance:    Right Eye Near:   Left Eye Near:    Bilateral Near:     Physical Exam Vitals and nursing note reviewed.  Constitutional:      General: She is not in acute distress.    Appearance: She is not diaphoretic.  HENT:     Head:     Jaw: Tenderness present.      Comments: Swelling and tenderness to palpation over the right parotid gland    Right Ear: Tympanic membrane normal.     Mouth/Throat:     Mouth: Mucous membranes are moist.     Dentition: Has dentures. No gum lesions.     Pharynx: Oropharynx is clear.  Musculoskeletal:     Cervical back: Normal range of motion and neck supple.  Neurological:     Mental Status: She is alert.      UC Treatments / Results  Labs (all labs ordered are listed, but only abnormal results are displayed) Labs Reviewed - No data to display  EKG   Radiology No results found.  Procedures Procedures (including critical care time)  Medications Ordered in UC Medications - No data to display  Initial Impression / Assessment and Plan / UC Course  I have reviewed the triage vital signs and  the nursing notes.  Pertinent labs & imaging results that were available during my care  of the patient were reviewed by me and considered in my medical decision making (see chart for details).      Final Clinical Impressions(s) / UC Diagnoses   Final diagnoses:  Parotitis, acute     Discharge Instructions     Warm compresses to area, tylenol as needed    ED Prescriptions    Medication Sig Dispense Auth. Provider   amoxicillin-clavulanate (AUGMENTIN) 500-125 MG tablet Take 1 tablet (500 mg total) by mouth 3 (three) times daily. 30 tablet Norval Gable, MD     1.  diagnosis reviewed with patient 2. rx as per orders above; reviewed possible side effects, interactions, risks and benefits  3. Recommend supportive treatment as above 4. Follow-up prn if symptoms worsen or don't improve   PDMP not reviewed this encounter.   Norval Gable, MD 07/20/19 [phone]

## 2019-07-26 ENCOUNTER — Other Ambulatory Visit: Payer: Self-pay | Admitting: Internal Medicine

## 2019-07-31 DIAGNOSIS — M19019 Primary osteoarthritis, unspecified shoulder: Secondary | ICD-10-CM | POA: Diagnosis not present

## 2019-07-31 DIAGNOSIS — R269 Unspecified abnormalities of gait and mobility: Secondary | ICD-10-CM | POA: Diagnosis not present

## 2019-07-31 DIAGNOSIS — E1165 Type 2 diabetes mellitus with hyperglycemia: Secondary | ICD-10-CM | POA: Diagnosis not present

## 2019-07-31 DIAGNOSIS — I69354 Hemiplegia and hemiparesis following cerebral infarction affecting left non-dominant side: Secondary | ICD-10-CM | POA: Diagnosis not present

## 2019-09-01 ENCOUNTER — Other Ambulatory Visit: Payer: Self-pay | Admitting: *Deleted

## 2019-09-04 ENCOUNTER — Encounter: Payer: Self-pay | Admitting: Family Medicine

## 2019-09-04 ENCOUNTER — Other Ambulatory Visit: Payer: Self-pay

## 2019-09-04 ENCOUNTER — Ambulatory Visit (INDEPENDENT_AMBULATORY_CARE_PROVIDER_SITE_OTHER): Payer: Medicare Other | Admitting: Family Medicine

## 2019-09-04 VITALS — BP 132/72 | HR 64 | Ht <= 58 in | Wt 162.0 lb

## 2019-09-04 DIAGNOSIS — M7591 Shoulder lesion, unspecified, right shoulder: Secondary | ICD-10-CM | POA: Diagnosis not present

## 2019-09-04 MED ORDER — MELOXICAM 7.5 MG PO TABS: 7.5000 mg | ORAL_TABLET | Freq: Every day | ORAL | 0 refills | Status: DC

## 2019-09-04 NOTE — Progress Notes (Signed)
  Date:  09/04/2019   Name:  Sharon York   DOB:  07/31/1945   MRN:  [id number]   Chief Complaint: Shoulder Pain (R0 shoulder pain radiating down R) arm. Thinks it is from "pulling up out of bed")  Shoulder Pain  The pain is present in the right shoulder. This is a new problem. The current episode started in the past 7 days. The problem occurs constantly. The problem has been waxing and waning. The pain is moderate. Associated symptoms include a limited range of motion and stiffness. Pertinent negatives include no fever, joint locking, numbness or tingling. The symptoms are aggravated by activity. She has tried nothing for the symptoms.    Lab Results  Component Value Date   CREATININE 0.66 01/16/2019   BUN 14 01/16/2019   NA 141 01/16/2019   K 4.5 01/16/2019   CL 102 01/16/2019   CO2 25 01/16/2019   Lab Results  Component Value Date   CHOL 142 01/16/2019   HDL 63 01/16/2019   LDLCALC 65 01/16/2019   TRIG 72 01/16/2019   CHOLHDL 2.3 01/16/2019   Lab Results  Component Value Date   TSH 3.740 01/16/2019   Lab Results  Component Value Date   HGBA1C 7.1 (H) 01/16/2019   Lab Results  Component Value Date   WBC 5.9 01/16/2019   HGB 12.1 01/16/2019   HCT 38.9 01/16/2019   MCV 74 (L) 01/16/2019   PLT 275 01/16/2019   Lab Results  Component Value Date   ALT 17 01/16/2019   AST 21 01/16/2019   ALKPHOS 48 01/16/2019   BILITOT 0.3 01/16/2019     Review of Systems  Constitutional: Negative.  Negative for chills, fatigue, fever and unexpected weight change.  HENT: Negative for congestion, ear discharge, ear pain, rhinorrhea, sinus pressure, sneezing and sore throat.   Eyes: Negative for photophobia, pain, discharge, redness and itching.  Respiratory: Negative for cough, shortness of breath, wheezing and stridor.   Gastrointestinal: Negative for abdominal pain, blood in stool, constipation, diarrhea, nausea and vomiting.  Endocrine: Negative for cold intolerance, heat  intolerance, polydipsia, polyphagia and polyuria.  Genitourinary: Negative for dysuria, flank pain, frequency, hematuria, menstrual problem, pelvic pain, urgency, vaginal bleeding and vaginal discharge.  Musculoskeletal: Positive for stiffness. Negative for arthralgias, back pain and myalgias.  Skin: Negative for rash.  Allergic/Immunologic: Negative for environmental allergies and food allergies.  Neurological: Negative for dizziness, tingling, weakness, light-headedness, numbness and headaches.  Hematological: Negative for adenopathy. Does not bruise/bleed easily.  Psychiatric/Behavioral: Negative for dysphoric mood. The patient is not nervous/anxious.     Patient Active Problem List   Diagnosis Date Noted  . Type II diabetes mellitus with complication ([facility]) 04/21/2018  . Right shoulder pain 03/31/2018  . Hyperlipidemia associated with type 2 diabetes mellitus ([facility]) 06/12/2017  . Tinea corporis 03/17/2015  . Abnormal gait 12/15/2014  . Altered bowel function 12/15/2014  . Hemiparesis affecting left side as late effect of cerebrovascular accident (CVA) ([facility]) 12/15/2014  . Obstructive sleep apnea of adult 12/15/2014  . Arthritis of shoulder region, degenerative 12/15/2014  . OP (osteoporosis) 12/15/2014  . Allergic rhinitis, seasonal 12/15/2014  . Mixed incontinence 03/11/2013    Allergies  Allergen Reactions  . Nitrofurantoin Nausea Only  . Iodinated Diagnostic Agents Hives and Cough    Patient developed a hive on her left lip after injection as well as some coughing.     Past Surgical History:  Procedure Laterality Date  . CESAREAN SECTION  1975  .   COLONOSCOPY  2010   normal    Social History   Tobacco Use  . Smoking status: Never Smoker  . Smokeless tobacco: Never Used  . Tobacco comment: smoking cessation materials not required  Substance Use Topics  . Alcohol use: No    Alcohol/week: 0.0 standard drinks  . Drug use: Never     Medication list has been reviewed  and updated.  Current Meds  Medication Sig  . ACCU-CHEK AVIVA PLUS test strip  USE 1 TEST STRIP UP TO 4 TIMES DAILY  . Accu-Chek Softclix Lancets lancets USE TO CHECK GLUCOSE UP TO 4 TIMES DAILY  . alendronate (FOSAMAX) 70 MG tablet TAKE 1 TABLET BY MOUTH ONCE A WEEK TAKE  WITH  A  FULL  GLASS  OF  WATER  ON  AN  EMPTY  STOMACH  . aspirin 81 MG chewable tablet Chew 1 tablet by mouth daily.  Marland Kitchen atorvastatin (LIPITOR) 10 MG tablet Take 1 tablet by mouth once daily  . baclofen (LIORESAL) 10 MG tablet Take 1 tablet (10 mg total) by mouth 2 (two) times daily.  . blood glucose meter kit and supplies KIT Dispense based on patient and insurance preference. Use up to 3 times daily as directed to check Blood Sugar for ICD10: E11.69 Uncontrolled DM2  . Calcium Carbonate-Vitamin D (CALCIUM 500 + D) 500-125 MG-UNIT TABS Take 1 tablet by mouth daily.   . metFORMIN (GLUCOPHAGE-XR) 500 MG 24 hr tablet TAKE 1 TABLET BY MOUTH WITH BREAKFAST  . Multiple Vitamins-Minerals (MULTIVITAMIN ADULT PO) Take by mouth.    PHQ 2/9 Scores 09/04/2019 05/18/2019 03/11/2019 01/16/2019  PHQ - 2 Score 0 0 0 0  PHQ- 9 Score 0 - - -    BP Readings from Last 3 Encounters:  09/04/19 132/72  07/20/19 (!) 161/65  06/29/19 135/85    Physical Exam Vitals and nursing note reviewed.  Constitutional:      General: She is not in acute distress.    Appearance: She is not diaphoretic.  HENT:     Head: Normocephalic and atraumatic.     Right Ear: External ear normal.     Left Ear: External ear normal.     Nose: Nose normal.  Eyes:     General:        Right eye: No discharge.        Left eye: No discharge.     Conjunctiva/sclera: Conjunctivae normal.     Pupils: Pupils are equal, round, and reactive to light.  Neck:     Thyroid: No thyromegaly.     Vascular: No JVD.  Cardiovascular:     Rate and Rhythm: Normal rate and regular rhythm.     Heart sounds: Normal heart sounds. No murmur. No friction rub. No gallop.     Pulmonary:     Effort: Pulmonary effort is normal.     Breath sounds: Normal breath sounds.  Abdominal:     General: Bowel sounds are normal.     Palpations: Abdomen is soft. There is no mass.     Tenderness: There is no abdominal tenderness. There is no guarding.  Musculoskeletal:     Right shoulder: Tenderness present. Decreased range of motion.     Cervical back: Normal range of motion and neck supple.     Comments: supraspinatis tenderness  Lymphadenopathy:     Cervical: No cervical adenopathy.  Skin:    General: Skin is warm and dry.  Neurological:     Mental Status: She is  alert.     Deep Tendon Reflexes: Reflexes are normal and symmetric.     Wt Readings from Last 3 Encounters:  09/04/19 162 lb (73.5 kg)  07/20/19 160 lb (72.6 kg)  06/29/19 154 lb (69.9 kg)    BP 132/72   York 62   Ht 4' 9" (1.448 m)   Wt 162 lb (73.5 kg)   BMI 35.06 kg/m   Assessment and Plan: 1. Supraspinatus tendinitis, right New onset.  Uncontrolled pain but manageable.  Stable.  Patient had a fall in February of this year mostly involving the left side.  Patient has a history of left hemiparesis of the left side.  Patient began having discomfort the early part of this week in the right shoulder.  There has been no fall to suggest any bony injury in the recent past and this is most likely reflecting soft tissue concerns.  Exam notes tenderness of the supraspinatus tendon consistent with a tendinitis.  We will start meloxicam 7.5 mg once a day for antiinflammation and encouraged the use of ice over the area for discomfort during the day. Patient has been given instructions to use Tylenol during the course of the day if pain has break through circumstances with the meloxicam.  Patient is to return if there is continued pain. - meloxicam (MOBIC) 7.5 MG tablet; Take 1 tablet (7.5 mg total) by mouth daily.  Dispense: 30 tablet; Refill: 0

## 2019-09-07 NOTE — Patient Outreach (Addendum)
[place] [facility]) Care Management  [place]  09/01/2019 Late entry  Sharon York [birth year] [phone]  [place] telephone call to patient.  [employer] compliance verified. Per patient she is doing good. Patient fasting blood sugar is 156. Patient has been exercising by walking 2000 steps a day. Per patient she had a recent fall. Patient hit head and was checked out in ED. Per patient she is thinking about asking for more rehab. Patient has received her COVID vaccines. Patient is trying to eat healthy. Per patient she is taking medications as per ordered. Patient has agreed to further outreach calls.    Encounter Medications:  Outpatient Encounter Medications as of 09/01/2019  Medication Sig Note  . ACCU-CHEK AVIVA PLUS test strip  USE 1 TEST STRIP UP TO 4 TIMES DAILY   . Accu-Chek Softclix Lancets lancets USE TO CHECK GLUCOSE UP TO 4 TIMES DAILY   . alendronate (FOSAMAX) 70 MG tablet TAKE 1 TABLET BY MOUTH ONCE A WEEK TAKE  WITH  A  FULL  GLASS  OF  WATER  ON  AN  EMPTY  STOMACH   . aspirin 81 MG chewable tablet Chew 1 tablet by mouth daily. 12/15/2014: Received from: [place]: Chew by mouth.  Marland Kitchen atorvastatin (LIPITOR) 10 MG tablet Take 1 tablet by mouth once daily   . baclofen (LIORESAL) 10 MG tablet Take 1 tablet (10 mg total) by mouth 2 (two) times daily.   . blood glucose meter kit and supplies KIT Dispense based on patient and insurance preference. Use up to 3 times daily as directed to check Blood Sugar for ICD10: E11.69 Uncontrolled DM2   . Calcium Carbonate-Vitamin D (CALCIUM 500 + D) 500-125 MG-UNIT TABS Take 1 tablet by mouth daily.  12/17/2014: Received from: [facility]  . metFORMIN (GLUCOPHAGE-XR) 500 MG 24 hr tablet TAKE 1 TABLET BY MOUTH WITH BREAKFAST   . Multiple Vitamins-Minerals (MULTIVITAMIN ADULT PO) Take by mouth.   . [DISCONTINUED] amoxicillin-clavulanate (AUGMENTIN) 500-125 MG tablet Take 1 tablet (500 mg  total) by mouth 3 (three) times daily.   . [DISCONTINUED] NUTRITIONAL SUPP - DIET AIDS PO Take by mouth. Diabetamin- Lowers sugar and cravings.    No facility-administered encounter medications on file as of 09/01/2019.    Functional Status:  In your present state of health, do you have any difficulty performing the following activities: 03/11/2019 01/12/2019  Hearing? N Y  Comment slight hoh has hearing aids  Vision? N N  Comment - wears glasses  Difficulty concentrating or making decisions? N N  Walking or climbing stairs? Y Y  Comment uses cane or walker/ results from cva -  Dressing or bathing? N N  Doing errands, shopping? Y N  Comment daughter does errands -  Conservation officer, nature and eating ? Y N  Comment reeceives meals on wheels -  Using the Toilet? N N  In the past six months, have you accidently leaked urine? Y Y  Do you have problems with loss of bowel control? N N  Managing your Medications? N N  Managing your Finances? Aggie Moats  Comment daughter assists -  Housekeeping or managing your Housekeeping? Y N  Comment daughter assists -  Some recent data might be hidden    Fall/Depression Screening: Fall Risk  09/04/2019 06/03/2019 03/11/2019  Falls in the past year? 0 0 0  Comment - - -  Number falls in past yr: - 0 0  Injury with Fall? -  0 0  Risk Factor Category  - - -  Risk for fall due to : - History of fall(s);Impaired balance/gait;Impaired mobility Impaired balance/gait;Impaired mobility  Risk for fall due to: Comment - - -  Follow up Falls evaluation completed Falls evaluation completed;Falls prevention discussed Falls evaluation completed;Falls prevention discussed   PHQ 2/9 Scores 09/04/2019 05/18/2019 03/11/2019 01/16/2019 01/16/2019 01/12/2019 10/29/2018  PHQ - 2 Score 0 0 0 0 0 0 0  PHQ- 9 Score 0 - - - 0 2 -   THN CM Care Plan Problem One     Most Recent Value  Care Plan Problem One  Knowledge Deficit in Self Management of Diabetes  Role Documenting the Problem One   [place] for Problem One  Active  THN Long Term Goal   Patient will see a decrease in A1C 7.1 within the next 90 days  Interventions for Problem One Long Term Goal  RN reiterated what the patient A1C is. Her fasting blood sugar is 156. RN discussed maintaining her goal of 7 and below. RN will follow up for further discussion and encouragement  THN CM Short Term Goal #2   Patient will report doing arm exercises within the next 30 days  THN CM Short Term Goal #3  patient will verbailize not havinga fall within the next 30 days  THN CM Short Term Goal #3 Start Date  09/01/19  Interventions for Short Tern Goal #3  Rn discussed fall prevention. Patient is going to talk with Dr about possibly more physical therapy. RN will follow up with further discussion.  THN CM Short Term Goal #4 Met Date  09/01/19  THN CM Short Term Goal #5   Patient will verbalize getting a pedometer within the next 30 days  Interventions for Short Term Goal #5  Patient is getting in 2000 steps a day. RN is encouraging patinet to increase steps to 300 this week. RN will follow up with further encouraging of patient with activity      Assessment:  Patient had recent fall Patient received COVID vaccine  Patient is adhering to diet Patient is doing 2000 steps a day  Plan:  RN discussed fall prevention RN discussed increasing exercise to 3000 steps a day Patient will continue to try to adhere to diet RN will follow up outreach call within the month of July  Sharon York [place] [facility] [phone]

## 2019-09-21 NOTE — Telephone Encounter (Signed)
Error

## 2019-09-29 ENCOUNTER — Encounter: Payer: Self-pay | Admitting: Emergency Medicine

## 2019-09-29 ENCOUNTER — Other Ambulatory Visit: Payer: Self-pay

## 2019-09-29 ENCOUNTER — Ambulatory Visit
Admission: EM | Admit: 2019-09-29 | Discharge: 2019-09-29 | Disposition: A | Discharge status: Home / Self Care | Payer: Medicare Other | Attending: Family Medicine | Admitting: Family Medicine

## 2019-09-29 DIAGNOSIS — Y92009 Unspecified place in unspecified non-institutional (private) residence as the place of occurrence of the external cause: Secondary | ICD-10-CM

## 2019-09-29 DIAGNOSIS — W19XXXA Unspecified fall, initial encounter: Secondary | ICD-10-CM | POA: Diagnosis not present

## 2019-09-29 DIAGNOSIS — I69354 Hemiplegia and hemiparesis following cerebral infarction affecting left non-dominant side: Secondary | ICD-10-CM

## 2019-09-29 NOTE — Discharge Instructions (Addendum)
No evidence of trauma/injury.  Keep a close eye.  If you have any additional issues or concerns, let me know.  Take care  Dr. Adriana Simas

## 2019-09-29 NOTE — ED Triage Notes (Addendum)
Pt states she fell just prior to arrival in her bathroom. She states she has a riser on the toilet and it moved out from under her while trying to get up and she fell on it. She states she is not having pain anywhere but wants to be checked out. She states she did not hit her head. She states she had a stroke in the past ans it affected her left side of her body, when she fell she landed on her left side.

## 2019-09-29 NOTE — ED Provider Notes (Addendum)
[facility]    CSN: [id number] Arrival date & time: 09/29/19  1102  History   Chief Complaint Chief Complaint  Patient presents with  . Fall   HPI  74 year old female presents with the above complaint.  Patient states that she has seat/riser from the toilet.  It slid out from under her and she states that she slipped towards the floor.  Patient states that she did not really impact the floor.  She denies pain or swelling.  She states that she is concerned that she may have injured the left side of her body.  No head injury.  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  She is concerned about occult injury due to altered sensation.  She states that her daughter thought it be best that she be evaluated.  No other associated symptoms.  No other complaints at this time.  Past Medical History:  Diagnosis Date  . Absence of bladder continence 12/15/2014  . Blood pressure elevated without history of HTN 12/15/2014  . Diabetes mellitus without complication ([place])   . Hyperlipidemia   . Osteoporosis   . Overactive bladder   . PMB (postmenopausal bleeding) 11/01/2017  . Stroke [facility])    hemiplegia on left side   Patient Active Problem List   Diagnosis Date Noted  . Type II diabetes mellitus with complication ([place]) 38/25/0539  . Right shoulder pain 03/31/2018  . Hyperlipidemia associated with type 2 diabetes mellitus ([place]) 06/12/2017  . Tinea corporis 03/17/2015  . Abnormal gait 12/15/2014  . Altered bowel function 12/15/2014  . Hemiparesis affecting left side as late effect of cerebrovascular accident (CVA) ([place]) 12/15/2014  . Obstructive sleep apnea of adult 12/15/2014  . Arthritis of shoulder region, degenerative 12/15/2014  . OP (osteoporosis) 12/15/2014  . Allergic rhinitis, seasonal 12/15/2014  . Mixed incontinence 03/11/2013   Past Surgical History:  Procedure Laterality Date  . CESAREAN SECTION  1975  . COLONOSCOPY  2010   normal   OB History   No  obstetric history on file.    Home Medications    Prior to Admission medications   Medication Sig Start Date End Date Taking? Authorizing Provider  alendronate (FOSAMAX) 70 MG tablet TAKE 1 TABLET BY MOUTH ONCE A WEEK TAKE  WITH  A  FULL  GLASS  OF  WATER  ON  AN  EMPTY  STOMACH 03/22/19  Yes [name], MD  aspirin 81 MG chewable tablet Chew 1 tablet by mouth daily.   Yes [provider]  atorvastatin (LIPITOR) 10 MG tablet Take 1 tablet by mouth once daily 05/24/19  Yes [name], MD  baclofen (LIORESAL) 10 MG tablet Take 1 tablet (10 mg total) by mouth 2 (two) times daily. 01/16/19  Yes [name], MD  blood glucose meter kit and supplies KIT Dispense based on patient and insurance preference. Use up to 3 times daily as directed to check Blood Sugar for ICD10: E11.69 Uncontrolled DM2 06/03/18  Yes [name], MD  Calcium Carbonate-Vitamin D (CALCIUM 500 + D) 500-125 MG-UNIT TABS Take 1 tablet by mouth daily.    Yes [provider]  meloxicam (MOBIC) 7.5 MG tablet Take 1 tablet (7.5 mg total) by mouth daily. 09/04/19  Yes Juline Patch, MD  metFORMIN (GLUCOPHAGE-XR) 500 MG 24 hr tablet TAKE 1 TABLET BY MOUTH WITH BREAKFAST 02/21/19  Yes [name], MD  Multiple Vitamins-Minerals (MULTIVITAMIN ADULT PO) Take by mouth.   Yes [provider]  Accu-Chek Softclix Lancets lancets USE TO CHECK GLUCOSE UP TO 4 TIMES DAILY 10/05/19   [name], MD  glucose blood test strip USE 1 STRIP TO CHECK GLUCOSE UP TO THREE TIMES DAILY 10/05/19   [name], MD    Family History Family History  Problem Relation Age of Onset  . Diabetes Mother   . Thyroid disease Mother   . Breast cancer Maternal Aunt   . Breast cancer Cousin        mat cousin  . Prostate cancer Brother   . Kidney failure Brother   . Kidney cancer Son   . Bladder Cancer Neg Hx     Social History Social History   Tobacco Use  . Smoking status: Never Smoker  .  Smokeless tobacco: Never Used  . Tobacco comment: smoking cessation materials not required  Substance Use Topics  . Alcohol use: No    Alcohol/week: 0.0 standard drinks  . Drug use: Never     Allergies   Nitrofurantoin and Iodinated diagnostic agents   Review of Systems Review of Systems  HENT:       No head injury.  Musculoskeletal:       Denies pain.  Skin: Negative.    Physical Exam Triage Vital Signs ED Triage Vitals  Enc Vitals Group     BP 09/29/19 1133 (!) 149/81     Pulse Rate 09/29/19 1133 80     Resp 09/29/19 1133 18     Temp 09/29/19 1133 98 F (36.7 C)     Temp Source 09/29/19 1133 Oral     SpO2 09/29/19 1133 97 %     Weight 09/29/19 1128 162 lb 0.6 oz (73.5 kg)     Height 09/29/19 1128 '4\' 9"'  (1.448 m)     Head Circumference --      Peak Flow --      Pain Score 09/29/19 1128 0     Pain Loc --      Pain Edu? --      Excl. in [place]? --    Updated Vital Signs BP (!) 149/81 (BP Location: Right Arm)   Pulse 80   Temp 98 F (36.7 C) (Oral)   Resp 18   Ht '4\' 9"'  (1.448 m)   Wt 73.5 kg   SpO2 97%   BMI 35.06 kg/m   Visual Acuity Right Eye Distance:   Left Eye Distance:   Bilateral Distance:    Right Eye Near:   Left Eye Near:    Bilateral Near:     Physical Exam Constitutional:      General: She is not in acute distress.    Appearance: Normal appearance.  HENT:     Head: Normocephalic and atraumatic.  Eyes:     General:        Right eye: No discharge.        Left eye: No discharge.     Conjunctiva/sclera: Conjunctivae normal.  Pulmonary:     Effort: Pulmonary effort is normal. No respiratory distress.  Musculoskeletal:     Comments: No appreciable joint swelling, erythema, or bruising of the left lower extremity or right upper extremity.  Neurological:     Mental Status: She is alert.     Comments: Left-sided hemiparesis.  Contracture of the left upper extremity.  Psychiatric:        Behavior: Behavior normal.     Comments: Flat  affect.    UC Treatments / Results  Labs (all labs  ordered are listed, but only abnormal results are displayed) Labs Reviewed - No data to display  EKG   Radiology No results found.  Procedures Procedures (including critical care time)  Medications Ordered in UC Medications - No data to display  Initial Impression / Assessment and Plan / UC Course  I have reviewed the triage vital signs and the nursing notes.  Pertinent labs & imaging results that were available during my care of the patient were reviewed by me and considered in my medical decision making (see chart for details).    74 year old female presents with a fall at home.  No evidence of trauma or injury today.  No indication for imaging.  Supportive care.  Final Clinical Impressions(s) / UC Diagnoses   Final diagnoses:  Fall in home, initial encounter  Hemiparesis affecting left side as late effect of cerebrovascular accident (CVA) [facility])     Discharge Instructions     No evidence of trauma/injury.  Keep a close eye.  If you have any additional issues or concerns, let me know.  Take care  Dr. Lacinda Axon     ED Prescriptions    None     PDMP not reviewed this encounter.   Coral Spikes, DO 09/29/19 1237    Coral Spikes, DO 10/10/19 0825

## 2019-10-05 ENCOUNTER — Other Ambulatory Visit: Payer: Self-pay | Admitting: Internal Medicine

## 2019-10-05 NOTE — Telephone Encounter (Signed)
Requested Prescriptions  Pending Prescriptions Disp Refills  . glucose blood test strip [Pharmacy Med Name: Accu-Chek Aviva Plus In Vitro Strip] 400 each 3    Sig: USE 1 STRIP TO CHECK GLUCOSE UP TO THREE TIMES DAILY     Endocrinology: Diabetes - Testing Supplies Passed - 10/05/2019 10:29 AM      Passed - Valid encounter within last 12 months    Recent Outpatient Visits          1 month ago Supraspinatus tendinitis, right   [facility] Duanne Limerick, MD   4 months ago Primary osteoarthritis of right hand   [facility] Reubin Milan, MD   8 months ago Annual physical exam   [facility] Reubin Milan, MD   1 year ago Uncontrolled type 2 diabetes mellitus without complication, without long-term current use of insulin [facility])   [facility] Reubin Milan, MD   1 year ago Cellulitis of finger of right hand   [facility] Reubin Milan, MD      Future Appointments            In 1 month McGowan, Elana Alm [facility] [place]   In 3 months Judithann Riggan Nyoka Cowden, MD [facility]

## 2019-11-16 DIAGNOSIS — H1132 Conjunctival hemorrhage, left eye: Secondary | ICD-10-CM | POA: Diagnosis not present

## 2019-11-30 ENCOUNTER — Ambulatory Visit: Payer: Medicare Other | Admitting: Urology

## 2019-12-01 ENCOUNTER — Other Ambulatory Visit: Payer: Self-pay | Admitting: *Deleted

## 2019-12-01 ENCOUNTER — Telehealth: Payer: Self-pay | Admitting: Internal Medicine

## 2019-12-01 NOTE — Telephone Encounter (Signed)
Per Marchelle Folks- pt decided to switch PCPs and we canceled her upcoming appts and took Dr Judithann Vicens out as the PCP for patient per her preference.   CM

## 2019-12-01 NOTE — Patient Outreach (Signed)
[facility]) Care Management  [place]  12/01/2019   Sharon York [birth year] [phone]  RN Health Coach telephone call to patient.  [employer] compliance verified. Per patient she is doing good. Patient had a fall in May with ED visit but not injuries. Patient has been checking her blood sugars ar per ordered 4x a day. Fasting blood sugars are ranging from 138-150. Patient is currently exercising doing 1000 steps a day. Patient A1C is 7.1. Patient has ordered and received the [facility] medical alert system and has purchased a new car with on star medical assistance when driving. Patient is trying to loose some weight. She needs assistance with portion control measurements. Patient has agreed to follow up outreach calls.   Encounter Medications:  Outpatient Encounter Medications as of 12/01/2019  Medication Sig Note  . Accu-Chek Softclix Lancets lancets USE TO CHECK GLUCOSE UP TO 4 TIMES DAILY   . alendronate (FOSAMAX) 70 MG tablet TAKE 1 TABLET BY MOUTH ONCE A WEEK TAKE  WITH  A  FULL  GLASS  OF  WATER  ON  AN  EMPTY  STOMACH   . aspirin 81 MG chewable tablet Chew 1 tablet by mouth daily. 12/15/2014: Received from: [place]: Chew by mouth.  Marland Kitchen atorvastatin (LIPITOR) 10 MG tablet Take 1 tablet by mouth once daily   . baclofen (LIORESAL) 10 MG tablet Take 1 tablet (10 mg total) by mouth 2 (two) times daily.   . blood glucose meter kit and supplies KIT Dispense based on patient and insurance preference. Use up to 3 times daily as directed to check Blood Sugar for ICD10: E11.69 Uncontrolled DM2   . Calcium Carbonate-Vitamin D (CALCIUM 500 + D) 500-125 MG-UNIT TABS Take 1 tablet by mouth daily.  12/17/2014: Received from: [facility]  . glucose blood test strip USE 1 STRIP TO CHECK GLUCOSE UP TO THREE TIMES DAILY   . meloxicam (MOBIC) 7.5 MG tablet Take 1 tablet (7.5 mg total) by mouth daily.   . metFORMIN (GLUCOPHAGE-XR) 500 MG 24 hr tablet TAKE  1 TABLET BY MOUTH WITH BREAKFAST   . Multiple Vitamins-Minerals (MULTIVITAMIN ADULT PO) Take by mouth.    No facility-administered encounter medications on file as of 12/01/2019.    Functional Status:  In your present state of health, do you have any difficulty performing the following activities: 12/01/2019 03/11/2019  Hearing? Y N  Comment patient has bilateral hearing aids slight hoh  Vision? N N  Comment - -  Difficulty concentrating or making decisions? N N  Walking or climbing stairs? Y Y  Comment uses a cane and hemiparesis uses cane or walker/ results from cva  Dressing or bathing? Y N  Comment yes daughter assists -  Doing errands, shopping? N Y  Comment - daughter does Science writer and eating ? N Y  Comment - reeceives meals on wheels  Using the Toilet? N N  In the past six months, have you accidently leaked urine? N Y  Do you have problems with loss of bowel control? N N  Managing your Medications? N N  Managing your Finances? Tempie Donning  Comment daughter assists daughter assists  Housekeeping or managing your Housekeeping? Tempie Donning  Comment daughter assists/ Patient lives with daughter daughter assists  Some recent data might be hidden    Fall/Depression Screening: Fall Risk  12/01/2019 09/04/2019 06/03/2019  Falls in the past year? 1 0 0  Comment - - -  Number falls in past yr: 0 - 0  Injury with Fall? 0 - 0  Risk Factor Category  - - -  Risk for fall due to : History of fall(s);Impaired balance/gait;Impaired mobility - History of fall(s);Impaired balance/gait;Impaired mobility  Risk for fall due to: Comment - - -  Follow up Falls evaluation completed;Falls prevention discussed Falls evaluation completed Falls evaluation completed;Falls prevention discussed   PHQ 2/9 Scores 09/04/2019 05/18/2019 03/11/2019 01/16/2019 01/16/2019 01/12/2019 10/29/2018  PHQ - 2 Score 0 0 0 0 0 0 0  PHQ- 9 Score 0 - - - 0 2 -   Goals Addressed              This Visit's Progress   .   Diabetes Patient stated goal (pt-stated)        .CARE PLAN ENTRY (see longtitudinal plan of care for additional care plan information)  Objective:  Lab Results  Component Value Date   HGBA1C 7.1 (H) 01/16/2019 .   Lab Results  Component Value Date   CREATININE 0.66 01/16/2019   CREATININE 0.63 09/19/2018   CREATININE 0.77 04/21/2018 .   Marland Kitchen No results found for: EGFR  Current Barriers:  Marland Kitchen Knowledge Deficits related to medications used for management of diabetes  Case Manager Clinical Goal(s):  Over the next 90 days, patient will demonstrate improved adherence to prescribed treatment plan for diabetes self care/management as evidenced by fasting blood sugars being 80-130. . Verbalize daily monitoring and recording of CBG within 90 days . Verbalize adherence to ADA/ carb modified diet within the next 90 days . Verbalize exercise 3 days/week . Verbalize following portion control  Interventions:  . Discussed plans with patient for ongoing care management follow up and provided patient with direct contact information for care management team . Reviewed scheduled/upcoming provider appointments including: A1C blood draws . RN sent educational material Exercise program activity booklet . RN sent educational material on planning healthy meals to help with portion control  Patient Self Care Activities:   Patient verbalized motivation regarding increasing her physical activity by doing chair exercises daily while watching TV and increasing walking up to 2000 steps a day  Patient verbalized portion control when eating and monitoring weight.   RN will follow up outreach within the month of October         Assessment:  A1C 7.1 FBS ranging from 13150 Patient is walking 1000 steps a day Patient is looking at joining a new gym closer to home Patient has received [facility] medical alert Plan:  RN sent Planning healthy meals RN discussed measurements of portions of food RN sent Exercise  program activity booklet RN will follow up within the month of October RN sent PCP update assessment  [place] Management [phone]

## 2019-12-01 NOTE — Telephone Encounter (Signed)
Spoke with ms Dasher to inform her that we are under the same tax ID and was not sure why [employer] told her that she was able to see dr Yetta Barre and that dr Yetta Barre was in network with [employer]. She was informed that she needed to find another PCP who is in network with [employer].

## 2019-12-01 NOTE — Telephone Encounter (Signed)
Copied from CRM [id number]. Topic: General - Inquiry >> Dec 01, 2019 10:41 AM Deborha Payment wrote: Reason for CRM: Patient is requesting to [facility] from [place] to [place]. Call back [phone]

## 2019-12-02 ENCOUNTER — Ambulatory Visit: Payer: Self-pay | Admitting: Family Medicine

## 2019-12-03 ENCOUNTER — Other Ambulatory Visit: Payer: Self-pay | Admitting: Internal Medicine

## 2019-12-03 ENCOUNTER — Ambulatory Visit (INDEPENDENT_AMBULATORY_CARE_PROVIDER_SITE_OTHER): Payer: Medicare Other | Admitting: Internal Medicine

## 2019-12-03 ENCOUNTER — Other Ambulatory Visit: Payer: Self-pay

## 2019-12-03 ENCOUNTER — Encounter: Payer: Self-pay | Admitting: Internal Medicine

## 2019-12-03 VITALS — BP 128/62 | HR 79 | Temp 98.0°F | Ht <= 58 in | Wt 163.0 lb

## 2019-12-03 DIAGNOSIS — E118 Type 2 diabetes mellitus with unspecified complications: Secondary | ICD-10-CM

## 2019-12-03 DIAGNOSIS — E785 Hyperlipidemia, unspecified: Secondary | ICD-10-CM | POA: Diagnosis not present

## 2019-12-03 DIAGNOSIS — E1169 Type 2 diabetes mellitus with other specified complication: Secondary | ICD-10-CM | POA: Diagnosis not present

## 2019-12-03 DIAGNOSIS — L299 Pruritus, unspecified: Secondary | ICD-10-CM

## 2019-12-03 NOTE — Progress Notes (Signed)
Date:  12/03/2019   Name:  Sharon York   DOB:  [birth year]   MRN:  [id number]   Chief Complaint: itchy scalp (X 1 week. Had this problem last year and went away. She said she uses Gel and Hair Spray and it started after that. ) and Diabetes (Foot exam, A1C.)  Diabetes She presents for her follow-up diabetic visit. She has type 2 diabetes mellitus. Her disease course has been stable. Pertinent negatives for hypoglycemia include no headaches or tremors. Pertinent negatives for diabetes include no chest pain, no fatigue, no polydipsia and no polyuria. Current diabetic treatment includes oral agent (monotherapy). She is compliant with treatment all of the time. An ACE inhibitor/angiotensin II receptor blocker is not being taken.  Hyperlipidemia This is a chronic problem. Pertinent negatives include no chest pain or shortness of breath. Current antihyperlipidemic treatment includes statins. The current treatment provides significant improvement of lipids. There are no compliance problems.    Scalp issues - she has developed generalized itching of the scalp with no lesions noted.  She stopped one of her hair products and the itching resolved.    Lab Results  Component Value Date   CREATININE 0.66 01/16/2019   BUN 14 01/16/2019   NA 141 01/16/2019   K 4.5 01/16/2019   CL 102 01/16/2019   CO2 25 01/16/2019   Lab Results  Component Value Date   CHOL 142 01/16/2019   HDL 63 01/16/2019   LDLCALC 65 01/16/2019   TRIG 72 01/16/2019   CHOLHDL 2.3 01/16/2019   Lab Results  Component Value Date   TSH 3.740 01/16/2019   Lab Results  Component Value Date   HGBA1C 7.1 (H) 01/16/2019   Lab Results  Component Value Date   WBC 5.9 01/16/2019   HGB 12.1 01/16/2019   HCT 38.9 01/16/2019   MCV 74 (L) 01/16/2019   PLT 275 01/16/2019   Lab Results  Component Value Date   ALT 17 01/16/2019   AST 21 01/16/2019   ALKPHOS 48 01/16/2019   BILITOT 0.3 01/16/2019     Review of Systems    Constitutional: Negative for appetite change, fatigue, fever and unexpected weight change.  HENT: Negative for tinnitus and trouble swallowing.   Eyes: Negative for visual disturbance.  Respiratory: Negative for cough, chest tightness and shortness of breath.   Cardiovascular: Negative for chest pain, palpitations and leg swelling.  Gastrointestinal: Negative for abdominal pain.  Endocrine: Negative for polydipsia and polyuria.  Genitourinary: Negative for dysuria and hematuria.  Musculoskeletal: Positive for gait problem. Negative for arthralgias.  Skin: Negative for color change and rash.       Itching scalp  Neurological: Negative for tremors, numbness and headaches.  Psychiatric/Behavioral: Negative for dysphoric mood.    Patient Active Problem List   Diagnosis Date Noted  . Type II diabetes mellitus with complication ([place]) 43/32/9518  . Right shoulder pain 03/31/2018  . Hyperlipidemia associated with type 2 diabetes mellitus ([place]) 06/12/2017  . Tinea corporis 03/17/2015  . Abnormal gait 12/15/2014  . Altered bowel function 12/15/2014  . Hemiparesis affecting left side as late effect of cerebrovascular accident (CVA) ([place]) 12/15/2014  . Obstructive sleep apnea of adult 12/15/2014  . Arthritis of shoulder region, degenerative 12/15/2014  . OP (osteoporosis) 12/15/2014  . Allergic rhinitis, seasonal 12/15/2014  . Mixed incontinence 03/11/2013    Allergies  Allergen Reactions  . Nitrofurantoin Nausea Only  . Iodinated Diagnostic Agents Hives and Cough    Patient developed a  hive on her left lip after injection as well as some coughing.     Past Surgical History:  Procedure Laterality Date  . CESAREAN SECTION  1975  . COLONOSCOPY  2010   normal    Social History   Tobacco Use  . Smoking status: Never Smoker  . Smokeless tobacco: Never Used  . Tobacco comment: smoking cessation materials not required  Vaping Use  . Vaping Use: Never used  Substance Use Topics  .  Alcohol use: No    Alcohol/week: 0.0 standard drinks  . Drug use: Never     Medication list has been reviewed and updated.  Current Meds  Medication Sig  . Accu-Chek Softclix Lancets lancets USE TO CHECK GLUCOSE UP TO 4 TIMES DAILY  . alendronate (FOSAMAX) 70 MG tablet TAKE 1 TABLET BY MOUTH ONCE A WEEK TAKE  WITH  A  FULL  GLASS  OF  WATER  ON  AN  EMPTY  STOMACH  . aspirin 81 MG chewable tablet Chew 1 tablet by mouth daily.  Marland Kitchen atorvastatin (LIPITOR) 10 MG tablet Take 1 tablet by mouth once daily  . baclofen (LIORESAL) 10 MG tablet Take 1 tablet (10 mg total) by mouth 2 (two) times daily.  . blood glucose meter kit and supplies KIT Dispense based on patient and insurance preference. Use up to 3 times daily as directed to check Blood Sugar for ICD10: E11.69 Uncontrolled DM2  . Calcium Carbonate-Vitamin D (CALCIUM 500 + D) 500-125 MG-UNIT TABS Take 1 tablet by mouth daily.   Marland Kitchen glucose blood test strip USE 1 STRIP TO CHECK GLUCOSE UP TO THREE TIMES DAILY  . metFORMIN (GLUCOPHAGE-XR) 500 MG 24 hr tablet TAKE 1 TABLET BY MOUTH WITH BREAKFAST  . Multiple Vitamins-Minerals (MULTIVITAMIN ADULT PO) Take by mouth.    PHQ 2/9 Scores 12/03/2019 09/04/2019 05/18/2019 03/11/2019  PHQ - 2 Score 0 0 0 0  PHQ- 9 Score 0 0 - -    GAD 7 : Generalized Anxiety Score 12/03/2019 09/04/2019  Nervous, Anxious, on Edge 0 0  Control/stop worrying 0 1  Worry too much - different things 0 0  Trouble relaxing 0 0  Restless 0 0  Easily annoyed or irritable 0 0  Afraid - awful might happen 0 0  Total GAD 7 Score 0 1  Anxiety Difficulty Not difficult at all Not difficult at all    BP Readings from Last 3 Encounters:  12/03/19 128/62  09/29/19 (!) 149/81  09/04/19 132/72    Physical Exam Constitutional:      Appearance: Normal appearance.  Cardiovascular:     Rate and Rhythm: Normal rate and regular rhythm.     Pulses: Normal pulses.  Pulmonary:     Effort: Pulmonary effort is normal.     Breath  sounds: Normal breath sounds and air entry. No wheezing or rhonchi.  Abdominal:     Palpations: Abdomen is soft.     Tenderness: There is no abdominal tenderness.  Musculoskeletal:     Right hand: No swelling, deformity, tenderness or bony tenderness. Normal range of motion. Normal pulse.     Cervical back: Normal range of motion.     Right lower leg: No edema.     Left lower leg: No edema.  Lymphadenopathy:     Cervical: No cervical adenopathy.  Skin:    General: Skin is warm and dry.     Comments: Scalp appears normal - no lesion or flaking noted  Neurological:  Mental Status: She is alert. Mental status is at baseline.     Motor: Atrophy (and paresis of LUE) present.  Psychiatric:        Attention and Perception: Attention and perception normal.     Wt Readings from Last 3 Encounters:  12/03/19 163 lb (73.9 kg)  09/29/19 162 lb 0.6 oz (73.5 kg)  09/04/19 162 lb (73.5 kg)    BP 128/62 (BP Location: Right Arm, Patient Position: Sitting, Cuff Size: Large)   Pulse 79   Temp 98 F (36.7 C) (Oral)   Ht '4\' 9"'  (1.448 m)   Wt 163 lb (73.9 kg)   SpO2 96%   BMI 35.27 kg/m   Assessment and Plan: 1. Type II diabetes mellitus with complication (HCC) Clinically stable by exam and report without s/s of hypoglycemia. DM complicated by lipids. Tolerating medications well without side effects or other concerns. - Comprehensive metabolic panel - Hemoglobin A1c - Microalbumin / creatinine urine ratio  2. Hyperlipidemia associated with type 2 diabetes mellitus ([place]) Tolerating statin medication without side effects at this time LDL is at goal of < 70 on current dose Continue same therapy without change at this time. - Lipid panel  3. Scalp itch Obvious cause is her hair product - recommend that she try a different brand Can use claritin prn if the itching is persistent   Partially dictated using Editor, commissioning. Any errors are unintentional.  Halina Maidens, MD [facility]  12/03/2019

## 2019-12-03 NOTE — Telephone Encounter (Signed)
Requested Prescriptions  Pending Prescriptions Disp Refills  . Accu-Chek Softclix Lancets lancets [Pharmacy Med Name: SOFTCLIX LANCETS    MIS] 100 each 4    Sig: USE TO CHECK GLUCOSE UP TO 4 TIMES DAILY     Endocrinology: Diabetes - Testing Supplies Passed - 12/03/2019 11:09 AM      Passed - Valid encounter within last 12 months    Recent Outpatient Visits          Today Type II diabetes mellitus with complication [facility])   [facility] Reubin Milan, MD   3 months ago Supraspinatus tendinitis, right   [facility] Duanne Limerick, MD   6 months ago Primary osteoarthritis of right hand   [facility] Reubin Milan, MD   10 months ago Annual physical exam   [facility] Reubin Milan, MD   1 year ago Uncontrolled type 2 diabetes mellitus without complication, without long-term current use of insulin [facility])   [facility] Reubin Milan, MD      Future Appointments            In 4 days McGowan, Elana Alm [facility] [place]   In 4 months Judithann Freeze Nyoka Cowden, MD [facility], [facility]

## 2019-12-04 ENCOUNTER — Other Ambulatory Visit: Payer: Self-pay | Admitting: Internal Medicine

## 2019-12-04 ENCOUNTER — Other Ambulatory Visit: Payer: Self-pay

## 2019-12-04 ENCOUNTER — Telehealth: Payer: Self-pay | Admitting: Internal Medicine

## 2019-12-04 ENCOUNTER — Other Ambulatory Visit: Payer: Self-pay | Admitting: *Deleted

## 2019-12-04 DIAGNOSIS — Z87448 Personal history of other diseases of urinary system: Secondary | ICD-10-CM

## 2019-12-04 LAB — COMPREHENSIVE METABOLIC PANEL
ALT: 13 IU/L (ref 0–32)
AST: 24 IU/L (ref 0–40)
Albumin/Globulin Ratio: 1.5 (ref 1.2–2.2)
Albumin: 4.4 g/dL (ref 3.7–4.7)
Alkaline Phosphatase: 51 IU/L (ref 48–121)
BUN/Creatinine Ratio: 15 (ref 12–28)
BUN: 11 mg/dL (ref 8–27)
Bilirubin Total: 0.3 mg/dL (ref 0.0–1.2)
CO2: 29 mmol/L (ref 20–29)
Calcium: 10.4 mg/dL — ABNORMAL HIGH (ref 8.7–10.3)
Chloride: 99 mmol/L (ref 96–106)
Creatinine, Ser: 0.74 mg/dL (ref 0.57–1.00)
GFR calc Af Amer: 91 mL/min/{1.73_m2} (ref >59)
GFR calc non Af Amer: 79 mL/min/{1.73_m2} (ref >59)
Globulin, Total: 2.9 g/dL (ref 1.5–4.5)
Glucose: 94 mg/dL (ref 65–99)
Potassium: 4.5 mmol/L (ref 3.5–5.2)
Sodium: 142 mmol/L (ref 134–144)
Total Protein: 7.3 g/dL (ref 6.0–8.5)

## 2019-12-04 LAB — HEMOGLOBIN A1C
Est. average glucose Bld gHb Est-mCnc: 154 mg/dL
Hgb A1c MFr Bld: 7 % — ABNORMAL HIGH (ref 4.8–5.6)

## 2019-12-04 LAB — LIPID PANEL
Chol/HDL Ratio: 2.7 ratio (ref 0.0–4.4)
Cholesterol, Total: 171 mg/dL (ref 100–199)
HDL: 63 mg/dL (ref >39)
LDL Chol Calc (NIH): 85 mg/dL (ref 0–99)
Triglycerides: 135 mg/dL (ref 0–149)
VLDL Cholesterol Cal: 23 mg/dL (ref 5–40)

## 2019-12-04 LAB — MICROALBUMIN / CREATININE URINE RATIO
Creatinine, Urine: 49 mg/dL
Microalb/Creat Ratio: 55 mg/g creat — ABNORMAL HIGH (ref 0–29)
Microalbumin, Urine: 26.8 ug/mL

## 2019-12-04 MED ORDER — GLUCOSE BLOOD VI STRP: ORAL_STRIP | 3 refills | Status: DC

## 2019-12-04 NOTE — Telephone Encounter (Signed)
Pt states medication is currently at pharmacy and it is to expensive . She would like another option regarding medication . She currently is out of meds . Please advise

## 2019-12-04 NOTE — Telephone Encounter (Signed)
I will call pharmacy once they open because patient should have refills on test strips

## 2019-12-04 NOTE — Telephone Encounter (Signed)
Patient requesting glucose blood test strip, advised please allow 48 to 72 hour turn around time, patient states she's out and would like rx sent in today and a follow up call when completed.  [facility] [address] [place] - [address] Phone:  [phone]  Fax:  [phone]

## 2019-12-04 NOTE — Telephone Encounter (Signed)
Requested medication (s) are due for refill today: no  Requested medication (s) are on the active medication list: yes  Last refill:  10/05/2019  Future visit scheduled: yes  Notes to clinic:  patient has been testing 4 times a day and also has been having some error message. Pharmacy states that insurance want cover until the 20th  Please advise  Requested Prescriptions  Pending Prescriptions Disp Refills   glucose blood test strip 400 each 3    Sig: USE 1 STRIP TO CHECK GLUCOSE UP TO THREE TIMES DAILY      Endocrinology: Diabetes - Testing Supplies Passed - 12/04/2019  8:28 AM      Passed - Valid encounter within last 12 months    Recent Outpatient Visits           Yesterday Type II diabetes mellitus with complication [facility])   [facility] Reubin Milan, MD   3 months ago Supraspinatus tendinitis, right   [facility] Duanne Limerick, MD   6 months ago Primary osteoarthritis of right hand   [facility] Reubin Milan, MD   10 months ago Annual physical exam   [facility] Reubin Milan, MD   1 year ago Uncontrolled type 2 diabetes mellitus without complication, without long-term current use of insulin [facility])   [facility] Reubin Milan, MD       Future Appointments             In 3 days McGowan, Elana Alm  [facility] [place]   In 4 months Judithann Alanis Nyoka Cowden, MD [facility], [facility]

## 2019-12-06 NOTE — Progress Notes (Signed)
12/07/2019 11:52 AM   [name] 1946/01/19 [id number]  Referring provider: [name], MD [address] [place],  [place] [address]  Chief Complaint  Patient presents with  . Follow-up    1 year follow-up, UA and sympton check    HPI: Patient is 74 year old female with a history of recurrent UTI's and a history of hematuria who presents today for follow up.  History of rUTI's Risk factors: age, vaginal atrophy and incontinence.  No UTI's since last year.   History of hematuria (high risk) Non-smoker.  CTU in 07/2017 noted the adrenal glands and kidneys are unremarkable. No renal, ureteral or bladder calculi or mass. The delayed images do not demonstrate any significant collecting system abnormalities and both ureters appear normal other than being deviated laterally in the pelvis by the enlarged fibroid uterus. No bladder mass or asymmetric bladder wall thickening.  Cystoscopy in 07/2017 with Dr. Matilde Sprang - NED.  She denies any gross hematuria.  UA today is negative for micro heme  Frequency She continues with the device called Attain for her bladder control.  She is not using it as much as she was in the past.   She is having worsening frequency and urge incontinence.  She is interested in other treatments.  Patient denies any modifying or aggravating factors.  Patient denies any gross hematuria, dysuria or suprapubic/flank pain.  Patient denies any fevers, chills, nausea or vomiting.    PMH: Past Medical History:  Diagnosis Date  . Absence of bladder continence 12/15/2014  . Blood pressure elevated without history of HTN 12/15/2014  . Diabetes mellitus without complication ([place])   . Hyperlipidemia   . Osteoporosis   . Overactive bladder   . PMB (postmenopausal bleeding) 11/01/2017  . 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 CESAREAN SECTION  1975  . COLONOSCOPY  2010    normal    Home Medications:  Allergies as of 12/07/2019      Reactions   Nitrofurantoin Nausea Only   Iodinated Diagnostic Agents Hives, Cough   Patient developed a hive on her left lip after injection as well as some coughing.       Medication List       Accurate as of December 07, 2019 11:52 AM. If you have any questions, ask your nurse or doctor.        STOP taking these medications   blood glucose meter kit and supplies Kit Stopped by: Dimples Probus, PA-C     TAKE these medications   Accu-Chek Softclix Lancets lancets USE TO CHECK GLUCOSE UP TO 4 TIMES DAILY   alendronate 70 MG tablet Commonly known as: FOSAMAX TAKE 1 TABLET BY MOUTH ONCE A WEEK TAKE  WITH  A  FULL  GLASS  OF  WATER  ON  AN  EMPTY  STOMACH   aspirin 81 MG chewable tablet Chew 1 tablet by mouth daily.   atorvastatin 10 MG tablet Commonly known as: LIPITOR Take 1 tablet by mouth once daily   baclofen 10 MG tablet Commonly known as: LIORESAL Take 1 tablet (10 mg total) by mouth 2 (two) times daily.   Calcium 500 + D 500-125 MG-UNIT Tabs Generic drug: Calcium Carbonate-Vitamin D Take 1 tablet by mouth daily.   glucose blood test strip USE 1 STRIP TO CHECK GLUCOSE UP TO FOUR TIMES DAILY   lisinopril 2.5 MG tablet Commonly  known as: Zestril Take 1 tablet (2.5 mg total) by mouth daily. Started by: Halina Maidens, MD   metFORMIN 500 MG 24 hr tablet Commonly known as: GLUCOPHAGE-XR TAKE 1 TABLET BY MOUTH WITH BREAKFAST   mirabegron ER 25 MG Tb24 tablet Commonly known as: Myrbetriq Take 1 tablet (25 mg total) by mouth daily. Started by: Zara Council, PA-C   MULTIVITAMIN ADULT PO Take by mouth.       Allergies:  Allergies  Allergen Reactions  . Nitrofurantoin Nausea Only  . Iodinated Diagnostic Agents Hives and Cough    Patient developed a hive on her left lip after injection as well as some coughing.     Family History: Family History  Problem Relation Age of Onset  . Diabetes  Mother   . Thyroid disease Mother   . Breast cancer Maternal Aunt   . Breast cancer Cousin        mat cousin  . Prostate cancer Brother   . Kidney failure Brother   . Kidney cancer Son   . Bladder Cancer Neg Hx     Social History:  reports that she has never smoked. She has never used smokeless tobacco. She reports that she does not drink alcohol and does not use drugs.  ROS: For pertinent review of systems please refer to history of present illness  Physical Exam: BP 134/68   Pulse 78   Wt 164 lb (74.4 kg)   BMI 35.49 kg/m   Constitutional:  Well nourished. Alert and oriented, No acute distress. HEENT: [place] AT, mask in place.  Trachea midline Cardiovascular: No clubbing, cyanosis, or edema. Respiratory: Normal respiratory effort, no increased work of breathing. Neurologic: Grossly intact, no focal deficits, moving all 4 extremities. In wheelchair Psychiatric: Normal mood and affect.   Laboratory Data: Lab Results  Component Value Date   WBC 5.9 01/16/2019   HGB 12.1 01/16/2019   HCT 38.9 01/16/2019   MCV 74 (L) 01/16/2019   PLT 275 01/16/2019    Lab Results  Component Value Date   CREATININE 0.74 12/03/2019    Lab Results  Component Value Date   HGBA1C 7.0 (H) 12/03/2019    Lab Results  Component Value Date   TSH 3.740 01/16/2019       Component Value Date/Time   CHOL 171 12/03/2019 0908   HDL 63 12/03/2019 0908   CHOLHDL 2.7 12/03/2019 0908   LDLCALC 85 12/03/2019 0908    Lab Results  Component Value Date   AST 24 12/03/2019   Lab Results  Component Value Date   ALT 13 12/03/2019    Urinalysis Component     Latest Ref Rng & Units 12/07/2019          Color, Urine     YELLOW YELLOW  Appearance     CLEAR CLEAR  Specific Gravity, Urine     1.005 - 1.030 1.015  pH     5.0 - 8.0 >9.0 (H)  Glucose, UA     NEGATIVE mg/dL NEGATIVE  Hgb urine dipstick     NEGATIVE NEGATIVE  Bilirubin Urine     NEGATIVE NEGATIVE  Ketones, ur     NEGATIVE  mg/dL NEGATIVE  Protein     NEGATIVE mg/dL NEGATIVE  Nitrite     NEGATIVE NEGATIVE  Leukocytes,Ua     NEGATIVE NEGATIVE  Squamous Epithelial / LPF     0 - 5 0-5  WBC, UA     0 - 5 WBC/hpf NONE SEEN  RBC /  HPF     0 - 5 RBC/hpf 0-5  Bacteria, UA     NONE SEEN NONE SEEN  Urine-Other      LESS THAN 10 mL OF URINE SUBMITTED   I have reviewed the labs.  Assessment & Plan:    1. History of rUTI's Asymptomatic   2. History of hematuria (high risk) Hematuria work up completed in 07/2017 - findings positive for fibroids No report of gross hematuria  UA today is negative for hematuria RTC in one year for UA - patient to report any gross hematuria in the interim    3. Frequency Not using the Attain bladder control device as consistently as before Will have a trial of Myrbetriq 25 mg daily, # 28 samples given RTC in 3 weeks for OAB questionnaire and PVR     Return in about 3 weeks (around 12/28/2019) for PVR and OAB questionnaire.  These notes generated with voice recognition software. I apologize for typographical errors.  Zara Council, PA-C  [facility] [address]  [place], [place] [address] [phone]

## 2019-12-07 ENCOUNTER — Other Ambulatory Visit: Payer: Self-pay | Admitting: Internal Medicine

## 2019-12-07 ENCOUNTER — Encounter: Payer: Self-pay | Admitting: Internal Medicine

## 2019-12-07 ENCOUNTER — Other Ambulatory Visit
Admission: RE | Admit: 2019-12-07 | Discharge: 2019-12-07 | Disposition: A | Discharge status: Home / Self Care | Payer: Medicare Other | Source: Ambulatory Visit | Attending: Urology | Admitting: Urology

## 2019-12-07 ENCOUNTER — Other Ambulatory Visit: Payer: Self-pay

## 2019-12-07 ENCOUNTER — Ambulatory Visit: Payer: Medicare Other | Admitting: Urology

## 2019-12-07 ENCOUNTER — Encounter: Payer: Self-pay | Admitting: Urology

## 2019-12-07 VITALS — BP 134/68 | HR 78 | Wt 164.0 lb

## 2019-12-07 DIAGNOSIS — Z87448 Personal history of other diseases of urinary system: Secondary | ICD-10-CM | POA: Diagnosis not present

## 2019-12-07 DIAGNOSIS — R350 Frequency of micturition: Secondary | ICD-10-CM

## 2019-12-07 DIAGNOSIS — E1129 Type 2 diabetes mellitus with other diabetic kidney complication: Secondary | ICD-10-CM | POA: Insufficient documentation

## 2019-12-07 DIAGNOSIS — E118 Type 2 diabetes mellitus with unspecified complications: Secondary | ICD-10-CM

## 2019-12-07 DIAGNOSIS — Z87440 Personal history of urinary (tract) infections: Secondary | ICD-10-CM

## 2019-12-07 LAB — URINALYSIS, COMPLETE (UACMP) WITH MICROSCOPIC
Bacteria, UA: NONE SEEN
Bilirubin Urine: NEGATIVE
Glucose, UA: NEGATIVE mg/dL
Hgb urine dipstick: NEGATIVE
Ketones, ur: NEGATIVE mg/dL
Leukocytes,Ua: NEGATIVE
Nitrite: NEGATIVE
Protein, ur: NEGATIVE mg/dL
Specific Gravity, Urine: 1.015 (ref 1.005–1.030)
WBC, UA: NONE SEEN WBC/hpf (ref 0–5)
pH: >9 — ABNORMAL HIGH (ref 5.0–8.0)

## 2019-12-07 MED ORDER — LISINOPRIL 2.5 MG PO TABS: 2.5000 mg | ORAL_TABLET | Freq: Every day | ORAL | 3 refills | Status: DC

## 2019-12-07 MED ORDER — MIRABEGRON ER 25 MG PO TB24: 25.0000 mg | ORAL_TABLET | Freq: Every day | ORAL | 0 refills | Status: DC

## 2019-12-07 NOTE — Telephone Encounter (Signed)
Called pt she said that she wasn't able to pick her test strips up until tomorrow 12/08/2019 that she will pick them up on 12/08/2019.  KP

## 2019-12-07 NOTE — Patient Instructions (Signed)
Mirabegron extended-release tablets What is this medicine? MIRABEGRON (MIR a BEG ron) is used to treat overactive bladder. This medicine reduces the amount of bathroom visits. It may also help to control wetting accidents. It may be used alone, but sometimes may be given with other treatments. This medicine may be used for other purposes; ask your health care provider or pharmacist if you have questions. COMMON BRAND NAME(S): Myrbetriq What should I tell my health care provider before I take this medicine? They need to know if you have any of these conditions:  high blood pressure  kidney disease  liver disease  problems urinating  prostate disease  an unusual or allergic reaction to mirabegron, other medicines, foods, dyes, or preservatives  pregnant or trying to get pregnant  breast-feeding How should I use this medicine? Take this medicine by mouth with a glass of water. Follow the directions on the prescription label. Do not cut, crush or chew this medicine. You can take it with or without food. If it upsets your stomach, take it with food. Take your medicine at regular intervals. Do not take it more often than directed. Do not stop taking except on your doctor's advice. Talk to your pediatrician regarding the use of this medicine in children. Special care may be needed. Overdosage: If you think you have taken too much of this medicine contact a poison control center or emergency room at once. NOTE: This medicine is only for you. Do not share this medicine with others. What if I miss a dose? If you miss a dose, take it as soon as you can. If it is almost time for your next dose, take only that dose. Do not take double or extra doses. What may interact with this medicine?  codeine  desipramine  digoxin  flecainide  MAOIs like Carbex, Eldepryl, Marplan, Nardil, and Parnate  methadone  metoprolol  pimozide  propafenone  thioridazine  warfarin This list may not  describe all possible interactions. Give your health care provider a list of all the medicines, herbs, non-prescription drugs, or dietary supplements you use. Also tell them if you smoke, drink alcohol, or use illegal drugs. Some items may interact with your medicine. What should I watch for while using this medicine? Visit your doctor or health care professional for regular checks on your progress. Check your blood pressure as directed. Ask your doctor or health care professional what your blood pressure should be and when you should contact him or her. You may need to limit your intake of tea, coffee, caffeinated sodas, or alcohol. These drinks may make your symptoms worse. What side effects may I notice from receiving this medicine? Side effects that you should report to your doctor or health care professional as soon as possible:  allergic reactions like skin rash, itching or hives, swelling of the face, lips, or tongue  high blood pressure  fast, irregular heartbeat  redness, blistering, peeling or loosening of the skin, including inside the mouth  signs of infection like fever or chills; pain or difficulty passing urine  trouble passing urine or change in the amount of urine Side effects that usually do not require medical attention (report to your doctor or health care professional if they continue or are bothersome):  constipation  dry mouth  headache  runny nose  stomach upset This list may not describe all possible side effects. Call your doctor for medical advice about side effects. You may report side effects to FDA at 1-800-FDA-1088. Where should   I keep my medicine? Keep out of the reach of children. Store at room temperature between 15 and 30 degrees C (59 and 86 degrees F). Throw away any unused medicine after the expiration date. NOTE: This sheet is a summary. It may not cover all possible information. If you have questions about this medicine, talk to your doctor,  pharmacist, or health care provider.  2020 Elsevier/Gold Standard (2016-09-27 11:33:21)  

## 2019-12-07 NOTE — Progress Notes (Signed)
Called pt about labs. Protein in urine which is the first sign that diabetes is damaging her kidneys. Needs to take medication lisinopril which helps protect kidneys. Pt verbalized understanding. Pt wants lisinopril 2.5mg  sent to [facility] in [place].  KP  KP

## 2019-12-08 NOTE — Telephone Encounter (Signed)
Called pt could not leave VM the memory was full.  KP

## 2019-12-08 NOTE — Telephone Encounter (Signed)
Patient calling back to speak with Dr. Judithann Loree CMA. She would not disclose additional information other than it was regarding her medication.

## 2019-12-15 ENCOUNTER — Other Ambulatory Visit: Payer: Self-pay

## 2019-12-15 ENCOUNTER — Ambulatory Visit
Admission: EM | Admit: 2019-12-15 | Discharge: 2019-12-15 | Disposition: A | Discharge status: Home / Self Care | Payer: Medicare Other | Attending: Emergency Medicine | Admitting: Emergency Medicine

## 2019-12-15 DIAGNOSIS — H00015 Hordeolum externum left lower eyelid: Secondary | ICD-10-CM | POA: Diagnosis not present

## 2019-12-15 MED ORDER — ERYTHROMYCIN 5 MG/GM OP OINT: TOPICAL_OINTMENT | OPHTHALMIC | 0 refills | Status: DC

## 2019-12-15 NOTE — ED Provider Notes (Signed)
HPI  SUBJECTIVE:  Sharon York is a 74 y.o. female who presents with 1 week of a stye left lower eyelid.  She states that it burst yesterday while rubbing her eye.  She denies purulent drainage.  She states that the left lower eyelid swelling is going down.  It does not hurt.  No visual changes.  No eye pain, periorbital erythema, edema.  No pain with extraocular movements.  She wears glasses, she does not wear contacts.  She has tried rubbing her eye and is using an unknown eye lubricant drop with improvement of symptoms.  No aggravating factors.  She wants to be evaluated so that the stye does not come back.  She has a past medical history of hypertension.  No history of glaucoma, MRSA.  PMD: Dr. Judithann Toole.  Ophthalmology: Dr. Beverely Pace.    Past Medical History:  Diagnosis Date  . Absence of bladder continence 12/15/2014  . Blood pressure elevated without history of HTN 12/15/2014  . Diabetes mellitus without complication ([facility])   . Hyperlipidemia   . Osteoporosis   . Overactive bladder   . PMB (postmenopausal bleeding) 11/01/2017  . 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 CESAREAN SECTION  1975  . COLONOSCOPY  2010   normal    Family History  Problem Relation Age of Onset  . Diabetes Mother   . Thyroid disease Mother   . Breast cancer Maternal Aunt   . Breast cancer Cousin        mat cousin  . Prostate cancer Brother   . Kidney failure Brother   . Kidney cancer Son   . Bladder Cancer Neg Hx     Social History   Tobacco Use  . Smoking status: Never Smoker  . Smokeless tobacco: Never Used  . Tobacco comment: smoking cessation materials not required  Vaping Use  . Vaping Use: Never used  Substance Use Topics  . Alcohol use: No    Alcohol/week: 0.0 standard drinks  . Drug use: Never    No current facility-administered medications for this encounter.  Current Outpatient Medications:  .  Accu-Chek Softclix  Lancets lancets, USE TO CHECK GLUCOSE UP TO 4 TIMES DAILY, Disp: 100 each, Rfl: 4 .  alendronate (FOSAMAX) 70 MG tablet, TAKE 1 TABLET BY MOUTH ONCE A WEEK TAKE  WITH  A  FULL  GLASS  OF  WATER  ON  AN  EMPTY  STOMACH, Disp: 12 tablet, Rfl: 3 .  aspirin 81 MG chewable tablet, Chew 1 tablet by mouth daily., Disp: , Rfl:  .  atorvastatin (LIPITOR) 10 MG tablet, Take 1 tablet by mouth once daily, Disp: 90 tablet, Rfl: 3 .  baclofen (LIORESAL) 10 MG tablet, Take 1 tablet (10 mg total) by mouth 2 (two) times daily., Disp: 60 tablet, Rfl: 12 .  Calcium Carbonate-Vitamin D (CALCIUM 500 + D) 500-125 MG-UNIT TABS, Take 1 tablet by mouth daily. , Disp: , Rfl:  .  glucose blood test strip, USE 1 STRIP TO CHECK GLUCOSE UP TO FOUR TIMES DAILY, Disp: 400 each, Rfl: 3 .  lisinopril (ZESTRIL) 2.5 MG tablet, Take 1 tablet (2.5 mg total) by mouth daily., Disp: 90 tablet, Rfl: 3 .  metFORMIN (GLUCOPHAGE-XR) 500 MG 24 hr tablet, TAKE 1 TABLET BY MOUTH WITH BREAKFAST, Disp: 90 tablet, Rfl: 3 .  mirabegron ER (MYRBETRIQ) 25 MG TB24 tablet, Take 1 tablet (25 mg total) by mouth daily.,  Disp: 28 tablet, Rfl: 0 .  Multiple Vitamins-Minerals (MULTIVITAMIN ADULT PO), Take by mouth., Disp: , Rfl:  .  erythromycin ophthalmic ointment, 1 cm ribbon to affected eyelid qid x 10 days, Disp: 5 g, Rfl: 0  Allergies  Allergen Reactions  . Nitrofurantoin Nausea Only  . Iodinated Diagnostic Agents Hives and Cough    Patient developed a hive on her left lip after injection as well as some coughing.      ROS  As noted in HPI.   Physical Exam  BP (!) 128/58 (BP Location: Right Arm)   Pulse 71   Temp 98.2 F (36.8 C) (Oral)   Resp 18   Wt 70.3 kg   SpO2 96%   BMI 33.54 kg/m   Constitutional: Well developed, well nourished, no acute distress Eyes:  EOMI, conjunctiva normal bilaterally PERRLA.  No direct or consensual photophobia.  No pain with EOMs.  Healing stye left lower eyelid see picture Corrected visual  acuity: Left: 20/25 Right: 20/25      HENT: Normocephalic, atraumatic,mucus membranes moist Respiratory: Normal inspiratory effort Cardiovascular: Normal rate GI: nondistended skin: No rash, skin intact Musculoskeletal: no deformities Neurologic: Alert & oriented x 3, no focal neuro deficits Psychiatric: Speech and behavior appropriate   ED Course   Medications - No data to display  Orders Placed This Encounter  Procedures  . Visual acuity screening    L 20/25 R 20/25    Standing Status:   Standing    Number of Occurrences:   1    No results found for this or any previous visit (from the past 24 hour(s)). No results found.  ED Clinical Impression  1. Hordeolum externum of left lower eyelid      ED Assessment/Plan  Patient with a healing stye.  No evidence of periorbital cellulitis.  Discussed lid hygiene, will send home with erythromycin ointment although discussed with her that she most likely does not need this.  She can use this the next time the stye comes out.  She will follow-up with her ophthalmologist as needed.   Meds ordered this encounter  Medications  . erythromycin ophthalmic ointment    Sig: 1 cm ribbon to affected eyelid qid x 10 days    Dispense:  5 g    Refill:  0    *This clinic note was created using Scientist, clinical (histocompatibility and immunogenetics). Therefore, there may be occasional mistakes despite careful proofreading.   ?    Domenick Gong, MD 12/15/19 1319

## 2019-12-15 NOTE — Discharge Instructions (Addendum)
Start doing warm compresses 4 times a day. You may get some baby shampoo, dilute this with warm water, and gently wipe your upper and lower eyelid while you are taking a shower. This will help prevent the recurrence of any styes or chalazions. Follow up with ophthalmologist if this does not get better within several days. Try not to rub your eyes. You may use Systane as often as you want for comfort. Return immediately to the ER for fever above 100.4, if you have pain moving your eyes, any visual changes, nausea, vomiting, headaches, a rash around your eye, or any other concerns. ° °Go to www.goodrx.com to look up your medications. This will give you a list of where you can find your prescriptions at the most affordable prices. Or ask the pharmacist what the cash price is, or if they have any other discount programs available to help make your medication more affordable. This can be less expensive than what you would pay with insurance.   °

## 2019-12-15 NOTE — ED Triage Notes (Signed)
Patient states that she did have a sty on her left eye yesterday that did release yesterday. Patient states that the eye is better today and is not hurting or itching. She just wants to have the area looked at to make sure it is okay.

## 2019-12-27 NOTE — Progress Notes (Addendum)
**Note Sharon-Identified via Obfuscation** 12/28/2019 3:05 PM   Sharon York [birth year] [id number]  Referring provider: Reubin Milan, MD [address] [place],  [place] [address]  Chief Complaint  Patient presents with  . Follow-up    3 week follow-up    HPI: Patient is 74 year old female with rUTI's, hematuria and frequency who presents today for follow up after a trial of Myrbetriq.    History of rUTI's Risk factors: age, vaginal atrophy and incontinence.  No UTI's since last year.   History of hematuria (high risk) Non-smoker.  CTU in 07/2017 noted the adrenal glands and kidneys are unremarkable. No renal, ureteral or bladder calculi or mass. The delayed images do not demonstrate any significant collecting system abnormalities and both ureters appear normal other than being deviated laterally in the pelvis by the enlarged fibroid uterus. No bladder mass or asymmetric bladder wall thickening.  Cystoscopy in 07/2017 with Dr. Sherron Monday - NED.  She denies any gross hematuria.  UA 11/2019 was negative for micro heme  Frequency The patient is  experiencing urgency x 4-7, frequency x 4-7, not restricting fluids to avoid visits to the restroom, is engaging in toilet mapping, incontinence x 4-7 and nocturia x 0-3.   Her BP is 134/80.   Her PVR is 65 mL.    She found the Myrbetriq 25 mg daily not helpful.   She drinks mostly water and cranberry juice.  Patient denies any modifying or aggravating factors.  Patient denies any gross hematuria, dysuria or suprapubic/flank pain.  Patient denies any fevers, chills, nausea or vomiting.   She is going through 5 pads daily.   The most bothersome issue is having to stop what she is doing to use the restroom.   PMH: Past Medical History:  Diagnosis Date  . Absence of bladder continence 12/15/2014  . Blood pressure elevated without history of HTN 12/15/2014  . Diabetes mellitus without complication (HCC)   . Hyperlipidemia   . Osteoporosis   . Overactive bladder   . PMB  (postmenopausal bleeding) 11/01/2017  . 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 CESAREAN SECTION  1975  . COLONOSCOPY  2010   normal    Home Medications:  Allergies as of 12/28/2019       Reactions   Nitrofurantoin Nausea Only   Iodinated Diagnostic Agents Hives, Cough   Patient developed a hive on her left lip after injection as well as some coughing.         Medication List        Accurate as of December 28, 2019  3:05 PM. If you have any questions, ask your nurse or doctor.          Accu-Chek Softclix Lancets lancets USE TO CHECK GLUCOSE UP TO 4 TIMES DAILY   alendronate 70 MG tablet Commonly known as: FOSAMAX TAKE 1 TABLET BY MOUTH ONCE A WEEK TAKE  WITH  A  FULL  GLASS  OF  WATER  ON  AN  EMPTY  STOMACH   aspirin 81 MG chewable tablet Chew 1 tablet by mouth daily.   atorvastatin 10 MG tablet Commonly known as: LIPITOR Take 1 tablet by mouth once daily   baclofen 10 MG tablet Commonly known as: LIORESAL Take 1 tablet (10 mg total) by mouth 2 (two) times daily.   Calcium 500 + D 500-125 MG-UNIT Tabs Generic drug: Calcium Carbonate-Vitamin D Take 1 tablet  by mouth daily.   erythromycin ophthalmic ointment 1 cm ribbon to affected eyelid qid x 10 days   glucose blood test strip USE 1 STRIP TO CHECK GLUCOSE UP TO FOUR TIMES DAILY   lisinopril 2.5 MG tablet Commonly known as: Zestril Take 1 tablet (2.5 mg total) by mouth daily.   metFORMIN 500 MG 24 hr tablet Commonly known as: GLUCOPHAGE-XR TAKE 1 TABLET BY MOUTH WITH BREAKFAST   mirabegron ER 25 MG Tb24 tablet Commonly known as: Myrbetriq Take 1 tablet (25 mg total) by mouth daily.   MULTIVITAMIN ADULT PO Take by mouth.        Allergies:  Allergies  Allergen Reactions  . Nitrofurantoin Nausea Only  . Iodinated Diagnostic Agents Hives and Cough    Patient developed a hive on her left lip after injection as well as  some coughing.     Family History: Family History  Problem Relation Age of Onset  . Diabetes Mother   . Thyroid disease Mother   . Breast cancer Maternal Aunt   . Breast cancer Cousin        mat cousin  . Prostate cancer Brother   . Kidney failure Brother   . Kidney cancer Son   . Bladder Cancer Neg Hx     Social History:  reports that she has never smoked. She has never used smokeless tobacco. She reports that she does not drink alcohol and does not use drugs.  ROS: For pertinent review of systems please refer to history of present illness  Physical Exam: BP 134/80   Pulse 79   Ht 4\' 11"  (1.499 m)   Wt 163 lb (73.9 kg)   BMI 32.92 kg/m   Constitutional:  Well nourished. Alert and oriented, No acute distress. HEENT: [place] AT, mask in place.  Trachea midline Cardiovascular: No clubbing, cyanosis, or edema. Respiratory: Normal respiratory effort, no increased work of breathing. Neurologic: Grossly intact, no focal deficits, moving all 4 extremities. Psychiatric: Normal mood and affect.   Laboratory Data: Lab Results  Component Value Date   WBC 5.9 01/16/2019   HGB 12.1 01/16/2019   HCT 38.9 01/16/2019   MCV 74 (L) 01/16/2019   PLT 275 01/16/2019    Lab Results  Component Value Date   CREATININE 0.74 12/03/2019    Lab Results  Component Value Date   HGBA1C 7.0 (H) 12/03/2019    Lab Results  Component Value Date   TSH 3.740 01/16/2019       Component Value Date/Time   CHOL 171 12/03/2019 0908   HDL 63 12/03/2019 0908   CHOLHDL 2.7 12/03/2019 0908   LDLCALC 85 12/03/2019 0908    Lab Results  Component Value Date   AST 24 12/03/2019   Lab Results  Component Value Date   ALT 13 12/03/2019    Urinalysis Component     Latest Ref Rng & Units 12/07/2019          Color, Urine     YELLOW YELLOW  Appearance     CLEAR CLEAR  Specific Gravity, Urine     1.005 - 1.030 1.015  pH     5.0 - 8.0 >9.0 (H)  Glucose, UA     NEGATIVE mg/dL NEGATIVE  Hgb  urine dipstick     NEGATIVE NEGATIVE  Bilirubin Urine     NEGATIVE NEGATIVE  Ketones, ur     NEGATIVE mg/dL NEGATIVE  Protein     NEGATIVE mg/dL NEGATIVE  Nitrite     NEGATIVE  NEGATIVE  Leukocytes,Ua     NEGATIVE NEGATIVE  Squamous Epithelial / LPF     0 - 5 0-5  WBC, UA     0 - 5 WBC/hpf NONE SEEN  RBC / HPF     0 - 5 RBC/hpf 0-5  Bacteria, UA     NONE SEEN NONE SEEN  Urine-Other      LESS THAN 10 mL OF URINE SUBMITTED   I have reviewed the labs.  Pertinent Imaging Results for Sharon York, Sharon York (MRN [id number]) as of 12/28/2019 14:53  Ref. Range 12/28/2019 14:50  Scan Result Unknown 65 ml   Assessment & Plan:    1. Frequency Failed OAB medication Continue timed voids and avoiding bladder irritants  Will advance to the next step in the algorithm explained the PTNS provides treatment by indirectly providing electrical stimulation to the nerves responsible for bladder and pelvic floor function - a needle electrode generates an adjustable electrical pulse that travels to the sacral plexus via the tibial nerve which is located in the ankle, among other functions, the sacral nerve plexus regulates bladder and pelvic floor function - treatment protocol requires once-a-week treatments for 12 weeks, 30 minutes per session and many patients begin to see improvements by the 6th treatment. Patients who respond to treatment may require occasional treatments (~ once every 3 weeks) to sustain improvements. PTNS is a low-risk procedure. The most common side-effects with PTNS treatment are temporary and minor, resulting from the placement of the needle electrode. They include minor bleeding, mild pain and skin inflammation and patients have seen up to an 80% success rate with this form of treatment RTC for PTNS    2. History of rUTI's Asymptomatic   3. History of hematuria (high risk) Hematuria work up completed in 07/2017 - findings positive for fibroids No report of gross hematuria  UA  today is negative for hematuria RTC in one year for UA - patient to report any gross hematuria in the interim      Return for she would like to be scheduled for PTNS, but we need to check with her insurance first .  These notes generated with voice recognition software. I apologize for typographical errors.  Michiel Cowboy, PA-C  [facility] [address]  [address] [place], [place] [address] [phone]  I spent 15 minutes on the day of the encounter to include pre-visit record review, face-to-face time with the patient, and post-visit ordering of tests.

## 2019-12-28 ENCOUNTER — Encounter: Payer: Self-pay | Admitting: Urology

## 2019-12-28 ENCOUNTER — Ambulatory Visit (INDEPENDENT_AMBULATORY_CARE_PROVIDER_SITE_OTHER): Payer: Medicare Other | Admitting: Urology

## 2019-12-28 ENCOUNTER — Other Ambulatory Visit: Payer: Self-pay

## 2019-12-28 ENCOUNTER — Telehealth: Payer: Self-pay | Admitting: Urology

## 2019-12-28 VITALS — BP 134/80 | HR 79 | Ht 59.0 in | Wt 163.0 lb

## 2019-12-28 DIAGNOSIS — R350 Frequency of micturition: Secondary | ICD-10-CM | POA: Diagnosis not present

## 2019-12-28 LAB — BLADDER SCAN AMB NON-IMAGING: Scan Result: 65

## 2019-12-28 NOTE — Telephone Encounter (Signed)
Would you check with her insurance and see if it will cover PTNS for her?

## 2019-12-29 NOTE — Telephone Encounter (Signed)
No auth needed , scheduled for [employer]

## 2019-12-29 NOTE — Telephone Encounter (Signed)
Yes no prior auth needed.

## 2019-12-29 NOTE — Telephone Encounter (Signed)
Would you get her scheduled for her?   She goes to [place].

## 2020-01-08 ENCOUNTER — Telehealth: Payer: Self-pay

## 2020-01-08 ENCOUNTER — Other Ambulatory Visit: Payer: Self-pay

## 2020-01-08 DIAGNOSIS — E118 Type 2 diabetes mellitus with unspecified complications: Secondary | ICD-10-CM

## 2020-01-08 MED ORDER — POGO AUTOMATIC TEST CARTRIDGES VI TEST: 1.0000 | Freq: Every day | 11 refills | Status: DC

## 2020-01-08 MED ORDER — POGO AUTOMATIC BLOOD GLUCOSE DEVI
1.0000 | Freq: Every day | 0 refills | Status: DC
Start: 2020-01-08 — End: 2020-05-02

## 2020-01-08 NOTE — Telephone Encounter (Signed)
Patient daughter wanted to know if we could try and send in the [facility] Automatic Glucose Device for the patient. Sent into her [facility] pharmacy to try to get covered by insurance since pt has difficulty pricking her finger and cannot use one of her hands.   CM

## 2020-01-12 ENCOUNTER — Telehealth: Payer: Self-pay | Admitting: Internal Medicine

## 2020-01-12 NOTE — Telephone Encounter (Signed)
See message from pt

## 2020-01-12 NOTE — Telephone Encounter (Signed)
Copied from CRM [id number]. Topic: General - Other >> Jan 12, 2020  2:15 PM Dalphine Handing A wrote: Patient called to give message to PCP that she is wanting a one step POGO meter so that she doesn't have to use both hands and she feels this will be easier for her. Best contact [phone]

## 2020-01-12 NOTE — Telephone Encounter (Signed)
Patient called and stated that the [facility] stated they don't carry the meter in store, it may have to be ordered online. Please contact patient and advise next steps.

## 2020-01-13 ENCOUNTER — Ambulatory Visit: Payer: Medicare Other

## 2020-01-19 ENCOUNTER — Encounter: Payer: Medicare Other | Admitting: Internal Medicine

## 2020-01-31 ENCOUNTER — Other Ambulatory Visit: Payer: Self-pay | Admitting: Internal Medicine

## 2020-01-31 DIAGNOSIS — I69354 Hemiplegia and hemiparesis following cerebral infarction affecting left non-dominant side: Secondary | ICD-10-CM

## 2020-01-31 NOTE — Progress Notes (Signed)
PTNS  Session # 1   Health & Social Factors: no change Caffeine: 0 Alcohol: 0 Daytime voids #per day: 3-4 Night-time voids #per night: 1 Urgency: mild Incontinence Episodes #per day: 1 Ankle used: right Treatment Setting: 7 Feeling/ Response: both Comments: pt tolerated well  Performed By: Mervin Hack CMA  Assistant:   Follow Up: 1 week

## 2020-01-31 NOTE — Telephone Encounter (Signed)
Requested medication (s) are due for refill today: yes  Requested medication (s) are on the active medication list: yes  Last refill:  01/16/19  Future visit scheduled: no  Notes to clinic:  med not delegated to NT to RF   Requested Prescriptions  Pending Prescriptions Disp Refills   baclofen (LIORESAL) 10 MG tablet [Pharmacy Med Name: Baclofen 10 MG Oral Tablet] 60 tablet 0    Sig: Take 1 tablet by mouth twice daily      Not Delegated - Analgesics:  Muscle Relaxants Failed - 01/31/2020  3:18 PM      Failed - This refill cannot be delegated      Passed - Valid encounter within last 6 months    Recent Outpatient Visits           1 month ago Type II diabetes mellitus with complication [facility])   [facility] [facility] Reubin Milan, MD   4 months ago Supraspinatus tendinitis, right   [facility] Duanne Limerick, MD   8 months ago Primary osteoarthritis of right hand   [facility] Reubin Milan, MD   1 year ago Annual physical exam   [facility] Reubin Milan, MD   1 year ago Uncontrolled type 2 diabetes mellitus without complication, without long-term current use of insulin [facility])   [place] [facility] Reubin Milan, MD       Future Appointments             Tomorrow Harle Battiest, PA-C [facility] [place]   In 1 week Hulan Fray [facility] [place]   In 3 weeks McGowan, Elana Alm [facility] [place]   In 4 weeks McGowan, Elana Alm [facility] [place]   In 1 month McGowan, Elana Alm [place] [facility] [place]

## 2020-02-01 ENCOUNTER — Encounter: Payer: Self-pay | Admitting: Urology

## 2020-02-01 ENCOUNTER — Other Ambulatory Visit: Payer: Self-pay

## 2020-02-01 ENCOUNTER — Ambulatory Visit (INDEPENDENT_AMBULATORY_CARE_PROVIDER_SITE_OTHER): Payer: Medicare Other | Admitting: Urology

## 2020-02-01 DIAGNOSIS — R350 Frequency of micturition: Secondary | ICD-10-CM | POA: Diagnosis not present

## 2020-02-03 DIAGNOSIS — H2513 Age-related nuclear cataract, bilateral: Secondary | ICD-10-CM | POA: Diagnosis not present

## 2020-02-03 DIAGNOSIS — E119 Type 2 diabetes mellitus without complications: Secondary | ICD-10-CM | POA: Diagnosis not present

## 2020-02-03 LAB — HM DIABETES EYE EXAM

## 2020-02-07 NOTE — Progress Notes (Signed)
PTNS  Session # 2  Health & Social Factors: no change Caffeine: 0 Alcohol: 0 Daytime voids #per day: 3-4 Night-time voids #per night: 1 Urgency: mild Incontinence Episodes #per day: 1 Ankle used: right Treatment Setting: 6 Feeling/ Response: both Comments: pt tolerated well  Performed By: Mervin Hack, CMA  Assistant:   Follow Up: 1 week

## 2020-02-08 ENCOUNTER — Ambulatory Visit (INDEPENDENT_AMBULATORY_CARE_PROVIDER_SITE_OTHER): Payer: Medicare Other | Admitting: Urology

## 2020-02-08 ENCOUNTER — Other Ambulatory Visit: Payer: Self-pay

## 2020-02-08 ENCOUNTER — Encounter: Payer: Self-pay | Admitting: Urology

## 2020-02-08 DIAGNOSIS — R350 Frequency of micturition: Secondary | ICD-10-CM | POA: Diagnosis not present

## 2020-02-09 ENCOUNTER — Encounter: Payer: Self-pay | Admitting: Internal Medicine

## 2020-02-11 ENCOUNTER — Other Ambulatory Visit: Payer: Self-pay

## 2020-02-11 ENCOUNTER — Other Ambulatory Visit: Payer: Self-pay | Admitting: Internal Medicine

## 2020-02-11 ENCOUNTER — Ambulatory Visit (INDEPENDENT_AMBULATORY_CARE_PROVIDER_SITE_OTHER): Payer: Medicare Other

## 2020-02-11 DIAGNOSIS — Z23 Encounter for immunization: Secondary | ICD-10-CM | POA: Diagnosis not present

## 2020-02-11 DIAGNOSIS — E118 Type 2 diabetes mellitus with unspecified complications: Secondary | ICD-10-CM

## 2020-02-11 MED ORDER — POGO AUTOMATIC TEST CARTRIDGES VI TEST: 1.0000 | Freq: Every day | 11 refills | Status: DC

## 2020-02-11 NOTE — Telephone Encounter (Signed)
Requested Prescriptions  Pending Prescriptions Disp Refills   Glucose Blood Automatic (POGO AUTOMATIC TEST CARTRIDGES) TEST 10 each 11    Sig: 1 each by In Vitro route daily.     Endocrinology: Diabetes - Testing Supplies Passed - 02/11/2020  4:38 PM      Passed - Valid encounter within last 12 months    Recent Outpatient Visits          2 months ago Type II diabetes mellitus with complication [facility])   [place] [facility] Reubin Milan, MD   5 months ago Supraspinatus tendinitis, right   [facility] Duanne Limerick, MD   8 months ago Primary osteoarthritis of right hand   [facility] Reubin Milan, MD   1 year ago Annual physical exam   [facility] Reubin Milan, MD   1 year ago Uncontrolled type 2 diabetes mellitus without complication, without long-term current use of insulin [facility])   [place] [facility] Reubin Milan, MD      Future Appointments            In 1 week McGowan, Elana Alm [place] [facility] [place]   In 2 weeks McGowan, Elana Alm [place] [facility] [place]   In 3 weeks McGowan, Elana Alm [place] [facility] [place]   In 1 month McGowan, Elana Alm [place] [facility] [place]   In 1 month McGowan, Elana Alm [facility] [facility] [place]           per pt. Request, sending this Rx to [facility] in [place], [place], due to not available at [facility], where it was previously sent.

## 2020-02-11 NOTE — Telephone Encounter (Signed)
Medication Refill - Medication: Glucose Blood Automatic (POGO AUTOMATIC TEST CARTRIDGES) TEST     Preferred Pharmacy (with phone number or street name):  [id number] - [place], [place] - [address] [place] [address] AT [facility] [address] [facility] [address] Phone:  [phone]  Fax:  [phone]       Agent: Please be advised that RX refills may take up to 3 business days. We ask that you follow-up with your pharmacy.

## 2020-02-11 NOTE — Telephone Encounter (Signed)
Phone call to pt. To inquire about request for refill on the blood glucose test cartridges.  Noted that this was ordered on 01/08/20, at [facility].  Pt. Stated that [facility] does not carry this product.  Is requesting to have it sent to [facility].  Advised pt. Will send to [facility] in [place], per her request.  Agreed with plan.

## 2020-02-15 ENCOUNTER — Other Ambulatory Visit: Payer: Self-pay

## 2020-02-15 ENCOUNTER — Encounter: Payer: Self-pay | Admitting: Urology

## 2020-02-15 ENCOUNTER — Ambulatory Visit (INDEPENDENT_AMBULATORY_CARE_PROVIDER_SITE_OTHER): Payer: Medicare Other | Admitting: Urology

## 2020-02-15 DIAGNOSIS — R350 Frequency of micturition: Secondary | ICD-10-CM | POA: Diagnosis not present

## 2020-02-15 NOTE — Progress Notes (Signed)
Patient ID: Sharon York, female   DOB: 04-Apr-1946, 74 y.o.   MRN: [id number] PTNS  Session # 3  Health & Social Factors: no change Caffeine: 0 Alcohol: 0 Daytime voids #per day: 3 Night-time voids #per night: 1 Urgency: mild Incontinence Episodes #per day: 1 Ankle used: right Treatment Setting: 7 Feeling/ Response: both Comments: pt tolerated well  Performed By: Mervin Hack, CMA  Assistant:   Follow Up: 1 week #4

## 2020-02-17 ENCOUNTER — Other Ambulatory Visit: Payer: Self-pay

## 2020-02-17 ENCOUNTER — Telehealth: Payer: Self-pay | Admitting: Internal Medicine

## 2020-02-17 NOTE — Telephone Encounter (Signed)
Pt called and is requesting to have a PA for her needles for her pogo meter. She states that the insurance will not cover it any longer. She states that she is not able to use the regular meter due to her not being able to use both hands and this easier for her. Please advise.

## 2020-02-17 NOTE — Telephone Encounter (Signed)
Called patient insurance and tried to initiate a PA. They told me that the patients insurance does not require a PA for Continuous Glucose Monitors. I explained that the patient needs a special monitor named POGO which she can use one hand to test her BS since her left arm and hand is not able to be used. They still said there is nothing they can do and that the patient may need to buy this out of pocket since its a special device.  Called and explained this to the patient. She said its $32 on the website a month so she will continue to buy them offline.  CM

## 2020-02-17 NOTE — Telephone Encounter (Signed)
Pt is checking these needles only has 5 left. Call Re [phone].

## 2020-02-18 NOTE — Telephone Encounter (Signed)
Pt states her insurance told her they have a meter similar to the POGO.  If the dr would agree, they could send to her. She aid the insurance was going to call this am and tell the dr which one it is.  But she does not know. She is going to call them back and find out the name and call dr back.

## 2020-02-21 NOTE — Progress Notes (Signed)
PTNS  Session # 4  Health & Social Factors: no change Caffeine: 0 Alcohol: 0 Daytime voids #per day: 3 Night-time voids #per night: 1 Urgency: mild Incontinence Episodes #per day: 1 Ankle used: right Treatment Setting: 5 Feeling/ Response: both Comments: pt tolerated well  Performed By: Mervin Hack, CMA  Assistant:   Follow Up: #5 in 1 week

## 2020-02-22 ENCOUNTER — Other Ambulatory Visit: Payer: Self-pay

## 2020-02-22 ENCOUNTER — Encounter: Payer: Self-pay | Admitting: Urology

## 2020-02-22 ENCOUNTER — Telehealth: Payer: Self-pay

## 2020-02-22 ENCOUNTER — Ambulatory Visit (INDEPENDENT_AMBULATORY_CARE_PROVIDER_SITE_OTHER): Payer: Medicare Other | Admitting: Urology

## 2020-02-22 DIAGNOSIS — R350 Frequency of micturition: Secondary | ICD-10-CM

## 2020-02-22 NOTE — Telephone Encounter (Signed)
Left a message stating there is no reason to send her to diabetic specialist, her A1C at last visit was 7. Continue taking Metformin and see Dr B as directed and scheduled

## 2020-02-22 NOTE — Telephone Encounter (Signed)
Copied from CRM [id number]. Topic: Referral - Request for Referral >> Feb 22, 2020  9:58 AM Dalphine Handing A wrote: Patient would like a referral placed for diabetes specialist in the [place] area. Please advise

## 2020-02-27 ENCOUNTER — Other Ambulatory Visit: Payer: Self-pay | Admitting: Internal Medicine

## 2020-02-27 DIAGNOSIS — E1169 Type 2 diabetes mellitus with other specified complication: Secondary | ICD-10-CM

## 2020-02-27 DIAGNOSIS — I69354 Hemiplegia and hemiparesis following cerebral infarction affecting left non-dominant side: Secondary | ICD-10-CM

## 2020-02-27 NOTE — Telephone Encounter (Signed)
Requested medication (s) are due for refill today: yes  Requested medication (s) are on the active medication list: yes  Last refill:  02/01/20  Future visit scheduled: no  Notes to clinic:  med not delegated to NT to RF   Requested Prescriptions  Pending Prescriptions Disp Refills   baclofen (LIORESAL) 10 MG tablet [Pharmacy Med Name: Baclofen 10 MG Oral Tablet] 60 tablet     Sig: Take 1 tablet by mouth twice daily      Not Delegated - Analgesics:  Muscle Relaxants Failed - 02/27/2020 12:37 PM      Failed - This refill cannot be delegated      Passed - Valid encounter within last 6 months    Recent Outpatient Visits           2 months ago Type II diabetes mellitus with complication [facility])   [facility] [name], MD   5 months ago Supraspinatus tendinitis, right   [facility] Juline Patch, MD   9 months ago Primary osteoarthritis of right hand   [facility] [name], MD   1 year ago Annual physical exam   [facility] [name], MD   1 year ago Uncontrolled type 2 diabetes mellitus without complication, without long-term current use of insulin [facility])   [facility] [name], MD       Future Appointments             In 2 days McGowan, Hunt Oris, PA-C [place]   In 1 week Nori Riis, PA-C [facility] [place]   In 2 weeks McGowan, Hunt Oris, PA-C [place] [facility] [place]   In 3 weeks McGowan, Hunt Oris, PA-C [facility] [place]   In 1 month McGowan, Gordan Payment [place] [facility] [place]             Signed Prescriptions Disp Refills   metFORMIN (GLUCOPHAGE-XR) 500 MG 24 hr tablet 90 tablet 1    Sig: TAKE 1 TABLET BY MOUTH WITH BREAKFAST      Endocrinology:  Diabetes - Biguanides Passed - 02/27/2020 12:37 PM      Passed - Cr in normal range and within 360 days    Creatinine, Ser  Date Value Ref  Range Status  12/03/2019 0.74 0.57 - 1.00 mg/dL Final          Passed - HBA1C is between 0 and 7.9 and within 180 days    Hgb A1c MFr Bld  Date Value Ref Range Status  12/03/2019 7.0 (H) 4.8 - 5.6 % Final    Comment:             Prediabetes: 5.7 - 6.4          Diabetes: >6.4          Glycemic control for adults with diabetes: <7.0           Passed - eGFR in normal range and within 360 days    GFR calc Af Amer  Date Value Ref Range Status  12/03/2019 91 >59 mL/min/1.73 Final    Comment:    **[facility] currently reports eGFR in compliance with the current**   recommendations of the [employer]. [facility] will   update reporting as new guidelines are published from the NKF-ASN   Task force.    GFR calc non Af Amer  Date Value Ref Range Status  12/03/2019 79 >59 mL/min/1.73  Final          Passed - Valid encounter within last 6 months    Recent Outpatient Visits           2 months ago Type II diabetes mellitus with complication [facility])   [facility] [name], MD   5 months ago Supraspinatus tendinitis, right   [facility] Juline Patch, MD   9 months ago Primary osteoarthritis of right hand   [facility] [name], MD   1 year ago Annual physical exam   [facility] [name], MD   1 year ago Uncontrolled type 2 diabetes mellitus without complication, without long-term current use of insulin [facility])   [facility] [name], MD       Future Appointments             In 2 days McGowan, Hunt Oris, PA-C [place]   In 1 week Laneta Simmers [place]   In 2 weeks McGowan, Gordan Payment [facility] [place]   In 3 weeks McGowan, Gordan Payment [place] [facility] [place]   In 1 month McGowan, Hunt Oris, PA-C  [facility] [place]              atorvastatin (LIPITOR) 10 MG tablet  90 tablet 3    Sig: Take 1 tablet by mouth once daily      Cardiovascular:  Antilipid - Statins Failed - 02/27/2020 12:37 PM      Failed - LDL in normal range and within 360 days    LDL Chol Calc (NIH)  Date Value Ref Range Status  12/03/2019 85 0 - 99 mg/dL Final          Passed - Total Cholesterol in normal range and within 360 days    Cholesterol, Total  Date Value Ref Range Status  12/03/2019 171 100 - 199 mg/dL Final          Passed - HDL in normal range and within 360 days    HDL  Date Value Ref Range Status  12/03/2019 63 >39 mg/dL Final          Passed - Triglycerides in normal range and within 360 days    Triglycerides  Date Value Ref Range Status  12/03/2019 135 0 - 149 mg/dL Final          Passed - Patient is not pregnant      Passed - Valid encounter within last 12 months    Recent Outpatient Visits           2 months ago Type II diabetes mellitus with complication [facility])   [facility] [name], MD   5 months ago Supraspinatus tendinitis, right   [place], MD   9 months ago Primary osteoarthritis of right hand   [facility] [name], MD   1 year ago Annual physical exam   [facility] [name], MD   1 year ago Uncontrolled type 2 diabetes mellitus without complication, without long-term current use of insulin [facility])   [facility] [name], MD       Future Appointments             In 2 days McGowan, Gordan Payment [place]   In 1 week McGowan, Gordan Payment [facility] [place]  In 2 weeks McGowan, Hunt Oris, PA-C [place]   In 3 weeks McGowan, Gordan Payment [place]   In 1 month McGowan, Gordan Payment [place]

## 2020-02-27 NOTE — Telephone Encounter (Signed)
Requested Prescriptions  Pending Prescriptions Disp Refills  . baclofen (LIORESAL) 10 MG tablet [Pharmacy Med Name: Baclofen 10 MG Oral Tablet] 60 tablet     Sig: Take 1 tablet by mouth twice daily     Not Delegated - Analgesics:  Muscle Relaxants Failed - 02/27/2020 12:37 PM      Failed - This refill cannot be delegated      Passed - Valid encounter within last 6 months    Recent Outpatient Visits          2 months ago Type II diabetes mellitus with complication [facility])   [facility] [name], MD   5 months ago Supraspinatus tendinitis, right   [facility] Juline Patch, MD   9 months ago Primary osteoarthritis of right hand   [facility] [name], MD   1 year ago Annual physical exam   [facility] [name], MD   1 year ago Uncontrolled type 2 diabetes mellitus without complication, without long-term current use of insulin [facility])   [facility] [name], MD      Future Appointments            In 2 days McGowan, Gordan Payment [place]   In 1 week Laneta Simmers [place]   In 2 weeks McGowan, Gordan Payment [place]   In 3 weeks McGowan, Gordan Payment [place]   In 1 month McGowan, Gordan Payment [place]           . metFORMIN (GLUCOPHAGE-XR) 500 MG 24 hr tablet [Pharmacy Med Name: metFORMIN HCl ER 500 MG Oral Tablet Extended Release 24 Hour] 90 tablet 1    Sig: TAKE 1 TABLET BY MOUTH WITH BREAKFAST     Endocrinology:  Diabetes - Biguanides Passed - 02/27/2020 12:37 PM      Passed - Cr in normal range and within 360 days    Creatinine, Ser  Date Value Ref Range Status  12/03/2019 0.74 0.57 - 1.00 mg/dL Final         Passed - HBA1C is between 0 and 7.9 and within 180 days    Hgb A1c MFr Bld  Date Value Ref Range Status  12/03/2019 7.0 (H) 4.8 -  5.6 % Final    Comment:             Prediabetes: 5.7 - 6.4          Diabetes: >6.4          Glycemic control for adults with diabetes: <7.0          Passed - eGFR in normal range and within 360 days    GFR calc Af Amer  Date Value Ref Range Status  12/03/2019 91 >59 mL/min/1.73 Final    Comment:    **[facility] currently reports eGFR in compliance with the current**   recommendations of the [employer]. [facility] will   update reporting as new guidelines are published from the NKF-ASN   Task force.    GFR calc non Af Amer  Date Value Ref Range Status  12/03/2019 79 >59 mL/min/1.73 Final         Passed - Valid encounter within last 6 months    Recent Outpatient Visits          2 months ago Type II diabetes mellitus with complication ([place])  [facility] [name], MD   5 months ago Supraspinatus tendinitis, right   [facility] Juline Patch, MD   9 months ago Primary osteoarthritis of right hand   [facility] [name], MD   1 year ago Annual physical exam   [facility] [name], MD   1 year ago Uncontrolled type 2 diabetes mellitus without complication, without long-term current use of insulin [facility])   [name], MD      Future Appointments            In 2 days McGowan, Gordan Payment [place]   In 1 week Laneta Simmers [place]   In 2 weeks McGowan, Gordan Payment [place]   In 3 weeks McGowan, Gordan Payment [place]   In 1 month McGowan, Gordan Payment [place]           . atorvastatin (LIPITOR) 10 MG tablet [Pharmacy Med Name: Atorvastatin Calcium 10 MG Oral Tablet] 90 tablet 3    Sig: Take 1 tablet by mouth once daily     Cardiovascular:  Antilipid - Statins Failed - 02/27/2020 12:37 PM      Failed - LDL in  normal range and within 360 days    LDL Chol Calc (NIH)  Date Value Ref Range Status  12/03/2019 85 0 - 99 mg/dL Final         Passed - Total Cholesterol in normal range and within 360 days    Cholesterol, Total  Date Value Ref Range Status  12/03/2019 171 100 - 199 mg/dL Final         Passed - HDL in normal range and within 360 days    HDL  Date Value Ref Range Status  12/03/2019 63 >39 mg/dL Final         Passed - Triglycerides in normal range and within 360 days    Triglycerides  Date Value Ref Range Status  12/03/2019 135 0 - 149 mg/dL Final         Passed - Patient is not pregnant      Passed - Valid encounter within last 12 months    Recent Outpatient Visits          2 months ago Type II diabetes mellitus with complication [facility])   [facility] [name], MD   5 months ago Supraspinatus tendinitis, right   [place], [place]   9 months ago Primary osteoarthritis of right hand   [facility] [name], MD   1 year ago Annual physical exam   [facility] [name], MD   1 year ago Uncontrolled type 2 diabetes mellitus without complication, without long-term current use of insulin [facility])   [facility] [name], MD      Future Appointments            In 2 days McGowan, Gordan Payment [place]   In 1 week Laneta Simmers [place]   In 2 weeks McGowan, Gordan Payment [place]   In 3 weeks McGowan, Gordan Payment [place]   In 1 month McGowan, Gordan Payment [facility] [place]

## 2020-02-28 NOTE — Progress Notes (Signed)
PTNS  Session # 5  Health & Social Factors:  Caffeine: 0 Alcohol: 0 Daytime voids #per day: 5-6 Night-time voids #per night: 1-2 Urgency: mild Incontinence Episodes #per day: 1 Ankle used: Right Treatment Setting: 7 Feeling/ Response: Sensory Comments: Patient tolerated the procedure well.  Performed By: Michiel Cowboy, PA-C   Assistant: Patrina Levering, CMA  Follow Up: one week for # 6 PTNS

## 2020-02-29 ENCOUNTER — Ambulatory Visit: Payer: Self-pay | Admitting: Urology

## 2020-02-29 ENCOUNTER — Other Ambulatory Visit: Payer: Self-pay

## 2020-02-29 ENCOUNTER — Ambulatory Visit (INDEPENDENT_AMBULATORY_CARE_PROVIDER_SITE_OTHER): Payer: Medicare Other | Admitting: Urology

## 2020-02-29 VITALS — BP 159/83 | HR 78 | Wt 160.0 lb

## 2020-02-29 DIAGNOSIS — R350 Frequency of micturition: Secondary | ICD-10-CM | POA: Diagnosis not present

## 2020-03-01 ENCOUNTER — Other Ambulatory Visit: Payer: Self-pay | Admitting: Internal Medicine

## 2020-03-01 ENCOUNTER — Telehealth: Payer: Self-pay

## 2020-03-01 DIAGNOSIS — Z1231 Encounter for screening mammogram for malignant neoplasm of breast: Secondary | ICD-10-CM

## 2020-03-01 NOTE — Telephone Encounter (Signed)
Copied from CRM [id number]. Topic: Referral - Request for Referral >> Mar 01, 2020 10:40 AM Gwenlyn Fudge wrote: Has patient seen PCP for this complaint? Yes.   *If NO, is insurance requiring patient see PCP for this issue before PCP can refer them? Referral for which specialty: Mammography  Preferred provider/office: In [place]  Reason for referral: Yearly Mammogram

## 2020-03-01 NOTE — Telephone Encounter (Signed)
Please Advise.  KP

## 2020-03-01 NOTE — Telephone Encounter (Signed)
I ordered her mammogram.  Give her the number to call to schedule.

## 2020-03-01 NOTE — Telephone Encounter (Signed)
Called pt told her to call and schedule mamm. Order has been placed. Pt verbalized understanding. Gave pt the number to call [phone].  KP

## 2020-03-02 ENCOUNTER — Encounter: Payer: Self-pay | Admitting: Internal Medicine

## 2020-03-02 ENCOUNTER — Other Ambulatory Visit: Payer: Self-pay

## 2020-03-02 ENCOUNTER — Ambulatory Visit (INDEPENDENT_AMBULATORY_CARE_PROVIDER_SITE_OTHER): Payer: Medicare Other | Admitting: Internal Medicine

## 2020-03-02 ENCOUNTER — Other Ambulatory Visit: Payer: Self-pay | Admitting: *Deleted

## 2020-03-02 VITALS — BP 118/74 | HR 80 | Temp 97.9°F | Ht 59.0 in | Wt 161.0 lb

## 2020-03-02 DIAGNOSIS — M810 Age-related osteoporosis without current pathological fracture: Secondary | ICD-10-CM | POA: Diagnosis not present

## 2020-03-02 DIAGNOSIS — E1129 Type 2 diabetes mellitus with other diabetic kidney complication: Secondary | ICD-10-CM | POA: Diagnosis not present

## 2020-03-02 DIAGNOSIS — R809 Proteinuria, unspecified: Secondary | ICD-10-CM | POA: Diagnosis not present

## 2020-03-02 DIAGNOSIS — E118 Type 2 diabetes mellitus with unspecified complications: Secondary | ICD-10-CM | POA: Diagnosis not present

## 2020-03-02 MED ORDER — ALENDRONATE SODIUM 70 MG PO TABS: 70.0000 mg | ORAL_TABLET | ORAL | 3 refills | Status: DC

## 2020-03-02 NOTE — Patient Instructions (Signed)
Cut back on finger stick blood sugar tests to 2-3 times per week.

## 2020-03-02 NOTE — Progress Notes (Signed)
Date:  03/02/2020   Name:  Sharon York   DOB:  31-Dec-1945   MRN:  [id number]   Chief Complaint: Diabetes (last reading - 139 this morning )  Diabetes She presents for her follow-up diabetic visit. She has type 2 diabetes mellitus. There are no hypoglycemic associated symptoms. Pertinent negatives for hypoglycemia include no headaches or tremors. Associated symptoms include weakness. Pertinent negatives for diabetes include no chest pain, no fatigue, no polydipsia and no polyuria. Symptoms are stable. Current diabetic treatment includes oral agent (monotherapy). She is compliant with treatment all of the time. Her weight is stable. She monitors blood glucose at home 1-2 x per day. Her breakfast blood glucose is taken between 6-7 am. Her breakfast blood glucose range is generally 110-130 mg/dl. Her dinner blood glucose is taken between 6-7 pm. Her dinner blood glucose range is generally 130-140 mg/dl. An ACE inhibitor/angiotensin II receptor blocker is being taken. Eye exam is current.  Hypertension This is a chronic problem. The problem is controlled. Pertinent negatives include no chest pain, headaches, palpitations or shortness of breath. Past treatments include ACE inhibitors. The current treatment provides significant improvement. There are no compliance problems.     Lab Results  Component Value Date   CREATININE 0.74 12/03/2019   BUN 11 12/03/2019   NA 142 12/03/2019   K 4.5 12/03/2019   CL 99 12/03/2019   CO2 29 12/03/2019   Lab Results  Component Value Date   CHOL 171 12/03/2019   HDL 63 12/03/2019   LDLCALC 85 12/03/2019   TRIG 135 12/03/2019   CHOLHDL 2.7 12/03/2019   Lab Results  Component Value Date   TSH 3.740 01/16/2019   Lab Results  Component Value Date   HGBA1C 7.0 (H) 12/03/2019   Lab Results  Component Value Date   WBC 5.9 01/16/2019   HGB 12.1 01/16/2019   HCT 38.9 01/16/2019   MCV 74 (L) 01/16/2019   PLT 275 01/16/2019   Lab Results   Component Value Date   ALT 13 12/03/2019   AST 24 12/03/2019   ALKPHOS 51 12/03/2019   BILITOT 0.3 12/03/2019     Review of Systems  Constitutional: Negative for appetite change, fatigue, fever and unexpected weight change.  HENT: Negative for trouble swallowing.   Eyes: Negative for visual disturbance.  Respiratory: Negative for cough, chest tightness and shortness of breath.   Cardiovascular: Negative for chest pain, palpitations and leg swelling.  Gastrointestinal: Negative for abdominal pain.  Endocrine: Negative for polydipsia and polyuria.  Genitourinary: Positive for urgency (getting treatments from Urology). Negative for dysuria and hematuria.  Musculoskeletal: Positive for gait problem. Negative for arthralgias.  Neurological: Positive for weakness. Negative for tremors, numbness and headaches.  Psychiatric/Behavioral: Negative for dysphoric mood.    Patient Active Problem List   Diagnosis Date Noted  . Microalbuminuria due to type 2 diabetes mellitus (HCC) 12/07/2019  . Type II diabetes mellitus with complication (HCC) 04/21/2018  . Right shoulder pain 03/31/2018  . Hyperlipidemia associated with type 2 diabetes mellitus (HCC) 06/12/2017  . Tinea corporis 03/17/2015  . Abnormal gait 12/15/2014  . Altered bowel function 12/15/2014  . Hemiparesis affecting left side as late effect of cerebrovascular accident (CVA) (HCC) 12/15/2014  . Obstructive sleep apnea of adult 12/15/2014  . Arthritis of shoulder region, degenerative 12/15/2014  . OP (osteoporosis) 12/15/2014  . Allergic rhinitis, seasonal 12/15/2014  . Mixed incontinence 03/11/2013    Allergies  Allergen Reactions  . Nitrofurantoin Nausea Only  .  Iodinated Diagnostic Agents Hives and Cough    Patient developed a hive on her left lip after injection as well as some coughing.     Past Surgical History:  Procedure Laterality Date  . CESAREAN SECTION  1975  . COLONOSCOPY  2010   normal    Social  History   Tobacco Use  . Smoking status: Never Smoker  . Smokeless tobacco: Never Used  . Tobacco comment: smoking cessation materials not required  Vaping Use  . Vaping Use: Never used  Substance Use Topics  . Alcohol use: No    Alcohol/week: 0.0 standard drinks  . Drug use: Never     Medication list has been reviewed and updated.  Current Meds  Medication Sig  . Accu-Chek Softclix Lancets lancets USE TO CHECK GLUCOSE UP TO 4 TIMES DAILY  . alendronate (FOSAMAX) 70 MG tablet TAKE 1 TABLET BY MOUTH ONCE A WEEK TAKE  WITH  A  FULL  GLASS  OF  WATER  ON  AN  EMPTY  STOMACH  . aspirin 81 MG chewable tablet Chew 1 tablet by mouth daily.  Marland Kitchen atorvastatin (LIPITOR) 10 MG tablet Take 1 tablet by mouth once daily  . baclofen (LIORESAL) 10 MG tablet Take 1 tablet by mouth twice daily  . Blood Glucose Monitoring Suppl (POGO AUTOMATIC BLOOD GLUCOSE) DEVI 1 each by Does not apply route daily.  . Calcium Carbonate-Vitamin D (CALCIUM 500 + D) 500-125 MG-UNIT TABS Take 1 tablet by mouth daily.   Marland Kitchen lisinopril (ZESTRIL) 2.5 MG tablet Take 1 tablet (2.5 mg total) by mouth daily.  . metFORMIN (GLUCOPHAGE-XR) 500 MG 24 hr tablet TAKE 1 TABLET BY MOUTH WITH BREAKFAST  . Multiple Vitamins-Minerals (MULTIVITAMIN ADULT PO) Take by mouth.    PHQ 2/9 Scores 12/03/2019 09/04/2019 05/18/2019 03/11/2019  PHQ - 2 Score 0 0 0 0  PHQ- 9 Score 0 0 - -    GAD 7 : Generalized Anxiety Score 12/03/2019 09/04/2019  Nervous, Anxious, on Edge 0 0  Control/stop worrying 0 1  Worry too much - different things 0 0  Trouble relaxing 0 0  Restless 0 0  Easily annoyed or irritable 0 0  Afraid - awful might happen 0 0  Total GAD 7 Score 0 1  Anxiety Difficulty Not difficult at all Not difficult at all    BP Readings from Last 3 Encounters:  03/02/20 118/74  02/29/20 (!) 159/83  12/28/19 134/80    Physical Exam Vitals and nursing note reviewed.  Constitutional:      General: She is not in acute distress.     Appearance: She is well-developed.  HENT:     Head: Normocephalic and atraumatic.  Cardiovascular:     Rate and Rhythm: Normal rate and regular rhythm.     Pulses: Normal pulses.  Pulmonary:     Effort: Pulmonary effort is normal. No respiratory distress.     Breath sounds: No wheezing or rhonchi.  Musculoskeletal:     Cervical back: Normal range of motion.     Right lower leg: No edema.     Left lower leg: No edema.  Lymphadenopathy:     Cervical: No cervical adenopathy.  Skin:    General: Skin is warm and dry.     Findings: No rash.  Neurological:     Mental Status: She is alert and oriented to person, place, and time. Mental status is at baseline.     Gait: Gait abnormal.     Comments: LUE  hemiparesis LLE weakness with foot drop  Psychiatric:        Behavior: Behavior normal.        Thought Content: Thought content normal.     Wt Readings from Last 3 Encounters:  03/02/20 161 lb (73 kg)  02/29/20 160 lb (72.6 kg)  12/28/19 163 lb (73.9 kg)    BP 118/74 (BP Location: Right Arm, Patient Position: Sitting)   Pulse 80   Temp 97.9 F (36.6 C) (Oral)   Ht 4\' 11"  (1.499 m)   Wt 161 lb (73 kg)   SpO2 96%   BMI 32.52 kg/m   Assessment and Plan: 1. Type II diabetes mellitus with complication (HCC) Clinically stable by exam and report without s/s of hypoglycemia. DM complicated by microalbuminuria. Tolerating medications - metformin -  well without side effects or other concerns. Reduce FSBS to twice a week to conserve testing strips (POGO) - Hemoglobin A1c  2. Microalbuminuria due to type 2 diabetes mellitus (HCC) On ACEI therapy  3. Osteoporosis without current pathological fracture, unspecified osteoporosis type Tolerating medication without problems DEXA due next year - alendronate (FOSAMAX) 70 MG tablet; Take 1 tablet (70 mg total) by mouth once a week. Take with a full glass of water on an empty stomach.  Dispense: 12 tablet; Refill: 3   Partially dictated  using . Any errors are unintentional.  Animal nutritionist, MD [facility]  03/02/2020

## 2020-03-02 NOTE — Patient Outreach (Signed)
[facility]) Care Management  03/02/2020  DEBRIA BROECKER 04/28/1946 [id number]   RN Health Coach attempted follow up outreach call to patient.  Patient was unavailable. No voicemail pick up.  Plan: RN will call patient again within 30 days.  [name] BSN RN [facility] Care Management [phone]

## 2020-03-03 ENCOUNTER — Other Ambulatory Visit: Payer: Self-pay | Admitting: *Deleted

## 2020-03-03 LAB — HEMOGLOBIN A1C
Est. average glucose Bld gHb Est-mCnc: 154 mg/dL
Hgb A1c MFr Bld: 7 % — ABNORMAL HIGH (ref 4.8–5.6)

## 2020-03-06 NOTE — Progress Notes (Signed)
PTNS  Session # 6  Health & Social Factors: No change Caffeine: 0 Alcohol: 0 Daytime voids #per day: 6 Night-time voids #per night: 2 Urgency: Mild Incontinence Episodes #per day: 3 Ankle used: Right Treatment Setting: 8 Feeling/ Response: Sensory Comments: Patient tolerated proecdure well  Performed By: Michiel Cowboy, PA-C  Assistant: Eligha Bridegroom, CMA  Follow Up: One week for #7 PTNS  I, Theador Hawthorne, am acting as a scribe for [employer],  I have reviewed the above documentation for accuracy and completeness, and I agree with the above.    Michiel Cowboy, PA-C

## 2020-03-07 ENCOUNTER — Ambulatory Visit (INDEPENDENT_AMBULATORY_CARE_PROVIDER_SITE_OTHER): Payer: Medicare Other | Admitting: Urology

## 2020-03-07 ENCOUNTER — Other Ambulatory Visit: Payer: Self-pay

## 2020-03-07 DIAGNOSIS — R350 Frequency of micturition: Secondary | ICD-10-CM | POA: Diagnosis not present

## 2020-03-07 NOTE — Patient Outreach (Signed)
[facility]) Care Management  [facility] Care Manager  03/03/2020 Late entry  Sharon York [birth year] [id number]   RN Health Coach telephone call to patient.  [employer] compliance verified. Patient A1C is 7.0 from 7.1. Patient fasting blood sugar was 144 this am. Per patient the Dr has told her that she doesn't have to check it everyday just twice a week. Patient is [place] in the park. Patient has not had any recent falls. She has received her COVID booster and flu shot. Patient blood pressure is better controlled 118/74. She is eating healthier. Patient has agreed to follow up outreach calls.    Encounter Medications:  Outpatient Encounter Medications as of 03/03/2020  Medication Sig  . Accu-Chek Softclix Lancets lancets USE TO CHECK GLUCOSE UP TO 4 TIMES DAILY  . alendronate (FOSAMAX) 70 MG tablet Take 1 tablet (70 mg total) by mouth once a week. Take with a full glass of water on an empty stomach.  Marland Kitchen aspirin 81 MG chewable tablet Chew 1 tablet by mouth daily.  Marland Kitchen atorvastatin (LIPITOR) 10 MG tablet Take 1 tablet by mouth once daily  . baclofen (LIORESAL) 10 MG tablet Take 1 tablet by mouth twice daily  . Blood Glucose Monitoring Suppl (POGO AUTOMATIC BLOOD GLUCOSE) DEVI 1 each by Does not apply route daily.  . Calcium Carbonate-Vitamin D (CALCIUM 500 + D) 500-125 MG-UNIT TABS Take 1 tablet by mouth daily.   Marland Kitchen lisinopril (ZESTRIL) 2.5 MG tablet Take 1 tablet (2.5 mg total) by mouth daily.  . metFORMIN (GLUCOPHAGE-XR) 500 MG 24 hr tablet TAKE 1 TABLET BY MOUTH WITH BREAKFAST  . Multiple Vitamins-Minerals (MULTIVITAMIN ADULT PO) Take by mouth.   No facility-administered encounter medications on file as of 03/03/2020.    Functional Status:  In your present state of health, do you have any difficulty performing the following activities: 12/01/2019 03/11/2019  Hearing? Y N  Comment patient has bilateral hearing aids slight hoh  Vision? N N  Difficulty concentrating or  making decisions? N N  Walking or climbing stairs? Y Y  Comment uses a cane and hemiparesis uses cane or walker/ results from cva  Dressing or bathing? Y N  Comment yes daughter assists -  Doing errands, shopping? N Y  Comment - daughter does Multimedia programmer and eating ? N Y  Comment - reeceives meals on wheels  Using the Toilet? N N  In the past six months, have you accidently leaked urine? N Y  Do you have problems with loss of bowel control? N N  Managing your Medications? N N  Managing your Finances? Malvin Johns  Comment daughter assists daughter assists  Housekeeping or managing your Housekeeping? Malvin Johns  Comment daughter assists/ Patient lives with daughter daughter assists  Some recent data might be hidden    Fall/Depression Screening: Fall Risk  03/03/2020 12/03/2019 12/01/2019  Falls in the past year? 1 0 1  Comment - - -  Number falls in past yr: 0 0 0  Injury with Fall? 0 0 0  Risk Factor Category  - - -  Risk for fall due to : History of fall(s);Impaired balance/gait;Impaired mobility No Fall Risks History of fall(s);Impaired balance/gait;Impaired mobility  Risk for fall due to: Comment - - -  Follow up Falls evaluation completed Falls evaluation completed Falls evaluation completed;Falls prevention discussed   PHQ 2/9 Scores 12/03/2019 09/04/2019 05/18/2019 03/11/2019 01/16/2019 01/16/2019 01/12/2019  PHQ - 2 Score 0 0 0 0 0 0 0  PHQ- 9 Score 0 0 - - - 0 2    Assessment:  Goals Addressed            This Visit's Progress   . Learn More About My Health       Follow Up Date [id number]   - ask questions - repeat what I heard to make sure I understand - bring a list of my medicines to the visit    Why is this important?   The best way to learn about your health and care is by talking to the doctor and nurse.  They will answer your questions and give you information in the way that you like best.    Notes:     Marland Kitchen Make and Keep All Appointments       Follow Up Date  [id number]   - call to cancel if needed - keep a calendar with prescription refill dates - keep a calendar with appointment dates    Why is this important?   Part of staying healthy is seeing the doctor for follow-up care.  If you forget your appointments, there are some things you can do to stay on track.    Notes:     Marland Kitchen Monitor and Manage My Blood Sugar       Follow Up Date [id number]   - check blood sugar at prescribed times - check blood sugar if I feel it is too high or too low - enter blood sugar readings and medication or insulin into daily log - take the blood sugar log to all doctor visits - take the blood sugar meter to all doctor visits    Why is this important?   Checking your blood sugar at home helps to keep it from getting very high or very low.  Writing the results in a diary or log helps the doctor know how to care for you.  Your blood sugar log should have the time, date and the results.  Also, write down the amount of insulin or other medicine that you take.  Other information, like what you ate, exercise done and how you were feeling, will also be helpful.     Notes:     . Obtain Eye Exam       Follow Up Date [id number]   - keep appointment with eye doctor - schedule appointment with eye doctor    Why is this important?   Eye check-ups are important when you have diabetes.  Vision loss can be prevented.    Notes:     . Perform Foot Care       Follow Up Date [id number]   - check feet daily for cuts, sores or redness - keep feet up while sitting - trim toenails straight across - wash and dry feet carefully every day - wear comfortable, cotton socks - wear comfortable, well-fitting shoes    Why is this important?   Good foot care is very important when you have diabetes.  There are many things you can do to keep your feet healthy and catch a problem early.    Notes:     . Set My Target A1C       Follow Up Date [id number]   - set target A1C    Why  is this important?   Your target A1C is decided together by you and your doctor.  It is based on several things like your age and other health issues.    Notes:  Plan:  Patient will monitor blood sugars twice a week as new order Discussed healthy eating Discussed Holiday eating Patient will take medications as per ordered Patient will continue to walk daily on track RN will follow up outreach within the month of December.  RN will send update assessment to PCP  [name] BSN RN [facility] [phone]

## 2020-03-09 ENCOUNTER — Ambulatory Visit
Admission: RE | Admit: 2020-03-09 | Discharge: 2020-03-09 | Disposition: A | Discharge status: Home / Self Care | Payer: Medicare Other | Source: Ambulatory Visit | Attending: Internal Medicine | Admitting: Internal Medicine

## 2020-03-09 ENCOUNTER — Other Ambulatory Visit: Payer: Self-pay

## 2020-03-09 DIAGNOSIS — Z1231 Encounter for screening mammogram for malignant neoplasm of breast: Secondary | ICD-10-CM | POA: Diagnosis not present

## 2020-03-14 ENCOUNTER — Other Ambulatory Visit: Payer: Self-pay

## 2020-03-14 ENCOUNTER — Ambulatory Visit (INDEPENDENT_AMBULATORY_CARE_PROVIDER_SITE_OTHER): Payer: Medicare Other | Admitting: Urology

## 2020-03-14 DIAGNOSIS — R350 Frequency of micturition: Secondary | ICD-10-CM | POA: Diagnosis not present

## 2020-03-14 NOTE — Progress Notes (Signed)
PTNS  Session # 7  Health & Social Factors: No change Caffeine: 0 Alcohol: 0 Daytime voids #per day: 4 Night-time voids #per night: 1 Urgency: Mild Incontinence Episodes #per day: 0 Ankle used: Right Treatment Setting: 13 Feeling/ Response: Sensory Comments: Patient tolerated the procedure well.   Performed By: Michiel Cowboy, PA-C  Follow Up: One week for # 8 PTNS

## 2020-03-19 NOTE — Progress Notes (Signed)
PTNS  Session # 8/12  Health & Social Factors: No change Caffeine: 3 Alcohol: 0 Daytime voids #per day: 3 Night-time voids #per night: 1 Urgency: Mild Incontinence Episodes #per day: 0 Ankle used: Right Treatment Setting: 8 Feeling/ Response: Sensory  Comments: Patient tolerated procedure well  Performed By: Michiel Cowboy, PA-C  Follow Up: #9/12

## 2020-03-21 ENCOUNTER — Ambulatory Visit (INDEPENDENT_AMBULATORY_CARE_PROVIDER_SITE_OTHER): Payer: Medicare Other | Admitting: Urology

## 2020-03-21 DIAGNOSIS — R350 Frequency of micturition: Secondary | ICD-10-CM | POA: Diagnosis not present

## 2020-03-24 DIAGNOSIS — H1132 Conjunctival hemorrhage, left eye: Secondary | ICD-10-CM | POA: Diagnosis not present

## 2020-03-27 NOTE — Progress Notes (Signed)
PTNS  Session # 9/12  Health & Social Factors: No change Caffeine: 0 Alcohol: 0 Daytime voids #per day: 7 Night-time voids #per night: 1 Urgency: mild Incontinence Episodes #per day: 0 Ankle used: Right Treatment Setting: 5 Feeling/ Response: Sensory Comments: Patient tolerated the procedure well.   Performed By: Michiel Cowboy, PA-C  Assistant: Eligha Bridegroom, CMA  Follow Up: One week for #10/12 PTNS

## 2020-03-28 ENCOUNTER — Other Ambulatory Visit: Payer: Self-pay

## 2020-03-28 ENCOUNTER — Ambulatory Visit (INDEPENDENT_AMBULATORY_CARE_PROVIDER_SITE_OTHER): Payer: Medicare Other | Admitting: Urology

## 2020-03-28 DIAGNOSIS — R350 Frequency of micturition: Secondary | ICD-10-CM

## 2020-04-03 NOTE — Progress Notes (Signed)
PTNS  Session # 10/12  Health & Social Factors: No change Caffeine: 0 Alcohol: 0 Daytime voids #per day: 3-5 Night-time voids #per night: 1 Urgency: Mild Incontinence Episodes #per day: 0 Ankle used: Right Treatment Setting: 3 Feeling/ Response: Sensory Comments: Patient tolerated the procedure well  Performed By: Michiel Cowboy, PA-C  Assistant: Eligha Bridegroom, CMA  Follow Up: One week for # 11 PTNS

## 2020-04-04 ENCOUNTER — Ambulatory Visit: Payer: Self-pay | Admitting: Urology

## 2020-04-04 ENCOUNTER — Ambulatory Visit (INDEPENDENT_AMBULATORY_CARE_PROVIDER_SITE_OTHER): Payer: Medicare Other | Admitting: Urology

## 2020-04-04 ENCOUNTER — Other Ambulatory Visit: Payer: Self-pay

## 2020-04-04 DIAGNOSIS — R350 Frequency of micturition: Secondary | ICD-10-CM

## 2020-04-07 ENCOUNTER — Ambulatory Visit: Payer: Medicare Other | Admitting: Internal Medicine

## 2020-04-08 ENCOUNTER — Other Ambulatory Visit: Payer: Self-pay | Admitting: Internal Medicine

## 2020-04-08 DIAGNOSIS — I69354 Hemiplegia and hemiparesis following cerebral infarction affecting left non-dominant side: Secondary | ICD-10-CM

## 2020-04-08 NOTE — Telephone Encounter (Signed)
Requested medication (s) are due for refill today: yes  Requested medication (s) are on the active medication list: yes  Last refill:  02/29/20 #60  Future visit scheduled: yes  Notes to clinic:  Please review for refill. Refill not delegated per protocol.    Requested Prescriptions  Pending Prescriptions Disp Refills   baclofen (LIORESAL) 10 MG tablet [Pharmacy Med Name: Baclofen 10 MG Oral Tablet] 60 tablet 0    Sig: Take 1 tablet by mouth twice daily      Not Delegated - Analgesics:  Muscle Relaxants Failed - 04/08/2020  1:36 PM      Failed - This refill cannot be delegated      Passed - Valid encounter within last 6 months    Recent Outpatient Visits           1 month ago Type II diabetes mellitus with complication [facility])   [facility] Reubin Milan, MD   4 months ago Type II diabetes mellitus with complication [facility])   [facility] Reubin Milan, MD   7 months ago Supraspinatus tendinitis, right   [facility] Duanne Limerick, MD   10 months ago Primary osteoarthritis of right hand   [facility] Reubin Milan, MD   1 year ago Annual physical exam   [facility] Reubin Milan, MD       Future Appointments             In 3 days McGowan, Elana Alm [facility] [place]   In 1 week Hulan Fray [facility] [place]   In 2 weeks Marvel Plan, Elana Alm [facility] [place]   In 2 months Judithann Stohr Nyoka Cowden, MD [facility]

## 2020-04-10 NOTE — Progress Notes (Signed)
PTNS  Session # 11/12  Health & Social Factors: No change Caffeine: 0 Alcohol: 0 Daytime voids #per day: 5 Night-time voids #per night: 2 Urgency: Mild Incontinence Episodes #per day: 0 Ankle used: Right Treatment Setting: 8 Feeling/ Response: Sensory  Comments: Patient tolerated the procedure  Performed By: Michiel Cowboy, PA-C  Assistant: Eligha Bridegroom, CMA  Follow Up: One week for # 12/12

## 2020-04-11 ENCOUNTER — Other Ambulatory Visit: Payer: Self-pay

## 2020-04-11 ENCOUNTER — Ambulatory Visit (INDEPENDENT_AMBULATORY_CARE_PROVIDER_SITE_OTHER): Payer: Medicare Other | Admitting: Urology

## 2020-04-11 ENCOUNTER — Ambulatory Visit: Payer: Self-pay | Admitting: Urology

## 2020-04-11 DIAGNOSIS — R350 Frequency of micturition: Secondary | ICD-10-CM

## 2020-04-17 NOTE — Progress Notes (Signed)
PTNS  Session # 12/12  Health & Social Factors: no change Caffeine: 0 Alcohol: 0 Daytime voids #per day: 5-6 Night-time voids #per night: 1 Urgency: mild Incontinence Episodes #per day: 1 Ankle used: right Treatment Setting: 3 Feeling/ Response: both Comments: pt tolerated well  Performed By: Mervin Hack  Assistant:   Follow Up: In two weeks for OAB questionnaire and PVR

## 2020-04-18 ENCOUNTER — Ambulatory Visit (INDEPENDENT_AMBULATORY_CARE_PROVIDER_SITE_OTHER): Payer: Medicare Other | Admitting: Urology

## 2020-04-18 ENCOUNTER — Ambulatory Visit: Payer: Self-pay | Admitting: Urology

## 2020-04-18 DIAGNOSIS — R350 Frequency of micturition: Secondary | ICD-10-CM

## 2020-04-25 ENCOUNTER — Ambulatory Visit: Payer: Self-pay | Admitting: Urology

## 2020-05-01 NOTE — Progress Notes (Signed)
05/02/2020 2:27 PM   De Burrs 1945/10/14 [id number]  Referring provider: Reubin Milan, MD [address] [place],  [place] [address]  Chief Complaint  Patient presents with  . Follow-up    OAB/PVR    HPI: Patient is 74 year old female with an urological history of rUTI's, high risk hematuria (work up positive for urterine fibroids) and frequency who presents today for follow up after completing 12 weeks of PTNS.   History of rUTI's Risk factors: age, vaginal atrophy and incontinence.  No UTI's since last year.   History of hematuria (high risk) Non-smoker.  CTU in 07/2017 noted the adrenal glands and kidneys are unremarkable. No renal, ureteral or bladder calculi or mass. The delayed images do not demonstrate any significant collecting system abnormalities and both ureters appear normal other than being deviated laterally in the pelvis by the enlarged fibroid uterus. No bladder mass or asymmetric bladder wall thickening.  Cystoscopy in 07/2017 with Dr. Sherron Monday - NED.  She denies any gross hematuria.  UA 11/2019 was negative for micro heme  Frequency The patient is  experiencing urgency x 0-3 (improved), frequency x 4-7 (unchanged), not restricting fluids to avoid visits to the restroom, is engaging in toilet mapping, incontinence x 0-3 (improved) and nocturia x 0-3 (unchanged).   Her BP is 148/83.   Her PVR is 0 mL.   She is down from 5 pads to 3 pads and some days, she is completely dry.    Patient denies any modifying or aggravating factors.  Patient denies any gross hematuria, dysuria or suprapubic/flank pain.  Patient denies any fevers, chills, nausea or vomiting.   PMH: Past Medical History:  Diagnosis Date  . Absence of bladder continence 12/15/2014  . Blood pressure elevated without history of HTN 12/15/2014  . Diabetes mellitus without complication (HCC)   . Hyperlipidemia   . Osteoporosis   . Overactive bladder   . PMB (postmenopausal bleeding)  11/01/2017  . 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 CESAREAN SECTION  1975  . COLONOSCOPY  2010   normal    Home Medications:  Allergies as of 05/02/2020      Reactions   Nitrofurantoin Nausea Only   Iodinated Diagnostic Agents Hives, Cough   Patient developed a hive on her left lip after injection as well as some coughing.       Medication List       Accurate as of May 02, 2020  2:27 PM. If you have any questions, ask your nurse or doctor.        STOP taking these medications   POGO Automatic Blood Glucose Devi Stopped by: Michiel Cowboy, PA-C     TAKE these medications   Accu-Chek Softclix Lancets lancets USE TO CHECK GLUCOSE UP TO 4 TIMES DAILY   alendronate 70 MG tablet Commonly known as: FOSAMAX Take 1 tablet (70 mg total) by mouth once a week. Take with a full glass of water on an empty stomach.   aspirin 81 MG chewable tablet Chew 1 tablet by mouth daily.   atorvastatin 10 MG tablet Commonly known as: LIPITOR Take 1 tablet by mouth once daily   baclofen 10 MG tablet Commonly known as: LIORESAL Take 1 tablet by mouth twice daily   Calcium Carbonate-Vitamin D 500-125 MG-UNIT Tabs Take 1 tablet by mouth daily.   lisinopril 2.5 MG tablet Commonly known as: Zestril  Take 1 tablet (2.5 mg total) by mouth daily.   metFORMIN 500 MG 24 hr tablet Commonly known as: GLUCOPHAGE-XR TAKE 1 TABLET BY MOUTH WITH BREAKFAST   MULTIVITAMIN ADULT PO Take by mouth.       Allergies:  Allergies  Allergen Reactions  . Nitrofurantoin Nausea Only  . Iodinated Diagnostic Agents Hives and Cough    Patient developed a hive on her left lip after injection as well as some coughing.     Family History: Family History  Problem Relation Age of Onset  . Diabetes Mother   . Thyroid disease Mother   . Breast cancer Maternal Aunt   . Breast cancer Cousin        mat cousin  . Prostate  cancer Brother   . Kidney failure Brother   . Kidney cancer Son   . Bladder Cancer Neg Hx     Social History:  reports that she has never smoked. She has never used smokeless tobacco. She reports that she does not drink alcohol and does not use drugs.  ROS: For pertinent review of systems please refer to history of present illness  Physical Exam: BP (!) 148/83   Pulse 74   Ht 4\' 11"  (1.499 m)   Wt 160 lb (72.6 kg)   BMI 32.32 kg/m   Constitutional:  Well nourished. Alert and oriented, No acute distress. HEENT: [place] AT, mask in place.  Trachea midline Cardiovascular: No clubbing, cyanosis, or edema. Respiratory: Normal respiratory effort, no increased work of breathing. Neurologic: Grossly intact, no focal deficits, moving all 4 extremities. Psychiatric: Normal mood and affect.   Laboratory Data: Lab Results  Component Value Date   WBC 5.9 01/16/2019   HGB 12.1 01/16/2019   HCT 38.9 01/16/2019   MCV 74 (L) 01/16/2019   PLT 275 01/16/2019    Lab Results  Component Value Date   CREATININE 0.74 12/03/2019    Lab Results  Component Value Date   HGBA1C 7.0 (H) 03/02/2020    Lab Results  Component Value Date   TSH 3.740 01/16/2019       Component Value Date/Time   CHOL 171 12/03/2019 0908   HDL 63 12/03/2019 0908   CHOLHDL 2.7 12/03/2019 0908   LDLCALC 85 12/03/2019 0908    Lab Results  Component Value Date   AST 24 12/03/2019   Lab Results  Component Value Date   ALT 13 12/03/2019    Urinalysis Component     Latest Ref Rng & Units 12/07/2019          Color, Urine     YELLOW YELLOW  Appearance     CLEAR CLEAR  Specific Gravity, Urine     1.005 - 1.030 1.015  pH     5.0 - 8.0 >9.0 (H)  Glucose, UA     NEGATIVE mg/dL NEGATIVE  Hgb urine dipstick     NEGATIVE NEGATIVE  Bilirubin Urine     NEGATIVE NEGATIVE  Ketones, ur     NEGATIVE mg/dL NEGATIVE  Protein     NEGATIVE mg/dL NEGATIVE  Nitrite     NEGATIVE NEGATIVE  Leukocytes,Ua      NEGATIVE NEGATIVE  Squamous Epithelial / LPF     0 - 5 0-5  WBC, UA     0 - 5 WBC/hpf NONE SEEN  RBC / HPF     0 - 5 RBC/hpf 0-5  Bacteria, UA     NONE SEEN NONE SEEN  Urine-Other  LESS THAN 10 mL OF URINE SUBMITTED   I have reviewed the labs.  Pertinent Imaging Results for CHERRISE, OCCHIPINTI (MRN [id number]) as of 05/02/2020 11:35  Ref. Range 05/02/2020 11:24  Scan Result Unknown 0 ml    Assessment & Plan:    1. Frequency Failed OAB medication Great improvement with PTNS She would like to continue the treatments at this time She will RTC in 2 weeks to start her monthly maintenance treatments    Return in about 2 weeks (around 05/16/2020) for Maintenance PTNS.  These notes generated with voice recognition software. I apologize for typographical errors.  Michiel Cowboy, PA-C  [facility] [address]  [address] [place], [place] [address] [phone]

## 2020-05-02 ENCOUNTER — Other Ambulatory Visit: Payer: Self-pay

## 2020-05-02 ENCOUNTER — Ambulatory Visit (INDEPENDENT_AMBULATORY_CARE_PROVIDER_SITE_OTHER): Payer: Medicare Other | Admitting: Urology

## 2020-05-02 ENCOUNTER — Encounter: Payer: Self-pay | Admitting: Urology

## 2020-05-02 VITALS — BP 148/83 | HR 74 | Ht 59.0 in | Wt 160.0 lb

## 2020-05-02 DIAGNOSIS — Z87440 Personal history of urinary (tract) infections: Secondary | ICD-10-CM

## 2020-05-02 DIAGNOSIS — Z87448 Personal history of other diseases of urinary system: Secondary | ICD-10-CM | POA: Diagnosis not present

## 2020-05-02 DIAGNOSIS — R350 Frequency of micturition: Secondary | ICD-10-CM | POA: Diagnosis not present

## 2020-05-02 LAB — BLADDER SCAN AMB NON-IMAGING: Scan Result: 0

## 2020-05-17 ENCOUNTER — Other Ambulatory Visit: Payer: Self-pay | Admitting: *Deleted

## 2020-05-18 ENCOUNTER — Ambulatory Visit (INDEPENDENT_AMBULATORY_CARE_PROVIDER_SITE_OTHER): Payer: Medicare Other | Admitting: Internal Medicine

## 2020-05-18 ENCOUNTER — Other Ambulatory Visit: Payer: Self-pay

## 2020-05-18 ENCOUNTER — Encounter: Payer: Self-pay | Admitting: Internal Medicine

## 2020-05-18 VITALS — BP 104/68 | HR 91 | Ht 59.0 in | Wt 160.0 lb

## 2020-05-18 DIAGNOSIS — B354 Tinea corporis: Secondary | ICD-10-CM

## 2020-05-18 MED ORDER — NYSTATIN 100000 UNIT/GM EX CREA: 1.0000 "application " | TOPICAL_CREAM | Freq: Two times a day (BID) | CUTANEOUS | 0 refills | Status: DC

## 2020-05-18 MED ORDER — NYSTATIN 100000 UNIT/GM EX POWD: 1.0000 "application " | Freq: Two times a day (BID) | CUTANEOUS | 1 refills | Status: DC

## 2020-05-18 NOTE — Progress Notes (Signed)
Date:  05/18/2020   Name:  Sharon York   DOB:  1946-04-24   MRN:  [id number]   Chief Complaint: Rash (Rash under left breast. X 1 week. Itching. Not painful. Had one in the past in the summer time.)  Rash This is a new problem. The current episode started in the past 7 days. The problem has been gradually worsening since onset. The affected locations include the torso (under the left breast). The rash is characterized by itchiness and redness. Pertinent negatives include no fatigue, fever or shortness of breath. Past treatments include nothing.    Lab Results  Component Value Date   CREATININE 0.74 12/03/2019   BUN 11 12/03/2019   NA 142 12/03/2019   K 4.5 12/03/2019   CL 99 12/03/2019   CO2 29 12/03/2019   Lab Results  Component Value Date   CHOL 171 12/03/2019   HDL 63 12/03/2019   LDLCALC 85 12/03/2019   TRIG 135 12/03/2019   CHOLHDL 2.7 12/03/2019   Lab Results  Component Value Date   TSH 3.740 01/16/2019   Lab Results  Component Value Date   HGBA1C 7.0 (H) 03/02/2020   Lab Results  Component Value Date   WBC 5.9 01/16/2019   HGB 12.1 01/16/2019   HCT 38.9 01/16/2019   MCV 74 (L) 01/16/2019   PLT 275 01/16/2019   Lab Results  Component Value Date   ALT 13 12/03/2019   AST 24 12/03/2019   ALKPHOS 51 12/03/2019   BILITOT 0.3 12/03/2019     Review of Systems  Constitutional: Negative for chills, fatigue and fever.  Respiratory: Negative for chest tightness and shortness of breath.   Cardiovascular: Negative for chest pain.  Skin: Positive for rash.    Patient Active Problem List   Diagnosis Date Noted  . Microalbuminuria due to type 2 diabetes mellitus (HCC) 12/07/2019  . Type II diabetes mellitus with complication (HCC) 04/21/2018  . Right shoulder pain 03/31/2018  . Hyperlipidemia associated with type 2 diabetes mellitus (HCC) 06/12/2017  . Tinea corporis 03/17/2015  . Abnormal gait 12/15/2014  . Altered bowel function 12/15/2014  .  Hemiparesis affecting left side as late effect of cerebrovascular accident (CVA) (HCC) 12/15/2014  . Obstructive sleep apnea of adult 12/15/2014  . Arthritis of shoulder region, degenerative 12/15/2014  . OP (osteoporosis) 12/15/2014  . Allergic rhinitis, seasonal 12/15/2014  . Mixed incontinence 03/11/2013    Allergies  Allergen Reactions  . Nitrofurantoin Nausea Only  . Iodinated Diagnostic Agents Hives and Cough    Patient developed a hive on her left lip after injection as well as some coughing.     Past Surgical History:  Procedure Laterality Date  . CESAREAN SECTION  1975  . COLONOSCOPY  2010   normal    Social History   Tobacco Use  . Smoking status: Never Smoker  . Smokeless tobacco: Never Used  . Tobacco comment: smoking cessation materials not required  Vaping Use  . Vaping Use: Never used  Substance Use Topics  . Alcohol use: No    Alcohol/week: 0.0 standard drinks  . Drug use: Never     Medication list has been reviewed and updated.  Current Meds  Medication Sig  . Accu-Chek Softclix Lancets lancets USE TO CHECK GLUCOSE UP TO 4 TIMES DAILY  . alendronate (FOSAMAX) 70 MG tablet Take 1 tablet (70 mg total) by mouth once a week. Take with a full glass of water on an empty stomach.  Marland Kitchen  aspirin 81 MG chewable tablet Chew 1 tablet by mouth daily.  Marland Kitchen atorvastatin (LIPITOR) 10 MG tablet Take 1 tablet by mouth once daily  . baclofen (LIORESAL) 10 MG tablet Take 1 tablet by mouth twice daily  . Calcium Carbonate-Vitamin D 500-125 MG-UNIT TABS Take 1 tablet by mouth daily.   Marland Kitchen lisinopril (ZESTRIL) 2.5 MG tablet Take 1 tablet (2.5 mg total) by mouth daily.  . metFORMIN (GLUCOPHAGE-XR) 500 MG 24 hr tablet TAKE 1 TABLET BY MOUTH WITH BREAKFAST  . Multiple Vitamins-Minerals (MULTIVITAMIN ADULT PO) Take by mouth.    PHQ 2/9 Scores 05/18/2020 12/03/2019 09/04/2019 05/18/2019  PHQ - 2 Score 0 0 0 0  PHQ- 9 Score 0 0 0 -    GAD 7 : Generalized Anxiety Score 05/18/2020  12/03/2019 09/04/2019  Nervous, Anxious, on Edge 0 0 0  Control/stop worrying 0 0 1  Worry too much - different things 0 0 0  Trouble relaxing 0 0 0  Restless 0 0 0  Easily annoyed or irritable 0 0 0  Afraid - awful might happen 0 0 0  Total GAD 7 Score 0 0 1  Anxiety Difficulty Not difficult at all Not difficult at all Not difficult at all    BP Readings from Last 3 Encounters:  05/18/20 104/68  05/02/20 (!) 148/83  03/02/20 118/74    Physical Exam Vitals and nursing note reviewed.  Constitutional:      General: She is not in acute distress.    Appearance: She is well-developed.  HENT:     Head: Normocephalic and atraumatic.  Cardiovascular:     Rate and Rhythm: Normal rate and regular rhythm.  Pulmonary:     Effort: Pulmonary effort is normal. No respiratory distress.     Breath sounds: No wheezing or rhonchi.  Skin:    General: Skin is warm and dry.     Findings: Rash (tinea rash under left breast) present.  Neurological:     Mental Status: She is alert and oriented to person, place, and time.     Gait: Gait abnormal.  Psychiatric:        Attention and Perception: Attention normal.        Mood and Affect: Mood and affect and mood normal.     Wt Readings from Last 3 Encounters:  05/18/20 160 lb (72.6 kg)  05/02/20 160 lb (72.6 kg)  03/02/20 161 lb (73 kg)    BP 104/68   Pulse 91   Ht 4\' 11"  (1.499 m)   Wt 160 lb (72.6 kg)   SpO2 95%   BMI 32.32 kg/m   Assessment and Plan: 1. Tinea corporis Dry skin thoroughly and apply cream twice a day until resolved - nystatin cream (MYCOSTATIN); Apply 1 application topically 2 (two) times daily. To skin under breast  Dispense: 30 g; Refill: 0   Partially dictated using . Any errors are unintentional.  Animal nutritionist, MD [facility]  05/18/2020

## 2020-05-23 ENCOUNTER — Ambulatory Visit: Payer: Medicare Other | Admitting: Urology

## 2020-05-23 NOTE — Progress Notes (Deleted)
PTNS  Session # Monthly Maintenance   Health & Social Factors: *** Caffeine: *** Alcohol: *** Daytime voids #per day: *** Night-time voids #per night: *** Urgency: *** Incontinence Episodes #per day: *** Ankle used: *** Treatment Setting: *** Feeling/ Response: *** Comments: ***  Performed By: ***  Assistant: ***  Follow Up: ***  

## 2020-05-25 NOTE — Patient Instructions (Signed)
Goals Addressed              This Visit's Progress   .  (THN)Learn More About My Health   On track     Timeframe:  Long-Range Goal Priority:  Medium Start Date:      34196222                       Expected End Date:         [id number]             Follow Up Date  [id number] - ask questions - repeat what I heard to make sure I understand - bring a list of my medicines to the visit    Why is this important?   The best way to learn about your health and care is by talking to the doctor and nurse.  They will answer your questions and give you information in the way that you like best.    Notes:     .  [facility] and Keep All Appointments   On track     Timeframe:  Long-Range Goal Priority:  Medium Start Date:    [id number]                         Expected End Date:    14970263                  Follow Up Date [id number]   - call to cancel if needed - keep a calendar with prescription refill dates - keep a calendar with appointment dates    Why is this important?   Part of staying healthy is seeing the doctor for follow-up care.  If you forget your appointments, there are some things you can do to stay on track.    Notes:     .  (THN)Monitor and Manage My Blood Sugar   On track     Timeframe:  Long-Range Goal Priority:  High Start Date: [id number]                             Expected End Date:    67672094                  Follow Up Date [id number]  - check blood sugar at prescribed times - check blood sugar if I feel it is too high or too low - enter blood sugar readings and medication or insulin into daily log - take the blood sugar log to all doctor visits - take the blood sugar meter to all doctor visits    Why is this important?   Checking your blood sugar at home helps to keep it from getting very high or very low.  Writing the results in a diary or log helps the doctor know how to care for you.  Your blood sugar log should have the time, date and the results.  Also, write  down the amount of insulin or other medicine that you take.  Other information, like what you ate, exercise done and how you were feeling, will also be helpful.     Notes:  Fasting blood sugar 158    .  [facility] Eye Exam        Timeframe:  Long-Range Goal Priority:  Medium Start Date:    [id number]  Expected End Date: 78938101               Follow Up Date [id number]  - keep appointment with eye doctor - schedule appointment with eye doctor    Why is this important?   Eye check-ups are important when you have diabetes.  Vision loss can be prevented.    Notes:     .  (THN)Set My Target A1C   On track     Timeframe:  Short-Term Goal Priority:  High Start Date:   27782423                          Expected End Date:  53614431                    Follow Up Date 54008676   - set target A1C    Why is this important?   Your target A1C is decided together by you and your doctor.  It is based on several things like your age and other health issues.    Notes:  7.0    .  (THNPerform Foot Care   On track     Timeframe:  Long-Range Goal Priority:  Medium Start Date:    19509326                         Expected End Date:  71245809                    Follow Up Date [id number]   - check feet daily for cuts, sores or redness - keep feet up while sitting - trim toenails straight across - wash and dry feet carefully every day - wear comfortable, cotton socks - wear comfortable, well-fitting shoes    Why is this important?   Good foot care is very important when you have diabetes.  There are many things you can do to keep your feet healthy and catch a problem early.    Notes:     Marland Kitchen  Diabetes Patient stated goal (pt-stated)   On track     .CARE PLAN ENTRY (see longtitudinal plan of care for additional care plan information)  Objective:  Lab Results  Component Value Date   HGBA1C 7.1 (H) 01/16/2019 .   Lab Results  Component Value Date   CREATININE 0.66  01/16/2019   CREATININE 0.63 09/19/2018   CREATININE 0.77 04/21/2018 .   Marland Kitchen No results found for: EGFR  Current Barriers:  Marland Kitchen Knowledge Deficits related to medications used for management of diabetes  Case Manager Clinical Goal(s):  Over the next 90 days, patient will demonstrate improved adherence to prescribed treatment plan for diabetes self care/management as evidenced by fasting blood sugars being 80-130. . Verbalize daily monitoring and recording of CBG within 90 days . Verbalize adherence to ADA/ carb modified diet within the next 90 days . Verbalize exercise 3 days/week . Verbalize following portion control  Interventions:  . Discussed plans with patient for ongoing care management follow up and provided patient with direct contact information for care management team . Reviewed scheduled/upcoming provider appointments including: A1C blood draws . RN sent educational material Exercise program activity booklet . RN sent educational material on planning healthy meals to help with portion control  Patient Self Care Activities:   Patient verbalized motivation regarding increasing her physical activity by doing chair exercises daily while watching TV and increasing walking up to  2000 steps a day  Patient verbalized portion control when eating and monitoring weight.   RN will follow up outreach within the month of October

## 2020-05-25 NOTE — Patient Outreach (Signed)
[place] [facility]) Care Management  [place]  05/17/2021 Late entry  Sharon York Jul 13, 1945 [id number]  [place] telephone call to patient.  [employer] compliance verified. Per patient she is doing good. Patient fasting blood sugar is 158 today. Patient has been walking daily. She stated she is feeling so much better that she is now able to watch her great gandkids. She is not having any pain. She has received her COVID booster and flu vaccine. She is now drinking mostly cranberry juice, water and ginger ale. Her appetite is good and trying to eat healthy. Patient has agreed to follow up outreach calls.   Encounter Medications:  Outpatient Encounter Medications as of 05/17/2020  Medication Sig  . Accu-Chek Softclix Lancets lancets USE TO CHECK GLUCOSE UP TO 4 TIMES DAILY  . alendronate (FOSAMAX) 70 MG tablet Take 1 tablet (70 mg total) by mouth once a week. Take with a full glass of water on an empty stomach.  Marland Kitchen aspirin 81 MG chewable tablet Chew 1 tablet by mouth daily.  Marland Kitchen atorvastatin (LIPITOR) 10 MG tablet Take 1 tablet by mouth once daily  . baclofen (LIORESAL) 10 MG tablet Take 1 tablet by mouth twice daily  . Calcium Carbonate-Vitamin D 500-125 MG-UNIT TABS Take 1 tablet by mouth daily.   Marland Kitchen lisinopril (ZESTRIL) 2.5 MG tablet Take 1 tablet (2.5 mg total) by mouth daily.  . metFORMIN (GLUCOPHAGE-XR) 500 MG 24 hr tablet TAKE 1 TABLET BY MOUTH WITH BREAKFAST  . Multiple Vitamins-Minerals (MULTIVITAMIN ADULT PO) Take by mouth.   No facility-administered encounter medications on file as of 05/17/2020.    Functional Status:  In your present state of health, do you have any difficulty performing the following activities: 12/01/2019  Hearing? Y  Comment patient has bilateral hearing aids  Vision? N  Difficulty concentrating or making decisions? N  Walking or climbing stairs? Y  Comment uses a cane and hemiparesis  Dressing or bathing? Y  Comment yes  daughter assists  Doing errands, shopping? N  Preparing Food and eating ? N  Using the Toilet? N  In the past six months, have you accidently leaked urine? N  Do you have problems with loss of bowel control? N  Managing your Medications? N  Managing your Finances? Y  Comment daughter assists  Housekeeping or managing your Housekeeping? Y  Comment daughter assists/ Patient lives with daughter  Some recent data might be hidden    Fall/Depression Screening: Fall Risk  05/18/2020 05/17/2020 03/03/2020  Falls in the past year? 0 0 1  Comment - - -  Number falls in past yr: 0 0 0  Injury with Fall? 0 0 0  Risk Factor Category  - - -  Risk for fall due to : - History of fall(s);Impaired balance/gait;Impaired mobility History of fall(s);Impaired balance/gait;Impaired mobility  Risk for fall due to: Comment - - -  Follow up Falls evaluation completed Falls evaluation completed Falls evaluation completed   PHQ 2/9 Scores 05/18/2020 12/03/2019 09/04/2019 05/18/2019 03/11/2019 01/16/2019 01/16/2019  PHQ - 2 Score 0 0 0 0 0 0 0  PHQ- 9 Score 0 0 0 - - - 0    Assessment:  Goals Addressed              This Visit's Progress   .  (THN)Learn More About My Health   On track     Timeframe:  Long-Range Goal Priority:  Medium Start Date:      25498264  Expected End Date:         16384536             Follow Up Date  [id number] - ask questions - repeat what I heard to make sure I understand - bring a list of my medicines to the visit    Why is this important?   The best way to learn about your health and care is by talking to the doctor and nurse.  They will answer your questions and give you information in the way that you like best.    Notes:     .  [facility] and Keep All Appointments   On track     Timeframe:  Long-Range Goal Priority:  Medium Start Date:    24825003                         Expected End Date:    [id number]                  Follow Up Date 69450388    - call to cancel if needed - keep a calendar with prescription refill dates - keep a calendar with appointment dates    Why is this important?   Part of staying healthy is seeing the doctor for follow-up care.  If you forget your appointments, there are some things you can do to stay on track.    Notes:     .  (THN)Monitor and Manage My Blood Sugar   On track     Timeframe:  Long-Range Goal Priority:  High Start Date: 82800349                             Expected End Date:    17915056                  Follow Up Date 97948016  - check blood sugar at prescribed times - check blood sugar if I feel it is too high or too low - enter blood sugar readings and medication or insulin into daily log - take the blood sugar log to all doctor visits - take the blood sugar meter to all doctor visits    Why is this important?   Checking your blood sugar at home helps to keep it from getting very high or very low.  Writing the results in a diary or log helps the doctor know how to care for you.  Your blood sugar log should have the time, date and the results.  Also, write down the amount of insulin or other medicine that you take.  Other information, like what you ate, exercise done and how you were feeling, will also be helpful.     Notes:  Fasting blood sugar 158    .  [facility],The Eye Exam        Timeframe:  Long-Range Goal Priority:  Medium Start Date:    55374827                         Expected End Date: 07867544               Follow Up Date 92010071  - keep appointment with eye doctor - schedule appointment with eye doctor    Why is this important?   Eye check-ups are important when you have diabetes.  Vision loss can be prevented.  Notes:     .  (THN)Set My Target A1C   On track     Timeframe:  Short-Term Goal Priority:  High Start Date:   93235573                          Expected End Date:  22025427                    Follow Up Date 06237628   - set target A1C     Why is this important?   Your target A1C is decided together by you and your doctor.  It is based on several things like your age and other health issues.    Notes:  7.0    .  (THNPerform Foot Care   On track     Timeframe:  Long-Range Goal Priority:  Medium Start Date:    31517616                         Expected End Date:  07371062                    Follow Up Date 69485462   - check feet daily for cuts, sores or redness - keep feet up while sitting - trim toenails straight across - wash and dry feet carefully every day - wear comfortable, cotton socks - wear comfortable, well-fitting shoes    Why is this important?   Good foot care is very important when you have diabetes.  There are many things you can do to keep your feet healthy and catch a problem early.    Notes:     Marland Kitchen  Diabetes Patient stated goal (pt-stated)   On track     .CARE PLAN ENTRY (see longtitudinal plan of care for additional care plan information)  Objective:  Lab Results  Component Value Date   HGBA1C 7.1 (H) 01/16/2019 .   Lab Results  Component Value Date   CREATININE 0.66 01/16/2019   CREATININE 0.63 09/19/2018   CREATININE 0.77 04/21/2018 .   Marland Kitchen No results found for: EGFR  Current Barriers:  Marland Kitchen Knowledge Deficits related to medications used for management of diabetes  Case Manager Clinical Goal(s):  Over the next 90 days, patient will demonstrate improved adherence to prescribed treatment plan for diabetes self care/management as evidenced by fasting blood sugars being 80-130. . Verbalize daily monitoring and recording of CBG within 90 days . Verbalize adherence to ADA/ carb modified diet within the next 90 days . Verbalize exercise 3 days/week . Verbalize following portion control  Interventions:  . Discussed plans with patient for ongoing care management follow up and provided patient with direct contact information for care management team . Reviewed scheduled/upcoming provider  appointments including: A1C blood draws . RN sent educational material Exercise program activity booklet . RN sent educational material on planning healthy meals to help with portion control  Patient Self Care Activities:   Patient verbalized motivation regarding increasing her physical activity by doing chair exercises daily while watching TV and increasing walking up to 2000 steps a day  Patient verbalized portion control when eating and monitoring weight.   RN will follow up outreach within the month of October        Plan:  Follow-up:  Patient agrees to Care Plan and Follow-up. Patient will continue to do her exercise routine Patient will continue to monitor and document CBG  Provided a calendar book for documentation RN sent update assessment to PCP RN will follow up outreach calls within the month of March  Keltin Baird [place] Management [phone]

## 2020-05-29 NOTE — Progress Notes (Signed)
PTNS  Session # Monthly Maintenance   Health & Social Factors: No Change Caffeine: None Alcohol: None Daytime voids #per day: 7 Night-time voids #per night: 1-2 Urgency: Strong Incontinence Episodes #per day: None Ankle used: right  Treatment Setting: 6 Feeling/ Response: sensory Comments: Patient tolerated the procedure well  Performed By: Michiel Cowboy, PA-C  Assistant: Baltazar Apo, LPN  Follow Up: One month for maintenace

## 2020-05-30 ENCOUNTER — Encounter: Payer: Self-pay | Admitting: Urology

## 2020-05-30 ENCOUNTER — Ambulatory Visit (INDEPENDENT_AMBULATORY_CARE_PROVIDER_SITE_OTHER): Payer: Medicare Other | Admitting: Urology

## 2020-05-30 VITALS — BP 110/67 | HR 80 | Ht <= 58 in | Wt 160.0 lb

## 2020-05-30 DIAGNOSIS — R350 Frequency of micturition: Secondary | ICD-10-CM

## 2020-06-13 ENCOUNTER — Other Ambulatory Visit: Payer: Self-pay | Admitting: Internal Medicine

## 2020-06-13 DIAGNOSIS — I69354 Hemiplegia and hemiparesis following cerebral infarction affecting left non-dominant side: Secondary | ICD-10-CM

## 2020-06-14 ENCOUNTER — Other Ambulatory Visit: Payer: Self-pay

## 2020-06-14 ENCOUNTER — Telehealth: Payer: Self-pay | Admitting: Internal Medicine

## 2020-06-14 MED ORDER — ACCU-CHEK AVIVA PLUS VI STRP: 1.0000 | ORAL_STRIP | Freq: Four times a day (QID) | 12 refills | Status: DC

## 2020-06-14 NOTE — Telephone Encounter (Signed)
The correct pharm is [facility] [address]

## 2020-06-14 NOTE — Telephone Encounter (Signed)
Sent to [facility] in [place]. °

## 2020-06-14 NOTE — Telephone Encounter (Signed)
Patient requesting Accu-Chek Aviva Plus test strips not on current list

## 2020-06-20 NOTE — Telephone Encounter (Signed)
Called pt let her know that she would have to order her pogo test strips. Gave pt the number to call to order them. We have no way to order them thru our system. # [phone]. Pt verbalized understanding.  KP

## 2020-06-20 NOTE — Telephone Encounter (Signed)
Pt called stating that these test strips were actually supposed to be for her POGO meter. Please advise.

## 2020-07-03 NOTE — Progress Notes (Signed)
Error

## 2020-07-04 ENCOUNTER — Ambulatory Visit: Payer: Medicare Other | Admitting: Urology

## 2020-07-04 ENCOUNTER — Ambulatory Visit (INDEPENDENT_AMBULATORY_CARE_PROVIDER_SITE_OTHER): Payer: Medicare Other | Admitting: Urology

## 2020-07-04 ENCOUNTER — Ambulatory Visit (INDEPENDENT_AMBULATORY_CARE_PROVIDER_SITE_OTHER): Payer: Medicare Other | Admitting: Internal Medicine

## 2020-07-04 ENCOUNTER — Other Ambulatory Visit: Payer: Self-pay

## 2020-07-04 ENCOUNTER — Encounter: Payer: Self-pay | Admitting: Internal Medicine

## 2020-07-04 VITALS — BP 124/84 | HR 76 | Ht <= 58 in | Wt 161.0 lb

## 2020-07-04 DIAGNOSIS — R350 Frequency of micturition: Secondary | ICD-10-CM

## 2020-07-04 DIAGNOSIS — H6121 Impacted cerumen, right ear: Secondary | ICD-10-CM | POA: Diagnosis not present

## 2020-07-04 NOTE — Progress Notes (Signed)
Patient ID: Sharon York, female   DOB: [birth year], 75 y.o.   MRN: [id number] PTNS  Session # Monthly  Health & Social Factors: no change Caffeine: none Alcohol: none Daytime voids #per day: 6-7 Night-time voids #per night: 1-2 Urgency: mild Incontinence Episodes #per day: 0 Ankle used: right Treatment Setting: 7 Feeling/ Response: both Comments: pt tolerated well  Performed By: Mervin Hack, CMA  Assistant:   Follow Up: monthly

## 2020-07-04 NOTE — Progress Notes (Signed)
Date:  07/04/2020   Name:  Sharon York   DOB:  1945/05/26   MRN:  [id number]   Chief Complaint: Ear Fullness (Patient believes she has wax build up in her ears. Hearing echos in Right ear.)  Otalgia  There is pain in the right ear. The problem occurs constantly. The problem has been unchanged. There has been no fever. The patient is experiencing no pain (but feels full - audiologist confirmed wax last week). Associated symptoms include hearing loss. She has tried nothing for the symptoms.    Lab Results  Component Value Date   CREATININE 0.74 12/03/2019   BUN 11 12/03/2019   NA 142 12/03/2019   K 4.5 12/03/2019   CL 99 12/03/2019   CO2 29 12/03/2019   Lab Results  Component Value Date   CHOL 171 12/03/2019   HDL 63 12/03/2019   LDLCALC 85 12/03/2019   TRIG 135 12/03/2019   CHOLHDL 2.7 12/03/2019   Lab Results  Component Value Date   TSH 3.740 01/16/2019   Lab Results  Component Value Date   HGBA1C 7.0 (H) 03/02/2020   Lab Results  Component Value Date   WBC 5.9 01/16/2019   HGB 12.1 01/16/2019   HCT 38.9 01/16/2019   MCV 74 (L) 01/16/2019   PLT 275 01/16/2019   Lab Results  Component Value Date   ALT 13 12/03/2019   AST 24 12/03/2019   ALKPHOS 51 12/03/2019   BILITOT 0.3 12/03/2019     Review of Systems  Constitutional: Negative for chills and fever.  HENT: Positive for ear pain (fullness), hearing loss and tinnitus.   Respiratory: Negative for chest tightness and shortness of breath.   Neurological: Negative for dizziness.    Patient Active Problem List   Diagnosis Date Noted  . Microalbuminuria due to type 2 diabetes mellitus (HCC) 12/07/2019  . Type II diabetes mellitus with complication (HCC) 04/21/2018  . Right shoulder pain 03/31/2018  . Hyperlipidemia associated with type 2 diabetes mellitus (HCC) 06/12/2017  . Tinea corporis 03/17/2015  . Abnormal gait 12/15/2014  . Altered bowel function 12/15/2014  . Hemiparesis affecting left side  as late effect of cerebrovascular accident (CVA) (HCC) 12/15/2014  . Obstructive sleep apnea of adult 12/15/2014  . Arthritis of shoulder region, degenerative 12/15/2014  . OP (osteoporosis) 12/15/2014  . Allergic rhinitis, seasonal 12/15/2014  . Mixed incontinence 03/11/2013    Allergies  Allergen Reactions  . Nitrofurantoin Nausea Only  . Iodinated Diagnostic Agents Hives and Cough    Patient developed a hive on her left lip after injection as well as some coughing.     Past Surgical History:  Procedure Laterality Date  . CESAREAN SECTION  1975  . COLONOSCOPY  2010   normal    Social History   Tobacco Use  . Smoking status: Never Smoker  . Smokeless tobacco: Never Used  . Tobacco comment: smoking cessation materials not required  Vaping Use  . Vaping Use: Never used  Substance Use Topics  . Alcohol use: No    Alcohol/week: 0.0 standard drinks  . Drug use: Never     Medication list has been reviewed and updated.  Current Meds  Medication Sig  . ACCU-CHEK AVIVA PLUS test strip 1 each by Other route 4 (four) times daily.  . Accu-Chek Softclix Lancets lancets USE TO CHECK GLUCOSE UP TO 4 TIMES DAILY  . alendronate (FOSAMAX) 70 MG tablet Take 1 tablet (70 mg total) by mouth once a week.  Take with a full glass of water on an empty stomach.  Marland Kitchen aspirin 81 MG chewable tablet Chew 1 tablet by mouth daily.  Marland Kitchen atorvastatin (LIPITOR) 10 MG tablet Take 1 tablet by mouth once daily  . baclofen (LIORESAL) 10 MG tablet Take 1 tablet by mouth twice daily  . Calcium Carbonate-Vitamin D 500-125 MG-UNIT TABS Take 1 tablet by mouth daily.   Marland Kitchen lisinopril (ZESTRIL) 2.5 MG tablet Take 1 tablet (2.5 mg total) by mouth daily.  . metFORMIN (GLUCOPHAGE-XR) 500 MG 24 hr tablet TAKE 1 TABLET BY MOUTH WITH BREAKFAST  . Multiple Vitamins-Minerals (MULTIVITAMIN ADULT PO) Take by mouth.  . nystatin cream (MYCOSTATIN) Apply 1 application topically 2 (two) times daily. To skin under breast    PHQ  2/9 Scores 07/04/2020 05/18/2020 12/03/2019 09/04/2019  PHQ - 2 Score 0 0 0 0  PHQ- 9 Score 0 0 0 0    GAD 7 : Generalized Anxiety Score 07/04/2020 05/18/2020 12/03/2019 09/04/2019  Nervous, Anxious, on Edge 0 0 0 0  Control/stop worrying 0 0 0 1  Worry too much - different things 0 0 0 0  Trouble relaxing 0 0 0 0  Restless 0 0 0 0  Easily annoyed or irritable 0 0 0 0  Afraid - awful might happen 0 0 0 0  Total GAD 7 Score 0 0 0 1  Anxiety Difficulty Not difficult at all Not difficult at all Not difficult at all Not difficult at all    BP Readings from Last 3 Encounters:  07/04/20 124/84  05/30/20 110/67  05/18/20 104/68    Physical Exam Vitals and nursing note reviewed.  Constitutional:      General: She is not in acute distress.    Appearance: Normal appearance. She is well-developed.  HENT:     Head: Normocephalic and atraumatic.     Right Ear: Hearing normal. There is impacted cerumen.     Ears:     Comments: Moderate cerumen on left Pulmonary:     Effort: Pulmonary effort is normal. No respiratory distress.  Musculoskeletal:        General: Normal range of motion.  Skin:    General: Skin is warm and dry.     Findings: No rash.  Neurological:     Mental Status: She is alert and oriented to person, place, and time.  Psychiatric:        Mood and Affect: Mood and affect normal.        Behavior: Behavior normal.        Thought Content: Thought content normal.     Wt Readings from Last 3 Encounters:  07/04/20 161 lb (73 kg)  05/30/20 160 lb (72.6 kg)  05/18/20 160 lb (72.6 kg)    BP 124/84   Pulse 76   Ht 4\' 10"  (1.473 m)   Wt 161 lb (73 kg)   SpO2 97%   BMI 33.65 kg/m   Assessment and Plan: 1. Impacted cerumen of right ear Right ear flushed with tepid water which loosened the cerumen A large amount of cerumen was able to be removed with the curette - pt tolerated well   Partially dictated using . Any errors are unintentional.  Animal nutritionist, MD [facility]  07/04/2020

## 2020-07-06 ENCOUNTER — Encounter: Payer: Self-pay | Admitting: Internal Medicine

## 2020-07-06 ENCOUNTER — Ambulatory Visit (INDEPENDENT_AMBULATORY_CARE_PROVIDER_SITE_OTHER): Payer: Medicare Other | Admitting: Internal Medicine

## 2020-07-06 ENCOUNTER — Other Ambulatory Visit: Payer: Self-pay

## 2020-07-06 VITALS — BP 124/82 | HR 68 | Ht <= 58 in | Wt 161.0 lb

## 2020-07-06 DIAGNOSIS — I69354 Hemiplegia and hemiparesis following cerebral infarction affecting left non-dominant side: Secondary | ICD-10-CM

## 2020-07-06 DIAGNOSIS — E118 Type 2 diabetes mellitus with unspecified complications: Secondary | ICD-10-CM | POA: Diagnosis not present

## 2020-07-06 LAB — POCT GLYCOSYLATED HEMOGLOBIN (HGB A1C): Hemoglobin A1C: 6.4 % — AB (ref 4.0–5.6)

## 2020-07-06 MED ORDER — BACLOFEN 10 MG PO TABS: 10.0000 mg | ORAL_TABLET | Freq: Two times a day (BID) | ORAL | 1 refills | Status: DC

## 2020-07-06 NOTE — Progress Notes (Signed)
Date:  07/06/2020   Name:  Sharon York   DOB:  01/22/1946   MRN:  [id number]   Chief Complaint: Diabetes (4 month follow up. 123 Before breakfast this morning.)  Diabetes She presents for her follow-up diabetic visit. She has type 2 diabetes mellitus. Her disease course has been stable. Pertinent negatives for hypoglycemia include no dizziness, headaches or nervousness/anxiousness. (2 episodes of BS < 70 on metformin ER 500 mg daily) Associated symptoms include weakness. Pertinent negatives for diabetes include no chest pain and no fatigue. She monitors blood glucose at home 1-2 x per day. Home blood sugar record trend: runs low after taking meds and eating. Her breakfast blood glucose is taken between 6-7 am. Her breakfast blood glucose range is generally 90-110 mg/dl.    Lab Results  Component Value Date   CREATININE 0.74 12/03/2019   BUN 11 12/03/2019   NA 142 12/03/2019   K 4.5 12/03/2019   CL 99 12/03/2019   CO2 29 12/03/2019   Lab Results  Component Value Date   CHOL 171 12/03/2019   HDL 63 12/03/2019   LDLCALC 85 12/03/2019   TRIG 135 12/03/2019   CHOLHDL 2.7 12/03/2019   Lab Results  Component Value Date   TSH 3.740 01/16/2019   Lab Results  Component Value Date   HGBA1C 7.0 (H) 03/02/2020   Lab Results  Component Value Date   WBC 5.9 01/16/2019   HGB 12.1 01/16/2019   HCT 38.9 01/16/2019   MCV 74 (L) 01/16/2019   PLT 275 01/16/2019   Lab Results  Component Value Date   ALT 13 12/03/2019   AST 24 12/03/2019   ALKPHOS 51 12/03/2019   BILITOT 0.3 12/03/2019     Review of Systems  Constitutional: Negative for chills, fatigue and fever.  Respiratory: Negative for cough, chest tightness and shortness of breath.   Cardiovascular: Negative for chest pain, palpitations and leg swelling.  Genitourinary: Positive for urgency (improved with TNS treatments).  Musculoskeletal: Positive for gait problem.  Neurological: Positive for weakness. Negative for  dizziness and headaches.  Psychiatric/Behavioral: Negative for dysphoric mood and sleep disturbance. The patient is not nervous/anxious.     Patient Active Problem List   Diagnosis Date Noted  . Microalbuminuria due to type 2 diabetes mellitus (HCC) 12/07/2019  . Type II diabetes mellitus with complication (HCC) 04/21/2018  . Right shoulder pain 03/31/2018  . Hyperlipidemia associated with type 2 diabetes mellitus (HCC) 06/12/2017  . Tinea corporis 03/17/2015  . Abnormal gait 12/15/2014  . Altered bowel function 12/15/2014  . Hemiparesis affecting left side as late effect of cerebrovascular accident (CVA) (HCC) 12/15/2014  . Obstructive sleep apnea of adult 12/15/2014  . Arthritis of shoulder region, degenerative 12/15/2014  . OP (osteoporosis) 12/15/2014  . Allergic rhinitis, seasonal 12/15/2014  . Mixed incontinence 03/11/2013    Allergies  Allergen Reactions  . Nitrofurantoin Nausea Only  . Iodinated Diagnostic Agents Hives and Cough    Patient developed a hive on her left lip after injection as well as some coughing.     Past Surgical History:  Procedure Laterality Date  . CESAREAN SECTION  1975  . COLONOSCOPY  2010   normal    Social History   Tobacco Use  . Smoking status: Never Smoker  . Smokeless tobacco: Never Used  . Tobacco comment: smoking cessation materials not required  Vaping Use  . Vaping Use: Never used  Substance Use Topics  . Alcohol use: No  Alcohol/week: 0.0 standard drinks  . Drug use: Never     Medication list has been reviewed and updated.  Current Meds  Medication Sig  . ACCU-CHEK AVIVA PLUS test strip 1 each by Other route 4 (four) times daily.  . Accu-Chek Softclix Lancets lancets USE TO CHECK GLUCOSE UP TO 4 TIMES DAILY  . alendronate (FOSAMAX) 70 MG tablet Take 1 tablet (70 mg total) by mouth once a week. Take with a full glass of water on an empty stomach.  Marland Kitchen aspirin 81 MG chewable tablet Chew 1 tablet by mouth daily.  Marland Kitchen  atorvastatin (LIPITOR) 10 MG tablet Take 1 tablet by mouth once daily  . baclofen (LIORESAL) 10 MG tablet Take 1 tablet by mouth twice daily  . Calcium Carbonate-Vitamin D 500-125 MG-UNIT TABS Take 1 tablet by mouth daily.   Marland Kitchen lisinopril (ZESTRIL) 2.5 MG tablet Take 1 tablet (2.5 mg total) by mouth daily.  . metFORMIN (GLUCOPHAGE-XR) 500 MG 24 hr tablet TAKE 1 TABLET BY MOUTH WITH BREAKFAST  . Multiple Vitamins-Minerals (MULTIVITAMIN ADULT PO) Take by mouth.  . nystatin cream (MYCOSTATIN) Apply 1 application topically 2 (two) times daily. To skin under breast    PHQ 2/9 Scores 07/06/2020 07/04/2020 05/18/2020 12/03/2019  PHQ - 2 Score 0 0 0 0  PHQ- 9 Score 0 0 0 0    GAD 7 : Generalized Anxiety Score 07/06/2020 07/04/2020 05/18/2020 12/03/2019  Nervous, Anxious, on Edge 0 0 0 0  Control/stop worrying 0 0 0 0  Worry too much - different things 0 0 0 0  Trouble relaxing 0 0 0 0  Restless 0 0 0 0  Easily annoyed or irritable 0 0 0 0  Afraid - awful might happen 0 0 0 0  Total GAD 7 Score 0 0 0 0  Anxiety Difficulty Not difficult at all Not difficult at all Not difficult at all Not difficult at all    BP Readings from Last 3 Encounters:  07/06/20 124/82  07/04/20 124/84  05/30/20 110/67    Physical Exam Vitals and nursing note reviewed.  Constitutional:      General: She is not in acute distress.    Appearance: She is well-developed.  HENT:     Head: Normocephalic and atraumatic.  Cardiovascular:     Rate and Rhythm: Normal rate and regular rhythm.     Pulses: Normal pulses.  Pulmonary:     Effort: Pulmonary effort is normal. No respiratory distress.     Breath sounds: No wheezing or rhonchi.  Musculoskeletal:     Cervical back: Normal range of motion.     Right lower leg: No edema.     Left lower leg: No edema.  Lymphadenopathy:     Cervical: No cervical adenopathy.  Skin:    General: Skin is warm and dry.     Findings: No rash.  Neurological:     Mental Status: She is  alert and oriented to person, place, and time. Mental status is at baseline.     Gait: Gait abnormal.     Comments: LUE hemiparesis LLE weakness with foot drop  Psychiatric:        Behavior: Behavior normal.        Thought Content: Thought content normal.     Wt Readings from Last 3 Encounters:  07/06/20 161 lb (73 kg)  07/04/20 161 lb (73 kg)  05/30/20 160 lb (72.6 kg)    BP 124/82   Pulse 68   Ht 4\' 10"  (  1.473 m)   Wt 161 lb (73 kg)   SpO2 97%   BMI 33.65 kg/m   Assessment and Plan: 1. Type II diabetes mellitus with complication (HCC) BS controlled with frequent low blood sugars Will discontinue metformin - consider 250 mg daily if BS spike - POCT glycosylated hemoglobin (Hb A1C) = 6.4  2. Hemiparesis affecting left side as late effect of cerebrovascular accident (CVA) (HCC) - baclofen (LIORESAL) 10 MG tablet; Take 1 tablet (10 mg total) by mouth 2 (two) times daily.  Dispense: 180 tablet; Refill: 1   Partially dictated using Animal nutritionist. Any errors are unintentional.  Bari Edward, MD [facility]  07/06/2020

## 2020-07-06 NOTE — Patient Instructions (Signed)
Stop metformin but continue to check your blood sugar - especially after breakfast.  Call if the readings go up over 200.

## 2020-07-11 ENCOUNTER — Ambulatory Visit (INDEPENDENT_AMBULATORY_CARE_PROVIDER_SITE_OTHER): Payer: Medicare Other

## 2020-07-11 DIAGNOSIS — Z0000 Encounter for general adult medical examination without abnormal findings: Secondary | ICD-10-CM | POA: Diagnosis not present

## 2020-07-11 DIAGNOSIS — Z780 Asymptomatic menopausal state: Secondary | ICD-10-CM | POA: Diagnosis not present

## 2020-07-11 NOTE — Patient Instructions (Signed)
Sharon York , Thank you for taking time to come for your Medicare Wellness Visit. I appreciate your ongoing commitment to your health goals. Please review the following plan we discussed and let me know if I can assist you in the future.   Screening recommendations/referrals: Colonoscopy: no longer required Mammogram: done 03/09/20 Bone Density: done 02/04/18 Recommended yearly ophthalmology/optometry visit for glaucoma screening and checkup Recommended yearly dental visit for hygiene and checkup  Vaccinations: Influenza vaccine: done 02/11/20 Pneumococcal vaccine: done 06/11/17 Tdap vaccine: done 07/10/18 Shingles vaccine: Shingrix discussed. Please contact your pharmacy for coverage information.  Covid-19: done 06/01/19, 06/22/19 & 02/25/20  Conditions/risks identified: Recommend continuing fall prevention in the home  Next appointment: Follow up in one year for your annual wellness visit    Preventive Care 65 Years and Older, Female Preventive care refers to lifestyle choices and visits with your health care provider that can promote health and wellness. What does preventive care include?  A yearly physical exam. This is also called an annual well check.  Dental exams once or twice a year.  Routine eye exams. Ask your health care provider how often you should have your eyes checked.  Personal lifestyle choices, including:  Daily care of your teeth and gums.  Regular physical activity.  Eating a healthy diet.  Avoiding tobacco and drug use.  Limiting alcohol use.  Practicing safe sex.  Taking low-dose aspirin every day.  Taking vitamin and mineral supplements as recommended by your health care provider. What happens during an annual well check? The services and screenings done by your health care provider during your annual well check will depend on your age, overall health, lifestyle risk factors, and family history of disease. Counseling  Your health care provider may  ask you questions about your:  Alcohol use.  Tobacco use.  Drug use.  Emotional well-being.  Home and relationship well-being.  Sexual activity.  Eating habits.  History of falls.  Memory and ability to understand (cognition).  Work and work Astronomer.  Reproductive health. Screening  You may have the following tests or measurements:  Height, weight, and BMI.  Blood pressure.  Lipid and cholesterol levels. These may be checked every 5 years, or more frequently if you are over 55 years old.  Skin check.  Lung cancer screening. You may have this screening every year starting at age 24 if you have a 30-pack-year history of smoking and currently smoke or have quit within the past 15 years.  Fecal occult blood test (FOBT) of the stool. You may have this test every year starting at age 77.  Flexible sigmoidoscopy or colonoscopy. You may have a sigmoidoscopy every 5 years or a colonoscopy every 10 years starting at age 38.  Hepatitis C blood test.  Hepatitis B blood test.  Sexually transmitted disease (STD) testing.  Diabetes screening. This is done by checking your blood sugar (glucose) after you have not eaten for a while (fasting). You may have this done every 1-3 years.  Bone density scan. This is done to screen for osteoporosis. You may have this done starting at age 77.  Mammogram. This may be done every 1-2 years. Talk to your health care provider about how often you should have regular mammograms. Talk with your health care provider about your test results, treatment options, and if necessary, the need for more tests. Vaccines  Your health care provider may recommend certain vaccines, such as:  Influenza vaccine. This is recommended every year.  Tetanus, diphtheria,  and acellular pertussis (Tdap, Td) vaccine. You may need a Td booster every 10 years.  Zoster vaccine. You may need this after age 4.  Pneumococcal 13-valent conjugate (PCV13) vaccine. One  dose is recommended after age 75.  Pneumococcal polysaccharide (PPSV23) vaccine. One dose is recommended after age 77. Talk to your health care provider about which screenings and vaccines you need and how often you need them. This information is not intended to replace advice given to you by your health care provider. Make sure you discuss any questions you have with your health care provider. Document Released: 06/03/2015 Document Revised: 01/25/2016 Document Reviewed: 03/08/2015 Elsevier Interactive Patient Education  2017 [place] Prevention in the Home Falls can cause injuries. They can happen to people of all ages. There are many things you can do to make your home safe and to help prevent falls. What can I do on the outside of my home?  Regularly fix the edges of walkways and driveways and fix any cracks.  Remove anything that might make you trip as you walk through a door, such as a raised step or threshold.  Trim any bushes or trees on the path to your home.  Use bright outdoor lighting.  Clear any walking paths of anything that might make someone trip, such as rocks or tools.  Regularly check to see if handrails are loose or broken. Make sure that both sides of any steps have handrails.  Any raised decks and porches should have guardrails on the edges.  Have any leaves, snow, or ice cleared regularly.  Use sand or salt on walking paths during winter.  Clean up any spills in your garage right away. This includes oil or grease spills. What can I do in the bathroom?  Use night lights.  Install grab bars by the toilet and in the tub and shower. Do not use towel bars as grab bars.  Use non-skid mats or decals in the tub or shower.  If you need to sit down in the shower, use a plastic, non-slip stool.  Keep the floor dry. Clean up any water that spills on the floor as soon as it happens.  Remove soap buildup in the tub or shower regularly.  Attach bath  mats securely with double-sided non-slip rug tape.  Do not have throw rugs and other things on the floor that can make you trip. What can I do in the bedroom?  Use night lights.  Make sure that you have a light by your bed that is easy to reach.  Do not use any sheets or blankets that are too big for your bed. They should not hang down onto the floor.  Have a firm chair that has side arms. You can use this for support while you get dressed.  Do not have throw rugs and other things on the floor that can make you trip. What can I do in the kitchen?  Clean up any spills right away.  Avoid walking on wet floors.  Keep items that you use a lot in easy-to-reach places.  If you need to reach something above you, use a strong step stool that has a grab bar.  Keep electrical cords out of the way.  Do not use floor polish or wax that makes floors slippery. If you must use wax, use non-skid floor wax.  Do not have throw rugs and other things on the floor that can make you trip. What can I do with my  stairs?  Do not leave any items on the stairs.  Make sure that there are handrails on both sides of the stairs and use them. Fix handrails that are broken or loose. Make sure that handrails are as long as the stairways.  Check any carpeting to make sure that it is firmly attached to the stairs. Fix any carpet that is loose or worn.  Avoid having throw rugs at the top or bottom of the stairs. If you do have throw rugs, attach them to the floor with carpet tape.  Make sure that you have a light switch at the top of the stairs and the bottom of the stairs. If you do not have them, ask someone to add them for you. What else can I do to help prevent falls?  Wear shoes that:  Do not have high heels.  Have rubber bottoms.  Are comfortable and fit you well.  Are closed at the toe. Do not wear sandals.  If you use a stepladder:  Make sure that it is fully opened. Do not climb a closed  stepladder.  Make sure that both sides of the stepladder are locked into place.  Ask someone to hold it for you, if possible.  Clearly mark and make sure that you can see:  Any grab bars or handrails.  First and last steps.  Where the edge of each step is.  Use tools that help you move around (mobility aids) if they are needed. These include:  Canes.  Walkers.  Scooters.  Crutches.  Turn on the lights when you go into a dark area. Replace any light bulbs as soon as they burn out.  Set up your furniture so you have a clear path. Avoid moving your furniture around.  If any of your floors are uneven, fix them.  If there are any pets around you, be aware of where they are.  Review your medicines with your doctor. Some medicines can make you feel dizzy. This can increase your chance of falling. Ask your doctor what other things that you can do to help prevent falls. This information is not intended to replace advice given to you by your health care provider. Make sure you discuss any questions you have with your health care provider. Document Released: 03/03/2009 Document Revised: 10/13/2015 Document Reviewed: 06/11/2014 Elsevier Interactive Patient Education  2017 [employer].

## 2020-07-11 NOTE — Progress Notes (Signed)
Subjective:   Sharon York is a 75 y.o. female who presents for Medicare Annual (Subsequent) preventive examination.  Virtual Visit via Telephone Note  I connected with  Sharon York on 07/11/20 at  3:20 PM EST by telephone and verified that I am speaking with the correct person using two identifiers.  Location: Patient: home Provider: [facility] Persons participating in the virtual visit: [place]   I discussed the limitations, risks, security and privacy concerns of performing an evaluation and management service by telephone and the availability of in person appointments. The patient expressed understanding and agreed to proceed.  Interactive audio and video telecommunications were attempted between this nurse and patient, however failed, due to patient having technical difficulties OR patient did not have access to video capability.  We continued and completed visit with audio only.  Some vital signs may be absent or patient reported.   Clemetine Marker, LPN    Review of Systems     Cardiac Risk Factors include: advanced age (>34mn, >>67women);diabetes mellitus;dyslipidemia;hypertension;sedentary lifestyle;obesity (BMI >30kg/m2)     Objective:    There were no vitals filed for this visit. There is no height or weight on file to calculate BMI.  Advanced Directives 07/11/2020 12/15/2019 09/29/2019 06/29/2019 01/16/2019 01/12/2019 09/10/2018  Does Patient Have a Medical Advance Directive? Yes No Yes No Yes Yes No  Type of AParamedicof [place]Living will - [place] - - [place] will -  Does patient want to make changes to medical advance directive? - - - - No - Patient declined - -  Copy of [place] Chart? Yes - validated most recent copy scanned in chart (See row information) - - - - No - copy requested -  Would patient like information on creating a medical  advance directive? - - - - - - -    Current Medications (verified) Outpatient Encounter Medications as of 07/11/2020  Medication Sig  . ACCU-CHEK AVIVA PLUS test strip 1 each by Other route 4 (four) times daily.  . Accu-Chek Softclix Lancets lancets USE TO CHECK GLUCOSE UP TO 4 TIMES DAILY  . alendronate (FOSAMAX) 70 MG tablet Take 1 tablet (70 mg total) by mouth once a week. Take with a full glass of water on an empty stomach.  .Marland Kitchenaspirin 81 MG chewable tablet Chew 1 tablet by mouth daily.  .Marland Kitchenatorvastatin (LIPITOR) 10 MG tablet Take 1 tablet by mouth once daily  . baclofen (LIORESAL) 10 MG tablet Take 1 tablet (10 mg total) by mouth 2 (two) times daily.  . Calcium Carbonate-Vitamin D 500-125 MG-UNIT TABS Take 1 tablet by mouth daily.   .Marland Kitchenlisinopril (ZESTRIL) 2.5 MG tablet Take 1 tablet (2.5 mg total) by mouth daily.  . Multiple Vitamins-Minerals (MULTIVITAMIN ADULT PO) Take by mouth.  . nystatin cream (MYCOSTATIN) Apply 1 application topically 2 (two) times daily. To skin under breast   No facility-administered encounter medications on file as of 07/11/2020.    Allergies (verified) Nitrofurantoin and Iodinated diagnostic agents   History: Past Medical History:  Diagnosis Date  . Absence of bladder continence 12/15/2014  . Blood pressure elevated without history of HTN 12/15/2014  . Diabetes mellitus without complication ([place]   . Hyperlipidemia   . Osteoporosis   . Overactive bladder   . PMB (postmenopausal bleeding) 11/01/2017  . 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 CESAREAN SECTION  1975  . COLONOSCOPY  2010   normal   Family History  Problem Relation Age of Onset  . Diabetes Mother   . Thyroid disease Mother   . Breast cancer Maternal Aunt   . Breast cancer Cousin        mat cousin  . Prostate cancer Brother   . Kidney failure Brother   . Kidney cancer Son   . Bladder Cancer Neg Hx    Social History    Socioeconomic History  . Marital status: Widowed    Spouse name: Not on file  . Number of children: 5  . Years of education: Not on file  . Highest education level: 8th grade  Occupational History  . Occupation: Retired  Tobacco Use  . Smoking status: Never Smoker  . Smokeless tobacco: Never Used  . Tobacco comment: smoking cessation materials not required  Vaping Use  . Vaping Use: Never used  Substance and Sexual Activity  . Alcohol use: No    Alcohol/week: 0.0 standard drinks  . Drug use: Never  . Sexual activity: Not Currently  Other Topics Concern  . Not on file  Social History Narrative   Pt lives with her daughter   Social Determinants of Health   Financial Resource Strain: Low Risk   . Difficulty of Paying Living Expenses: Not hard at all  Food Insecurity: No Food Insecurity  . Worried About Charity fundraiser in the Last Year: Never true  . Ran Out of Food in the Last Year: Never true  Transportation Needs: No Transportation Needs  . Lack of Transportation (Medical): No  . Lack of Transportation (Non-Medical): No  Physical Activity: Inactive  . Days of Exercise per Week: 0 days  . Minutes of Exercise per Session: 0 min  Stress: No Stress Concern Present  . Feeling of Stress : Not at all  Social Connections: Moderately Isolated  . Frequency of Communication with Friends and Family: More than three times a week  . Frequency of Social Gatherings with Friends and Family: More than three times a week  . Attends Religious Services: More than 4 times per year  . Active Member of Clubs or Organizations: No  . Attends Archivist Meetings: Never  . Marital Status: Widowed    Tobacco Counseling Counseling given: Not Answered Comment: smoking cessation materials not required   Clinical Intake:  Pre-visit preparation completed: Yes  Pain : No/denies pain     Nutritional Status: BMI > 30  Obese Nutritional Risks: None Diabetes: Yes CBG done?:  No Did pt. bring in CBG monitor from home?: No  How often do you need to have someone help you when you read instructions, pamphlets, or other written materials from your doctor or pharmacy?: 1 - Never  Nutrition Risk Assessment:  Has the patient had any N/V/D within the last 2 months?  No  Does the patient have any non-healing wounds?  No  Has the patient had any unintentional weight loss or weight gain?  No   Diabetes:  Is the patient diabetic?  Yes  If diabetic, was a CBG obtained today?  No  Did the patient bring in their glucometer from home?  No  How often do you monitor your CBG's? Three times per day .   Financial Strains and Diabetes Management:  Are you having any financial strains with the device, your supplies or your medication? No .  Does the patient want to  be seen by Chronic Care Management for management of their diabetes?  No  Would the patient like to be referred to a Nutritionist or for Diabetic Management?  No   Diabetic Exams:  Diabetic Eye Exam: Completed 02/03/20 negative retinopathy.   Diabetic Foot Exam: Completed 12/03/19.   Interpreter Needed?: No  Information entered by :: Clemetine Marker LPN   Activities of Daily Living In your present state of health, do you have any difficulty performing the following activities: 07/11/2020 12/01/2019  Hearing? Tempie Donning  Comment wears hearing aids patient has bilateral hearing aids  Vision? N N  Difficulty concentrating or making decisions? N N  Walking or climbing stairs? Y Y  Comment - uses a cane and hemiparesis  Dressing or bathing? N Y  Comment - yes daughter assists  Doing errands, shopping? N N  Preparing Food and eating ? N N  Using the Toilet? N N  In the past six months, have you accidently leaked urine? Y N  Comment wears pads/depends for protection -  Do you have problems with loss of bowel control? N N  Managing your Medications? N N  Managing your Finances? N Y  Comment - daughter assists   Housekeeping or managing your Housekeeping? N Y  Comment - daughter assists/ Patient lives with daughter  Some recent data might be hidden    Patient Care Team: [name], MD as PCP - General (Internal Medicine) [facility] as Consulting Physician (Ophthalmology) [place], Eppie Gibson, RN as [place] Management [place]an, Gordan Payment as Physician Assistant (Urology)  Indicate any recent Medical Services you may have received from other than [facility] providers in the past year (date may be approximate).     Assessment:   This is a routine wellness examination for [place].  Hearing/Vision screen  Hearing Screening   '125Hz'  '250Hz'  '500Hz'  '1000Hz'  '2000Hz'  '3000Hz'  '4000Hz'  '6000Hz'  '8000Hz'   Right ear:           Left ear:           Comments: Hearing aids maintained by Jodie at [facility]  Vision Screening Comments: Annual vision screenings done [facility] Dr. Atilano Median  Dietary issues and exercise activities discussed: Current Exercise Habits: The patient does not participate in regular exercise at present, Exercise limited by: orthopedic condition(s)  Goals    .  (THN)Learn More About My Health      Timeframe:  Long-Range Goal Priority:  Medium Start Date:      83094076                       Expected End Date:         [id number]             Follow Up Date  [id number] - ask questions - repeat what I heard to make sure I understand - bring a list of my medicines to the visit    Why is this important?   The best way to learn about your health and care is by talking to the doctor and nurse.  They will answer your questions and give you information in the way that you like best.    Notes:     .  [facility] and Keep All Appointments      Timeframe:  Long-Range Goal Priority:  Medium Start Date:    29244628  Expected End Date:    19379024                  Follow Up Date 09735329   - call to cancel if needed - keep a calendar with  prescription refill dates - keep a calendar with appointment dates    Why is this important?   Part of staying healthy is seeing the doctor for follow-up care.  If you forget your appointments, there are some things you can do to stay on track.    Notes:     .  (THN)Monitor and Manage My Blood Sugar      Timeframe:  Long-Range Goal Priority:  High Start Date: [id number]                             Expected End Date:    19622297                  Follow Up Date 98921194  - check blood sugar at prescribed times - check blood sugar if I feel it is too high or too low - enter blood sugar readings and medication or insulin into daily log - take the blood sugar log to all doctor visits - take the blood sugar meter to all doctor visits    Why is this important?   Checking your blood sugar at home helps to keep it from getting very high or very low.  Writing the results in a diary or log helps the doctor know how to care for you.  Your blood sugar log should have the time, date and the results.  Also, write down the amount of insulin or other medicine that you take.  Other information, like what you ate, exercise done and how you were feeling, will also be helpful.     Notes:  Fasting blood sugar 158    .  [facility] Eye Exam      Timeframe:  Long-Range Goal Priority:  Medium Start Date:    17408144                         Expected End Date: 81856314               Follow Up Date 97026378  - keep appointment with eye doctor - schedule appointment with eye doctor    Why is this important?   Eye check-ups are important when you have diabetes.  Vision loss can be prevented.    Notes:     .  (THN)Set My Target A1C      Timeframe:  Short-Term Goal Priority:  High Start Date:   58850277                          Expected End Date:  41287867                    Follow Up Date 67209470   - set target A1C    Why is this important?   Your target A1C is decided together by you and  your doctor.  It is based on several things like your age and other health issues.    Notes:  7.0    .  (THNPerform Foot Care      Timeframe:  Long-Range Goal Priority:  Medium Start Date:    96283662  Expected End Date:  32549826                    Follow Up Date 41583094   - check feet daily for cuts, sores or redness - keep feet up while sitting - trim toenails straight across - wash and dry feet carefully every day - wear comfortable, cotton socks - wear comfortable, well-fitting shoes    Why is this important?   Good foot care is very important when you have diabetes.  There are many things you can do to keep your feet healthy and catch a problem early.    Notes:     Marland Kitchen  Diabetes Patient stated goal (pt-stated)      .CARE PLAN ENTRY (see longtitudinal plan of care for additional care plan information)  Objective:  Lab Results  Component Value Date   HGBA1C 7.1 (H) 01/16/2019 .   Lab Results  Component Value Date   CREATININE 0.66 01/16/2019   CREATININE 0.63 09/19/2018   CREATININE 0.77 04/21/2018 .   Marland Kitchen No results found for: EGFR  Current Barriers:  Marland Kitchen Knowledge Deficits related to medications used for management of diabetes  Case Manager Clinical Goal(s):  Over the next 90 days, patient will demonstrate improved adherence to prescribed treatment plan for diabetes self care/management as evidenced by fasting blood sugars being 80-130. . Verbalize daily monitoring and recording of CBG within 90 days . Verbalize adherence to ADA/ carb modified diet within the next 90 days . Verbalize exercise 3 days/week . Verbalize following portion control  Interventions:  . Discussed plans with patient for ongoing care management follow up and provided patient with direct contact information for care management team . Reviewed scheduled/upcoming provider appointments including: A1C blood draws . RN sent educational material Exercise program activity  booklet . RN sent educational material on planning healthy meals to help with portion control  Patient Self Care Activities:   Patient verbalized motivation regarding increasing her physical activity by doing chair exercises daily while watching TV and increasing walking up to 2000 steps a day  Patient verbalized portion control when eating and monitoring weight.   RN will follow up outreach within the month of October     .  DIET - INCREASE WATER INTAKE      Recommend to drink at least 6-8 8oz glasses of water per day.    .  Increase physical activity      Pt would like to increase mobility and walk more inside and outside when weather is nice.       Depression Screen PHQ 2/9 Scores 07/11/2020 07/06/2020 07/04/2020 05/18/2020 12/03/2019 09/04/2019 05/18/2019  PHQ - 2 Score 0 0 0 0 0 0 0  PHQ- 9 Score - 0 0 0 0 0 -    Fall Risk Fall Risk  07/11/2020 07/06/2020 07/04/2020 05/18/2020 05/17/2020  Falls in the past year? 0 0 0 0 0  Comment - - - - -  Number falls in past yr: 0 0 0 0 0  Injury with Fall? 0 0 0 0 0  Risk Factor Category  - - - - -  Risk for fall due to : Impaired balance/gait;Impaired mobility Impaired balance/gait;Impaired mobility;History of fall(s) Impaired balance/gait;Impaired mobility - History of fall(s);Impaired balance/gait;Impaired mobility  Risk for fall due to: Comment - - - - -  Follow up Falls prevention discussed Falls evaluation completed Falls evaluation completed Falls evaluation completed Falls evaluation completed    FALL RISK PREVENTION PERTAINING TO  THE HOME:  Any stairs in or around the home? Yes  If so, are there any without handrails? No  Home free of loose throw rugs in walkways, pet beds, electrical cords, etc? Yes  Adequate lighting in your home to reduce risk of falls? Yes   ASSISTIVE DEVICES UTILIZED TO PREVENT FALLS:  Life alert? Yes  Use of a cane, walker or w/c? Yes  Grab bars in the bathroom? Yes  Shower chair or bench in shower?  Yes  Elevated toilet seat or a handicapped toilet? Yes   TIMED UP AND GO:  Was the test performed? No . Telephonic visit  Cognitive Function: Normal cognitive status assessed by direct observation by this Nurse Health Advisor. No abnormalities found.       6CIT Screen 01/12/2019 01/08/2018 01/07/2017  What Year? 0 points 0 points 0 points  What month? 0 points 0 points 0 points  What time? 0 points 0 points 0 points  Count back from 20 0 points 0 points 0 points  Months in reverse 0 points 0 points 0 points  Repeat phrase 2 points 0 points 0 points  Total Score 2 0 0    Immunizations Immunization History  Administered Date(s) Administered  . Fluad Quad(high Dose 65+) 01/16/2019, 02/11/2020  . Influenza, High Dose Seasonal PF 02/06/2017, 01/22/2018  . Influenza,inj,Quad PF,6+ Mos 02/18/2015  . PFIZER(Purple Top)SARS-COV-2 Vaccination 06/01/2019, 06/22/2019, 02/25/2020  . Pneumococcal Conjugate-13 06/22/2016  . Pneumococcal Polysaccharide-23 06/11/2017  . Tdap 07/10/2018    TDAP status: Up to date  Flu Vaccine status: Up to date  Pneumococcal vaccine status: Up to date  Covid-19 vaccine status: Completed vaccines  Qualifies for Shingles Vaccine? Yes   Zostavax completed No   Shingrix Completed?: No.    Education has been provided regarding the importance of this vaccine. Patient has been advised to call insurance company to determine out of pocket expense if they have not yet received this vaccine. Advised may also receive vaccine at local pharmacy or Health Dept. Verbalized acceptance and understanding.  Screening Tests Health Maintenance  Topic Date Due  . FOOT EXAM  12/02/2020  . HEMOGLOBIN A1C  01/03/2021  . OPHTHALMOLOGY EXAM  02/02/2021  . MAMMOGRAM  03/09/2021  . TETANUS/TDAP  07/10/2028  . INFLUENZA VACCINE  Completed  . DEXA SCAN  Completed  . COVID-19 Vaccine  Completed  . Hepatitis C Screening  Completed  . PNA vac Low Risk Adult  Completed    Health  Maintenance  There are no preventive care reminders to display for this patient.  Colorectal cancer screening: No longer required.   Mammogram status: Completed 03/09/20. Repeat every year  Bone Density status: Completed 02/04/18. Results reflect: Bone density results: OSTEOPENIA. Repeat every 2 years.  Lung Cancer Screening: (Low Dose CT Chest recommended if Age 74-80 years, 30 pack-year currently smoking OR have quit w/in 15years.) does not qualify.   Additional Screening:  Hepatitis C Screening: does qualify; Completed 06/11/17  Vision Screening: Recommended annual ophthalmology exams for early detection of glaucoma and other disorders of the eye. Is the patient up to date with their annual eye exam?  Yes  Who is the provider or what is the name of the office in which the patient attends annual eye exams? Dr. Atilano Median  Dental Screening: Recommended annual dental exams for proper oral hygiene  Community Resource Referral / Chronic Care Management: CRR required this visit?  No   CCM required this visit?  No      Plan:  I have personally reviewed and noted the following in the patient's chart:   . Medical and social history . Use of alcohol, tobacco or illicit drugs  . Current medications and supplements . Functional ability and status . Nutritional status . Physical activity . Advanced directives . List of other physicians . Hospitalizations, surgeries, and ER visits in previous 12 months . Vitals . Screenings to include cognitive, depression, and falls . Referrals and appointments  In addition, I have reviewed and discussed with patient certain preventive protocols, quality metrics, and best practice recommendations. A written personalized care plan for preventive services as well as general preventive health recommendations were provided to patient.     Clemetine Marker, LPN   6/42/9037   Nurse Notes: none

## 2020-07-13 ENCOUNTER — Ambulatory Visit: Payer: Self-pay | Admitting: *Deleted

## 2020-07-13 ENCOUNTER — Other Ambulatory Visit: Payer: Self-pay | Admitting: Internal Medicine

## 2020-07-13 DIAGNOSIS — E118 Type 2 diabetes mellitus with unspecified complications: Secondary | ICD-10-CM

## 2020-07-13 MED ORDER — METFORMIN HCL 500 MG PO TABS: 250.0000 mg | ORAL_TABLET | Freq: Every day | ORAL | 3 refills | Status: DC

## 2020-07-13 NOTE — Telephone Encounter (Signed)
I will send in the metformin formulation that can be cut in half.

## 2020-07-13 NOTE — Telephone Encounter (Signed)
Tried calling patient to inform her. Phone line busy. Will try again.

## 2020-07-13 NOTE — Telephone Encounter (Signed)
Patient is not able to get glucose reading- 6:15am- HI. Patient called to report she was doing fine without metformin- but her levels did go up today. Patient has eaten and has taken her morning medications- when she got the HI reading she did take a metformin 500 mg because she did not have any other dose to take. When asked to recheck- her reading is 185. Patient states she was told to call if her glucose levels increased- so she is calling.  Plan at last visit: BS controlled with frequent low blood sugars Will discontinue metformin - consider 250 mg daily if BS spike - POCT glycosylated hemoglobin (Hb A1C) = 6.4  Reason for Disposition . [1] Caller has NON-URGENT medication or insulin pump question AND [2] triager unable to answer question  Answer Assessment - Initial Assessment Questions 1. BLOOD GLUCOSE: "What is your blood glucose level?"      185 2. ONSET: "When did you check the blood glucose?"     9:15 3. USUAL RANGE: "What is your glucose level usually?" (e.g., usual fasting morning value, usual evening value)     under 140 4. KETONES: "Do you check for ketones (urine or blood test strips)?" If yes, ask: "What does the test show now?"      n/a 5. TYPE 1 or 2:  "Do you know what type of diabetes you have?"  (e.g., Type 1, Type 2, Gestational; doesn't know)      Type 2 6. INSULIN: "Do you take insulin?" "What type of insulin(s) do you use? What is the mode of delivery? (syringe, pen; injection or pump)?"      no 7. DIABETES PILLS: "Do you take any pills for your diabetes?" If yes, ask: "Have you missed taking any pills recently?"     Metformin, Patient had stopped last Friday- lower dose not received- levels have been going up- last 2 days- 140, 123,137- fasting last 3 days 8. OTHER SYMPTOMS: "Do you have any symptoms?" (e.g., fever, frequent urination, difficulty breathing, dizziness, weakness, vomiting)    no 9. PREGNANCY: "Is there any chance you are pregnant?" "When was your  last menstrual period?"     n/a  Protocols used: DIABETES - HIGH BLOOD SUGAR-A-AH

## 2020-07-14 NOTE — Telephone Encounter (Signed)
Patient informed to take 1/2 tablet daily.

## 2020-07-22 ENCOUNTER — Telehealth: Payer: Self-pay

## 2020-07-22 NOTE — Telephone Encounter (Signed)
Called pt could not leave VM to call back. Phone hung up.   Will route result note to [facility] Nurse Triage for follow up when patient returns call to clinic. Nurse may give results to patient if they return call. CRM created for this message.   KP

## 2020-07-22 NOTE — Telephone Encounter (Signed)
Copied from CRM [id number]. Topic: General - Other >> Jul 22, 2020  9:10 AM Sharon York wrote: Reason for CRM: Patient would like the nurse to call her regarding a bone density appt that she has.  Please call to discuss at [phone]

## 2020-07-25 NOTE — Telephone Encounter (Signed)
Attempted to call patient at number listed- no answer- unable to leave message.

## 2020-07-25 NOTE — Telephone Encounter (Signed)
Patient called and advised of the bone density appointment on 08/04/20 at 1000.

## 2020-07-31 NOTE — Progress Notes (Signed)
Error

## 2020-08-01 ENCOUNTER — Encounter: Payer: Self-pay | Admitting: Urology

## 2020-08-01 ENCOUNTER — Other Ambulatory Visit: Payer: Self-pay

## 2020-08-01 ENCOUNTER — Ambulatory Visit (INDEPENDENT_AMBULATORY_CARE_PROVIDER_SITE_OTHER): Payer: Medicare Other | Admitting: Urology

## 2020-08-01 DIAGNOSIS — R350 Frequency of micturition: Secondary | ICD-10-CM

## 2020-08-01 NOTE — Progress Notes (Signed)
Patient ID: Sharon York, female   DOB: February 13, 1946, 75 y.o.   MRN: [id number] PTNS  Session # monthly   Health & Social Factors: no change Caffeine: 0 Alcohol: 0 Daytime voids #per day: 6 Night-time voids #per night: 1-2 Urgency: mild Incontinence Episodes #per day: 0 Ankle used: right Treatment Setting: 8 Feeling/ Response: both Comments: pt tolerated well  Performed By: Mervin Hack, CMA  Assistant:   Follow Up: monthly

## 2020-08-04 ENCOUNTER — Other Ambulatory Visit: Payer: Self-pay

## 2020-08-04 ENCOUNTER — Ambulatory Visit
Admission: RE | Admit: 2020-08-04 | Discharge: 2020-08-04 | Disposition: A | Discharge status: Home / Self Care | Payer: Medicare Other | Source: Ambulatory Visit | Attending: Internal Medicine | Admitting: Internal Medicine

## 2020-08-04 DIAGNOSIS — Z780 Asymptomatic menopausal state: Secondary | ICD-10-CM | POA: Insufficient documentation

## 2020-08-04 DIAGNOSIS — M85851 Other specified disorders of bone density and structure, right thigh: Secondary | ICD-10-CM | POA: Diagnosis not present

## 2020-08-11 ENCOUNTER — Telehealth: Payer: Self-pay | Admitting: *Deleted

## 2020-08-11 NOTE — Telephone Encounter (Signed)
Pt called back to obtain previously given results of bone scan; results and recommendations per Dr Judithann Cerreta, "Mild osteopenia of the hip. Normal spine. Recommend calcium 1200 mg per day and Vitamin D 1000 IU daily plus weight bearing exercise. Will repeat DEXA in 2-3 years"; pt verbalized understanding and repeated dosages for correctness; will route to office for notification of encounter.

## 2020-08-16 ENCOUNTER — Other Ambulatory Visit: Payer: Self-pay | Admitting: *Deleted

## 2020-08-16 NOTE — Patient Instructions (Signed)
Goals Addressed            This Visit's Progress   . (THN)Learn More About My Health   On track    Timeframe:  Long-Range Goal Priority:  Medium Start Date:      [id number]                       Expected End Date:         27062376            Follow Up Date  28315176 - ask questions - repeat what I heard to make sure I understand - bring a list of my medicines to the visit    Why is this important?   The best way to learn about your health and care is by talking to the doctor and nurse.  They will answer your questions and give you information in the way that you like best.    Notes:     . [facility] and Keep All Appointments   On track    Timeframe:  Long-Range Goal Priority:  Medium Start Date:    16073710                         Expected End Date:    62694854                Follow Up Date 62703500   - call to cancel if needed - keep a calendar with prescription refill dates - keep a calendar with appointment dates    Why is this important?   Part of staying healthy is seeing the doctor for follow-up care.  If you forget your appointments, there are some things you can do to stay on track.    Notes:     . (THN)Monitor and Manage My Blood Sugar   On track    Timeframe:  Long-Range Goal Priority:  High Start Date: 93818299                             Expected End Date:    37169678                  Follow Up Date 93810175  - check blood sugar at prescribed times - check blood sugar if I feel it is too high or too low - enter blood sugar readings and medication or insulin into daily log - take the blood sugar log to all doctor visits - take the blood sugar meter to all doctor visits    Why is this important?   Checking your blood sugar at home helps to keep it from getting very high or very low.  Writing the results in a diary or log helps the doctor know how to care for you.  Your blood sugar log should have the time, date and the results.  Also, write down the  amount of insulin or other medicine that you take.  Other information, like what you ate, exercise done and how you were feeling, will also be helpful.     Notes:  Fasting blood sugar 158    . [facility] Eye Exam   On track    Timeframe:  Long-Range Goal Priority:  Medium Start Date:    10258527  Expected End Date: 59923414              Follow Up Date 43601658 - keep appointment with eye doctor - schedule appointment with eye doctor    Why is this important?   Eye check-ups are important when you have diabetes.  Vision loss can be prevented.    Notes:  Last eye exam was done in 2021    . (THN)Set My Target A1C   On track    Timeframe:  Short-Term Goal Priority:  High Start Date:   00634949                          Expected End Date:  44739584                 Follow Up Date 41712787   - set target A1C    Why is this important?   Your target A1C is decided together by you and your doctor.  It is based on several things like your age and other health issues.    Notes:  7.0 [id number] Patient A1C is 6.4. Patient has met goal    . ([place]   On track    Timeframe:  Long-Range Goal Priority:  Medium Start Date:    00164290                         Expected End Date:  37955831         Follow Up Date 67425525   - check feet daily for cuts, sores or redness - keep feet up while sitting - trim toenails straight across - wash and dry feet carefully every day - wear comfortable, cotton socks - wear comfortable, well-fitting shoes    Why is this important?   Good foot care is very important when you have diabetes.  There are many things you can do to keep your feet healthy and catch a problem early.    Notes:

## 2020-08-16 NOTE — Patient Outreach (Signed)
[facility]) Care Management  [place]  08/16/2020   Sharon York December 19, 1945 [id number]  RN Health Coach telephone call to patient.  Hip[employer] compliance verified. Per patient her fasting blood sugar is 134. Patient A1C is 6.4 decreased from 7.0. Per patient her metformin was decreased. Patient has been drinking diet cranberry juice instead of sodas. Patient has been using portion control. Per patient her family and grandchildren came to visit. Per patient she is resting better since their visit.  Patient is doing puzzle books to keep her brain active. Patient has not had any recent falls. Patient has agreed to follow up outreach calls.   Encounter Medications:  Outpatient Encounter Medications as of 08/16/2020  Medication Sig  . ACCU-CHEK AVIVA PLUS test strip 1 each by Other route 4 (four) times daily.  . Accu-Chek Softclix Lancets lancets USE TO CHECK GLUCOSE UP TO 4 TIMES DAILY  . alendronate (FOSAMAX) 70 MG tablet Take 1 tablet (70 mg total) by mouth once a week. Take with a full glass of water on an empty stomach.  Marland Kitchen aspirin 81 MG chewable tablet Chew 1 tablet by mouth daily.  Marland Kitchen atorvastatin (LIPITOR) 10 MG tablet Take 1 tablet by mouth once daily  . baclofen (LIORESAL) 10 MG tablet Take 1 tablet (10 mg total) by mouth 2 (two) times daily.  . Calcium Carbonate-Vitamin D 500-125 MG-UNIT TABS Take 1 tablet by mouth daily.   Marland Kitchen lisinopril (ZESTRIL) 2.5 MG tablet Take 1 tablet (2.5 mg total) by mouth daily.  . metFORMIN (GLUCOPHAGE) 500 MG tablet Take 0.5 tablets (250 mg total) by mouth daily with breakfast.  . Multiple Vitamins-Minerals (MULTIVITAMIN ADULT PO) Take by mouth.  . nystatin cream (MYCOSTATIN) Apply 1 application topically 2 (two) times daily. To skin under breast   No facility-administered encounter medications on file as of 08/16/2020.    Functional Status:  In your present state of health, do you have any difficulty performing the following  activities: 07/11/2020 12/01/2019  Hearing? Tempie Donning  Comment wears hearing aids patient has bilateral hearing aids  Vision? N N  Difficulty concentrating or making decisions? N N  Walking or climbing stairs? Y Y  Comment - uses a cane and hemiparesis  Dressing or bathing? N Y  Comment - yes daughter assists  Doing errands, shopping? N N  Preparing Food and eating ? N N  Using the Toilet? N N  In the past six months, have you accidently leaked urine? Y N  Comment wears pads/depends for protection -  Do you have problems with loss of bowel control? N N  Managing your Medications? N N  Managing your Finances? N Y  Comment - daughter assists  Housekeeping or managing your Housekeeping? N Y  Comment - daughter assists/ Patient lives with daughter  Some recent data might be hidden    Fall/Depression Screening: Fall Risk  07/11/2020 07/06/2020 07/04/2020  Falls in the past year? 0 0 0  Comment - - -  Number falls in past yr: 0 0 0  Injury with Fall? 0 0 0  Risk Factor Category  - - -  Risk for fall due to : Impaired balance/gait;Impaired mobility Impaired balance/gait;Impaired mobility;History of fall(s) Impaired balance/gait;Impaired mobility  Risk for fall due to: Comment - - -  Follow up Falls prevention discussed Falls evaluation completed Falls evaluation completed   PHQ 2/9 Scores 07/11/2020 07/06/2020 07/04/2020 05/18/2020 12/03/2019 09/04/2019 05/18/2019  PHQ - 2 Score 0 0 0 0 0  0 0  PHQ- 9 Score - 0 0 0 0 0 -    Assessment:  Goals Addressed            This Visit's Progress   . (THN)Learn More About My Health   On track    Timeframe:  Long-Range Goal Priority:  Medium Start Date:      07680881                       Expected End Date:         10315945            Follow Up Date  85929244 - ask questions - repeat what I heard to make sure I understand - bring a list of my medicines to the visit    Why is this important?   The best way to learn about your health and care is by  talking to the doctor and nurse.  They will answer your questions and give you information in the way that you like best.    Notes:     . [facility] and Keep All Appointments   On track    Timeframe:  Long-Range Goal Priority:  Medium Start Date:    62863817                         Expected End Date:    71165790                Follow Up Date 38333832   - call to cancel if needed - keep a calendar with prescription refill dates - keep a calendar with appointment dates    Why is this important?   Part of staying healthy is seeing the doctor for follow-up care.  If you forget your appointments, there are some things you can do to stay on track.    Notes:     . (THN)Monitor and Manage My Blood Sugar   On track    Timeframe:  Long-Range Goal Priority:  High Start Date: 91916606                             Expected End Date:    00459977                  Follow Up Date 41423953  - check blood sugar at prescribed times - check blood sugar if I feel it is too high or too low - enter blood sugar readings and medication or insulin into daily log - take the blood sugar log to all doctor visits - take the blood sugar meter to all doctor visits    Why is this important?   Checking your blood sugar at home helps to keep it from getting very high or very low.  Writing the results in a diary or log helps the doctor know how to care for you.  Your blood sugar log should have the time, date and the results.  Also, write down the amount of insulin or other medicine that you take.  Other information, like what you ate, exercise done and how you were feeling, will also be helpful.     Notes:  Fasting blood sugar 158    . [facility] Eye Exam   On track    Timeframe:  Long-Range Goal Priority:  Medium Start Date:    20233435  Expected End Date: 83437357              Follow Up Date 89784784 - keep appointment with eye doctor - schedule appointment with eye doctor     Why is this important?   Eye check-ups are important when you have diabetes.  Vision loss can be prevented.    Notes:  Last eye exam was done in 2021    . (THN)Set My Target A1C   On track    Timeframe:  Short-Term Goal Priority:  High Start Date:   12820813                          Expected End Date:  88719597                 Follow Up Date 47185501   - set target A1C    Why is this important?   Your target A1C is decided together by you and your doctor.  It is based on several things like your age and other health issues.    Notes:  7.0 [id number] Patient A1C is 6.4. Patient has met goal    . ([place]   On track    Timeframe:  Long-Range Goal Priority:  Medium Start Date:    93552174                         Expected End Date:  71595396         Follow Up Date 72897915   - check feet daily for cuts, sores or redness - keep feet up while sitting - trim toenails straight across - wash and dry feet carefully every day - wear comfortable, cotton socks - wear comfortable, well-fitting shoes    Why is this important?   Good foot care is very important when you have diabetes.  There are many things you can do to keep your feet healthy and catch a problem early.    Notes:        Plan:  Follow-up:  Patient agrees to Care Plan and Follow-up. RN assisted patient to order free COVID kit Patient will continue to monitor A1C and blood sugars RN will follow within the month of June RN sent PCP update assessment  [facility] [phone]

## 2020-08-29 NOTE — Telephone Encounter (Signed)
Patient would like the nurse to call her again to go over her bone density results.  She stated that she has forgotten what the nurse told her previously.  Please advise.

## 2020-08-29 NOTE — Telephone Encounter (Signed)
Patient informed of Dr Karn Cassis recommendations on her VM. Told her to call back with any additional questions.

## 2020-09-05 ENCOUNTER — Encounter: Payer: Self-pay | Admitting: Urology

## 2020-09-05 ENCOUNTER — Other Ambulatory Visit: Payer: Self-pay

## 2020-09-05 ENCOUNTER — Ambulatory Visit (INDEPENDENT_AMBULATORY_CARE_PROVIDER_SITE_OTHER): Payer: Medicare Other | Admitting: Urology

## 2020-09-05 DIAGNOSIS — R350 Frequency of micturition: Secondary | ICD-10-CM | POA: Diagnosis not present

## 2020-09-05 NOTE — Progress Notes (Signed)
Patient ID: Sharon York, female   DOB: 09-22-1945, 75 y.o.   MRN: [id number] PTNS  Session # monthly  Health & Social Factors: no change Caffeine: 0 Alcohol: 0 Daytime voids #per day: 5 Night-time voids #per night: 1-2 Urgency: mild Incontinence Episodes #per day: 0 Ankle used: right Treatment Setting: 7 Feeling/ Response: sensory Comments: pt tolerated well  Performed By: Mervin Hack, CMA  Assistant:   Follow Up: monthly

## 2020-10-03 ENCOUNTER — Ambulatory Visit (INDEPENDENT_AMBULATORY_CARE_PROVIDER_SITE_OTHER): Payer: Medicare Other | Admitting: Urology

## 2020-10-03 ENCOUNTER — Other Ambulatory Visit: Payer: Self-pay

## 2020-10-03 DIAGNOSIS — R350 Frequency of micturition: Secondary | ICD-10-CM | POA: Diagnosis not present

## 2020-10-03 NOTE — Progress Notes (Signed)
PTNS  Session # Monthly Maintenance  Health & Social Factors: No change Caffeine: 0 Alcohol: 0 Daytime voids #per day: 5-6 Night-time voids #per night: 0 Urgency: Strong Incontinence Episodes #per day: 5-6 Ankle used: Right Treatment Setting: 13 Feeling/ Response: Sensory Comments: Patient tolerated the procedure.   Performed By: Michiel Cowboy, PA-C  Assistant: Martha Clan, CMA   Follow Up: one month

## 2020-10-12 ENCOUNTER — Telehealth: Payer: Self-pay

## 2020-10-12 NOTE — Telephone Encounter (Signed)
Copied from CRM [id number]. Topic: General - Other >> Oct 12, 2020  3:33 PM Sharon York wrote: Reason for CRM: Patient called in to inquire of Dr Judithann Feltman if she need to be still taking the calcium and if it is ok that she takes 2000 units of Vitamin D also the multivitamin. Please call with an answer at Ph# [phone]

## 2020-10-12 NOTE — Telephone Encounter (Signed)
Spoke with patient and advised for her to take calcium 1200 mg daily and vitamin D 1000 units daily per Dr. Judithann Proud.  Can still take multivitamin also.  No further questions.

## 2020-11-07 ENCOUNTER — Ambulatory Visit: Payer: Medicare Other | Admitting: Urology

## 2020-11-16 ENCOUNTER — Other Ambulatory Visit: Payer: Self-pay | Admitting: Internal Medicine

## 2020-11-16 ENCOUNTER — Other Ambulatory Visit: Payer: Self-pay | Admitting: *Deleted

## 2020-11-16 MED ORDER — ACCU-CHEK AVIVA PLUS VI STRP: 1.0000 | ORAL_STRIP | Freq: Four times a day (QID) | 12 refills | Status: DC

## 2020-11-16 NOTE — Patient Instructions (Signed)
Goals Addressed             This Visit's Progress    (THN)Learn More About My Health       Timeframe:  Long-Range Goal Priority:  Medium Start Date:      18841660                       Expected End Date:         63016010            Follow Up Date  93235573 - ask questions - repeat what I heard to make sure I understand - bring a list of my medicines to the visit    Why is this important?   The best way to learn about your health and care is by talking to the doctor and nurse.  They will answer your questions and give you information in the way that you like best.    Notes:  [id number] Patient is eager to learn more information about her health      [facility] and Keep All Appointments   On track    Timeframe:  Long-Range Goal Priority:  Medium Start Date:    06237628                         Expected End Date:    31517616                Follow Up Date 07371062   - call to cancel if needed - keep a calendar with prescription refill dates - keep a calendar with appointment dates    Why is this important?   Part of staying healthy is seeing the doctor for follow-up care.  If you forget your appointments, there are some things you can do to stay on track.    Notes:       (THN)Monitor and Manage My Blood Sugar   On track    Timeframe:  Long-Range Goal Priority:  High Start Date: 69485462                             Expected End Date:    70350093                  Follow Up Date 81829937  - check blood sugar at prescribed times - check blood sugar if I feel it is too high or too low - enter blood sugar readings and medication or insulin into daily log - take the blood sugar log to all doctor visits - take the blood sugar meter to all doctor visits    Why is this important?   Checking your blood sugar at home helps to keep it from getting very high or very low.  Writing the results in a diary or log helps the doctor know how to care for you.  Your blood sugar log should  have the time, date and the results.  Also, write down the amount of insulin or other medicine that you take.  Other information, like what you ate, exercise done and how you were feeling, will also be helpful.     Notes:  Fasting blood sugar 158 [id number] Fasting blood sugar today was 138      (THN)Obtain Eye Exam   On track    Timeframe:  Long-Range Goal Priority:  Medium Start Date:    81017510  Expected End Date: 65465035              Follow Up Date 46568127 - keep appointment with eye doctor - schedule appointment with eye doctor    Why is this important?   Eye check-ups are important when you have diabetes.  Vision loss can be prevented.    Notes:  Last eye exam was done in 2021      (THN)Set My Target A1C   On track    Timeframe:  Short-Term Goal Priority:  High Start Date:   51700174                          Expected End Date:  94496759                 Follow Up Date 16384665   - set target A1C    Why is this important?   Your target A1C is decided together by you and your doctor.  It is based on several things like your age and other health issues.    Notes:  7.0 99357017 Patient A1C is 6.4. Patient has met goal A1C remains 6.4      (THNPerform Foot Care   On track    Timeframe:  Long-Range Goal Priority:  Medium Start Date:    79390300                         Expected End Date:  92330076         Follow Up Date 22633354   - check feet daily for cuts, sores or redness - keep feet up while sitting - trim toenails straight across - wash and dry feet carefully every day - wear comfortable, cotton socks - wear comfortable, well-fitting shoes    Why is this important?   Good foot care is very important when you have diabetes.  There are many things you can do to keep your feet healthy and catch a problem early.    Notes:  56256389 Patient monitors feet for sores and ulcers

## 2020-11-16 NOTE — Patient Outreach (Signed)
[facility]) Care Management  [place]  11/16/2020   Sharon York 09/21/1945 [id number]  RN Health Coach telephone call to patient.  [employer] compliance verified. Per patient she is doing good. Her fasting blood sugar was 138. Her A1C is 6.4. She is walking @ the house at night for exercise. She is going to the center the=ree times a wed ti exercise with their equipment, socialization and have lunch.  She has not had any recent falls.  She is reading and doing puzzles to keep her mind active, Does have some difficulty hearing from rt ear. She wears a hearing aid.  Patient  has agreed to follow up outreach calls.  Encounter Medications:  Outpatient Encounter Medications as of 11/16/2020  Medication Sig   ACCU-CHEK AVIVA PLUS test strip 1 each by Other route 4 (four) times daily.   Accu-Chek Softclix Lancets lancets USE TO CHECK GLUCOSE UP TO 4 TIMES DAILY   alendronate (FOSAMAX) 70 MG tablet Take 1 tablet (70 mg total) by mouth once a week. Take with a full glass of water on an empty stomach.   aspirin 81 MG chewable tablet Chew 1 tablet by mouth daily.   atorvastatin (LIPITOR) 10 MG tablet Take 1 tablet by mouth once daily   baclofen (LIORESAL) 10 MG tablet Take 1 tablet (10 mg total) by mouth 2 (two) times daily.   Calcium Carbonate-Vitamin D 500-125 MG-UNIT TABS Take 1 tablet by mouth daily.    lisinopril (ZESTRIL) 2.5 MG tablet Take 1 tablet (2.5 mg total) by mouth daily.   metFORMIN (GLUCOPHAGE) 500 MG tablet Take 0.5 tablets (250 mg total) by mouth daily with breakfast.   Multiple Vitamins-Minerals (MULTIVITAMIN ADULT PO) Take by mouth.   nystatin cream (MYCOSTATIN) Apply 1 application topically 2 (two) times daily. To skin under breast   No facility-administered encounter medications on file as of 11/16/2020.    Functional Status:  In your present state of health, do you have any difficulty performing the following activities: 07/11/2020 12/01/2019   Hearing? Tempie Donning  Comment wears hearing aids patient has bilateral hearing aids  Vision? N N  Difficulty concentrating or making decisions? N N  Walking or climbing stairs? Y Y  Comment - uses a cane and hemiparesis  Dressing or bathing? N Y  Comment - yes daughter assists  Doing errands, shopping? N N  Preparing Food and eating ? N N  Using the Toilet? N N  In the past six months, have you accidently leaked urine? Y N  Comment wears pads/depends for protection -  Do you have problems with loss of bowel control? N N  Managing your Medications? N N  Managing your Finances? N Y  Comment - daughter assists  Housekeeping or managing your Housekeeping? N Y  Comment - daughter assists/ Patient lives with daughter  Some recent data might be hidden    Fall/Depression Screening: Fall Risk  11/16/2020 07/11/2020 07/06/2020  Falls in the past year? 0 0 0  Comment - - -  Number falls in past yr: 0 0 0  Injury with Fall? 0 0 0  Risk Factor Category  - - -  Risk for fall due to : History of fall(s);Impaired balance/gait;Impaired mobility Impaired balance/gait;Impaired mobility Impaired balance/gait;Impaired mobility;History of fall(s)  Risk for fall due to: Comment - - -  Follow up Falls evaluation completed Falls prevention discussed Falls evaluation completed   [facility] 2/9 Scores 07/11/2020 07/06/2020 07/04/2020 05/18/2020 12/03/2019 09/04/2019 05/18/2019  PHQ -  2 Score 0 0 0 0 0 0 0  PHQ- 9 Score - 0 0 0 0 0 -    Assessment:   Care Plan Care Plan : Diabetes Type 2 (Adult)  Updates made by Verlin Grills, RN since 11/16/2020 12:00 AM     Problem: Glycemic Management (Diabetes, Type 2)   Priority: Medium  Onset Date: 05/17/2020     Long-Range Goal: Glycemic Management Optimized   Start Date: 03/04/2020  Expected End Date: 05/19/2021  Recent Progress: On track  Priority: High  Note:   Evidence-based guidance:  Anticipate A1C testing (point-of-care) every 3 to 6 months based on goal  attainment.  Review mutually-set A1C goal or target range.  Anticipate use of antihyperglycemic with or without insulin and periodic adjustments; consider active involvement of pharmacist.  Provide medical nutrition therapy and development of individualized eating.  Compare self-reported symptoms of hypo or hyperglycemia to blood glucose levels, diet and fluid intake, current medications, psychosocial and physiologic stressors, change in activity and barriers to care adherence.  Promote self-monitoring of blood glucose levels.  Assess and address barriers to management plan, such as food insecurity, age, developmental ability, depression, anxiety, fear of hypoglycemia or weight gain, as well as medication cost, side effects and complicated regimen.  Consider referral to community-based diabetes education program, visiting nurse, community health worker or health coach.  Encourage regular dental care for treatment of periodontal disease; refer to dental provider when needed.   Notes:  [id number] Patient is going to center to exercise, and have socialization and lunch twice a week.      Goals Addressed             This Visit's Progress    (THN)Learn More About My Health       Timeframe:  Long-Range Goal Priority:  Medium Start Date:      29924268                       Expected End Date:         34196222            Follow Up Date  [id number] - ask questions - repeat what I heard to make sure I understand - bring a list of my medicines to the visit    Why is this important?   The best way to learn about your health and care is by talking to the doctor and nurse.  They will answer your questions and give you information in the way that you like best.    Notes:  [id number] Patient is eager to learn more information about her health      [facility] and Keep All Appointments   On track    Timeframe:  Long-Range Goal Priority:  Medium Start Date:    44818563                          Expected End Date:    14970263                Follow Up Date 78588502   - call to cancel if needed - keep a calendar with prescription refill dates - keep a calendar with appointment dates    Why is this important?   Part of staying healthy is seeing the doctor for follow-up care.  If you forget your appointments, there are some things you can do to stay on track.    Notes:       (  THN)Monitor and Manage My Blood Sugar   On track    Timeframe:  Long-Range Goal Priority:  High Start Date: 41962229                             Expected End Date:    79892119                  Follow Up Date 41740814  - check blood sugar at prescribed times - check blood sugar if I feel it is too high or too low - enter blood sugar readings and medication or insulin into daily log - take the blood sugar log to all doctor visits - take the blood sugar meter to all doctor visits    Why is this important?   Checking your blood sugar at home helps to keep it from getting very high or very low.  Writing the results in a diary or log helps the doctor know how to care for you.  Your blood sugar log should have the time, date and the results.  Also, write down the amount of insulin or other medicine that you take.  Other information, like what you ate, exercise done and how you were feeling, will also be helpful.     Notes:  Fasting blood sugar 158 [id number] Fasting blood sugar today was 138      [facility] Eye Exam   On track    Timeframe:  Long-Range Goal Priority:  Medium Start Date:    49702637                         Expected End Date: 85885027              Follow Up Date 74128786 - keep appointment with eye doctor - schedule appointment with eye doctor    Why is this important?   Eye check-ups are important when you have diabetes.  Vision loss can be prevented.    Notes:  Last eye exam was done in 2021      (THN)Set My Target A1C   On track    Timeframe:  Short-Term Goal Priority:   High Start Date:   76720947                          Expected End Date:  09628366                 Follow Up Date 29476546   - set target A1C    Why is this important?   Your target A1C is decided together by you and your doctor.  It is based on several things like your age and other health issues.    Notes:  7.0 [id number] Patient A1C is 6.4. Patient has met goal A1C remains 6.4      (THNPerform Foot Care   On track    Timeframe:  Long-Range Goal Priority:  Medium Start Date:    81275170                         Expected End Date:  01749449         Follow Up Date 67591638   - check feet daily for cuts, sores or redness - keep feet up while sitting - trim toenails straight across - wash and dry feet carefully every day - wear comfortable, cotton socks -  wear comfortable, well-fitting shoes    Why is this important?   Good foot care is very important when you have diabetes.  There are many things you can do to keep your feet healthy and catch a problem early.    Notes:  72897915 Patient monitors feet for sores and ulcers         Plan:  Follow-up: Patient agrees to Care Plan and Follow-up. Patient will go to center twice a week for exercise, socialization and lunch She will maintain A1C < 6.5 RN will follow up outreach within the month of September RN sent update assessment to PCP  [place] [facility] [phone]

## 2020-11-16 NOTE — Telephone Encounter (Signed)
Copied from CRM [id number]. Topic: Quick Communication - Rx Refill/Question >> Nov 16, 2020  9:45 AM Jaquita Rector A wrote: Medication: ACCU-CHEK AVIVA PLUS test strip  Previous Rx was sent to [facility]  Has the patient contacted their pharmacy? Yes.   (Agent: If no, request that the patient contact the pharmacy for the refill.) (Agent: If yes, when and what did the pharmacy advise?)  Preferred Pharmacy (with phone number or street name): [id number] - [place], [place] - [address]  Phone:  [phone] Fax:  [phone]     Agent: Please be advised that RX refills may take up to 3 business days. We ask that you follow-up with your pharmacy.

## 2020-11-18 ENCOUNTER — Telehealth: Payer: Self-pay

## 2020-11-18 NOTE — Telephone Encounter (Signed)
Noted  KP 

## 2020-11-18 NOTE — Telephone Encounter (Signed)
Copied from CRM [id number]. Topic: General - Inquiry >> Nov 18, 2020  9:22 AM Daphine Deutscher D wrote: Reason for CRM: Pt was bit by something on her back about a week ago and it has been itching really bad.  It is like a couple of places.  Red itchy bump Please advise  [phone]

## 2020-11-23 ENCOUNTER — Ambulatory Visit
Admission: RE | Admit: 2020-11-23 | Discharge: 2020-11-23 | Disposition: A | Discharge status: Home / Self Care | Payer: Medicare Other | Source: Ambulatory Visit | Attending: Sports Medicine | Admitting: Sports Medicine

## 2020-11-23 ENCOUNTER — Other Ambulatory Visit: Payer: Self-pay

## 2020-11-23 VITALS — BP 121/75 | HR 85 | Temp 98.2°F | Resp 16 | Ht <= 58 in | Wt 160.9 lb

## 2020-11-23 DIAGNOSIS — L299 Pruritus, unspecified: Secondary | ICD-10-CM | POA: Diagnosis not present

## 2020-11-23 DIAGNOSIS — R21 Rash and other nonspecific skin eruption: Secondary | ICD-10-CM

## 2020-11-23 MED ORDER — HYDROXYZINE HCL 25 MG PO TABS: 25.0000 mg | ORAL_TABLET | Freq: Four times a day (QID) | ORAL | 0 refills | Status: DC

## 2020-11-23 NOTE — ED Triage Notes (Signed)
Patient states that she has itching all over body with rash x 1-2 weeks.

## 2020-11-23 NOTE — Discharge Instructions (Addendum)
As we discussed, I am prescribing a medication for your itchiness. You can use calamine lotion, over-the-counter hydrocortisone cream 1%, topical Benadryl, and oral Benadryl.  The oral Benadryl can make you sleepy so just use it at nighttime.  The medicine I prescribed you can use during the daytime. If you have a sensation that your throat is closing, you have chest pain, shortness of breath, wheezing, or any other concerning symptoms please call 911 and go to the ER. Please see educational handouts. You reported to me that you have an appointment with your primary care physician in August and I would encourage you to keep that.  If your symptoms persist please call them and see if you can get in sooner.

## 2020-11-23 NOTE — ED Provider Notes (Signed)
[facility]    CSN: [id number] Arrival date & time: 11/23/20  7902      History   Chief Complaint Chief Complaint  Patient presents with   Pruritis    HPI Sharon York is a 75 y.o. female.   75 year old female who presents for evaluation of itchiness and a rash for 1 to 2 weeks mostly on her back and her arms.  No known exposure.  No new foods.  No new detergents, soaps, or deodorants.  No history of atopy.  No history of asthma or seasonal allergies.  She does have some liquid Atarax that was prescribed quite a while ago for similar symptoms.  She has no known exposure to bug bites or mosquitoes.  The Atarax has been helping.  She is also been using some calamine lotion and over the counter Neosporin and Benadryl cream.  No open wounds.  No fever shakes chills.  No wheezing, no throat tightness, no chest pain or shortness of breath.  No red flag signs or symptoms elicited on history.   Past Medical History:  Diagnosis Date   Absence of bladder continence 12/15/2014   Blood pressure elevated without history of HTN 12/15/2014   Diabetes mellitus without complication (HCC)    Hyperlipidemia    Osteoporosis    Overactive bladder    PMB (postmenopausal bleeding) 11/01/2017   Stroke (HCC)    hemiplegia on left side    Patient Active Problem List   Diagnosis Date Noted   Microalbuminuria due to type 2 diabetes mellitus (HCC) 12/07/2019   Type II diabetes mellitus with complication (HCC) 04/21/2018   Right shoulder pain 03/31/2018   Hyperlipidemia associated with type 2 diabetes mellitus (HCC) 06/12/2017   Tinea corporis 03/17/2015   Abnormal gait 12/15/2014   Altered bowel function 12/15/2014   Hemiparesis affecting left side as late effect of cerebrovascular accident (CVA) (HCC) 12/15/2014   Obstructive sleep apnea of adult 12/15/2014   Arthritis of shoulder region, degenerative 12/15/2014   OP (osteoporosis) 12/15/2014   Allergic rhinitis, seasonal 12/15/2014    Mixed incontinence 03/11/2013    Past Surgical History:  Procedure Laterality Date   CESAREAN SECTION  1975   COLONOSCOPY  2010   normal    OB History   No obstetric history on file.      Home Medications    Prior to Admission medications   Medication Sig Start Date End Date Taking? Authorizing Provider  ACCU-CHEK AVIVA PLUS test strip 1 each by Other route 4 (four) times daily. 11/16/20  Yes Reubin Milan, MD  Accu-Chek Softclix Lancets lancets USE TO CHECK GLUCOSE UP TO 4 TIMES DAILY 12/03/19  Yes Reubin Milan, MD  alendronate (FOSAMAX) 70 MG tablet Take 1 tablet (70 mg total) by mouth once a week. Take with a full glass of water on an empty stomach. 03/02/20  Yes Reubin Milan, MD  aspirin 81 MG chewable tablet Chew 1 tablet by mouth daily.   Yes [provider]  atorvastatin (LIPITOR) 10 MG tablet Take 1 tablet by mouth once daily 02/27/20  Yes Reubin Milan, MD  baclofen (LIORESAL) 10 MG tablet Take 1 tablet (10 mg total) by mouth 2 (two) times daily. 07/06/20  Yes Reubin Milan, MD  Calcium Carbonate-Vitamin D 500-125 MG-UNIT TABS Take 1 tablet by mouth daily.    Yes [provider]  hydrOXYzine (ATARAX/VISTARIL) 25 MG tablet Take 1 tablet (25 mg total) by mouth every 6 (six) hours. 11/23/20  Yes Delton See, MD  lisinopril (ZESTRIL) 2.5 MG tablet Take 1 tablet (2.5 mg total) by mouth daily. 12/07/19  Yes Reubin Milan, MD  metFORMIN (GLUCOPHAGE) 500 MG tablet Take 0.5 tablets (250 mg total) by mouth daily with breakfast. 07/13/20  Yes Reubin Milan, MD  Multiple Vitamins-Minerals (MULTIVITAMIN ADULT PO) Take by mouth.   Yes [provider]  nystatin cream (MYCOSTATIN) Apply 1 application topically 2 (two) times daily. To skin under breast 05/18/20  Yes Reubin Milan, MD    Family History Family History  Problem Relation Age of Onset   Diabetes Mother    Thyroid disease Mother    Breast cancer Maternal Aunt     Breast cancer Cousin        mat cousin   Prostate cancer Brother    Kidney failure Brother    Kidney cancer Son    Bladder Cancer Neg Hx     Social History Social History   Tobacco Use   Smoking status: Never   Smokeless tobacco: Never   Tobacco comments:    smoking cessation materials not required  Vaping Use   Vaping Use: Never used  Substance Use Topics   Alcohol use: No    Alcohol/week: 0.0 standard drinks   Drug use: Never     Allergies   Nitrofurantoin and Iodinated diagnostic agents   Review of Systems Review of Systems  Constitutional:  Negative for activity change, appetite change, chills, diaphoresis, fatigue and fever.  HENT:  Negative for congestion, ear pain, postnasal drip, rhinorrhea, sinus pressure, sinus pain, sneezing, sore throat and trouble swallowing.   Eyes:  Negative for pain.  Respiratory:  Negative for cough, chest tightness, shortness of breath, wheezing and stridor.   Cardiovascular:  Negative for chest pain and palpitations.  Gastrointestinal:  Negative for abdominal pain, diarrhea, nausea and vomiting.  Genitourinary:  Negative for dysuria.  Musculoskeletal:  Negative for back pain, myalgias and neck pain.  Skin:  Positive for rash. Negative for color change, pallor and wound.  Allergic/Immunologic: Negative for environmental allergies and food allergies.  Neurological:  Negative for dizziness, light-headedness and headaches.  All other systems reviewed and are negative.   Physical Exam Triage Vital Signs ED Triage Vitals  Enc Vitals Group     BP 11/23/20 0839 121/75     Pulse Rate 11/23/20 0839 85     Resp 11/23/20 0839 16     Temp 11/23/20 0839 98.2 F (36.8 C)     Temp Source 11/23/20 0839 Oral     SpO2 11/23/20 0839 96 %     Weight 11/23/20 0836 160 lb 15 oz (73 kg)     Height 11/23/20 0836 4\' 10"  (1.473 m)     Head Circumference --      Peak Flow --      Pain Score 11/23/20 0835 0     Pain Loc --      Pain Edu? --       Excl. in GC? --    No data found.  Updated Vital Signs BP 121/75 (BP Location: Right Arm)   Pulse 85   Temp 98.2 F (36.8 C) (Oral)   Resp 16   Ht 4\' 10"  (1.473 m)   Wt 73 kg   SpO2 96%   BMI 33.64 kg/m   Visual Acuity Right Eye Distance:   Left Eye Distance:   Bilateral Distance:    Right Eye Near:   Left Eye Near:  Bilateral Near:     Physical Exam Vitals and nursing note reviewed.  Constitutional:      General: She is not in acute distress.    Appearance: Normal appearance. She is not ill-appearing, toxic-appearing or diaphoretic.  HENT:     Head: Normocephalic and atraumatic.     Nose: Nose normal.     Mouth/Throat:     Mouth: Mucous membranes are moist.  Eyes:     Conjunctiva/sclera: Conjunctivae normal.     Pupils: Pupils are equal, round, and reactive to light.  Cardiovascular:     Rate and Rhythm: Normal rate and regular rhythm.     Pulses: Normal pulses.     Heart sounds: Normal heart sounds. No murmur heard.   No friction rub. No gallop.  Pulmonary:     Effort: Pulmonary effort is normal.     Breath sounds: Normal breath sounds. No stridor. No wheezing, rhonchi or rales.  Musculoskeletal:     Cervical back: Normal range of motion and neck supple.  Skin:    General: Skin is warm and dry.     Capillary Refill: Capillary refill takes less than 2 seconds.     Findings: Rash present. No abrasion, abscess, bruising, ecchymosis, erythema, signs of injury, lesion or petechiae. Rash is macular and papular. Rash is not pustular, urticarial or vesicular.     Comments: Diffuse pinpoint maculopapular rash on the arms and the back consistent with potential contact dermatitis versus a mild allergic reaction.  Neurological:     General: No focal deficit present.     Mental Status: She is alert and oriented to person, place, and time.     UC Treatments / Results  Labs (all labs ordered are listed, but only abnormal results are displayed) Labs Reviewed - No  data to display  EKG   Radiology No results found.  Procedures Procedures (including critical care time)  Medications Ordered in UC Medications - No data to display  Initial Impression / Assessment and Plan / UC Course  I have reviewed the triage vital signs and the nursing notes.  Pertinent labs & imaging results that were available during my care of the patient were reviewed by me and considered in my medical decision making (see chart for details).  Clinical impression: 1.  Diffuse rash on back and arms consistent with potential contact dermatitis. 2.  Pruritus  Treatment plan: 1.  The findings and treatment plan were discussed in detail with the patient.  Patient was in agreement. 2.  I reassured her that the rash was something she may have come in contact with.  She is unaware of what that is.  We will renew her Atarax but I gave her some oral pills.  Warned her of the side effects. 3.  She can continue with the calamine lotion, Benadryl cream, or oral Benadryl but it told her to just take the oral Benadryl at night as it can cause her to be drowsy. 4.  Educational handouts provided. 5.  She tells me that she has an appointment with her primary care provider in August and I encouraged her to keep that and continue to keep in contact with them and if her symptoms worsen give them a call and see if they will see her sooner.  She voiced verbal understanding. 6.  When she was ready to be discharged she mentioned that she has diabetes and sees podiatry for foot.  She stubbed her toe and had a little bit of bleeding  underneath the nailbed.  There is no tenderness or warmth.  I reassured her there was no infection and she should call her podiatry office and schedule a visit for diabetic foot care.  She said she would do that. 7.  She was discharged in stable condition and will follow-up here as needed.     Final Clinical Impressions(s) / UC Diagnoses   Final diagnoses:  Pruritus   Rash and nonspecific skin eruption     Discharge Instructions      As we discussed, I am prescribing a medication for your itchiness. You can use calamine lotion, over-the-counter hydrocortisone cream 1%, topical Benadryl, and oral Benadryl.  The oral Benadryl can make you sleepy so just use it at nighttime.  The medicine I prescribed you can use during the daytime. If you have a sensation that your throat is closing, you have chest pain, shortness of breath, wheezing, or any other concerning symptoms please call 911 and go to the ER. Please see educational handouts. You reported to me that you have an appointment with your primary care physician in August and I would encourage you to keep that.  If your symptoms persist please call them and see if you can get in sooner.     ED Prescriptions     Medication Sig Dispense Auth. Provider   hydrOXYzine (ATARAX/VISTARIL) 25 MG tablet Take 1 tablet (25 mg total) by mouth every 6 (six) hours. 30 tablet Delton SeeBarnes, Nyanna Heideman, MD      PDMP not reviewed this encounter.   Delton SeeBarnes, Ibn Stief, MD 11/23/20 [id number]

## 2020-12-01 ENCOUNTER — Encounter: Payer: Self-pay | Admitting: Internal Medicine

## 2020-12-01 ENCOUNTER — Other Ambulatory Visit: Payer: Self-pay

## 2020-12-01 ENCOUNTER — Ambulatory Visit (INDEPENDENT_AMBULATORY_CARE_PROVIDER_SITE_OTHER): Payer: Medicare Other | Admitting: Internal Medicine

## 2020-12-01 VITALS — BP 106/74 | HR 81 | Temp 98.2°F | Ht <= 58 in | Wt 165.0 lb

## 2020-12-01 DIAGNOSIS — L299 Pruritus, unspecified: Secondary | ICD-10-CM

## 2020-12-01 DIAGNOSIS — Z23 Encounter for immunization: Secondary | ICD-10-CM

## 2020-12-01 DIAGNOSIS — M25561 Pain in right knee: Secondary | ICD-10-CM

## 2020-12-01 MED ORDER — SHINGRIX 50 MCG/0.5ML IM SUSR: 0.5000 mL | Freq: Once | INTRAMUSCULAR | 1 refills | Status: AC

## 2020-12-01 MED ORDER — HYDROXYZINE HCL 25 MG PO TABS: 25.0000 mg | ORAL_TABLET | ORAL | 0 refills | Status: DC | PRN

## 2020-12-01 NOTE — Patient Instructions (Signed)
Use arthritis gel on knee 4 times per day.  Rest the knee (avoid stairs) for the next few days then resume activity as tolerated.

## 2020-12-01 NOTE — Progress Notes (Signed)
Date:  12/01/2020   Name:  Sharon York   DOB:  1946-01-07   MRN:  [id number]   Chief Complaint: Rash (Red, itching, went to [facility] 7/6 they gave her hydroxyzine for pruritus, pt stated it still itching, left arm up to shoulder, nack or neck and legs, spreading ) and Knee Pain (X3 days, Right knee, couldn't get her leg up after she made it half way up the steps, had to turn around and sit down, has used cream for arthritis which did not work )  Rash This is a new problem. The current episode started in the past 7 days. The problem has been gradually improving since onset. The affected locations include the neck, left arm and right lower leg. The rash is characterized by itchiness and redness. Associated with: possibly insects - onset after going fishing. Pertinent negatives include no fatigue, fever or shortness of breath. Past treatments include antihistamine. The treatment provided moderate relief.  Knee Pain  The incident occurred 2 days ago. The incident occurred at home. There was no injury mechanism (right knee locked up while going up the stairs). The pain is present in the right knee. The pain is mild. The pain has been Improving since onset.   Lab Results  Component Value Date   CREATININE 0.74 12/03/2019   BUN 11 12/03/2019   NA 142 12/03/2019   K 4.5 12/03/2019   CL 99 12/03/2019   CO2 29 12/03/2019   Lab Results  Component Value Date   CHOL 171 12/03/2019   HDL 63 12/03/2019   LDLCALC 85 12/03/2019   TRIG 135 12/03/2019   CHOLHDL 2.7 12/03/2019   Lab Results  Component Value Date   TSH 3.740 01/16/2019   Lab Results  Component Value Date   HGBA1C 6.4 (A) 07/06/2020   Lab Results  Component Value Date   WBC 5.9 01/16/2019   HGB 12.1 01/16/2019   HCT 38.9 01/16/2019   MCV 74 (L) 01/16/2019   PLT 275 01/16/2019   Lab Results  Component Value Date   ALT 13 12/03/2019   AST 24 12/03/2019   ALKPHOS 51 12/03/2019   BILITOT 0.3 12/03/2019     Review of  Systems  Constitutional:  Negative for chills, fatigue and fever.  Respiratory:  Negative for chest tightness and shortness of breath.   Cardiovascular:  Negative for chest pain.  Musculoskeletal:  Positive for arthralgias and gait problem.  Skin:  Positive for rash.  Neurological:  Positive for weakness (secondary to stroke).  Psychiatric/Behavioral:  Negative for sleep disturbance.    Patient Active Problem List   Diagnosis Date Noted   Microalbuminuria due to type 2 diabetes mellitus (HCC) 12/07/2019   Type II diabetes mellitus with complication (HCC) 04/21/2018   Right shoulder pain 03/31/2018   Hyperlipidemia associated with type 2 diabetes mellitus (HCC) 06/12/2017   Tinea corporis 03/17/2015   Abnormal gait 12/15/2014   Altered bowel function 12/15/2014   Hemiparesis affecting left side as late effect of cerebrovascular accident (CVA) (HCC) 12/15/2014   Obstructive sleep apnea of adult 12/15/2014   Arthritis of shoulder region, degenerative 12/15/2014   OP (osteoporosis) 12/15/2014   Allergic rhinitis, seasonal 12/15/2014   Mixed incontinence 03/11/2013    Allergies  Allergen Reactions   Nitrofurantoin Nausea Only   Iodinated Diagnostic Agents Hives and Cough    Patient developed a hive on her left lip after injection as well as some coughing.     Past Surgical History:  Procedure Laterality Date   CESAREAN SECTION  1975   COLONOSCOPY  2010   normal    Social History   Tobacco Use   Smoking status: Never   Smokeless tobacco: Never   Tobacco comments:    smoking cessation materials not required  Vaping Use   Vaping Use: Never used  Substance Use Topics   Alcohol use: No    Alcohol/week: 0.0 standard drinks   Drug use: Never     Medication list has been reviewed and updated.  Current Meds  Medication Sig   ACCU-CHEK AVIVA PLUS test strip 1 each by Other route 4 (four) times daily.   Accu-Chek Softclix Lancets lancets USE TO CHECK GLUCOSE UP TO 4 TIMES  DAILY   alendronate (FOSAMAX) 70 MG tablet Take 1 tablet (70 mg total) by mouth once a week. Take with a full glass of water on an empty stomach.   aspirin 81 MG chewable tablet Chew 1 tablet by mouth daily.   atorvastatin (LIPITOR) 10 MG tablet Take 1 tablet by mouth once daily   baclofen (LIORESAL) 10 MG tablet Take 1 tablet (10 mg total) by mouth 2 (two) times daily.   Calcium Carbonate-Vitamin D 500-125 MG-UNIT TABS Take 1 tablet by mouth daily.    hydrOXYzine (ATARAX/VISTARIL) 25 MG tablet Take 1 tablet (25 mg total) by mouth every 6 (six) hours.   lisinopril (ZESTRIL) 2.5 MG tablet Take 1 tablet (2.5 mg total) by mouth daily.   metFORMIN (GLUCOPHAGE) 500 MG tablet Take 0.5 tablets (250 mg total) by mouth daily with breakfast.   Multiple Vitamins-Minerals (MULTIVITAMIN ADULT PO) Take by mouth.   nystatin cream (MYCOSTATIN) Apply 1 application topically 2 (two) times daily. To skin under breast    PHQ 2/9 Scores 12/01/2020 07/11/2020 07/06/2020 07/04/2020  PHQ - 2 Score 0 0 0 0  PHQ- 9 Score 3 - 0 0    GAD 7 : Generalized Anxiety Score 12/01/2020 07/06/2020 07/04/2020 05/18/2020  Nervous, Anxious, on Edge 0 0 0 0  Control/stop worrying 1 0 0 0  Worry too much - different things 1 0 0 0  Trouble relaxing 0 0 0 0  Restless 0 0 0 0  Easily annoyed or irritable 0 0 0 0  Afraid - awful might happen 0 0 0 0  Total GAD 7 Score 2 0 0 0  Anxiety Difficulty - Not difficult at all Not difficult at all Not difficult at all    BP Readings from Last 3 Encounters:  12/01/20 106/74  11/23/20 121/75  07/06/20 124/82    Physical Exam Constitutional:      Appearance: Normal appearance.  Cardiovascular:     Rate and Rhythm: Normal rate and regular rhythm.     Heart sounds: No murmur heard. Pulmonary:     Effort: Pulmonary effort is normal.     Breath sounds: No wheezing or rhonchi.  Musculoskeletal:     Right knee: Crepitus present. Tenderness present over the medial joint line and lateral  joint line. No LCL laxity or MCL laxity.     Right lower leg: Normal. No edema.     Left lower leg: No edema.  Skin:    General: Skin is warm and dry.     Findings: Rash present.     Comments: Red macular rash clustered on posterior neck, left upper arm and right ankle   Neurological:     Mental Status: She is alert.    Wt Readings from Last 3 Encounters:  12/01/20 165 lb (74.8 kg)  11/23/20 160 lb 15 oz (73 kg)  07/06/20 161 lb (73 kg)    BP 106/74   Pulse 81   Temp 98.2 F (36.8 C) (Oral)   Ht 4\' 10"  (1.473 m)   Wt 165 lb (74.8 kg)   SpO2 93%   BMI 34.49 kg/m   Assessment and Plan: 1. Pruritic disorder Suspect insect bites - they are improving Continue atarax as needed - hydrOXYzine (ATARAX/VISTARIL) 25 MG tablet; Take 1 tablet (25 mg total) by mouth every 4 (four) hours as needed.  Dispense: 40 tablet; Refill: 0  2. Acute pain of right knee Now improving Recommend several days rest; topical nsaids tid Follow up if recurrent/worsening  Partially dictated using . Any errors are unintentional.  Animal nutritionist, MD [facility]  12/01/2020

## 2020-12-06 ENCOUNTER — Ambulatory Visit: Payer: Self-pay

## 2020-12-06 NOTE — Telephone Encounter (Signed)
Called and spoke with patient. Informed her rash is bug bites which are not contagious and per Dr Judithann Riga she can go to the pool if she would like. Patient said " sounds good" and thanked me.

## 2020-12-06 NOTE — Telephone Encounter (Signed)
Patient called and she says that she has a rash that she saw Dr. Judithann Kamphuis for and went to the [facility] about that is clearing up. She says the rash still itch, but is clearing up. She wants to know if she's contagious and if she is able to get in the pool. I advised I will send this message to Dr. Judithann Garraway and someone will call back with her recommendation. Patient verbalized understanding.   Reason for Disposition  [1] Caller requesting NON-URGENT health information AND [2] PCP's office is the best resource  Answer Assessment - Initial Assessment Questions 1. REASON FOR CALL or QUESTION: "What is your reason for calling today?" or "How can I best help you?" or "What question do you have that I can help answer?"     I have some questions about the rash I have  Protocols used: Information Only Call - No Triage-A-AH

## 2020-12-07 ENCOUNTER — Other Ambulatory Visit: Payer: Self-pay

## 2020-12-07 ENCOUNTER — Encounter: Payer: Self-pay | Admitting: Internal Medicine

## 2020-12-07 ENCOUNTER — Ambulatory Visit (INDEPENDENT_AMBULATORY_CARE_PROVIDER_SITE_OTHER): Payer: Medicare Other | Admitting: Internal Medicine

## 2020-12-07 VITALS — BP 125/82 | HR 76 | Ht <= 58 in | Wt 165.0 lb

## 2020-12-07 DIAGNOSIS — E1129 Type 2 diabetes mellitus with other diabetic kidney complication: Secondary | ICD-10-CM

## 2020-12-07 DIAGNOSIS — E118 Type 2 diabetes mellitus with unspecified complications: Secondary | ICD-10-CM

## 2020-12-07 DIAGNOSIS — E785 Hyperlipidemia, unspecified: Secondary | ICD-10-CM

## 2020-12-07 DIAGNOSIS — Z0000 Encounter for general adult medical examination without abnormal findings: Secondary | ICD-10-CM | POA: Diagnosis not present

## 2020-12-07 DIAGNOSIS — Z1231 Encounter for screening mammogram for malignant neoplasm of breast: Secondary | ICD-10-CM

## 2020-12-07 DIAGNOSIS — I69354 Hemiplegia and hemiparesis following cerebral infarction affecting left non-dominant side: Secondary | ICD-10-CM | POA: Diagnosis not present

## 2020-12-07 DIAGNOSIS — E1169 Type 2 diabetes mellitus with other specified complication: Secondary | ICD-10-CM

## 2020-12-07 DIAGNOSIS — M810 Age-related osteoporosis without current pathological fracture: Secondary | ICD-10-CM | POA: Diagnosis not present

## 2020-12-07 DIAGNOSIS — R809 Proteinuria, unspecified: Secondary | ICD-10-CM | POA: Diagnosis not present

## 2020-12-07 LAB — POCT URINALYSIS DIPSTICK
Bilirubin, UA: NEGATIVE
Blood, UA: NEGATIVE
Glucose, UA: NEGATIVE
Ketones, UA: NEGATIVE
Leukocytes, UA: NEGATIVE
Nitrite, UA: NEGATIVE
Protein, UA: NEGATIVE
Spec Grav, UA: <=1.005 — AB (ref 1.010–1.025)
Urobilinogen, UA: 0.2 E.U./dL
pH, UA: 7.5 (ref 5.0–8.0)

## 2020-12-07 LAB — POCT UA - MICROALBUMIN: Microalbumin Ur, POC: NEGATIVE mg/L

## 2020-12-07 MED ORDER — METFORMIN HCL 500 MG PO TABS: 500.0000 mg | ORAL_TABLET | Freq: Every day | ORAL | 1 refills | Status: DC

## 2020-12-07 MED ORDER — LISINOPRIL 2.5 MG PO TABS: 2.5000 mg | ORAL_TABLET | Freq: Every day | ORAL | 3 refills | Status: DC

## 2020-12-07 NOTE — Progress Notes (Signed)
Date:  12/07/2020   Name:  Sharon York   DOB:  May 15, 1946   MRN:  [id number]   Chief Complaint: Annual Exam Sharon York is a 75 y.o. female who presents today for her Complete Annual Exam. She feels well. She reports exercising - none. She reports she is sleeping well. Breast complaints - none.  Rash/itching is improved with Vistaril.  Mammogram: 02/2020 DEXA: 07/2020 osteopenia Colonoscopy: FIT neg 2019  Immunization History  Administered Date(s) Administered   Fluad Quad(high Dose 65+) 01/16/2019, 02/11/2020   Influenza, High Dose Seasonal PF 02/06/2017, 01/22/2018   Influenza,inj,Quad PF,6+ Mos 02/18/2015   PFIZER(Purple Top)SARS-COV-2 Vaccination 06/01/2019, 06/22/2019, 02/25/2020   Pneumococcal Conjugate-13 06/22/2016   Pneumococcal Polysaccharide-23 06/11/2017   Tdap 07/10/2018    Diabetes She presents for her follow-up diabetic visit. She has type 2 diabetes mellitus. Her disease course has been stable. Pertinent negatives for hypoglycemia include no dizziness, headaches, nervousness/anxiousness or tremors. Associated symptoms include weakness (2/2 stroke). Pertinent negatives for diabetes include no chest pain, no fatigue, no polydipsia and no polyuria. Symptoms are stable. Current diabetic treatment includes diet and oral agent (monotherapy) (metformin 500 mg once a day). She is compliant with treatment most of the time. Her weight is stable. Her breakfast blood glucose is taken between 6-7 am. Her breakfast blood glucose range is generally 110-130 mg/dl. An ACE inhibitor/angiotensin II receptor blocker is being taken. Eye exam is not current.  Hyperlipidemia This is a chronic problem. The problem is controlled. Pertinent negatives include no chest pain or shortness of breath. Current antihyperlipidemic treatment includes statins. The current treatment provides significant improvement of lipids.   Lab Results  Component Value Date   CREATININE 0.74 12/03/2019   BUN  11 12/03/2019   NA 142 12/03/2019   K 4.5 12/03/2019   CL 99 12/03/2019   CO2 29 12/03/2019   Lab Results  Component Value Date   CHOL 171 12/03/2019   HDL 63 12/03/2019   LDLCALC 85 12/03/2019   TRIG 135 12/03/2019   CHOLHDL 2.7 12/03/2019   Lab Results  Component Value Date   TSH 3.740 01/16/2019   Lab Results  Component Value Date   HGBA1C 6.4 (A) 07/06/2020   Lab Results  Component Value Date   WBC 5.9 01/16/2019   HGB 12.1 01/16/2019   HCT 38.9 01/16/2019   MCV 74 (L) 01/16/2019   PLT 275 01/16/2019   Lab Results  Component Value Date   ALT 13 12/03/2019   AST 24 12/03/2019   ALKPHOS 51 12/03/2019   BILITOT 0.3 12/03/2019  Last vitamin D No results found for: 25OHVITD2, 25OHVITD3, VD25OH    Review of Systems  Constitutional:  Negative for chills, fatigue and fever.  HENT:  Negative for congestion, hearing loss, tinnitus, trouble swallowing and voice change.   Eyes:  Negative for visual disturbance.  Respiratory:  Negative for cough, chest tightness, shortness of breath and wheezing.   Cardiovascular:  Negative for chest pain, palpitations and leg swelling.  Gastrointestinal:  Negative for abdominal pain, constipation, diarrhea and vomiting.  Endocrine: Negative for polydipsia and polyuria.  Genitourinary:  Negative for dysuria, frequency, genital sores, vaginal bleeding and vaginal discharge.  Musculoskeletal:  Negative for arthralgias, gait problem and joint swelling.  Skin:  Negative for color change and rash.  Neurological:  Positive for weakness (2/2 stroke). Negative for dizziness, tremors, light-headedness and headaches.  Hematological:  Negative for adenopathy. Does not bruise/bleed easily.  Psychiatric/Behavioral:  Negative for dysphoric mood  and sleep disturbance. The patient is not nervous/anxious.    Patient Active Problem List   Diagnosis Date Noted   Microalbuminuria due to type 2 diabetes mellitus (HCC) 12/07/2019   Type II diabetes  mellitus with complication (HCC) 04/21/2018   Right shoulder pain 03/31/2018   Hyperlipidemia associated with type 2 diabetes mellitus (HCC) 06/12/2017   Tinea corporis 03/17/2015   Abnormal gait 12/15/2014   Altered bowel function 12/15/2014   Hemiparesis affecting left side as late effect of cerebrovascular accident (CVA) (HCC) 12/15/2014   Obstructive sleep apnea of adult 12/15/2014   Arthritis of shoulder region, degenerative 12/15/2014   OP (osteoporosis) 12/15/2014   Allergic rhinitis, seasonal 12/15/2014   Mixed incontinence 03/11/2013    Allergies  Allergen Reactions   Nitrofurantoin Nausea Only   Iodinated Diagnostic Agents Hives and Cough    Patient developed a hive on her left lip after injection as well as some coughing.     Past Surgical History:  Procedure Laterality Date   CESAREAN SECTION  1975   COLONOSCOPY  2010   normal    Social History   Tobacco Use   Smoking status: Never   Smokeless tobacco: Never   Tobacco comments:    smoking cessation materials not required  Vaping Use   Vaping Use: Never used  Substance Use Topics   Alcohol use: No    Alcohol/week: 0.0 standard drinks   Drug use: Never     Medication list has been reviewed and updated.  Current Meds  Medication Sig   ACCU-CHEK AVIVA PLUS test strip 1 each by Other route 4 (four) times daily.   Accu-Chek Softclix Lancets lancets USE TO CHECK GLUCOSE UP TO 4 TIMES DAILY   alendronate (FOSAMAX) 70 MG tablet Take 1 tablet (70 mg total) by mouth once a week. Take with a full glass of water on an empty stomach.   aspirin 81 MG chewable tablet Chew 1 tablet by mouth daily.   atorvastatin (LIPITOR) 10 MG tablet Take 1 tablet by mouth once daily   baclofen (LIORESAL) 10 MG tablet Take 1 tablet (10 mg total) by mouth 2 (two) times daily.   Calcium Carbonate-Vitamin D 500-125 MG-UNIT TABS Take 1 tablet by mouth daily.    hydrOXYzine (ATARAX/VISTARIL) 25 MG tablet Take 1 tablet (25 mg total) by  mouth every 4 (four) hours as needed.   Multiple Vitamins-Minerals (MULTIVITAMIN ADULT PO) Take by mouth.   nystatin cream (MYCOSTATIN) Apply 1 application topically 2 (two) times daily. To skin under breast   [DISCONTINUED] lisinopril (ZESTRIL) 2.5 MG tablet Take 1 tablet (2.5 mg total) by mouth daily.   [DISCONTINUED] metFORMIN (GLUCOPHAGE) 500 MG tablet Take 0.5 tablets (250 mg total) by mouth daily with breakfast.    PHQ 2/9 Scores 12/07/2020 12/01/2020 07/11/2020 07/06/2020  PHQ - 2 Score 0 0 0 0  PHQ- 9 Score 0 3 - 0    GAD 7 : Generalized Anxiety Score 12/07/2020 12/01/2020 07/06/2020 07/04/2020  Nervous, Anxious, on Edge 0 0 0 0  Control/stop worrying 0 1 0 0  Worry too much - different things 0 1 0 0  Trouble relaxing 0 0 0 0  Restless 0 0 0 0  Easily annoyed or irritable 0 0 0 0  Afraid - awful might happen 0 0 0 0  Total GAD 7 Score 0 2 0 0  Anxiety Difficulty Not difficult at all - Not difficult at all Not difficult at all    BP Readings from Last  3 Encounters:  12/07/20 125/82  12/01/20 106/74  11/23/20 121/75    Physical Exam Vitals and nursing note reviewed.  Constitutional:      General: She is not in acute distress.    Appearance: She is well-developed.  HENT:     Head: Normocephalic and atraumatic.     Right Ear: Tympanic membrane and ear canal normal. Decreased hearing noted.     Left Ear: Decreased hearing noted. A middle ear effusion is present.     Nose:     Right Sinus: No maxillary sinus tenderness.     Left Sinus: No maxillary sinus tenderness.  Eyes:     General: No scleral icterus.       Right eye: No discharge.        Left eye: No discharge.     Conjunctiva/sclera: Conjunctivae normal.  Neck:     Thyroid: No thyromegaly.     Vascular: No carotid bruit.  Cardiovascular:     Rate and Rhythm: Normal rate and regular rhythm.     Pulses: Normal pulses.     Heart sounds: Normal heart sounds.  Pulmonary:     Effort: Pulmonary effort is normal. No  respiratory distress.     Breath sounds: No wheezing.  Abdominal:     General: Bowel sounds are normal.     Palpations: Abdomen is soft.     Tenderness: There is no abdominal tenderness.  Musculoskeletal:     Cervical back: Normal range of motion. No erythema.     Right lower leg: No edema.     Left lower leg: No edema.  Lymphadenopathy:     Cervical: No cervical adenopathy.  Skin:    General: Skin is warm and dry.     Capillary Refill: Capillary refill takes less than 2 seconds.     Findings: No rash.  Neurological:     Mental Status: She is alert and oriented to person, place, and time. Mental status is at baseline.     Cranial Nerves: No cranial nerve deficit.     Sensory: No sensory deficit.     Motor: Weakness present.     Gait: Gait abnormal.     Deep Tendon Reflexes: Reflexes are normal and symmetric.  Psychiatric:        Attention and Perception: Attention normal.        Mood and Affect: Mood normal.    Wt Readings from Last 3 Encounters:  12/07/20 165 lb (74.8 kg)  12/01/20 165 lb (74.8 kg)  11/23/20 160 lb 15 oz (73 kg)    BP 125/82 (BP Location: Right Arm, Patient Position: Sitting, Cuff Size: Normal)   Pulse 76   Ht 4\' 10"  (1.473 m)   Wt 165 lb (74.8 kg)   SpO2 97%   BMI 34.49 kg/m   Assessment and Plan: 1. Annual physical exam Exam stable at baseline Up to date on screenings and immunizations Still considering the [place] vaccine (has Rx) - CBC with Differential/Platelet - Comprehensive metabolic panel  2. Encounter for screening mammogram for breast cancer Due in March - MM 3D SCREEN BREAST BILATERAL; Future  3. Type II diabetes mellitus with complication (HCC) Clinically stable by exam and report without s/s of hypoglycemia. DM complicated by proteinuria and dyslipidemia. Tolerating medications well without side effects or other concerns. - Hemoglobin A1c - TSH - POCT urinalysis dipstick - POCT UA - Microalbumin - metFORMIN (GLUCOPHAGE) 500  MG tablet; Take 1 tablet (500 mg total) by mouth daily with  breakfast.  Dispense: 90 tablet; Refill: 1 - lisinopril (ZESTRIL) 2.5 MG tablet; Take 1 tablet (2.5 mg total) by mouth daily.  Dispense: 90 tablet; Refill: 3  4. Hyperlipidemia associated with type 2 diabetes mellitus (HCC) Tolerating statin medication without side effects at this time LDL is almost at goal of < 70 on current dose.  Continue same therapy without change at this time. Work on [employer]. - Lipid panel  5. Hemiparesis affecting left side as late effect of cerebrovascular accident (CVA) (HCC) Stable; continue ASA 81 mg daily  6. Microalbuminuria due to type 2 diabetes mellitus (HCC) Continue low dose ACEI  7. Age-related osteoporosis without current pathological fracture On Alendronate without side effects.  Continue calcium and vitamin D DEXA this year was improved. Repeat scan in 2024   Partially dictated using Dragon software. Any errors are unintentional.  Bari Edward, MD [facility]  12/07/2020

## 2020-12-08 LAB — COMPREHENSIVE METABOLIC PANEL
ALT: 15 IU/L (ref 0–32)
AST: 21 IU/L (ref 0–40)
Albumin/Globulin Ratio: 1.5 (ref 1.2–2.2)
Albumin: 4.3 g/dL (ref 3.7–4.7)
Alkaline Phosphatase: 52 IU/L (ref 44–121)
BUN/Creatinine Ratio: 13 (ref 12–28)
BUN: 9 mg/dL (ref 8–27)
Bilirubin Total: 0.3 mg/dL (ref 0.0–1.2)
CO2: 27 mmol/L (ref 20–29)
Calcium: 9.9 mg/dL (ref 8.7–10.3)
Chloride: 102 mmol/L (ref 96–106)
Creatinine, Ser: 0.68 mg/dL (ref 0.57–1.00)
Globulin, Total: 2.9 g/dL (ref 1.5–4.5)
Glucose: 102 mg/dL — ABNORMAL HIGH (ref 65–99)
Potassium: 4.7 mmol/L (ref 3.5–5.2)
Sodium: 140 mmol/L (ref 134–144)
Total Protein: 7.2 g/dL (ref 6.0–8.5)
eGFR: 90 mL/min/{1.73_m2} (ref >59)

## 2020-12-08 LAB — CBC WITH DIFFERENTIAL/PLATELET
Basophils Absolute: 0 10*3/uL (ref 0.0–0.2)
Basos: 0 %
EOS (ABSOLUTE): 0.2 10*3/uL (ref 0.0–0.4)
Eos: 4 %
Hematocrit: 39.2 % (ref 34.0–46.6)
Hemoglobin: 12.4 g/dL (ref 11.1–15.9)
Immature Grans (Abs): 0 10*3/uL (ref 0.0–0.1)
Immature Granulocytes: 0 %
Lymphocytes Absolute: 2.5 10*3/uL (ref 0.7–3.1)
Lymphs: 42 %
MCH: 23 pg — ABNORMAL LOW (ref 26.6–33.0)
MCHC: 31.6 g/dL (ref 31.5–35.7)
MCV: 73 fL — ABNORMAL LOW (ref 79–97)
Monocytes Absolute: 0.5 10*3/uL (ref 0.1–0.9)
Monocytes: 9 %
Neutrophils Absolute: 2.7 10*3/uL (ref 1.4–7.0)
Neutrophils: 45 %
Platelets: 300 10*3/uL (ref 150–450)
RBC: 5.39 x10E6/uL — ABNORMAL HIGH (ref 3.77–5.28)
RDW: 16.2 % — ABNORMAL HIGH (ref 11.7–15.4)
WBC: 6 10*3/uL (ref 3.4–10.8)

## 2020-12-08 LAB — HEMOGLOBIN A1C
Est. average glucose Bld gHb Est-mCnc: 160 mg/dL
Hgb A1c MFr Bld: 7.2 % — ABNORMAL HIGH (ref 4.8–5.6)

## 2020-12-08 LAB — TSH: TSH: 3.62 u[IU]/mL (ref 0.450–4.500)

## 2020-12-08 LAB — LIPID PANEL
Chol/HDL Ratio: 2.4 ratio (ref 0.0–4.4)
Cholesterol, Total: 172 mg/dL (ref 100–199)
HDL: 71 mg/dL (ref >39)
LDL Chol Calc (NIH): 86 mg/dL (ref 0–99)
Triglycerides: 81 mg/dL (ref 0–149)
VLDL Cholesterol Cal: 15 mg/dL (ref 5–40)

## 2020-12-19 ENCOUNTER — Other Ambulatory Visit: Payer: Self-pay

## 2020-12-19 ENCOUNTER — Ambulatory Visit (INDEPENDENT_AMBULATORY_CARE_PROVIDER_SITE_OTHER): Payer: Medicare Other | Admitting: Urology

## 2020-12-19 ENCOUNTER — Encounter: Payer: Self-pay | Admitting: Urology

## 2020-12-19 DIAGNOSIS — R350 Frequency of micturition: Secondary | ICD-10-CM | POA: Diagnosis not present

## 2020-12-19 NOTE — Progress Notes (Signed)
Patient ID: Sharon York, female   DOB: Aug 10, 1945, 75 y.o.   MRN: [id number] PTNS  Session # monthly 18 of 45  Health & Social Factors: no change Caffeine: 0 Alcohol: 0 Daytime voids #per day: 5 Night-time voids #per night: 1 Urgency: mild Incontinence Episodes #per day: 0 Ankle used: right Treatment Setting: 10 Feeling/ Response: both Comments: pt tolerated well  Performed By: Mervin Hack, CMA   Follow Up: 1 month #19 of 45

## 2021-01-06 ENCOUNTER — Telehealth: Payer: Self-pay

## 2021-01-06 NOTE — Telephone Encounter (Signed)
Called pt- advised to get 2nd booster

## 2021-01-06 NOTE — Telephone Encounter (Signed)
Copied from CRM [id number]. Topic: General - Other >> Jan 06, 2021 10:43 AM Wyonia Hough E wrote: Reason for CRM: Pt wanted to know if Dr. Judithann Peragine advises if she gets a 2nd covid booster vaccine  / please advise

## 2021-01-16 ENCOUNTER — Other Ambulatory Visit: Payer: Self-pay | Admitting: *Deleted

## 2021-01-16 NOTE — Patient Outreach (Signed)
Sharon York) Care Management  [place]  01/16/2021   Sharon York November 16, 1945 [id number]  RN Health Coach telephone call to patient.  Hipaa compliance verified. Per patient her fasting blood sugar was 147. Patient appetite is good. Per patient she is having some difficulty with portion control. Patient stated that is why her blood sugar was elevated. Patient A1C is 7.2 elevated from 6.4. Per patient she is walking in the evening. She goes to gym twice a week and exercises on the treadmill. Per patient she has not had any recent  falls, Patient stated she uses a cane or rollator to walk. Patient stated that she wears a brace. Patient has agreed to follow up outreach call.   Encounter Medications:  Outpatient Encounter Medications as of 01/16/2021  Medication Sig   metFORMIN (GLUCOPHAGE) 500 MG tablet Take 1 tablet (500 mg total) by mouth daily with breakfast.   ACCU-CHEK AVIVA PLUS test strip 1 each by Other route 4 (four) times daily.   Accu-Chek Softclix Lancets lancets USE TO CHECK GLUCOSE UP TO 4 TIMES DAILY   alendronate (FOSAMAX) 70 MG tablet Take 1 tablet (70 mg total) by mouth once a week. Take with a full glass of water on an empty stomach.   aspirin 81 MG chewable tablet Chew 1 tablet by mouth daily.   atorvastatin (LIPITOR) 10 MG tablet Take 1 tablet by mouth once daily   baclofen (LIORESAL) 10 MG tablet Take 1 tablet (10 mg total) by mouth 2 (two) times daily.   Calcium Carbonate-Vitamin D 500-125 MG-UNIT TABS Take 1 tablet by mouth daily.    hydrOXYzine (ATARAX/VISTARIL) 25 MG tablet Take 1 tablet (25 mg total) by mouth every 4 (four) hours as needed.   lisinopril (ZESTRIL) 2.5 MG tablet Take 1 tablet (2.5 mg total) by mouth daily.   Multiple Vitamins-Minerals (MULTIVITAMIN ADULT PO) Take by mouth.   nystatin cream (MYCOSTATIN) Apply 1 application topically 2 (two) times daily. To skin under breast   No facility-administered encounter medications on  file as of 01/16/2021.    Functional Status:  In your present state of health, do you have any difficulty performing the following activities: 12/07/2020 12/01/2020  Hearing? Y Y  Comment - -  Vision? N N  Difficulty concentrating or making decisions? N N  Walking or climbing stairs? Y Y  Dressing or bathing? Y Y  Doing errands, shopping? Y Y  Preparing Food and eating ? - -  Using the Toilet? - -  In the past six months, have you accidently leaked urine? - -  Comment - -  Do you have problems with loss of bowel control? - -  Managing your Medications? - -  Managing your Finances? - -  Housekeeping or managing your Housekeeping? - -  Some recent data might be hidden    Fall/Depression Screening: Fall Risk  01/16/2021 12/07/2020 12/01/2020  Falls in the past year? 0 0 0  Comment - - -  Number falls in past yr: 0 0 0  Injury with Fall? 0 0 0  Risk Factor Category  - - -  Risk for fall due to : History of fall(s);Impaired balance/gait;Impaired mobility No Fall Risks No Fall Risks  Risk for fall due to: Comment - - -  Follow up Falls evaluation completed Falls evaluation completed Falls evaluation completed   PHQ 2/9 Scores 12/07/2020 12/01/2020 07/11/2020 07/06/2020 07/04/2020 05/18/2020 12/03/2019  PHQ - 2 Score 0 0 0 0 0 0 0  PHQ- 9 Score 0 3 - 0 0 0 0    Assessment:   Care Plan Care Plan : Diabetes Type 2 (Adult)  Updates made by Verlin Grills, RN since 01/16/2021 12:00 AM     Problem: Glycemic Management (Diabetes, Type 2)   Priority: Medium  Onset Date: 05/17/2020     Long-Range Goal: Glycemic Management Optimized   Start Date: 03/04/2020  Expected End Date: 05/19/2021  Recent Progress: On track  Priority: High  Note:   Evidence-based guidance:  Anticipate A1C testing (point-of-care) every 3 to 6 months based on goal attainment.  Review mutually-set A1C goal or target range.  Anticipate use of antihyperglycemic with or without insulin and periodic adjustments;  consider active involvement of pharmacist.  Provide medical nutrition therapy and development of individualized eating.  Compare self-reported symptoms of hypo or hyperglycemia to blood glucose levels, diet and fluid intake, current medications, psychosocial and physiologic stressors, change in activity and barriers to care adherence.  Promote self-monitoring of blood glucose levels.  Assess and address barriers to management plan, such as food insecurity, age, developmental ability, depression, anxiety, fear of hypoglycemia or weight gain, as well as medication cost, side effects and complicated regimen.  Consider referral to community-based diabetes education program, visiting nurse, community health worker or health coach.  Encourage regular dental care for treatment of periodontal disease; refer to dental provider when needed.   Notes:  [id number] Patient is going to center to exercise, and have socialization and lunch twice a week.    Task: Alleviate Barriers to Glycemic Management   Due Date: 05/19/2021  Note:                         Care Management Activities:    - blood glucose monitoring encouraged - blood glucose readings reviewed - mutual A1C goal set or reviewed - self-awareness of signs/symptoms of hypo or hyperglycemia encouraged - use of blood glucose monitoring log promoted    Notes:     Problem: Disease Progression (Diabetes, Type 2)   Priority: Medium  Onset Date: 05/19/2021     Long-Range Goal: Disease Progression Prevented or Minimized   Start Date: 03/04/2020  Expected End Date: 05/19/2021  This Visit's Progress: On track  Recent Progress: On track  Priority: Medium  Note:   Evidence-based guidance:  Prepare patient for laboratory and diagnostic exams based on risk and presentation.  Encourage lifestyle changes, such as increased intake of plant-based foods, stress reduction, consistent physical activity and smoking cessation to prevent long-term  complications and chronic disease.   Individualize activity and exercise recommendations while considering potential limitations, such as neuropathy, retinopathy or the ability to prevent hyperglycemia or hypoglycemia.   Prepare patient for use of pharmacologic therapy that may include antihypertensive, analgesic, prostaglandin E1 with periodic adjustments, based on presenting chronic condition and laboratory results.  Assess signs/symptoms and risk factors for hypertension, sleep-disordered breathing, neuropathy (including changes in gait and balance), retinopathy, nephropathy and sexual dysfunction.  Address pregnancy planning and contraceptive choice, especially when prescribing antihypertensive or statin.  Ensure completion of annual comprehensive foot exam and dilated eye exam.   Implement additional individualized goals and interventions based on identified risk factors.  Prepare patient for consultation or referral for specialist care, such as ophthalmology, neurology, cardiology, podiatry, nephrology or perinatology.   Notes:  [id number] Patient is       Goals Addressed             This  Visit's Progress    (THN)Learn More About My Health   On track    Timeframe:  Long-Range Goal Priority:  Medium Start Date:      33825053                       Expected End Date:         97673419            Follow Up Date  37902409 - ask questions - repeat what I heard to make sure I understand - bring a list of my medicines to the visit    Why is this important?   The best way to learn about your health and care is by talking to the doctor and nurse.  They will answer your questions and give you information in the way that you like best.    Notes:  [id number] Patient is eager to learn more information about her health     [facility] and Keep All Appointments   On track    Timeframe:  Long-Range Goal Priority:  Medium Start Date:    42683419                         Expected End Date:     62229798                Follow Up Date 92119417   - call to cancel if needed - keep a calendar with prescription refill dates - keep a calendar with appointment dates    Why is this important?   Part of staying healthy is seeing the doctor for follow-up care.  If you forget your appointments, there are some things you can do to stay on track.    Notes:  [id number] Patient is keeping her appointments     (THN)Monitor and Manage My Blood Sugar   On track    Timeframe:  Long-Range Goal Priority:  High Start Date: 85631497                             Expected End Date:    02637858                  Follow Up Date 85027741  - check blood sugar at prescribed times - check blood sugar if I feel it is too high or too low - enter blood sugar readings and medication or insulin into daily log - take the blood sugar log to all doctor visits - take the blood sugar meter to all doctor visits    Why is this important?   Checking your blood sugar at home helps to keep it from getting very high or very low.  Writing the results in a diary or log helps the doctor know how to care for you.  Your blood sugar log should have the time, date and the results.  Also, write down the amount of insulin or other medicine that you take.  Other information, like what you ate, exercise done and how you were feeling, will also be helpful.     Notes:  Fasting blood sugar 158 [id number] Fasting blood sugar today was 138 [id number] FBS 147     (THN)Obtain Eye Exam   On track    Timeframe:  Long-Range Goal Priority:  Medium Start Date:    28366294  Expected End Date: 30160109              Follow Up Date 32355732 - keep appointment with eye doctor - schedule appointment with eye doctor    Why is this important?   Eye check-ups are important when you have diabetes.  Vision loss can be prevented.    Notes:  Last eye exam was done in 2021 20254270 last eye exam 07/06/2020     (THN)Set  My Target A1C   Not on track    Timeframe:  Short-Term Goal Priority:  High Start Date:   62376283                          Expected End Date:  15176160                 Follow Up Date 73710626   - set target A1C    Why is this important?   Your target A1C is decided together by you and your doctor.  It is based on several things like your age and other health issues.    Notes:  7.0 [id number] Patient A1C is 6.4. Patient has met goal A1C remains 6.4 [id number] A1C 7.2     (THNPerform Foot Care   On track    Timeframe:  Long-Range Goal Priority:  Medium Start Date:    18299371                         Expected End Date:  69678938         Follow Up Date 10175102   - check feet daily for cuts, sores or redness - keep feet up while sitting - trim toenails straight across - wash and dry feet carefully every day - wear comfortable, cotton socks - wear comfortable, well-fitting shoes    Why is this important?   Good foot care is very important when you have diabetes.  There are many things you can do to keep your feet healthy and catch a problem early.    Notes:  58527782 Patient monitors feet for sores and ulcers        Plan:  Follow-up: Follow-up in 3 month(s) RN sent Guide for your journey with diabetes RN sent educational material on how to check blood pressure RN provided a blood pressure monitor RN sent update assessment to PCP  [facility] [phone]   RN

## 2021-01-16 NOTE — Patient Instructions (Signed)
Goals Addressed             This Visit's Progress    (THN)Learn More About My Health   On track    Timeframe:  Long-Range Goal Priority:  Medium Start Date:      69629528                       Expected End Date:         41324401            Follow Up Date  02725366 - ask questions - repeat what I heard to make sure I understand - bring a list of my medicines to the visit    Why is this important?   The best way to learn about your health and care is by talking to the doctor and nurse.  They will answer your questions and give you information in the way that you like best.    Notes:  44034742 Patient is eager to learn more information about her health     [facility] and Keep All Appointments   On track    Timeframe:  Long-Range Goal Priority:  Medium Start Date:    59563875                         Expected End Date:    64332951                Follow Up Date 88416606   - call to cancel if needed - keep a calendar with prescription refill dates - keep a calendar with appointment dates    Why is this important?   Part of staying healthy is seeing the doctor for follow-up care.  If you forget your appointments, there are some things you can do to stay on track.    Notes:  30160109 Patient is keeping her appointments     (THN)Monitor and Manage My Blood Sugar   On track    Timeframe:  Long-Range Goal Priority:  High Start Date: 32355732                             Expected End Date:    20254270                  Follow Up Date 62376283  - check blood sugar at prescribed times - check blood sugar if I feel it is too high or too low - enter blood sugar readings and medication or insulin into daily log - take the blood sugar log to all doctor visits - take the blood sugar meter to all doctor visits    Why is this important?   Checking your blood sugar at home helps to keep it from getting very high or very low.  Writing the results in a diary or log helps the doctor know  how to care for you.  Your blood sugar log should have the time, date and the results.  Also, write down the amount of insulin or other medicine that you take.  Other information, like what you ate, exercise done and how you were feeling, will also be helpful.     Notes:  Fasting blood sugar 158 15176160 Fasting blood sugar today was 138 73710626 FBS 147     (THN)Obtain Eye Exam   On track    Timeframe:  Long-Range Goal Priority:  Medium Start Date:  15400867                         Expected End Date: 61950932              Follow Up Date 67124580 - keep appointment with eye doctor - schedule appointment with eye doctor    Why is this important?   Eye check-ups are important when you have diabetes.  Vision loss can be prevented.    Notes:  Last eye exam was done in 2021 99833825 last eye exam 07/06/2020     (THN)Set My Target A1C   Not on track    Timeframe:  Short-Term Goal Priority:  High Start Date:   05397673                          Expected End Date:  41937902                 Follow Up Date 40973532   - set target A1C    Why is this important?   Your target A1C is decided together by you and your doctor.  It is based on several things like your age and other health issues.    Notes:  7.0 99242683 Patient A1C is 6.4. Patient has met goal A1C remains 6.4 41962229 A1C 7.2     (THNPerform Foot Care   On track    Timeframe:  Long-Range Goal Priority:  Medium Start Date:    79892119                         Expected End Date:  41740814         Follow Up Date 48185631   - check feet daily for cuts, sores or redness - keep feet up while sitting - trim toenails straight across - wash and dry feet carefully every day - wear comfortable, cotton socks - wear comfortable, well-fitting shoes    Why is this important?   Good foot care is very important when you have diabetes.  There are many things you can do to keep your feet healthy and catch a problem early.     Notes:  49702637 Patient monitors feet for sores and ulcers

## 2021-01-17 ENCOUNTER — Other Ambulatory Visit: Payer: Self-pay | Admitting: Internal Medicine

## 2021-01-17 DIAGNOSIS — M810 Age-related osteoporosis without current pathological fracture: Secondary | ICD-10-CM

## 2021-01-18 ENCOUNTER — Other Ambulatory Visit: Payer: Self-pay | Admitting: Internal Medicine

## 2021-01-18 DIAGNOSIS — I69354 Hemiplegia and hemiparesis following cerebral infarction affecting left non-dominant side: Secondary | ICD-10-CM

## 2021-01-18 NOTE — Telephone Encounter (Signed)
Requested medication (s) are due for refill today: yes  Requested medication (s) are on the active medication list: yes  Last refill:  07/06/20 #180 1 refill  Future visit scheduled: yes in 2 months  Notes to clinic:  not delegated per protocol     Requested Prescriptions  Pending Prescriptions Disp Refills   baclofen (LIORESAL) 10 MG tablet [Pharmacy Med Name: Baclofen 10 MG Oral Tablet] 60 tablet 0    Sig: Take 1 tablet by mouth twice daily     Not Delegated - Analgesics:  Muscle Relaxants Failed - 01/18/2021  3:57 PM      Failed - This refill cannot be delegated      Passed - Valid encounter within last 6 months    Recent Outpatient Visits           1 month ago Annual physical exam   [facility] Reubin Milan, MD   1 month ago Pruritic disorder   [facility] Reubin Milan, MD   6 months ago Type II diabetes mellitus with complication [facility])   [facility] Reubin Milan, MD   6 months ago Impacted cerumen of right ear   [facility] Reubin Milan, MD   8 months ago Tinea corporis   [facility] Reubin Milan, MD       Future Appointments             In 2 months Judithann Blatt Nyoka Cowden, MD [facility]   In 10 months Judithann Wetmore Nyoka Cowden, MD [facility]

## 2021-01-18 NOTE — Telephone Encounter (Signed)
Requested medications are due for refill today.  yes  Requested medications are on the active medications list.  yes  Last refill. 03/02/2020  Future visit scheduled.   yes  Notes to clinic.  Over due for Vitamin D level labs.

## 2021-01-24 ENCOUNTER — Ambulatory Visit: Payer: Self-pay

## 2021-01-24 ENCOUNTER — Other Ambulatory Visit: Payer: Self-pay | Admitting: *Deleted

## 2021-01-24 ENCOUNTER — Other Ambulatory Visit: Payer: Self-pay

## 2021-01-24 DIAGNOSIS — E785 Hyperlipidemia, unspecified: Secondary | ICD-10-CM | POA: Diagnosis not present

## 2021-01-24 DIAGNOSIS — R9431 Abnormal electrocardiogram [ECG] [EKG]: Secondary | ICD-10-CM | POA: Diagnosis not present

## 2021-01-24 DIAGNOSIS — E119 Type 2 diabetes mellitus without complications: Secondary | ICD-10-CM | POA: Diagnosis not present

## 2021-01-24 DIAGNOSIS — N3000 Acute cystitis without hematuria: Secondary | ICD-10-CM | POA: Diagnosis not present

## 2021-01-24 DIAGNOSIS — Z8673 Personal history of transient ischemic attack (TIA), and cerebral infarction without residual deficits: Secondary | ICD-10-CM | POA: Diagnosis not present

## 2021-01-24 DIAGNOSIS — I10 Essential (primary) hypertension: Secondary | ICD-10-CM | POA: Diagnosis not present

## 2021-01-24 DIAGNOSIS — I440 Atrioventricular block, first degree: Secondary | ICD-10-CM | POA: Diagnosis not present

## 2021-01-24 NOTE — Telephone Encounter (Signed)
Patient called and says her BP is 149/115 checked about 15 minutes before the call. She says she had a headache but not at this time. She says she took her medication at 8 am this morning, no missed doses. She denies any symptoms. I asked her to recheck her BP, she says it is now 182/130, P 77. I advised to go to the ED, her family member is there and will take her to the ED, care advice given.   Reason for Disposition  [1] Systolic BP  >= 160 OR Diastolic >= 100 AND [2] cardiac or neurologic symptoms (e.g., chest pain, difficulty breathing, unsteady gait, blurred vision)  Answer Assessment - Initial Assessment Questions 1. BLOOD PRESSURE: "What is the blood pressure?" "Did you take at least two measurements 5 minutes apart?"     149/115 2. ONSET: "When did you take your blood pressure?"     15 minutes ago 3. HOW: "How did you obtain the blood pressure?" (e.g., visiting nurse, automatic home BP monitor)     Automatic home BP monitor 4. HISTORY: "Do you have a history of high blood pressure?"     Yes 5. MEDICATIONS: "Are you taking any medications for blood pressure?" "Have you missed any doses recently?"     Yes, no missed doses, took at 8am  6. OTHER SYMPTOMS: "Do you have any symptoms?" (e.g., headache, chest pain, blurred vision, difficulty breathing, weakness)     Headache that is now gone, no other symptoms 7. PREGNANCY: "Is there any chance you are pregnant?" "When was your last menstrual period?"     No  Protocols used: Blood Pressure - High-A-AH

## 2021-01-24 NOTE — Patient Outreach (Signed)
[facility]) Care Management  01/24/2021  Sharon York December 31, 1945 [id number]   RN Health Coach telephone call to patient.  Hipaa compliance verified. Per patient she had received her new blood pressure cuff. Her blood pressure yesterday was 149/92. Her blood pressure today was 149/115. Patient is on lisinopril. Heart rate.  Plan: Patient will monitor daily and document Patient will notify Dr of elevated readings   [name] BSN RN [facility] Care Management [phone]

## 2021-01-25 NOTE — Telephone Encounter (Signed)
Noted  ° °Pt informed to go to ED. ° °KP °

## 2021-01-27 ENCOUNTER — Other Ambulatory Visit: Payer: Self-pay | Admitting: *Deleted

## 2021-01-27 NOTE — Patient Outreach (Signed)
[facility]) Care Management  01/27/2021  JAYLIANA VALENCIA [birth year] [id number]   RN Health Coach telephone call to patient.  Hipaa compliance verified. Per patient she was calling in to give her blood pressure reading and blood glucose readings.  BP ranges 149/115, 198/108,189/93,160/84,108/93, and 163/90. Her fasting blood sugars are ranging from 121-162. Today blood sugar is 140. Patient stated she called the Dr office regarding the elevated blood pressure and directed to go to the hospital. It was noted that she had a UTI. Patient is currently taking antibiotics.   Plan: Patient will document blood pressures daily and take to PCP. Patient will take her BP monitor to Dr office to make sure she is checking BP correctly. Patient will continue to check blood sugars Patient will  eat dinner 6 or before. Patient has made appointment for [phone] with PCP  [name] BSN RN [facility] Care Management [phone]

## 2021-01-31 ENCOUNTER — Ambulatory Visit: Payer: Medicare Other | Admitting: Internal Medicine

## 2021-02-02 ENCOUNTER — Ambulatory Visit (INDEPENDENT_AMBULATORY_CARE_PROVIDER_SITE_OTHER): Payer: Medicare Other | Admitting: Internal Medicine

## 2021-02-02 ENCOUNTER — Other Ambulatory Visit: Payer: Self-pay

## 2021-02-02 ENCOUNTER — Encounter: Payer: Self-pay | Admitting: Internal Medicine

## 2021-02-02 VITALS — BP 130/82 | HR 75 | Temp 98.0°F | Ht <= 58 in | Wt 159.0 lb

## 2021-02-02 DIAGNOSIS — R030 Elevated blood-pressure reading, without diagnosis of hypertension: Secondary | ICD-10-CM | POA: Diagnosis not present

## 2021-02-02 DIAGNOSIS — Z23 Encounter for immunization: Secondary | ICD-10-CM | POA: Diagnosis not present

## 2021-02-02 DIAGNOSIS — E1169 Type 2 diabetes mellitus with other specified complication: Secondary | ICD-10-CM | POA: Diagnosis not present

## 2021-02-02 DIAGNOSIS — E785 Hyperlipidemia, unspecified: Secondary | ICD-10-CM

## 2021-02-02 DIAGNOSIS — E118 Type 2 diabetes mellitus with unspecified complications: Secondary | ICD-10-CM

## 2021-02-02 MED ORDER — ATORVASTATIN CALCIUM 10 MG PO TABS: 10.0000 mg | ORAL_TABLET | Freq: Every day | ORAL | 3 refills | Status: DC

## 2021-02-02 NOTE — Progress Notes (Signed)
Date:  02/02/2021   Name:  Sharon York   DOB:  April 19, 1946   MRN:  [id number]   Chief Complaint: Hypertension (Having high BP at home 170s), Hyperglycemia (140s to 170s ), and Flu Vaccine  Hypertension This is a new problem. 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 Pertinent negatives include no anxiety, chest pain, headaches, palpitations, peripheral edema or shortness of breath. There are no associated agents to hypertension. Past treatments include nothing.   Lab Results  Component Value Date   CREATININE 0.68 12/07/2020   BUN 9 12/07/2020   NA 140 12/07/2020   K 4.7 12/07/2020   CL 102 12/07/2020   CO2 27 12/07/2020   Lab Results  Component Value Date   CHOL 172 12/07/2020   HDL 71 12/07/2020   LDLCALC 86 12/07/2020   TRIG 81 12/07/2020   CHOLHDL 2.4 12/07/2020   Lab Results  Component Value Date   TSH 3.620 12/07/2020   Lab Results  Component Value Date   HGBA1C 7.2 (H) 12/07/2020   Lab Results  Component Value Date   WBC 6.0 12/07/2020   HGB 12.4 12/07/2020   HCT 39.2 12/07/2020   MCV 73 (L) 12/07/2020   PLT 300 12/07/2020   Lab Results  Component Value Date   ALT 15 12/07/2020   AST 21 12/07/2020   ALKPHOS 52 12/07/2020   BILITOT 0.3 12/07/2020     Review of Systems  Constitutional:  Negative for chills, fatigue and fever.  Respiratory:  Negative for chest tightness and shortness of breath.   Cardiovascular:  Negative for chest pain and palpitations.  Neurological:  Negative for dizziness and headaches.   Patient Active Problem List   Diagnosis Date Noted   Microalbuminuria due to type 2 diabetes mellitus (HCC) 12/07/2019   Type II diabetes mellitus with complication (HCC) 04/21/2018   Right shoulder pain 03/31/2018   Hyperlipidemia associated with type 2 diabetes mellitus (HCC) 06/12/2017   Tinea corporis 03/17/2015   Abnormal gait 12/15/2014   Altered bowel  function 12/15/2014   Hemiparesis affecting left side as late effect of cerebrovascular accident (CVA) (HCC) 12/15/2014   Obstructive sleep apnea of adult 12/15/2014   Arthritis of shoulder region, degenerative 12/15/2014   OP (osteoporosis) 12/15/2014   Allergic rhinitis, seasonal 12/15/2014   Mixed incontinence 03/11/2013    Allergies  Allergen Reactions   Nitrofurantoin Nausea Only   Iodinated Diagnostic Agents Hives and Cough    Patient developed a hive on her left lip after injection as well as some coughing.     Past Surgical History:  Procedure Laterality Date   CESAREAN SECTION  1975   COLONOSCOPY  2010   normal    Social History   Tobacco Use   Smoking status: Never   Smokeless tobacco: Never   Tobacco comments:    smoking cessation materials not required  Vaping Use   Vaping Use: Never used  Substance Use Topics   Alcohol use: No    Alcohol/week: 0.0 standard drinks   Drug use: Never     Medication list has been reviewed and updated.  Current Meds  Medication Sig   ACCU-CHEK AVIVA PLUS test strip 1 each by Other route 4 (four) times daily.   Accu-Chek Softclix Lancets lancets USE TO CHECK GLUCOSE UP TO 4 TIMES DAILY   alendronate (FOSAMAX) 70 MG tablet TAKE 1 TABLET BY MOUTH ONCE A WEEK.  TAKE WITH A FULL GLASS OF WATER ON AN EMPTY STOMACH.   aspirin 81 MG chewable tablet Chew 1 tablet by mouth daily.   atorvastatin (LIPITOR) 10 MG tablet Take 1 tablet by mouth once daily   baclofen (LIORESAL) 10 MG tablet Take 1 tablet by mouth twice daily   Calcium Carbonate-Vitamin D 500-125 MG-UNIT TABS Take 1 tablet by mouth daily.    lisinopril (ZESTRIL) 2.5 MG tablet Take 1 tablet (2.5 mg total) by mouth daily.   metFORMIN (GLUCOPHAGE) 500 MG tablet Take 1 tablet (500 mg total) by mouth daily with breakfast.   Multiple Vitamins-Minerals (MULTIVITAMIN ADULT PO) Take by mouth.   nystatin cream (MYCOSTATIN) Apply 1 application topically 2 (two) times daily. To skin  under breast    PHQ 2/9 Scores 02/02/2021 12/07/2020 12/01/2020 07/11/2020  PHQ - 2 Score 0 0 0 0  PHQ- 9 Score 3 0 3 -    GAD 7 : Generalized Anxiety Score 02/02/2021 12/07/2020 12/01/2020 07/06/2020  Nervous, Anxious, on Edge 0 0 0 0  Control/stop worrying 0 0 1 0  Worry too much - different things 0 0 1 0  Trouble relaxing 0 0 0 0  Restless 0 0 0 0  Easily annoyed or irritable 0 0 0 0  Afraid - awful might happen 0 0 0 0  Total GAD 7 Score 0 0 2 0  Anxiety Difficulty - Not difficult at all - Not difficult at all    BP Readings from Last 3 Encounters:  02/02/21 130/82  12/07/20 125/82  12/01/20 106/74    Physical Exam Vitals and nursing note reviewed.  Constitutional:      General: She is not in acute distress.    Appearance: She is well-developed.  HENT:     Head: Normocephalic and atraumatic.  Cardiovascular:     Rate and Rhythm: Normal rate and regular rhythm.  Pulmonary:     Effort: Pulmonary effort is normal. No respiratory distress.  Musculoskeletal:     Cervical back: Normal range of motion.  Skin:    General: Skin is warm and dry.     Findings: No rash.  Neurological:     Mental Status: She is alert and oriented to person, place, and time. Mental status is at baseline.     Comments: Left hemiparesis  Psychiatric:        Mood and Affect: Mood normal.        Behavior: Behavior normal.    Wt Readings from Last 3 Encounters:  02/02/21 159 lb (72.1 kg)  12/07/20 165 lb (74.8 kg)  12/01/20 165 lb (74.8 kg)    BP 130/82   Pulse 75   Temp 98 F (36.7 C) (Oral)   Ht 4\' 10"  (1.473 m)   Wt 159 lb (72.1 kg)   SpO2 96%   BMI 33.23 kg/m   Assessment and Plan: 1. Type II diabetes mellitus with complication (HCC) Clinically stable by exam and report without s/s of hypoglycemia. DM complicated by hypertension and dyslipidemia. Tolerating medications well without side effects or other concerns.  2. Elevated blood pressure, situational Normal readings today  with her wrist cuff on her right wrist and our arm cuff on her right arm Pt to check at home weekly on the right wrist only.  3. Hyperlipidemia associated with type 2 diabetes mellitus (HCC) Tolerating statin medication without side effects at this time LDL is at goal of < 70 on current dose Continue same therapy without change at this time. -  atorvastatin (LIPITOR) 10 MG tablet; Take 1 tablet (10 mg total) by mouth daily.  Dispense: 90 tablet; Refill: 3   Partially dictated using Animal nutritionist. Any errors are unintentional.  Bari Edward, MD [facility]  02/02/2021

## 2021-02-06 ENCOUNTER — Ambulatory Visit (INDEPENDENT_AMBULATORY_CARE_PROVIDER_SITE_OTHER): Payer: Medicare Other | Admitting: Urology

## 2021-02-06 ENCOUNTER — Other Ambulatory Visit: Payer: Self-pay

## 2021-02-06 DIAGNOSIS — R350 Frequency of micturition: Secondary | ICD-10-CM

## 2021-02-06 NOTE — Progress Notes (Signed)
Patient ID: Sharon York, female   DOB: 20-Sep-1945, 75 y.o.   MRN: [id number] PTNS  Session # 19 of 45  Health & Social Factors: no change Caffeine: 0 Alcohol: 0 Daytime voids #per day: 4-5 Night-time voids #per night: 1 Urgency: mild Incontinence Episodes #per day: 0 Ankle used: right Treatment Setting: 6 Feeling/ Response: sensory Comments: pt tolerated well  Performed By: Mervin Hack, CMA   Follow Up: for 20 of 45

## 2021-02-24 ENCOUNTER — Telehealth: Payer: Self-pay

## 2021-02-24 NOTE — Telephone Encounter (Signed)
Copied from CRM [id number]. Topic: Quick Communication - Rx Refill/Question >> Feb 24, 2021  4:32 PM Pawlus, Maxine Glenn A wrote: Pt wanted to know if Dr Judithann Westrup could send in a new blood sugar monitor to the [id number] - [place], [place] - [address] [address], pts uses the accu chek aviva.

## 2021-02-27 ENCOUNTER — Other Ambulatory Visit: Payer: Self-pay

## 2021-02-27 MED ORDER — BLOOD GLUCOSE METER KIT: PACK | 0 refills | Status: DC

## 2021-02-27 NOTE — Telephone Encounter (Signed)
Sent new machine to [facility].

## 2021-03-01 DIAGNOSIS — H25813 Combined forms of age-related cataract, bilateral: Secondary | ICD-10-CM | POA: Diagnosis not present

## 2021-03-01 DIAGNOSIS — E119 Type 2 diabetes mellitus without complications: Secondary | ICD-10-CM | POA: Diagnosis not present

## 2021-03-01 LAB — HM DIABETES EYE EXAM

## 2021-03-06 ENCOUNTER — Encounter: Payer: Self-pay | Admitting: Internal Medicine

## 2021-03-13 ENCOUNTER — Other Ambulatory Visit: Payer: Self-pay

## 2021-03-13 ENCOUNTER — Ambulatory Visit: Payer: Medicare Other | Admitting: Urology

## 2021-03-13 ENCOUNTER — Ambulatory Visit (INDEPENDENT_AMBULATORY_CARE_PROVIDER_SITE_OTHER): Payer: Medicare Other | Admitting: Urology

## 2021-03-13 DIAGNOSIS — R350 Frequency of micturition: Secondary | ICD-10-CM | POA: Diagnosis not present

## 2021-03-13 NOTE — Progress Notes (Signed)
Patient ID: CIERRA ROTHGEB, female   DOB: 17-Jul-1945, 75 y.o.   MRN: [id number] PTNS  Session # 20 of 45  Health & Social Factors: no change Caffeine: 0 Alcohol: 0 Daytime voids #per day: 4-5 Night-time voids #per night: 1 Urgency: mild Incontinence Episodes #per day: 0 Ankle used: right Treatment Setting: 3 Feeling/ Response: sensory Comments: pt tolerated well  Performed By: Mervin Hack, CMA  Follow Up: for #21 of 45

## 2021-03-14 ENCOUNTER — Ambulatory Visit
Admission: RE | Admit: 2021-03-14 | Discharge: 2021-03-14 | Disposition: A | Discharge status: Home / Self Care | Payer: Medicare Other | Source: Ambulatory Visit | Attending: Internal Medicine | Admitting: Internal Medicine

## 2021-03-14 DIAGNOSIS — Z1231 Encounter for screening mammogram for malignant neoplasm of breast: Secondary | ICD-10-CM | POA: Diagnosis not present

## 2021-03-22 ENCOUNTER — Other Ambulatory Visit: Payer: Self-pay | Admitting: Internal Medicine

## 2021-03-22 NOTE — Telephone Encounter (Signed)
Rx written 11/16/2020 for #100 with 12 refills. Pt should have an ample supply.

## 2021-03-23 NOTE — Telephone Encounter (Addendum)
[facility] called and spoke to [place], Pensions consultant about the refill(s) Accuchek Guide In Vitro Strips requested. Advised Accu-check Aviva Plus strips were sent in June and are they the same. She says no that that meter is being discontinued, so the patient will need  a new Rx for the Accu-Check Guide strips as requested.

## 2021-03-23 NOTE — Telephone Encounter (Signed)
Disregard my entry for this refill for the ACCU-CHEK AVIVA test strips.   2 nurse were working on it at the same time.

## 2021-03-23 NOTE — Telephone Encounter (Signed)
Requested Prescriptions  Pending Prescriptions Disp Refills  . ACCU-CHEK GUIDE test strip [Pharmacy Med Name: Accu-Chek Guide In Vitro Strip] 100 each 0    Sig: USE  4 TIMES DAILY AS DIRECTED     Endocrinology: Diabetes - Testing Supplies Passed - 03/22/2021  6:10 PM      Passed - Valid encounter within last 12 months    Recent Outpatient Visits          1 month ago Type II diabetes mellitus with complication [facility])   [facility] Reubin Milan, MD   3 months ago Annual physical exam   [facility] Reubin Milan, MD   3 months ago Pruritic disorder   [facility] Reubin Milan, MD   8 months ago Type II diabetes mellitus with complication [facility])   [facility] Reubin Milan, MD   8 months ago Impacted cerumen of right ear   [facility] Reubin Milan, MD      Future Appointments            In 2 weeks McGowan, Elana Alm  [facility] [place]   In 3 weeks Reubin Milan, MD [facility]   In 8 months Judithann Rupard Nyoka Cowden, MD [facility]

## 2021-03-24 ENCOUNTER — Telehealth: Payer: Self-pay | Admitting: Internal Medicine

## 2021-03-25 NOTE — Telephone Encounter (Signed)
This particular strip was ordered 03/25/21 by Dr. Judithann Lickteig

## 2021-03-25 NOTE — Telephone Encounter (Signed)
Second request for new order

## 2021-03-27 ENCOUNTER — Other Ambulatory Visit: Payer: Self-pay

## 2021-03-27 ENCOUNTER — Other Ambulatory Visit: Payer: Self-pay | Admitting: Internal Medicine

## 2021-03-27 MED ORDER — ACCU-CHEK AVIVA PLUS VI STRP: 1.0000 | ORAL_STRIP | Freq: Four times a day (QID) | 12 refills | Status: DC

## 2021-03-27 NOTE — Telephone Encounter (Signed)
Patient called in to inform Dr Judithann Schwake that Rx test strips were not received by the pharmacy patient want Dr Judithann Zettlemoyer to also know she had not checked her blood sugar all day yesterday and not able to check it to asking for her refill today please. Ph#  [phone]

## 2021-03-27 NOTE — Telephone Encounter (Signed)
Unsure why this was refused. Sent in test strips.

## 2021-03-28 ENCOUNTER — Other Ambulatory Visit: Payer: Self-pay | Admitting: Internal Medicine

## 2021-03-28 ENCOUNTER — Other Ambulatory Visit: Payer: Self-pay

## 2021-03-28 MED ORDER — ACCU-CHEK GUIDE VI STRP: ORAL_STRIP | 12 refills | Status: DC

## 2021-03-28 NOTE — Telephone Encounter (Signed)
Pt called in for assistance. Pt says that the test strips that were sent in doesn't fit her meter. Pt says that she has the ACCU-CHEK  guide meter. Pt would like to know if the correct strips  could be sent in to her pharmacy?

## 2021-03-29 NOTE — Telephone Encounter (Signed)
These are the correct strips and was ordered yesterday and picked up by the patient per pharmacy. This is a duplicate and not needed-refused

## 2021-04-07 ENCOUNTER — Other Ambulatory Visit: Payer: Self-pay

## 2021-04-07 ENCOUNTER — Ambulatory Visit (INDEPENDENT_AMBULATORY_CARE_PROVIDER_SITE_OTHER): Payer: Medicare Other | Admitting: Internal Medicine

## 2021-04-07 ENCOUNTER — Encounter: Payer: Self-pay | Admitting: Internal Medicine

## 2021-04-07 VITALS — BP 122/64 | HR 78 | Ht <= 58 in | Wt 152.4 lb

## 2021-04-07 DIAGNOSIS — E118 Type 2 diabetes mellitus with unspecified complications: Secondary | ICD-10-CM

## 2021-04-07 DIAGNOSIS — N3000 Acute cystitis without hematuria: Secondary | ICD-10-CM

## 2021-04-07 LAB — POCT URINALYSIS DIPSTICK
Bilirubin, UA: NEGATIVE
Blood, UA: NEGATIVE
Glucose, UA: NEGATIVE
Ketones, UA: NEGATIVE
Nitrite, UA: POSITIVE
Protein, UA: NEGATIVE
Spec Grav, UA: <=1.005 — AB (ref 1.010–1.025)
Urobilinogen, UA: 0.2 E.U./dL
pH, UA: 5.5 (ref 5.0–8.0)

## 2021-04-07 MED ORDER — SULFAMETHOXAZOLE-TRIMETHOPRIM 800-160 MG PO TABS: 1.0000 | ORAL_TABLET | Freq: Two times a day (BID) | ORAL | 0 refills | Status: AC

## 2021-04-07 MED ORDER — ACCU-CHEK SOFTCLIX LANCETS MISC: 4 refills | Status: DC

## 2021-04-07 NOTE — Progress Notes (Signed)
Date:  04/07/2021   Name:  Sharon York   DOB:  1946/05/08   MRN:  [id number]   Chief Complaint: Urinary Tract Infection  Urinary Tract Infection  This is a new problem. The current episode started in the past 7 days. The quality of the pain is described as aching and burning. The pain is mild. There has been no fever. She is Not sexually active. There is No history of pyelonephritis. Associated symptoms include frequency, hesitancy and urgency. Pertinent negatives include no chills, nausea or vomiting.   Lab Results  Component Value Date   CREATININE 0.68 12/07/2020   BUN 9 12/07/2020   NA 140 12/07/2020   K 4.7 12/07/2020   CL 102 12/07/2020   CO2 27 12/07/2020   Lab Results  Component Value Date   CHOL 172 12/07/2020   HDL 71 12/07/2020   LDLCALC 86 12/07/2020   TRIG 81 12/07/2020   CHOLHDL 2.4 12/07/2020   Lab Results  Component Value Date   TSH 3.620 12/07/2020   Lab Results  Component Value Date   HGBA1C 7.2 (H) 12/07/2020   Lab Results  Component Value Date   WBC 6.0 12/07/2020   HGB 12.4 12/07/2020   HCT 39.2 12/07/2020   MCV 73 (L) 12/07/2020   PLT 300 12/07/2020   Lab Results  Component Value Date   ALT 15 12/07/2020   AST 21 12/07/2020   ALKPHOS 52 12/07/2020   BILITOT 0.3 12/07/2020   No components found for: VITD  Review of Systems  Constitutional:  Negative for chills, fatigue and fever.  Respiratory:  Negative for chest tightness and shortness of breath.   Gastrointestinal:  Negative for abdominal distention, abdominal pain, diarrhea, nausea and vomiting.  Genitourinary:  Positive for frequency, hesitancy and urgency.  Neurological:  Negative for dizziness and headaches.  Psychiatric/Behavioral:  Negative for dysphoric mood and sleep disturbance. The patient is not nervous/anxious.    Patient Active Problem List   Diagnosis Date Noted   Microalbuminuria due to type 2 diabetes mellitus ([place]) 12/07/2019   Type II diabetes mellitus  with complication ([place]) 79/39/0300   Right shoulder pain 03/31/2018   Hyperlipidemia associated with type 2 diabetes mellitus ([place]) 06/12/2017   Tinea corporis 03/17/2015   Abnormal gait 12/15/2014   Altered bowel function 12/15/2014   Hemiparesis affecting left side as late effect of cerebrovascular accident (CVA) ([place]) 12/15/2014   Obstructive sleep apnea of adult 12/15/2014   Arthritis of shoulder region, degenerative 12/15/2014   OP (osteoporosis) 12/15/2014   Allergic rhinitis, seasonal 12/15/2014   Mixed incontinence 03/11/2013    Allergies  Allergen Reactions   Nitrofurantoin Nausea Only   Iodinated Diagnostic Agents Hives and Cough    Patient developed a hive on her left lip after injection as well as some coughing.     Past Surgical History:  Procedure Laterality Date   [place]   COLONOSCOPY  2010   normal    Social History   Tobacco Use   Smoking status: Never   Smokeless tobacco: Never   Tobacco comments:    smoking cessation materials not required  Vaping Use   Vaping Use: Never used  Substance Use Topics   Alcohol use: No    Alcohol/week: 0.0 standard drinks   Drug use: Never     Medication list has been reviewed and updated.  Current Meds  Medication Sig   ACCU-CHEK AVIVA PLUS test strip 1 each by Other route 4 (four)  times daily.   Accu-Chek Softclix Lancets lancets USE TO CHECK GLUCOSE UP TO 4 TIMES DAILY   alendronate (FOSAMAX) 70 MG tablet TAKE 1 TABLET BY MOUTH ONCE A WEEK. TAKE WITH A FULL GLASS OF WATER ON AN EMPTY STOMACH.   aspirin 81 MG chewable tablet Chew 1 tablet by mouth daily.   atorvastatin (LIPITOR) 10 MG tablet Take 1 tablet (10 mg total) by mouth daily.   baclofen (LIORESAL) 10 MG tablet Take 1 tablet by mouth twice daily   blood glucose meter kit and supplies Dispense based on patient and insurance preference. Use up to four times daily as directed. (FOR ICD-10 E10.9, E11.9).   Calcium Carbonate-Vitamin D 500-125  MG-UNIT TABS Take 1 tablet by mouth daily.    glucose blood (ACCU-CHEK GUIDE) test strip Use as instructed   lisinopril (ZESTRIL) 2.5 MG tablet Take 1 tablet (2.5 mg total) by mouth daily.   metFORMIN (GLUCOPHAGE) 500 MG tablet Take 1 tablet (500 mg total) by mouth daily with breakfast.   Multiple Vitamins-Minerals (MULTIVITAMIN ADULT PO) Take by mouth.   nystatin cream (MYCOSTATIN) Apply 1 application topically 2 (two) times daily. To skin under breast    PHQ 2/9 Scores 04/07/2021 02/02/2021 12/07/2020 12/01/2020  PHQ - 2 Score 0 0 0 0  PHQ- 9 Score 1 3 0 3    GAD 7 : Generalized Anxiety Score 04/07/2021 02/02/2021 12/07/2020 12/01/2020  Nervous, Anxious, on Edge 0 0 0 0  Control/stop worrying 0 0 0 1  Worry too much - different things 0 0 0 1  Trouble relaxing 0 0 0 0  Restless 0 0 0 0  Easily annoyed or irritable 0 0 0 0  Afraid - awful might happen 0 0 0 0  Total GAD 7 Score 0 0 0 2  Anxiety Difficulty Not difficult at all - Not difficult at all -    BP Readings from Last 3 Encounters:  04/07/21 122/64  02/02/21 130/82  12/07/20 125/82    Physical Exam Vitals and nursing note reviewed.  Constitutional:      Appearance: She is well-developed.  Cardiovascular:     Rate and Rhythm: Normal rate and regular rhythm.     Heart sounds: Normal heart sounds.  Pulmonary:     Effort: Pulmonary effort is normal. No respiratory distress.     Breath sounds: Normal breath sounds.  Abdominal:     General: Bowel sounds are normal.     Palpations: Abdomen is soft.     Tenderness: There is no abdominal tenderness. There is no right CVA tenderness, left CVA tenderness, guarding or rebound.    Wt Readings from Last 3 Encounters:  04/07/21 152 lb 6.4 oz (69.1 kg)  02/02/21 159 lb (72.1 kg)  12/07/20 165 lb (74.8 kg)    BP 122/64   Pulse 78   Ht 4' 10" (1.473 m)   Wt 152 lb 6.4 oz (69.1 kg)   SpO2 97%   BMI 31.85 kg/m   Assessment and Plan: 1. Acute cystitis without  hematuria Continue to push fluids; will treast with Bactrim Follow up if no improvement - sulfamethoxazole-trimethoprim (BACTRIM DS) 800-160 MG tablet; Take 1 tablet by mouth 2 (two) times daily for 7 days.  Dispense: 14 tablet; Refill: 0 - POCT urinalysis dipstick  2. Type II diabetes mellitus with complication (HCC) - Accu-Chek Softclix Lancets lancets; USE TO CHECK GLUCOSE UP TO 4 TIMES DAILY  Dispense: 100 each; Refill: 4   Partially dictated using Editor, commissioning.  Any errors are unintentional.  Halina Maidens, MD [facility]  04/07/2021

## 2021-04-10 ENCOUNTER — Ambulatory Visit: Payer: Medicare Other | Admitting: Urology

## 2021-04-17 ENCOUNTER — Ambulatory Visit (INDEPENDENT_AMBULATORY_CARE_PROVIDER_SITE_OTHER): Payer: Medicare Other | Admitting: Internal Medicine

## 2021-04-17 ENCOUNTER — Other Ambulatory Visit: Payer: Self-pay

## 2021-04-17 ENCOUNTER — Ambulatory Visit (INDEPENDENT_AMBULATORY_CARE_PROVIDER_SITE_OTHER): Payer: Medicare Other | Admitting: Urology

## 2021-04-17 ENCOUNTER — Encounter: Payer: Self-pay | Admitting: Internal Medicine

## 2021-04-17 VITALS — BP 132/82 | HR 71 | Ht <= 58 in | Wt 156.0 lb

## 2021-04-17 DIAGNOSIS — E118 Type 2 diabetes mellitus with unspecified complications: Secondary | ICD-10-CM | POA: Diagnosis not present

## 2021-04-17 DIAGNOSIS — E785 Hyperlipidemia, unspecified: Secondary | ICD-10-CM | POA: Diagnosis not present

## 2021-04-17 DIAGNOSIS — R350 Frequency of micturition: Secondary | ICD-10-CM

## 2021-04-17 DIAGNOSIS — E1169 Type 2 diabetes mellitus with other specified complication: Secondary | ICD-10-CM

## 2021-04-17 DIAGNOSIS — N3000 Acute cystitis without hematuria: Secondary | ICD-10-CM | POA: Diagnosis not present

## 2021-04-17 LAB — POCT GLYCOSYLATED HEMOGLOBIN (HGB A1C): Hemoglobin A1C: 6.2 % — AB (ref 4.0–5.6)

## 2021-04-17 NOTE — Progress Notes (Signed)
Patient ID: Sharon York, female   DOB: [birth year], 75 y.o.   MRN: [id number] PTNS  Session # 21 of 45  Health & Social Factors: no change Caffeine: 0 Alcohol: 0 Daytime voids #per day: 4-5 Night-time voids #per night: 1 Urgency: mild Incontinence Episodes #per day: 0 Ankle used: right Treatment Setting: 3 Feeling/ Response: both Comments: pt tolerated well  Performed By: Mervin Hack, CMA  Follow Up: for #22 of 45

## 2021-04-17 NOTE — Progress Notes (Signed)
Date:  04/17/2021   Name:  Sharon York   DOB:  [birth year]   MRN:  [id number]   Chief Complaint: Diabetes  Diabetes She presents for her follow-up diabetic visit. She has type 2 diabetes mellitus. Her disease course has been stable. Pertinent negatives for hypoglycemia include no headaches or tremors. Pertinent negatives for diabetes include no chest pain, no fatigue, no foot paresthesias, no polydipsia, no polyuria and no visual change. Symptoms are stable. There is no change in her home blood glucose trend. Her breakfast blood glucose is taken between 6-7 am. Her breakfast blood glucose range is generally 110-130 mg/dl. Her overall blood glucose range is 110-130 mg/dl. An ACE inhibitor/angiotensin II receptor blocker is being taken. Eye exam is current.   Lab Results  Component Value Date   NA 140 12/07/2020   K 4.7 12/07/2020   CO2 27 12/07/2020   GLUCOSE 102 (H) 12/07/2020   BUN 9 12/07/2020   CREATININE 0.68 12/07/2020   CALCIUM 9.9 12/07/2020   EGFR 90 12/07/2020   GFRNONAA 79 12/03/2019   Lab Results  Component Value Date   CHOL 172 12/07/2020   HDL 71 12/07/2020   LDLCALC 86 12/07/2020   TRIG 81 12/07/2020   CHOLHDL 2.4 12/07/2020   Lab Results  Component Value Date   TSH 3.620 12/07/2020   Lab Results  Component Value Date   HGBA1C 6.2 (A) 04/17/2021   Lab Results  Component Value Date   WBC 6.0 12/07/2020   HGB 12.4 12/07/2020   HCT 39.2 12/07/2020   MCV 73 (L) 12/07/2020   PLT 300 12/07/2020   Lab Results  Component Value Date   ALT 15 12/07/2020   AST 21 12/07/2020   ALKPHOS 52 12/07/2020   BILITOT 0.3 12/07/2020   No components found for: VITD  Review of Systems  Constitutional:  Negative for appetite change, fatigue, fever and unexpected weight change.  HENT:  Negative for tinnitus and trouble swallowing.   Eyes:  Negative for visual disturbance.  Respiratory:  Negative for cough, chest tightness and shortness of breath.    Cardiovascular:  Negative for chest pain, palpitations and leg swelling.  Gastrointestinal:  Negative for abdominal pain.  Endocrine: Negative for polydipsia and polyuria.  Genitourinary:  Negative for dysuria and hematuria.  Musculoskeletal:  Negative for arthralgias.  Neurological:  Negative for tremors, numbness and headaches.  Psychiatric/Behavioral:  Negative for dysphoric mood.    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     Past Surgical History:  Procedure Laterality Date   [place]   COLONOSCOPY  2010   normal    Social History   Tobacco  Use   Smoking status: Never   Smokeless tobacco: Never   Tobacco comments:    smoking cessation materials not required  Vaping Use   Vaping Use: Never used  Substance Use Topics   Alcohol use: No    Alcohol/week: 0.0 standard drinks   Drug use: Never     Medication list has been reviewed and updated.  Current Meds  Medication Sig   ACCU-CHEK AVIVA PLUS test strip 1 each by Other route 4 (four) times daily.   Accu-Chek Softclix Lancets lancets USE TO CHECK GLUCOSE UP TO 4  TIMES DAILY   alendronate (FOSAMAX) 70 MG tablet TAKE 1 TABLET BY MOUTH ONCE A WEEK. TAKE WITH A FULL GLASS OF WATER ON AN EMPTY STOMACH.   aspirin 81 MG chewable tablet Chew 1 tablet by mouth daily.   atorvastatin (LIPITOR) 10 MG tablet Take 1 tablet (10 mg total) by mouth daily.   baclofen (LIORESAL) 10 MG tablet Take 1 tablet by mouth twice daily   blood glucose meter kit and supplies Dispense based on patient and insurance preference. Use up to four times daily as directed. (FOR ICD-10 E10.9, E11.9).   Calcium Carbonate-Vitamin D 500-125 MG-UNIT TABS Take 1 tablet by mouth daily.    glucose blood (ACCU-CHEK GUIDE) test strip Use as instructed   lisinopril (ZESTRIL) 2.5 MG tablet Take 1 tablet (2.5 mg total) by mouth daily.   metFORMIN (GLUCOPHAGE) 500 MG tablet Take 1 tablet (500 mg total) by mouth daily with breakfast.   Multiple Vitamins-Minerals (MULTIVITAMIN ADULT PO) Take by mouth.   nystatin cream (MYCOSTATIN) Apply 1 application topically 2 (two) times daily. To skin under breast    PHQ 2/9 Scores 04/17/2021 04/07/2021 02/02/2021 12/07/2020  PHQ - 2 Score 0 0 0 0  PHQ- 9 Score _0 0    GAD 7 : Generalized Anxiety Score 04/17/2021 04/07/2021 02/02/2021 12/07/2020  Nervous, Anxious, on Edge 0 0 0 0  Control/stop worrying 0 0 0 0  Worry too much - different things 0 0 0 0  Trouble relaxing 0 0 0 0  Restless 0 0 0 0  Easily annoyed or irritable 0 0 0 0  Afraid - awful might happen 0 0 0 0  Total GAD 7 Score 0 0 0 0  Anxiety Difficulty Not difficult at all Not difficult at all - Not difficult at all    BP Readings from Last 3 Encounters:  04/17/21 132/82  04/07/21 122/64  02/02/21 130/82    Physical Exam Vitals and nursing note reviewed.  Constitutional:      General: She is not in acute distress.    Appearance: She is well-developed.  HENT:     Head: Normocephalic and atraumatic.  Cardiovascular:     Rate and Rhythm: Normal rate and regular rhythm.     Pulses:  Normal pulses.     Heart sounds: No murmur heard. Pulmonary:     Effort: Pulmonary effort is normal. No respiratory distress.     Breath sounds: No wheezing or rhonchi.  Musculoskeletal:     Cervical back: Normal range of motion.     Right lower leg: No edema.     Left lower leg: No edema.  Skin:    General: Skin is warm and dry.     Findings: No rash.  Neurological:     Mental Status: She is alert and oriented to person, place, and time. Mental status is at baseline.  Psychiatric:  Mood and Affect: Mood normal.        Behavior: Behavior normal.    Wt Readings from Last 3 Encounters:  04/17/21 156 lb (70.8 kg)  04/07/21 152 lb 6.4 oz (69.1 kg)  02/02/21 159 lb (72.1 kg)    BP 132/82   Pulse 71   Ht _0  (1.473 m)   Wt 156 lb (70.8 kg)   SpO2 93%   BMI 32.60 kg/m   Assessment and Plan: 1. Type II diabetes mellitus with complication (HCC) Clinically stable by exam and report without s/s of hypoglycemia. DM complicated by hypertension and dyslipidemia. Tolerating medications well without side effects or other concerns. - POCT glycosylated hemoglobin (Hb A1C)= 6.2 down from 7.2  2. Hyperlipidemia associated with type 2 diabetes mellitus (HCC) Stable on atorvastatin  3. Acute cystitis without hematuria Symptoms have now resolved.   Partially dictated using Editor, commissioning. Any errors are unintentional.  Halina Maidens, MD [facility]  04/17/2021

## 2021-04-18 ENCOUNTER — Other Ambulatory Visit: Payer: Self-pay | Admitting: *Deleted

## 2021-04-18 NOTE — Patient Instructions (Signed)
Visit Information  Thank you for taking time to visit with me today. Please don't hesitate to contact me if I can be of assistance to you before our next scheduled telephone appointment.  Following are the goals we discussed today:  Current Barriers:  Knowledge Deficits related to plan of care for management of DMII   RNCM Clinical Goal(s):  Patient will verbalize understanding of plan for management of DMII as evidenced by continuation of monitoring blood sugars and adhering to diabetic diet through collaboration with RN Care manager, provider, and care team.   Interventions: Inter-disciplinary care team collaboration (see longitudinal plan of care) Evaluation of current treatment plan related to  self management and patient's adherence to plan as established by provider RN discussed healthy eating RN discussed continuing exercise routine RN encouraged socializing RN encouraged continuing with progress of A1C 6.2  Patient Goals/Self-Care Activities: Take medications as prescribed   Attend all scheduled provider appointments Call pharmacy for medication refills 3-7 days in advance of running out of medications Perform all self care activities independently  Perform IADL's (shopping, preparing meals, housekeeping, managing finances) independently Call provider office for new concerns or questions  schedule appointment with eye doctor check blood sugar at prescribed times: three times daily check feet daily for cuts, sores or redness enter blood sugar readings and medication or insulin into daily log take the blood sugar meter to all doctor visits trim toenails straight across manage portion size    Our next appointment is by telephone on February 2023  Please call the care guide team at [phone] if you need to cancel or reschedule your appointment.   Please call the Suicide and Crisis Lifeline: 988 if you are experiencing a Mental Health or Behavioral Health Crisis or need  someone to talk to.  The patient verbalized understanding of instructions, educational materials, and care plan provided today and agreed to receive a mailed copy of patient instructions, educational materials, and care plan.   Telephone follow up appointment with care management team member scheduled for: The patient has been provided with contact information for the care management team and has been advised to call with any health related questions or concerns.   SIGNATURE  [name] BSN RN [facility] Care Management [phone]

## 2021-04-18 NOTE — Patient Outreach (Signed)
[facility]) Care Management [place] Note   04/18/2021 Name:  Sharon York MRN:  [id number] DOB:  1946/03/05  Summary: Fasting blood sugar 145. A1C 6.2.Patient is walking daily in the evenings. She is going to the center and exercising 3x week. Patient uses a walker. Patient drinks Ensure at night. Her appetite is good. Patient has been getting out socializing. She has not had any recent falls. Per patient her blood pressure is better.   Recommendations/Changes made from today's visit: Continue monitoring blood sugars Continue exercise routine Monitor diet during Holidays for portion control and healthy eating.   Subjective: Sharon York is an 75 y.o. year old female who is a primary patient of Army Melia Jesse Sans, MD. The care management team was consulted for assistance with care management and/or care coordination needs.    RN Health Coach completed Telephone Visit today.   Objective:  Medications Reviewed Today     Reviewed by Clista Bernhardt, CMA (Certified Medical Assistant) on 04/17/21 at [place] List Status: <None>   Medication Order Taking? Sig Documenting Provider Last Dose Status Informant  ACCU-CHEK AVIVA PLUS test strip [id number]  1 each by Other route 4 (four) times daily. [name], MD  Active   Accu-Chek Softclix Lancets lancets [id number]  USE TO CHECK GLUCOSE UP TO 4 TIMES DAILY [name], MD  Active   alendronate (FOSAMAX) 70 MG tablet [id number]  TAKE 1 TABLET BY MOUTH ONCE A WEEK. TAKE WITH A FULL GLASS OF WATER ON AN EMPTY STOMACH. [name], MD  Active   aspirin 81 MG chewable tablet [id number]  Chew 1 tablet by mouth daily. [provider]  Active            Med Note Oneita Jolly Dec 07, 2019 10:57 AM)    atorvastatin (LIPITOR) 10 MG tablet [id number]  Take 1 tablet (10 mg total) by mouth daily. [name], MD  Active   baclofen (LIORESAL) 10 MG tablet [id number]  Take 1 tablet  by mouth twice daily [name], MD  Active   blood glucose meter kit and supplies [id number]  Dispense based on patient and insurance preference. Use up to four times daily as directed. (FOR ICD-10 E10.9, E11.9). [name], MD  Active   Calcium Carbonate-Vitamin D 500-125 MG-UNIT TABS [id number]  Take 1 tablet by mouth daily.  [provider]  Active            Med Note Oneita Jolly Dec 07, 2019 10:57 AM)    glucose blood (ACCU-CHEK GUIDE) test strip [id number]  Use as instructed [name], MD  Active   lisinopril (ZESTRIL) 2.5 MG tablet [id number]  Take 1 tablet (2.5 mg total) by mouth daily. [name], MD  Active   metFORMIN (GLUCOPHAGE) 500 MG tablet [id number]  Take 1 tablet (500 mg total) by mouth daily with breakfast. [name], MD  Active   Multiple Vitamins-Minerals (MULTIVITAMIN ADULT PO) [id number]  Take by mouth. [provider]  Active Self  nystatin cream (MYCOSTATIN) [id number]  Apply 1 application topically 2 (two) times daily. To skin under breast Army Melia Jesse Sans, MD  Active              SDOH:  (Social Determinants of Health) assessments and interventions performed:  SDOH Interventions    Flowsheet Row Most Recent Value  SDOH Interventions   Housing Interventions  Intervention Not Indicated  Intimate Partner Violence Interventions Intervention Not Indicated  Physical Activity Interventions Intervention Not Indicated       Care Plan  Review of patient past medical history, allergies, medications, health status, including review of consultants reports, laboratory and other test data, was performed as part of comprehensive evaluation for care management services.   Care Plan : Diabetes Type 2 (Adult)  Updates made by Verlin Grills, RN since 04/18/2021 12:00 AM     Problem: Glycemic Management (Diabetes, Type 2) Resolved 04/18/2021  Priority: Medium  Onset Date: 05/17/2020  Note:   Resolving  due to duplicate goal     Long-Range Goal: Glycemic Management Optimized Completed 04/18/2021  Start Date: 03/04/2020  Expected End Date: 05/19/2021  Recent Progress: On track  Priority: High  Note:   Evidence-based guidance:  Anticipate A1C testing (point-of-care) every 3 to 6 months based on goal attainment.  Review mutually-set A1C goal or target range.  Anticipate use of antihyperglycemic with or without insulin and periodic adjustments; consider active involvement of pharmacist.  Provide medical nutrition therapy and development of individualized eating.  Compare self-reported symptoms of hypo or hyperglycemia to blood glucose levels, diet and fluid intake, current medications, psychosocial and physiologic stressors, change in activity and barriers to care adherence.  Promote self-monitoring of blood glucose levels.  Assess and address barriers to management plan, such as food insecurity, age, developmental ability, depression, anxiety, fear of hypoglycemia or weight gain, as well as medication cost, side effects and complicated regimen.  Consider referral to community-based diabetes education program, visiting nurse, community health worker or health coach.  Encourage regular dental care for treatment of periodontal disease; refer to dental provider when needed.   Notes:  [id number] Patient is going to center to exercise, and have socialization and lunch twice a week.    Task: Alleviate Barriers to Glycemic Management Completed 04/18/2021  Due Date: 05/19/2021  Note:                         Care Management Activities:    - blood glucose monitoring encouraged - blood glucose readings reviewed - mutual A1C goal set or reviewed - self-awareness of signs/symptoms of hypo or hyperglycemia encouraged - use of blood glucose monitoring log promoted    Notes:     Problem: Disease Progression (Diabetes, Type 2) Resolved 04/18/2021  Priority: Medium  Onset Date: 05/19/2020  Note:    Resolving due to duplicate goal     Long-Range Goal: Disease Progression Prevented or Minimized Completed 04/18/2021  Start Date: 03/04/2020  Expected End Date: 05/19/2021  Recent Progress: On track  Priority: Medium  Note:   Evidence-based guidance:  Prepare patient for laboratory and diagnostic exams based on risk and presentation.  Encourage lifestyle changes, such as increased intake of plant-based foods, stress reduction, consistent physical activity and smoking cessation to prevent long-term complications and chronic disease.   Individualize activity and exercise recommendations while considering potential limitations, such as neuropathy, retinopathy or the ability to prevent hyperglycemia or hypoglycemia.   Prepare patient for use of pharmacologic therapy that may include antihypertensive, analgesic, prostaglandin E1 with periodic adjustments, based on presenting chronic condition and laboratory results.  Assess signs/symptoms and risk factors for hypertension, sleep-disordered breathing, neuropathy (including changes in gait and balance), retinopathy, nephropathy and sexual dysfunction.  Address pregnancy planning and contraceptive choice, especially when prescribing antihypertensive or statin.  Ensure completion of annual comprehensive foot exam and dilated eye exam.  Implement additional individualized goals and interventions based on identified risk factors.  Prepare patient for consultation or referral for specialist care, such as ophthalmology, neurology, cardiology, podiatry, nephrology or perinatology.   Notes:  [id number] Patient is     Task: Monitor and Manage Follow-up for Comorbidities Completed 04/18/2021  Due Date: 11/17/2020  Note:   Care Management Activities:    - activity based on tolerance and functional limitations encouraged - completion of annual foot exam verified - healthy lifestyle promoted - reduction of sedentary activity encouraged - signs/symptoms  of comorbidities identified    Notes:     Care Plan : RN Care Manager Plan of Care  Updates made by Walterine Amodei, Eppie Gibson, RN since 04/18/2021 12:00 AM     Problem: Knowledge Deficit Related to Diabetes and Care Coortdinationa Needs   Priority: High     Long-Range Goal: Development Plan of Care for Management of Diabetes   Start Date: 04/18/2021  Expected End Date: 05/19/2022  Priority: High  Note:   Current Barriers:  Knowledge Deficits related to plan of care for management of DMII   RNCM Clinical Goal(s):  Patient will verbalize understanding of plan for management of DMII as evidenced by continuation of monitoring blood sugars and adhering to diabetic diet  through collaboration with RN Care manager, provider, and care team.   Interventions: Inter-disciplinary care team collaboration (see longitudinal plan of care) Evaluation of current treatment plan related to  self management and patient's adherence to plan as established by provider RN discussed healthy eating RN discussed continuing exercise routine RN encouraged socializing RN encouraged continuing with progress of A1C 6.2  Patient Goals/Self-Care Activities: Take medications as prescribed   Attend all scheduled provider appointments Call pharmacy for medication refills 3-7 days in advance of running out of medications Perform all self care activities independently  Perform IADL's (shopping, preparing meals, housekeeping, managing finances) independently Call provider office for new concerns or questions  schedule appointment with eye doctor check blood sugar at prescribed times: three times daily check feet daily for cuts, sores or redness enter blood sugar readings and medication or insulin into daily log take the blood sugar meter to all doctor visits trim toenails straight across manage portion size        Plan: Telephone follow up appointment with care management team member scheduled for:  February  2023 Patient will continue exercise routine Patient will continue to monitor blood sugars   [place] Management [phone]

## 2021-04-24 ENCOUNTER — Telehealth: Payer: Self-pay | Admitting: Internal Medicine

## 2021-04-24 NOTE — Telephone Encounter (Signed)
[facility] called and spoke to [place], [facility] about the refill(s) Accu-check guide test strips requested. Advised it was sent on 03/28/21 #100/12 refill(s). She says they have it and will update her protocol to remove the aviva's.

## 2021-04-24 NOTE — Telephone Encounter (Signed)
Medication Refill - Medication:  glucose blood (ACCU-CHEK GUIDE) test strip   Has the patient contacted their pharmacy? Yes.   Contact PCP  Preferred Pharmacy (with phone number or street name):  [facility] [address], [place] - [address]  [address] [place] [address]  Phone:  [phone]  Fax:  [phone]   Has the patient been seen for an appointment in the last year OR does the patient have an upcoming appointment? Yes.    Agent: Please be advised that RX refills may take up to 3 business days. We ask that you follow-up with your pharmacy.

## 2021-04-28 ENCOUNTER — Telehealth: Payer: Self-pay | Admitting: Internal Medicine

## 2021-04-28 NOTE — Telephone Encounter (Signed)
Copied from CRM [id number]. Topic: General - Other >> Apr 28, 2021  3:59 PM [place], Sharon York wrote: Reason for CRM: Pt called in stating she lost her hearing aid and wanted to know how she would be able to get York new one, please advise.

## 2021-04-28 NOTE — Telephone Encounter (Signed)
Spoke to pt let her know that she would have to see an audiologist. Sharon York pt the number to [facility] in [place]. [phone]. Pt verbalized understanding.  KP

## 2021-04-29 ENCOUNTER — Other Ambulatory Visit: Payer: Self-pay | Admitting: Internal Medicine

## 2021-04-29 DIAGNOSIS — I69354 Hemiplegia and hemiparesis following cerebral infarction affecting left non-dominant side: Secondary | ICD-10-CM

## 2021-04-29 NOTE — Telephone Encounter (Signed)
Requested medication (s) are due for refill today: yes  Requested medication (s) are on the active medication list: yes  Last refill:  01/19/21 #180  Future visit scheduled: yes  Notes to clinic:  med not delegated to NT to RF   Requested Prescriptions  Pending Prescriptions Disp Refills   baclofen (LIORESAL) 10 MG tablet [Pharmacy Med Name: Baclofen 10 MG Oral Tablet] 180 tablet 0    Sig: Take 1 tablet by mouth twice daily     Not Delegated - Analgesics:  Muscle Relaxants Failed - 04/29/2021 11:44 AM      Failed - This refill cannot be delegated      Passed - Valid encounter within last 6 months    Recent Outpatient Visits           1 week ago Type II diabetes mellitus with complication [facility])   [facility] Reubin Milan, MD   3 weeks ago Acute cystitis without hematuria   [facility] Reubin Milan, MD   2 months ago Type II diabetes mellitus with complication [facility])   [facility] Reubin Milan, MD   4 months ago Annual physical exam   [facility] Reubin Milan, MD   4 months ago Pruritic disorder   [facility] Reubin Milan, MD       Future Appointments             In 1 month [place], Elana Alm [facility]   In 3 months Reubin Milan, MD [facility]   In 7 months Judithann Square, Nyoka Cowden, MD [facility]

## 2021-05-01 ENCOUNTER — Other Ambulatory Visit: Payer: Self-pay

## 2021-05-01 DIAGNOSIS — I69354 Hemiplegia and hemiparesis following cerebral infarction affecting left non-dominant side: Secondary | ICD-10-CM

## 2021-05-01 MED ORDER — BACLOFEN 10 MG PO TABS: 10.0000 mg | ORAL_TABLET | Freq: Two times a day (BID) | ORAL | 1 refills | Status: DC

## 2021-05-01 NOTE — Telephone Encounter (Signed)
Pt called to check the status of refill request for Baclofen / please advise

## 2021-05-09 ENCOUNTER — Encounter: Payer: Self-pay | Admitting: Internal Medicine

## 2021-05-09 ENCOUNTER — Other Ambulatory Visit: Payer: Self-pay

## 2021-05-09 ENCOUNTER — Ambulatory Visit (INDEPENDENT_AMBULATORY_CARE_PROVIDER_SITE_OTHER): Payer: Medicare Other | Admitting: Internal Medicine

## 2021-05-09 VITALS — BP 124/70 | HR 90 | Temp 98.8°F | Ht <= 58 in | Wt 154.0 lb

## 2021-05-09 DIAGNOSIS — J069 Acute upper respiratory infection, unspecified: Secondary | ICD-10-CM

## 2021-05-09 LAB — POC COVID19 BINAXNOW: SARS Coronavirus 2 Ag: NEGATIVE

## 2021-05-09 MED ORDER — PROMETHAZINE-DM 6.25-15 MG/5ML PO SYRP: 5.0000 mL | ORAL_SOLUTION | Freq: Four times a day (QID) | ORAL | 0 refills | Status: DC | PRN

## 2021-05-09 NOTE — Progress Notes (Signed)
Date:  05/09/2021   Name:  Sharon York   DOB:  [birth year]   MRN:  [id number]   Chief Complaint: Sinusitis  URI  This is a new problem. The current episode started yesterday. There has been no fever. Associated symptoms include congestion, coughing, headaches, a sore throat and wheezing. Pertinent negatives include no abdominal pain, chest pain, diarrhea or ear pain. She has tried decongestant for the symptoms. Taking mucinex with no relief.   Lab Results  Component Value Date   NA 140 12/07/2020   K 4.7 12/07/2020   CO2 27 12/07/2020   GLUCOSE 102 (H) 12/07/2020   BUN 9 12/07/2020   CREATININE 0.68 12/07/2020   CALCIUM 9.9 12/07/2020   EGFR 90 12/07/2020   GFRNONAA 79 12/03/2019   Lab Results  Component Value Date   CHOL 172 12/07/2020   HDL 71 12/07/2020   LDLCALC 86 12/07/2020   TRIG 81 12/07/2020   CHOLHDL 2.4 12/07/2020   Lab Results  Component Value Date   TSH 3.620 12/07/2020   Lab Results  Component Value Date   HGBA1C 6.2 (A) 04/17/2021   Lab Results  Component Value Date   WBC 6.0 12/07/2020   HGB 12.4 12/07/2020   HCT 39.2 12/07/2020   MCV 73 (L) 12/07/2020   PLT 300 12/07/2020   Lab Results  Component Value Date   ALT 15 12/07/2020   AST 21 12/07/2020   ALKPHOS 52 12/07/2020   BILITOT 0.3 12/07/2020   No results found for: 25OHVITD2, 25OHVITD3, VD25OH   Review of Systems  Constitutional:  Negative for chills, fatigue and fever.  HENT:  Positive for congestion and sore throat. Negative for ear pain and trouble swallowing.   Respiratory:  Positive for cough and wheezing. Negative for shortness of breath.   Cardiovascular:  Negative for chest pain.  Gastrointestinal:  Negative for abdominal distention, abdominal pain and diarrhea.  Neurological:  Positive for headaches. Negative for dizziness and light-headedness.   Patient Active Problem List   Diagnosis Date Noted   Microalbuminuria due to type 2 diabetes mellitus ([place]) 12/07/2019    Type II diabetes mellitus with complication ([place]) 58/01/9832   Right shoulder pain 03/31/2018   Hyperlipidemia associated with type 2 diabetes mellitus ([place]) 06/12/2017   Tinea corporis 03/17/2015   Abnormal gait 12/15/2014   Altered bowel function 12/15/2014   Hemiparesis affecting left side as late effect of cerebrovascular accident (CVA) ([place]) 12/15/2014   Obstructive sleep apnea of adult 12/15/2014   Arthritis of shoulder region, degenerative 12/15/2014   OP (osteoporosis) 12/15/2014   Allergic rhinitis, seasonal 12/15/2014   Mixed incontinence 03/11/2013    Allergies  Allergen Reactions   Nitrofurantoin Nausea Only   Iodinated Diagnostic Agents Hives and Cough    Patient developed a hive on her left lip after injection as well as some coughing.     Past Surgical History:  Procedure Laterality Date   [place]   COLONOSCOPY  2010   normal    Social History   Tobacco Use   Smoking status: Never   Smokeless tobacco: Never   Tobacco comments:    smoking cessation materials not required  Vaping Use   Vaping Use: Never used  Substance Use Topics   Alcohol use: No    Alcohol/week: 0.0 standard drinks   Drug use: Never     Medication list has been reviewed and updated.  Current Meds  Medication Sig   Accu-Chek Softclix Lancets lancets USE  TO CHECK GLUCOSE UP TO 4 TIMES DAILY   alendronate (FOSAMAX) 70 MG tablet TAKE 1 TABLET BY MOUTH ONCE A WEEK. TAKE WITH A FULL GLASS OF WATER ON AN EMPTY STOMACH.   aspirin 81 MG chewable tablet Chew 1 tablet by mouth daily.   atorvastatin (LIPITOR) 10 MG tablet Take 1 tablet (10 mg total) by mouth daily.   baclofen (LIORESAL) 10 MG tablet Take 1 tablet (10 mg total) by mouth 2 (two) times daily.   blood glucose meter kit and supplies Dispense based on patient and insurance preference. Use up to four times daily as directed. (FOR ICD-10 E10.9, E11.9).   Calcium Carbonate-Vitamin D 500-125 MG-UNIT TABS Take 1 tablet by  mouth daily.    glucose blood (ACCU-CHEK GUIDE) test strip Use as instructed   lisinopril (ZESTRIL) 2.5 MG tablet Take 1 tablet (2.5 mg total) by mouth daily.   metFORMIN (GLUCOPHAGE) 500 MG tablet Take 1 tablet (500 mg total) by mouth daily with breakfast.   Multiple Vitamins-Minerals (MULTIVITAMIN ADULT PO) Take by mouth.   nystatin cream (MYCOSTATIN) Apply 1 application topically 2 (two) times daily. To skin under breast    PHQ 2/9 Scores 05/09/2021 04/17/2021 04/07/2021 02/02/2021  PHQ - 2 Score 0 0 0 0  PHQ- 9 Score 0 _0 GAD 7 : Generalized Anxiety Score 05/09/2021 04/17/2021 04/07/2021 02/02/2021  Nervous, Anxious, on Edge 0 0 0 0  Control/stop worrying 0 0 0 0  Worry too much - different things 0 0 0 0  Trouble relaxing 0 0 0 0  Restless 0 0 0 0  Easily annoyed or irritable 0 0 0 0  Afraid - awful might happen 0 0 0 0  Total GAD 7 Score 0 0 0 0  Anxiety Difficulty Not difficult at all Not difficult at all Not difficult at all -    BP Readings from Last 3 Encounters:  05/09/21 124/70  04/17/21 132/82  04/07/21 122/64    Physical Exam Vitals and nursing note reviewed.  Constitutional:      General: She is not in acute distress.    Appearance: She is well-developed.  HENT:     Head: Normocephalic and atraumatic.     Nose:     Right Sinus: No maxillary sinus tenderness or frontal sinus tenderness.     Left Sinus: No maxillary sinus tenderness or frontal sinus tenderness.     Mouth/Throat:     Pharynx: Posterior oropharyngeal erythema present. No oropharyngeal exudate.  Cardiovascular:     Rate and Rhythm: Normal rate and regular rhythm.     Pulses: Normal pulses.     Heart sounds: No murmur heard. Pulmonary:     Effort: Pulmonary effort is normal. No respiratory distress.     Breath sounds: Normal breath sounds. No wheezing or rhonchi.  Musculoskeletal:     Cervical back: Normal range of motion.  Lymphadenopathy:     Cervical: No cervical adenopathy.   Skin:    General: Skin is warm and dry.     Findings: No rash.  Neurological:     Mental Status: She is alert and oriented to person, place, and time.  Psychiatric:        Mood and Affect: Mood normal.        Behavior: Behavior normal.    Wt Readings from Last 3 Encounters:  05/09/21 154 lb (69.9 kg)  04/17/21 156 lb (70.8 kg)  04/07/21 152 lb 6.4 oz (69.1 kg)  BP 124/70    Pulse 90    Temp 98.8 F (37.1 C) (Oral)    Ht _0  (1.473 m)    Wt 154 lb (69.9 kg)    SpO2 97%    BMI 32.19 kg/m   Assessment and Plan: 1. Viral URI with cough No evidence of bacterial infection. Take Xyzal 5 mg daily for PND and congestion Use cough syrup at night instead of going to bed with a cough drop. - promethazine-dextromethorphan (PROMETHAZINE-DM) 6.25-15 MG/5ML syrup; Take 5 mLs by mouth 4 (four) times daily as needed for cough.  Dispense: 118 mL; Refill: 0 - POC COVID-19 BinaxNow - negative   Partially dictated using Editor, commissioning. Any errors are unintentional.  Halina Maidens, MD [facility]  05/09/2021

## 2021-05-09 NOTE — Patient Instructions (Signed)
Take the cough syrup at night for cough.  Do not go to bed with a lozenge in your mouth.  Take Xyzal 5 mg once a day for cold symptoms.

## 2021-05-18 ENCOUNTER — Ambulatory Visit: Payer: Self-pay | Admitting: *Deleted

## 2021-05-18 NOTE — Telephone Encounter (Signed)
Pt calling in stating that she still has a cold that she was seen for on 12/20, she states that she medication she was prescribed isn't working for her cough and stuffy nose and is seeking medical advise     Attempted to reach pt, extended ring, unable to leave message. Will continue attempts to reach pt.

## 2021-05-18 NOTE — Telephone Encounter (Signed)
For your information. Please advise for continued symptoms from 05/09/21 if applicable.  RN triage has been unsuccessful in reaching patient.

## 2021-05-18 NOTE — Telephone Encounter (Addendum)
3rd attempt to reach pt, unable to leave VM to call back;  last attempt busy signal, number verified. Routing to provider for resolution per protocol.

## 2021-05-19 NOTE — Telephone Encounter (Signed)
Tried calling pt. Unable to reach her to discuss current symptoms.

## 2021-05-26 ENCOUNTER — Ambulatory Visit (INDEPENDENT_AMBULATORY_CARE_PROVIDER_SITE_OTHER): Payer: Commercial Managed Care - HMO | Admitting: Internal Medicine

## 2021-05-26 ENCOUNTER — Other Ambulatory Visit: Payer: Self-pay

## 2021-05-26 ENCOUNTER — Encounter: Payer: Self-pay | Admitting: Internal Medicine

## 2021-05-26 VITALS — BP 140/80 | HR 74 | Temp 97.3°F | Resp 95 | Ht <= 58 in | Wt 154.0 lb

## 2021-05-26 DIAGNOSIS — J40 Bronchitis, not specified as acute or chronic: Secondary | ICD-10-CM | POA: Diagnosis not present

## 2021-05-26 DIAGNOSIS — H6121 Impacted cerumen, right ear: Secondary | ICD-10-CM | POA: Diagnosis not present

## 2021-05-26 MED ORDER — AZITHROMYCIN 250 MG PO TABS: ORAL_TABLET | ORAL | 0 refills | Status: AC

## 2021-05-26 MED ORDER — PROMETHAZINE-DM 6.25-15 MG/5ML PO SYRP
5.0000 mL | ORAL_SOLUTION | Freq: Four times a day (QID) | ORAL | 0 refills | Status: DC | PRN
Start: 2021-05-26 — End: 2021-07-12

## 2021-05-26 NOTE — Progress Notes (Signed)
Date:  05/26/2021   Name:  Sharon York   DOB:  [birth year]   MRN:  [id number]   Chief Complaint: Cough  Cough This is a recurrent problem. The current episode started 1 to 4 weeks ago. The problem has been waxing and waning. The problem occurs every few minutes. The cough is Productive of sputum. Associated symptoms include ear congestion and nasal congestion. Pertinent negatives include no chest pain, chills, ear pain, fever, headaches, shortness of breath or wheezing. The symptoms are aggravated by lying down.   Lab Results  Component Value Date   NA 140 12/07/2020   K 4.7 12/07/2020   CO2 27 12/07/2020   GLUCOSE 102 (H) 12/07/2020   BUN 9 12/07/2020   CREATININE 0.68 12/07/2020   CALCIUM 9.9 12/07/2020   EGFR 90 12/07/2020   GFRNONAA 79 12/03/2019   Lab Results  Component Value Date   CHOL 172 12/07/2020   HDL 71 12/07/2020   LDLCALC 86 12/07/2020   TRIG 81 12/07/2020   CHOLHDL 2.4 12/07/2020   Lab Results  Component Value Date   TSH 3.620 12/07/2020   Lab Results  Component Value Date   HGBA1C 6.2 (A) 04/17/2021   Lab Results  Component Value Date   WBC 6.0 12/07/2020   HGB 12.4 12/07/2020   HCT 39.2 12/07/2020   MCV 73 (L) 12/07/2020   PLT 300 12/07/2020   Lab Results  Component Value Date   ALT 15 12/07/2020   AST 21 12/07/2020   ALKPHOS 52 12/07/2020   BILITOT 0.3 12/07/2020   No results found for: 25OHVITD2, 25OHVITD3, VD25OH   Review of Systems  Constitutional:  Negative for chills, fatigue and fever.  HENT:  Positive for hearing loss (and impacted cerumen on the right). Negative for ear pain and trouble swallowing.   Respiratory:  Positive for cough. Negative for chest tightness, shortness of breath and wheezing.   Cardiovascular:  Negative for chest pain and palpitations.  Gastrointestinal:  Negative for diarrhea and nausea.  Neurological:  Negative for dizziness and headaches.   Patient Active Problem List   Diagnosis Date Noted    Microalbuminuria due to type 2 diabetes mellitus ([place]) 12/07/2019   Type II diabetes mellitus with complication ([place]) 12/45/8099   Right shoulder pain 03/31/2018   Hyperlipidemia associated with type 2 diabetes mellitus ([place]) 06/12/2017   Tinea corporis 03/17/2015   Abnormal gait 12/15/2014   Altered bowel function 12/15/2014   Hemiparesis affecting left side as late effect of cerebrovascular accident (CVA) ([place]) 12/15/2014   Obstructive sleep apnea of adult 12/15/2014   Arthritis of shoulder region, 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     Past Surgical History:  Procedure Laterality Date   [place]   COLONOSCOPY  2010   normal    Social History   Tobacco Use   Smoking status: Never   Smokeless tobacco: Never   Tobacco comments:    smoking cessation materials not required  Vaping Use   Vaping Use: Never used  Substance Use Topics   Alcohol use: No    Alcohol/week: 0.0 standard drinks   Drug use: Never     Medication list has been reviewed  and updated.  Current Meds  Medication Sig   Accu-Chek Softclix Lancets lancets USE TO CHECK GLUCOSE UP TO 4 TIMES DAILY   alendronate (FOSAMAX) 70 MG tablet TAKE 1 TABLET BY MOUTH ONCE A WEEK. TAKE WITH A FULL GLASS OF WATER ON AN EMPTY STOMACH.   aspirin 81 MG chewable tablet Chew 1 tablet by mouth daily.   atorvastatin (LIPITOR) 10 MG tablet Take 1 tablet (10 mg total) by mouth daily.   azithromycin (ZITHROMAX Z-PAK) 250 MG tablet UAD   baclofen (LIORESAL) 10 MG tablet Take 1 tablet (10 mg total) by mouth 2 (two) times daily.   blood glucose meter kit and supplies Dispense based on patient and insurance preference. Use up to four  times daily as directed. (FOR ICD-10 E10.9, E11.9).   Calcium Carbonate-Vitamin D 500-125 MG-UNIT TABS Take 1 tablet by mouth daily.    glucose blood (ACCU-CHEK GUIDE) test strip Use as instructed   lisinopril (ZESTRIL) 2.5 MG tablet Take 1 tablet (2.5 mg total) by mouth daily.   metFORMIN (GLUCOPHAGE) 500 MG tablet Take 1 tablet (500 mg total) by mouth daily with breakfast.   Multiple Vitamins-Minerals (MULTIVITAMIN ADULT PO) Take by mouth.   nystatin cream (MYCOSTATIN) Apply 1 application topically 2 (two) times daily. To skin under breast   promethazine-dextromethorphan (PROMETHAZINE-DM) 6.25-15 MG/5ML syrup Take 5 mLs by mouth 4 (four) times daily as needed for cough.    PHQ 2/9 Scores 05/26/2021 05/09/2021 04/17/2021 04/07/2021  PHQ - 2 Score 0 0 0 0  PHQ- 9 Score 0 0 1 1    GAD 7 : Generalized Anxiety Score 05/26/2021 05/09/2021 04/17/2021 04/07/2021  Nervous, Anxious, on Edge 0 0 0 0  Control/stop worrying 0 0 0 0  Worry too much - different things 0 0 0 0  Trouble relaxing 0 0 0 0  Restless 0 0 0 0  Easily annoyed or irritable 0 0 0 0  Afraid - awful might happen 0 0 0 0  Total GAD 7 Score 0 0 0 0  Anxiety Difficulty Not difficult at all Not difficult at all Not difficult at all Not difficult at all    BP Readings from Last 3 Encounters:  05/26/21 140/80  05/09/21 124/70  04/17/21 132/82    Physical Exam Vitals and nursing note reviewed.  Constitutional:      General: She is not in acute distress.    Appearance: She is well-developed.  HENT:     Head: Normocephalic and atraumatic.     Right Ear: There is impacted cerumen.     Left Ear: There is no impacted cerumen.  Cardiovascular:     Rate and Rhythm: Normal rate and regular rhythm.  Pulmonary:     Effort: Pulmonary effort is normal. No respiratory distress.     Breath sounds: Transmitted upper airway sounds present. No wheezing or rhonchi.  Skin:    General: Skin is warm and dry.     Findings: No rash.   Neurological:     Mental Status: She is alert and oriented to person, place, and time.  Psychiatric:        Mood and Affect: Mood normal.        Behavior: Behavior normal.    Wt Readings from Last 3 Encounters:  05/26/21 154 lb (69.9 kg)  05/09/21 154 lb (69.9 kg)  04/17/21 156 lb (70.8 kg)    BP 140/80    Pulse 74    Temp (!) 97.3 F (36.3 C)  Resp (!) 95    Ht _0  (1.473 m)    Wt 154 lb (69.9 kg)    BMI 32.19 kg/m   Assessment and Plan: 1. Bronchitis Worsening cough and chest sx after URI. Will treat with Zpak.  Follow up if needed. - azithromycin (ZITHROMAX Z-PAK) 250 MG tablet; UAD  Dispense: 6 each; Refill: 0 - promethazine-dextromethorphan (PROMETHAZINE-DM) 6.25-15 MG/5ML syrup; Take 5 mLs by mouth 4 (four) times daily as needed for cough.  Dispense: 180 mL; Refill: 0  2. Impacted cerumen of right ear - Ambulatory referral to ENT   Partially dictated using Dragon software. Any errors are unintentional.  Halina Maidens, MD [facility]  05/26/2021

## 2021-05-29 ENCOUNTER — Ambulatory Visit: Payer: Medicare Other | Admitting: Urology

## 2021-06-08 ENCOUNTER — Other Ambulatory Visit: Payer: Self-pay | Admitting: Internal Medicine

## 2021-06-08 DIAGNOSIS — E118 Type 2 diabetes mellitus with unspecified complications: Secondary | ICD-10-CM

## 2021-06-08 NOTE — Telephone Encounter (Signed)
Requested Prescriptions  °Pending Prescriptions Disp Refills  °• metFORMIN (GLUCOPHAGE) 500 MG tablet [Pharmacy Med Name: metFORMIN HCl 500 MG Oral Tablet] 90 tablet 0  °  Sig: TAKE 1 TABLET BY MOUTH ONCE DAILY WITH BREAKFAST. REPLACES 250MG DOSE  °  ° Endocrinology:  Diabetes - Biguanides Failed - 06/08/2021  9:16 AM  °  °  Failed - AA eGFR in normal range and within 360 days  °  GFR calc Af Amer  °Date Value Ref Range Status  °12/03/2019 91 >59 mL/min/1.73 Final  °  Comment:  °  **[facility] currently reports eGFR in compliance with the current** °  recommendations of the [employer]. [facility] will °  update reporting as new guidelines are published from the NKF-ASN °  Task force. °  ° °GFR calc non Af Amer  °Date Value Ref Range Status  °12/03/2019 79 >59 mL/min/1.73 Final  ° °eGFR  °Date Value Ref Range Status  °12/07/2020 90 >59 mL/min/1.73 Final  °   °  °  Passed - Cr in normal range and within 360 days  °  Creatinine, Ser  °Date Value Ref Range Status  °12/07/2020 0.68 0.57 - 1.00 mg/dL Final  °   °  °  Passed - HBA1C is between 0 and 7.9 and within 180 days  °  Hemoglobin A1C  °Date Value Ref Range Status  °04/17/2021 6.2 (A) 4.0 - 5.6 % Final  ° °Hgb A1c MFr Bld  °Date Value Ref Range Status  °12/07/2020 7.2 (H) 4.8 - 5.6 % Final  °  Comment:  °           Prediabetes: 5.7 - 6.4 °         Diabetes: >6.4 °         Glycemic control for adults with diabetes: <7.0 °  °   °  °  Passed - Valid encounter within last 6 months  °  Recent Outpatient Visits   °      ° 1 week ago Bronchitis  ° [facility] Berglund, Laura H, MD  ° 1 month ago Viral URI with cough  ° [facility] Berglund, Laura H, MD  ° 1 month ago Type II diabetes mellitus with complication (HCC)  ° [facility] Berglund, Laura H, MD  ° 2 months ago Acute cystitis without hematuria  ° [facility] Berglund, Laura H, MD  ° 4 months ago Type II diabetes mellitus with complication (HCC)  ° [facility] Berglund, Laura H, MD  °  °  °Future Appointments   °        ° In 4 days McGowan, [name] A, PA-C [facility] [place]  ° In 2 months Berglund, Laura H, MD [facility]  ° In 6 months Berglund, Laura H, MD [facility]  °  ° °  °  °  ° °

## 2021-06-12 ENCOUNTER — Other Ambulatory Visit: Payer: Self-pay

## 2021-06-12 ENCOUNTER — Ambulatory Visit (INDEPENDENT_AMBULATORY_CARE_PROVIDER_SITE_OTHER): Payer: Commercial Managed Care - HMO | Admitting: Urology

## 2021-06-12 DIAGNOSIS — R350 Frequency of micturition: Secondary | ICD-10-CM

## 2021-06-12 NOTE — Progress Notes (Signed)
Patient ID: Sharon York, female   DOB: 1945/11/28, 76 y.o.   MRN: [id number] PTNS  Session # 22 of 45  Health & Social Factors: no change Caffeine: 0 Alcohol: 0 Daytime voids #per day: 4-5 Night-time voids #per night: 2 Urgency: mild Incontinence Episodes #per day: 0 Ankle used: right Treatment Setting: 6 Feeling/ Response: both Comments: pt tolerated well  Performed By: Mervin Hack, CMA  Follow Up: for #23 of 45

## 2021-06-16 ENCOUNTER — Telehealth: Payer: Self-pay | Admitting: Internal Medicine

## 2021-06-16 ENCOUNTER — Other Ambulatory Visit: Payer: Self-pay

## 2021-06-16 MED ORDER — POGO AUTOMATIC TEST CARTRIDGES VI TEST: 1.0000 | Freq: Three times a day (TID) | 3 refills | Status: DC

## 2021-06-16 NOTE — Telephone Encounter (Signed)
Copied from [place] [id number]. Topic: General - Other >> Jun 16, 2021  4:54 PM Tessa Lerner A wrote: Reason for CRM: The patient would like to speak with K. Person when possible  The patient would like to be contacted before 9 AM   The patient has spoken with their pharmacy and would like to discuss their refills further

## 2021-06-16 NOTE — Telephone Encounter (Signed)
Sent in refill for lancets for pop meter. Told pt to call back if she cant get it filled. Pt verbalized understanding.  KP

## 2021-06-16 NOTE — Telephone Encounter (Signed)
*  POGO

## 2021-06-16 NOTE — Telephone Encounter (Signed)
Copied from CRM [id number]. Topic: Quick Communication - Rx Refill/Question >> Jun 16, 2021  3:32 PM Jaquita Rector A wrote: Medication: POGO Automatic Cartridges  Has the patient contacted their pharmacy? Yes.  Need Rx  (Agent: If no, request that the patient contact the pharmacy for the refill. If patient does not wish to contact the pharmacy document the reason why and proceed with request.) (Agent: If yes, when and what did the pharmacy advise?)  Preferred Pharmacy (with phone number or street name): [id number] - [place], [place] - [address] [place] [address] AT [facility] OF [address] [facility] [address]  Phone:  [phone] Fax:  [phone]    Has the patient been seen for an appointment in the last year OR does the patient have an upcoming appointment? Yes.    Agent: Please be advised that RX refills may take up to 3 business days. We ask that you follow-up with your pharmacy.

## 2021-06-19 NOTE — Telephone Encounter (Signed)
Called pt left VM to call back. Please ask patient how is the pogo cartridge supposed to be ordered.   [facility] nurse may give results to patient if they return call to clinic, a CRM has been created.  KP

## 2021-06-27 ENCOUNTER — Other Ambulatory Visit: Payer: Self-pay | Admitting: Internal Medicine

## 2021-06-27 DIAGNOSIS — M810 Age-related osteoporosis without current pathological fracture: Secondary | ICD-10-CM

## 2021-06-27 DIAGNOSIS — H903 Sensorineural hearing loss, bilateral: Secondary | ICD-10-CM | POA: Diagnosis not present

## 2021-06-27 DIAGNOSIS — H6123 Impacted cerumen, bilateral: Secondary | ICD-10-CM | POA: Diagnosis not present

## 2021-06-28 DIAGNOSIS — H905 Unspecified sensorineural hearing loss: Secondary | ICD-10-CM | POA: Diagnosis not present

## 2021-06-28 NOTE — Telephone Encounter (Signed)
Requested medication (s) are due for refill today: yes  Requested medication (s) are on the active medication list: yes  Last refill:  01/18/21 #12 with 1  RF  Future visit scheduled: 08/16/21  Notes to clinic:  Failed protocol of labs within 360 days, has upcoming appt, please assess.   Requested Prescriptions  Pending Prescriptions Disp Refills   alendronate (FOSAMAX) 70 MG tablet [Pharmacy Med Name: Alendronate Sodium 70 MG Oral Tablet] 12 tablet 0    Sig: TAKE 1 TABLET BY MOUTH ONCE A WEEK. TAKE WITH A FULL GLASS OF WATER ON AN EMPTY STOMACH.     Endocrinology:  Bisphosphonates Failed - 06/27/2021  7:56 PM      Failed - Vitamin D in normal range and within 360 days    No results found for: [id number], EL9532YE3, [id number], 25OHVITD3, 25OHVITD2, 25OHVITD3, 25OHVITD2, 25OHVITD1, 25OHVITD2, 25OHVITD3, VD25OH        Failed - Mg Level in normal range and within 360 days    No results found for: MG        Failed - Phosphate in normal range and within 360 days    No results found for: PHOS        Passed - Ca in normal range and within 360 days    Calcium  Date Value Ref Range Status  12/07/2020 9.9 8.7 - 10.3 mg/dL Final          Passed - Cr in normal range and within 360 days    Creatinine, Ser  Date Value Ref Range Status  12/07/2020 0.68 0.57 - 1.00 mg/dL Final          Passed - eGFR is 30 or above and within 360 days    GFR calc Af Amer  Date Value Ref Range Status  12/03/2019 91 >59 mL/min/1.73 Final    Comment:    **[facility] currently reports eGFR in compliance with the current**   recommendations of the [employer]. [facility] will   update reporting as new guidelines are published from the NKF-ASN   Task force.    GFR calc non Af Amer  Date Value Ref Range Status  12/03/2019 79 >59 mL/min/1.73 Final   eGFR  Date Value Ref Range Status  12/07/2020 90 >59 mL/min/1.73 Final          Passed - Valid encounter within last 12 months    Recent  Outpatient Visits           1 month ago [facility] [name], MD   1 month ago Viral URI with cough   [facility] [name], MD   2 months ago Type II diabetes mellitus with complication [facility])   [facility] [name], MD   2 months ago Acute cystitis without hematuria   [facility] [name], MD   4 months ago Type II diabetes mellitus with complication [facility])   [facility] [name], MD       Future Appointments             In 2 weeks McGowan, Gordan Payment [place]   In 1 month [name], MD [facility] [place]   In 5 months [place] [facility] Jesse Sans, MD [place] Density or Dexa Scan completed in the last 2 years

## 2021-07-05 ENCOUNTER — Other Ambulatory Visit: Payer: Self-pay

## 2021-07-05 ENCOUNTER — Telehealth: Payer: Self-pay

## 2021-07-05 DIAGNOSIS — M810 Age-related osteoporosis without current pathological fracture: Secondary | ICD-10-CM

## 2021-07-05 MED ORDER — ALENDRONATE SODIUM 70 MG PO TABS: ORAL_TABLET | ORAL | 1 refills | Status: DC

## 2021-07-05 NOTE — Telephone Encounter (Signed)
Copied from [place] [id number]. Topic: General - Inquiry >> Jul 05, 2021  9:34 AM Loma Boston wrote: alendronate (FOSAMAX) 70 MG tablet Medication Date: 01/18/2021 Department: [facility] Ordering/Authorizing: [name], MD  Order Providers  Prescribing Provider Encounter Provider [name], MD [name], MD  Outpatient Medication Detail   Disp Refills Start End  alendronate (FOSAMAX) 70 MG tablet 12 tablet 1 01/18/2021   Sig: TAKE 1 TABLET BY MOUTH ONCE A WEEK. TAKE WITH A FULL GLASS OF WATER ON AN EMPTY STOMACH.  Sent to pharmacy as: alendronate (FOSAMAX) 70 MG tablet  E-Prescribing Status: Receipt confirmed by pharmacy (01/18/2021 7:54 AM EDT)   Pt is going to be out Tues FU  [phone] pt needs to know if needs an appt but was just in office on 1/6. Would like FU if needs appt. Pls respond today if possible

## 2021-07-05 NOTE — Telephone Encounter (Signed)
Spoke to pt let her know a refill was sent in. Pt verbalized understanding.  KP

## 2021-07-06 ENCOUNTER — Other Ambulatory Visit: Payer: Self-pay | Admitting: *Deleted

## 2021-07-06 NOTE — Patient Instructions (Signed)
Visit Information  Thank you for taking time to visit with me today. Please don't hesitate to contact me if I can be of assistance to you before our next scheduled telephone appointment.  Following are the goals we discussed today:  urrent Barriers:  Knowledge Deficits related to plan of care for management of DMII   RNCM Clinical Goal(s):  Patient will verbalize understanding of plan for management of DMII as evidenced by continuation of monitoring blood sugars and adhering to diabetic diet through collaboration with RN Care manager, provider, and care team.   Interventions: Inter-disciplinary care team collaboration (see longitudinal plan of care) Evaluation of current treatment plan related to  self management and patient's adherence to plan as established by provider RN discussed healthy eating RN discussed continuing exercise routine RN encouraged socializing RN encouraged continuing with progress of A1C 6.2  Patient Goals/Self-Care Activities: Take medications as prescribed   Attend all scheduled provider appointments Call pharmacy for medication refills 3-7 days in advance of running out of medications Attend church or other social activities Perform all self care activities independently  Perform IADL's (shopping, preparing meals, housekeeping, managing finances) independently Call provider office for new concerns or questions  call the Suicide and Crisis Lifeline: 988 if experiencing a Mental Health or Behavioral Health Crisis  schedule appointment with eye doctor check blood sugar at prescribed times: three times daily check feet daily for cuts, sores or redness enter blood sugar readings and medication or insulin into daily log take the blood sugar meter to all doctor visits trim toenails straight across drink 6 to 8 glasses of water each day manage portion size  urrent Barriers:  Knowledge Deficits related to plan of care for management of DMII   RNCM Clinical  Goal(s):  Patient will verbalize understanding of plan for management of DMII as evidenced by continuation of monitoring blood sugars and adhering to diabetic diet through collaboration with RN Care manager, provider, and care team.   Interventions: Inter-disciplinary care team collaboration (see longitudinal plan of care) Evaluation of current treatment plan related to  self management and patient's adherence to plan as established by provider RN discussed healthy eating RN discussed continuing exercise routine RN encouraged socializing RN encouraged continuing with progress of A1C 6.2  Patient Goals/Self-Care Activities: Take medications as prescribed   Attend all scheduled provider appointments Call pharmacy for medication refills 3-7 days in advance of running out of medications Attend church or other social activities Perform all self care activities independently  Perform IADL's (shopping, preparing meals, housekeeping, managing finances) independently Call provider office for new concerns or questions  call the Suicide and Crisis Lifeline: 988 if experiencing a Mental Health or Behavioral Health Crisis  schedule appointment with eye doctor check blood sugar at prescribed times: three times daily check feet daily for cuts, sores or redness enter blood sugar readings and medication or insulin into daily log take the blood sugar meter to all doctor visits trim toenails straight across drink 6 to 8 glasses of water each day manage portion size  v  Our next appointment is by telephone on May 2023  Please call [name] RN at [phone] if you need to cancel or reschedule your appointment.   Please call the Suicide and Crisis Lifeline: 988 if you are experiencing a Mental Health or Behavioral Health Crisis or need someone to talk to.  The patient verbalized understanding of instructions, educational materials, and care plan provided today and agreed to receive a mailed  copy  of patient instructions, educational materials, and care plan.   Telephone follow up appointment with care management team member scheduled for: The patient has been provided with contact information for the care management team and has been advised to call with any health related questions or concerns.   SIGNATURE  [name] BSN RN [facility] [phone]

## 2021-07-06 NOTE — Patient Outreach (Signed)
[facility]) Care Management [place] Note   07/06/2021 Name:  Sharon York MRN:  [id number] DOB:  14-Mar-1946  Summary: Patient fasting blood sugar is 135.  A1C 6.2. Per patient she is taking her medications as per ordered. She is walking every evening. Per patient she is getting out and socializing. Per patient she is checking her feet daily. Her daughter trims her toenails. She has not had any recent falls.   Recommendations/Changes made from today's visit: Continue daily exercise routine Medication adherence Monitor blood sugars and document Monitor blood pressure and document    Subjective: Sharon York is an 76 y.o. year old female who is a primary patient of [facility] Melia Jesse Sans, MD. The care management team was consulted for assistance with care management and/or care coordination needs.    RN Health Coach completed Telephone Visit today.   Objective:  Medications Reviewed Today     Reviewed by Clista Bernhardt, CMA (Certified Medical Assistant) on 05/26/21 at [place] List Status: <None>   Medication Order Taking? Sig Documenting Provider Last Dose Status Informant  Accu-Chek Softclix Lancets lancets [id number] Yes USE TO CHECK GLUCOSE UP TO 4 TIMES DAILY [name], MD Taking Active   alendronate (FOSAMAX) 70 MG tablet [id number] Yes TAKE 1 TABLET BY MOUTH ONCE A WEEK. TAKE WITH A FULL GLASS OF WATER ON AN EMPTY STOMACH. [name], MD Taking Active   aspirin 81 MG chewable tablet [id number] Yes Chew 1 tablet by mouth daily. [provider] Taking Active            Med Note Oneita Jolly Dec 07, 2019 10:57 AM)    atorvastatin (LIPITOR) 10 MG tablet [id number] Yes Take 1 tablet (10 mg total) by mouth daily. [name], MD Taking Active   baclofen (LIORESAL) 10 MG tablet [id number] Yes Take 1 tablet (10 mg total) by mouth 2 (two) times daily. [name], MD Taking Active   blood glucose meter kit and  supplies [id number] Yes Dispense based on patient and insurance preference. Use up to four times daily as directed. (FOR ICD-10 E10.9, E11.9). [name], MD Taking Active   Calcium Carbonate-Vitamin D 500-125 MG-UNIT TABS [id number] Yes Take 1 tablet by mouth daily.  [provider] Taking Active            Med Note Oneita Jolly Dec 07, 2019 10:57 AM)    glucose blood (ACCU-CHEK GUIDE) test strip [id number] Yes Use as instructed [name], MD Taking Active   lisinopril (ZESTRIL) 2.5 MG tablet [id number] Yes Take 1 tablet (2.5 mg total) by mouth daily. [name], MD Taking Active   metFORMIN (GLUCOPHAGE) 500 MG tablet [id number] Yes Take 1 tablet (500 mg total) by mouth daily with breakfast. [name], MD Taking Active   Multiple Vitamins-Minerals (MULTIVITAMIN ADULT PO) [id number] Yes Take by mouth. [provider] Taking Active Self  nystatin cream (MYCOSTATIN) [id number] Yes Apply 1 application topically 2 (two) times daily. To skin under breast [facility] Melia Jesse Sans, MD Taking Active              SDOH:  (Social Determinants of Health) assessments and interventions performed:  SDOH Interventions    Flowsheet Row Most Recent Value  SDOH Interventions   Food Insecurity Interventions Intervention Not Indicated  Housing Interventions Intervention Not Indicated  Transportation Interventions Intervention Not Indicated  Care Plan  Review of patient past medical history, allergies, medications, health status, including review of consultants reports, laboratory and other test data, was performed as part of comprehensive evaluation for care management services.   Care Plan : RN Care Manager Plan of Care  Updates made by Alda Gaultney, Eppie Gibson, RN since 07/06/2021 12:00 AM     Problem: Knowledge Deficit Related to Diabetes and Care Coortdinationa Needs   Priority: High     Long-Range Goal: Development Plan of Care for Management of  Diabetes   Start Date: 04/18/2021  Expected End Date: 05/19/2022  Priority: High  Note:   Current Barriers:  Knowledge Deficits related to plan of care for management of DMII   RNCM Clinical Goal(s):  Patient will verbalize understanding of plan for management of DMII as evidenced by continuation of monitoring blood sugars and adhering to diabetic diet  through collaboration with RN Care manager, provider, and care team.   Interventions: Inter-disciplinary care team collaboration (see longitudinal plan of care) Evaluation of current treatment plan related to  self management and patient's adherence to plan as established by provider RN discussed healthy eating RN discussed continuing exercise routine RN encouraged socializing RN encouraged continuing with progress of A1C 6.2  Patient Goals/Self-Care Activities: Take medications as prescribed   Attend all scheduled provider appointments Call pharmacy for medication refills 3-7 days in advance of running out of medications Attend church or other social activities Perform all self care activities independently  Perform IADL's (shopping, preparing meals, housekeeping, managing finances) independently Call provider office for new concerns or questions  call the Suicide and Crisis Lifeline: 988 if experiencing a Mental Health or [place]  schedule appointment with eye doctor check blood sugar at prescribed times: three times daily check feet daily for cuts, sores or redness enter blood sugar readings and medication or insulin into daily log take the blood sugar meter to all doctor visits trim toenails straight across drink 6 to 8 glasses of water each day manage portion size        Plan: Telephone follow up appointment with care management team member scheduled for:  Oct 03, 2021 The patient has been provided with contact information for the care management team and has been advised to call with any health related  questions or concerns.   [facility] [phone]

## 2021-07-12 ENCOUNTER — Ambulatory Visit (INDEPENDENT_AMBULATORY_CARE_PROVIDER_SITE_OTHER): Payer: Medicare Other

## 2021-07-12 DIAGNOSIS — Z0000 Encounter for general adult medical examination without abnormal findings: Secondary | ICD-10-CM

## 2021-07-12 NOTE — Patient Instructions (Signed)
[name] , Thank you for taking time to come for your Medicare Wellness Visit. I appreciate your ongoing commitment to your health goals. Please review the following plan we discussed and let me know if I can assist you in the future.   Screening recommendations/referrals: Colonoscopy: no longer required Mammogram: done 03/14/21 Bone Density: done 08/04/20 Recommended yearly ophthalmology/optometry visit for glaucoma screening and checkup Recommended yearly dental visit for hygiene and checkup  Vaccinations: Influenza vaccine: done 02/02/21 Pneumococcal vaccine: done 06/11/17 Tdap vaccine: done 07/10/18 Shingles vaccine: done 01/30/21 & 04/05/21   Covid-19:done 06/01/19, 06/22/19 & 02/25/20  Conditions/risks identified: recommend increasing physical activity   Next appointment: Follow up in one year for your annual wellness visit    Preventive Care 65 Years and Older, Female Preventive care refers to lifestyle choices and visits with your health care provider that can promote health and wellness. What does preventive care include? A yearly physical exam. This is also called an annual well check. Dental exams once or twice a year. Routine eye exams. Ask your health care provider how often you should have your eyes checked. Personal lifestyle choices, including: Daily care of your teeth and gums. Regular physical activity. Eating a healthy diet. Avoiding tobacco and drug use. Limiting alcohol use. Practicing safe sex. Taking low-dose aspirin every day. Taking vitamin and mineral supplements as recommended by your health care provider. What happens during an annual well check? The services and screenings done by your health care provider during your annual well check will depend on your age, overall health, lifestyle risk factors, and family history of disease. Counseling  Your health care provider may ask you questions about your: Alcohol use. Tobacco use. Drug use. Emotional  well-being. Home and relationship well-being. Sexual activity. Eating habits. History of falls. Memory and ability to understand (cognition). Work and work Astronomer. Reproductive health. Screening  You may have the following tests or measurements: Height, weight, and BMI. Blood pressure. Lipid and cholesterol levels. These may be checked every 5 years, or more frequently if you are over 19 years old. Skin check. Lung cancer screening. You may have this screening every year starting at age 68 if you have a 30-pack-year history of smoking and currently smoke or have quit within the past 15 years. Fecal occult blood test (FOBT) of the stool. You may have this test every year starting at age 43. Flexible sigmoidoscopy or colonoscopy. You may have a sigmoidoscopy every 5 years or a colonoscopy every 10 years starting at age 94. Hepatitis C blood test. Hepatitis B blood test. Sexually transmitted disease (STD) testing. Diabetes screening. This is done by checking your blood sugar (glucose) after you have not eaten for a while (fasting). You may have this done every 1-3 years. Bone density scan. This is done to screen for osteoporosis. You may have this done starting at age 40. Mammogram. This may be done every 1-2 years. Talk to your health care provider about how often you should have regular mammograms. Talk with your health care provider about your test results, treatment options, and if necessary, the need for more tests. Vaccines  Your health care provider may recommend certain vaccines, such as: Influenza vaccine. This is recommended every year. Tetanus, diphtheria, and acellular pertussis (Tdap, Td) vaccine. You may need a Td booster every 10 years. Zoster vaccine. You may need this after age 65. Pneumococcal 13-valent conjugate (PCV13) vaccine. One dose is recommended after age 51. Pneumococcal polysaccharide (PPSV23) vaccine. One dose is recommended after  age 52. Talk to your  health care provider about which screenings and vaccines you need and how often you need them. This information is not intended to replace advice given to you by your health care provider. Make sure you discuss any questions you have with your health care provider. Document Released: 06/03/2015 Document Revised: 01/25/2016 Document Reviewed: 03/08/2015 Elsevier Interactive Patient Education  2017 [place] Prevention in the Home Falls can cause injuries. They can happen to people of all ages. There are many things you can do to make your home safe and to help prevent falls. What can I do on the outside of my home? Regularly fix the edges of walkways and driveways and fix any cracks. Remove anything that might make you trip as you walk through a door, such as a raised step or threshold. Trim any bushes or trees on the path to your home. Use bright outdoor lighting. Clear any walking paths of anything that might make someone trip, such as rocks or tools. Regularly check to see if handrails are loose or broken. Make sure that both sides of any steps have handrails. Any raised decks and porches should have guardrails on the edges. Have any leaves, snow, or ice cleared regularly. Use sand or salt on walking paths during winter. Clean up any spills in your garage right away. This includes oil or grease spills. What can I do in the bathroom? Use night lights. Install grab bars by the toilet and in the tub and shower. Do not use towel bars as grab bars. Use non-skid mats or decals in the tub or shower. If you need to sit down in the shower, use a plastic, non-slip stool. Keep the floor dry. Clean up any water that spills on the floor as soon as it happens. Remove soap buildup in the tub or shower regularly. Attach bath mats securely with double-sided non-slip rug tape. Do not have throw rugs and other things on the floor that can make you trip. What can I do in the bedroom? Use night  lights. Make sure that you have a light by your bed that is easy to reach. Do not use any sheets or blankets that are too big for your bed. They should not hang down onto the floor. Have a firm chair that has side arms. You can use this for support while you get dressed. Do not have throw rugs and other things on the floor that can make you trip. What can I do in the kitchen? Clean up any spills right away. Avoid walking on wet floors. Keep items that you use a lot in easy-to-reach places. If you need to reach something above you, use a strong step stool that has a grab bar. Keep electrical cords out of the way. Do not use floor polish or wax that makes floors slippery. If you must use wax, use non-skid floor wax. Do not have throw rugs and other things on the floor that can make you trip. What can I do with my stairs? Do not leave any items on the stairs. Make sure that there are handrails on both sides of the stairs and use them. Fix handrails that are broken or loose. Make sure that handrails are as long as the stairways. Check any carpeting to make sure that it is firmly attached to the stairs. Fix any carpet that is loose or worn. Avoid having throw rugs at the top or bottom of the stairs. If you do  have throw rugs, attach them to the floor with carpet tape. Make sure that you have a light switch at the top of the stairs and the bottom of the stairs. If you do not have them, ask someone to add them for you. What else can I do to help prevent falls? Wear shoes that: Do not have high heels. Have rubber bottoms. Are comfortable and fit you well. Are closed at the toe. Do not wear sandals. If you use a stepladder: Make sure that it is fully opened. Do not climb a closed stepladder. Make sure that both sides of the stepladder are locked into place. Ask someone to hold it for you, if possible. Clearly mark and make sure that you can see: Any grab bars or handrails. First and last  steps. Where the edge of each step is. Use tools that help you move around (mobility aids) if they are needed. These include: Canes. Walkers. Scooters. Crutches. Turn on the lights when you go into a dark area. Replace any light bulbs as soon as they burn out. Set up your furniture so you have a clear path. Avoid moving your furniture around. If any of your floors are uneven, fix them. If there are any pets around you, be aware of where they are. Review your medicines with your doctor. Some medicines can make you feel dizzy. This can increase your chance of falling. Ask your doctor what other things that you can do to help prevent falls. This information is not intended to replace advice given to you by your health care provider. Make sure you discuss any questions you have with your health care provider. Document Released: 03/03/2009 Document Revised: 10/13/2015 Document Reviewed: 06/11/2014 Elsevier Interactive Patient Education  2017 [employer].

## 2021-07-12 NOTE — Progress Notes (Signed)
Subjective:   Sharon York is a 76 y.o. female who presents for Medicare Annual (Subsequent) preventive examination.  Virtual Visit via Telephone Note  I connected with  Sharon York on 07/12/21 at  3:20 PM EST by telephone and verified that I am speaking with the correct person using two identifiers.  Location: Patient: home Provider: [facility] Persons participating in the virtual visit: [place]   I discussed the limitations, risks, security and privacy concerns of performing an evaluation and management service by telephone and the availability of in person appointments. The patient expressed understanding and agreed to proceed.  Interactive audio and video telecommunications were attempted between this nurse and patient, however failed, due to patient having technical difficulties OR patient did not have access to video capability.  We continued and completed visit with audio only.  Some vital signs may be absent or patient reported.   Clemetine Marker, LPN   Review of Systems     Cardiac Risk Factors include: advanced age (>19mn, >>59women);diabetes mellitus;dyslipidemia;hypertension;sedentary lifestyle;obesity (BMI >30kg/m2)     Objective:    There were no vitals filed for this visit. There is no height or weight on file to calculate BMI.  Advanced Directives 07/12/2021 11/23/2020 07/11/2020 12/15/2019 09/29/2019 06/29/2019 01/16/2019  Does Patient Have a Medical Advance Directive? Yes No Yes No Yes No Yes  Type of AParamedicof AWaverlyLiving will - HPamplicoLiving will - HBoutteLiving will - -  Does patient want to make changes to medical advance directive? - - - - - - No - Patient declined  Copy of [place] Chart? Yes - validated most recent copy scanned in chart (See row information) - Yes - validated most recent copy scanned in chart (See row information) - - - -  Would  patient like information on creating a medical advance directive? - - - - - - -    Current Medications (verified) Outpatient Encounter Medications as of 07/12/2021  Medication Sig   Accu-Chek Softclix Lancets lancets USE TO CHECK GLUCOSE UP TO 4 TIMES DAILY   alendronate (FOSAMAX) 70 MG tablet TAKE 1 TABLET BY MOUTH ONCE A WEEK. TAKE WITH A FULL GLASS OF WATER ON AN EMPTY STOMACH.   aspirin 81 MG chewable tablet Chew 1 tablet by mouth daily.   atorvastatin (LIPITOR) 10 MG tablet Take 1 tablet (10 mg total) by mouth daily.   baclofen (LIORESAL) 10 MG tablet Take 1 tablet (10 mg total) by mouth 2 (two) times daily.   blood glucose meter kit and supplies Dispense based on patient and insurance preference. Use up to four times daily as directed. (FOR ICD-10 E10.9, E11.9).   Calcium Carbonate-Vitamin D 500-125 MG-UNIT TABS Take 1 tablet by mouth daily.    glucose blood (ACCU-CHEK GUIDE) test strip Use as instructed   Glucose Blood Automatic (POGO AUTOMATIC TEST CARTRIDGES) TEST 1 Cartridge by In Vitro route 3 (three) times daily.   lisinopril (ZESTRIL) 2.5 MG tablet Take 1 tablet (2.5 mg total) by mouth daily.   metFORMIN (GLUCOPHAGE) 500 MG tablet TAKE 1 TABLET BY MOUTH ONCE DAILY WITH BREAKFAST. REPLACES 250MG DOSE   Multiple Vitamins-Minerals (MULTIVITAMIN ADULT PO) Take by mouth.   [DISCONTINUED] nystatin cream (MYCOSTATIN) Apply 1 application topically 2 (two) times daily. To skin under breast   [DISCONTINUED] promethazine-dextromethorphan (PROMETHAZINE-DM) 6.25-15 MG/5ML syrup Take 5 mLs by mouth 4 (four) times daily as needed for cough.   No  facility-administered encounter medications on file as of 07/12/2021.    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   Past Surgical History:  Procedure Laterality Date   [place]   COLONOSCOPY  2010   normal   Family History  Problem Relation Age of Onset   Diabetes Mother    Thyroid disease Mother    Breast cancer Maternal Aunt    Breast cancer Cousin        mat cousin   Prostate cancer Brother    Kidney failure Brother    Kidney cancer Son    Bladder Cancer Neg Hx    Social History   Socioeconomic History   Marital status: Widowed    Spouse name: Not on file   Number of children: 5   Years of education: Not on file   Highest education level: 8th grade  Occupational History   Occupation: Retired  Tobacco Use   Smoking status: Never   Smokeless tobacco: Never   Tobacco comments:    smoking cessation materials not required  Vaping Use   Vaping Use: Never used  Substance and Sexual Activity   Alcohol use: No    Alcohol/week: 0.0 standard drinks   Drug use: Never   Sexual activity: Not Currently  Other Topics Concern   Not on file  Social History Narrative   Pt lives with her daughter   Social Determinants of Health   Financial Resource Strain: Low Risk    Difficulty of Paying Living Expenses: Not hard at all  Food Insecurity: No Food Insecurity   Worried About Charity fundraiser in the Last Year: Never true   Arboriculturist in the Last Year: Never true  Transportation Needs: No Transportation Needs   Lack of Transportation (Medical): No   Lack of Transportation (Non-Medical): No  Physical Activity: Insufficiently Active   Days of Exercise per Week: 7 days   Minutes of Exercise per Session: 20 min  Stress: No Stress Concern Present   Feeling of Stress : Not at all  Social Connections: Moderately Integrated   Frequency of Communication with Friends and Family: More than three times a week   Frequency of Social  Gatherings with Friends and Family: More than three times a week   Attends Religious Services: More than 4 times per year   Active Member of [employer] or Organizations: Yes   Attends Archivist Meetings: More than 4 times per year   Marital Status: Widowed    Tobacco Counseling Counseling given: Not Answered Tobacco comments: smoking cessation materials not required   Clinical Intake:  Pre-visit preparation completed: Yes  Pain : No/denies pain     Nutritional Risks: None Diabetes: Yes CBG done?: No Did pt. bring in CBG monitor from home?: No  How often do you need to have someone help you when you read instructions, pamphlets, or other written materials from your doctor or pharmacy?: 3 - Sometimes (small print)  Nutrition Risk Assessment:  Has the patient had any N/V/D within the last 2 months?  No  Does the patient have any non-healing  wounds?  No  Has the patient had any unintentional weight loss or weight gain?  No   Diabetes:  Is the patient diabetic?  Yes  If diabetic, was a CBG obtained today?  No  Did the patient bring in their glucometer from home?  No  How often do you monitor your CBG's? Three times daily.   Financial Strains and Diabetes Management:  Are you having any financial strains with the device, your supplies or your medication? No .  Does the patient want to be seen by Chronic Care Management for management of their diabetes?  No  Would the patient like to be referred to a Nutritionist or for Diabetic Management?  No   Diabetic Exams:  Diabetic Eye Exam: Completed 03/01/21.  Diabetic Foot Exam: Completed 04/17/21.   Interpreter Needed?: No  Information entered by :: Clemetine Marker LPN   Activities of Daily Living In your present state of health, do you have any difficulty performing the following activities: 07/12/2021 05/26/2021  Hearing? [place]? N N  Difficulty concentrating or making decisions? [place]  Walking or climbing stairs?  Y Y  Dressing or bathing? Y Y  Doing errands, shopping? N N  Preparing Food and eating ? N -  Using the Toilet? N -  In the past six months, have you accidently leaked urine? Y -  Do you have problems with loss of bowel control? N -  Managing your Medications? N -  Managing your Finances? N -  Housekeeping or managing your Housekeeping? N -  Some recent data might be hidden    Patient Care Team: [name], MD as PCP - General (Internal Medicine) [facility] as Consulting Physician (Ophthalmology) Pleasant, Eppie Gibson, RN as [place] Management McGowan, Gordan Payment as Physician Assistant (Urology)  Indicate any recent Medical Services you may have received from other than [facility] providers in the past year (date may be approximate).     Assessment:   This is a routine wellness examination for [place].  Hearing/Vision screen Hearing Screening - Comments:: Hearing aids maintained by Jodie at [facility] Vision Screening - Comments:: Annual vision screenings done at [facility] Dr. Atilano Median  Dietary issues and exercise activities discussed: Current Exercise Habits: The patient does not participate in regular exercise at present, Exercise limited by: orthopedic condition(s)   Goals Addressed             This Visit's Progress    DIET - INCREASE WATER INTAKE   On track    Recommend to drink at least 6-8 8oz glasses of water per day.       Depression Screen PHQ 2/9 Scores 07/12/2021 05/26/2021 05/09/2021 04/17/2021 04/07/2021 02/02/2021 12/07/2020  PHQ - 2 Score 0 0 0 0 0 0 0  PHQ- 9 Score 0 0 0 '1 1 3 ' 0    Fall Risk Fall Risk  07/12/2021 07/06/2021 05/26/2021 05/09/2021 04/18/2021  Falls in the past year? 0 0 0 0 0  Comment - - - - -  Number falls in past yr: 0 0 0 0 0  Injury with Fall? 0 0 0 0 0  Risk Factor Category  - - - - -  Risk for fall due to : No Fall Risks Impaired balance/gait;Impaired mobility History of fall(s);Impaired  balance/gait No Fall Risks No Fall Risks  Risk for fall due to: Comment - - - - -  Follow up Falls prevention discussed Falls evaluation completed  Falls evaluation completed Falls evaluation completed Falls evaluation completed    FALL RISK PREVENTION PERTAINING TO THE HOME:  Any stairs in or around the home? Yes  If so, are there any without handrails? No  Home free of loose throw rugs in walkways, pet beds, electrical cords, etc? Yes  Adequate lighting in your home to reduce risk of falls? Yes   ASSISTIVE DEVICES UTILIZED TO PREVENT FALLS:  Life alert? No  Use of a cane, walker or w/c? Yes  Grab bars in the bathroom? Yes  Shower chair or bench in shower? Yes  Elevated toilet seat or a handicapped toilet? Yes   TIMED UP AND GO:  Was the test performed? No . Telephonic visit  Cognitive Function: Normal cognitive status assessed by direct observation by this Nurse Health Advisor. No abnormalities found.       6CIT Screen 01/12/2019 01/08/2018 01/07/2017  What Year? 0 points 0 points 0 points  What month? 0 points 0 points 0 points  What time? 0 points 0 points 0 points  Count back from 20 0 points 0 points 0 points  Months in reverse 0 points 0 points 0 points  Repeat phrase 2 points 0 points 0 points  Total Score 2 0 0    Immunizations Immunization History  Administered Date(s) Administered   Fluad Quad(high Dose 65+) 01/16/2019, 02/11/2020, 02/02/2021   Influenza, High Dose Seasonal PF 02/06/2017, 01/22/2018   Influenza,inj,Quad PF,6+ Mos 02/18/2015   PFIZER(Purple Top)SARS-COV-2 Vaccination 06/01/2019, 06/22/2019, 02/25/2020   Pneumococcal Conjugate-13 06/22/2016   Pneumococcal Polysaccharide-23 06/11/2017   Tdap 07/10/2018   Zoster Recombinat (Shingrix) 01/30/2021, 04/05/2021    TDAP status: Up to date  Flu Vaccine status: Up to date  Pneumococcal vaccine status: Up to date  Covid-19 vaccine status: Completed vaccines  Qualifies for Shingles Vaccine? Yes    Zostavax completed No   Shingrix Completed?: Yes  Screening Tests Health Maintenance  Topic Date Due   COVID-19 Vaccine (4 - Booster for Pfizer series) 04/21/2020   HEMOGLOBIN A1C  10/15/2021   OPHTHALMOLOGY EXAM  03/01/2022   MAMMOGRAM  03/14/2022   FOOT EXAM  04/17/2022   TETANUS/TDAP  07/10/2028   Pneumonia Vaccine 41+ Years old  Completed   INFLUENZA VACCINE  Completed   DEXA SCAN  Completed   Hepatitis C Screening  Completed   Zoster Vaccines- Shingrix  Completed   HPV VACCINES  Aged Out   COLONOSCOPY (Pts 45-13yr Insurance coverage will need to be confirmed)  Discontinued    Health Maintenance  Health Maintenance Due  Topic Date Due   COVID-19 Vaccine (4 - Booster for [place]) 04/21/2020    Colorectal cancer screening: No longer required.   Mammogram status: Completed 03/14/21. Repeat every year  Bone Density status: Completed 08/04/20. Results reflect: Bone density results: OSTEOPENIA. Repeat every 2 years.  Lung Cancer Screening: (Low Dose CT Chest recommended if Age 78-80years, 30 pack-year currently smoking OR have quit w/in 15years.) does not qualify.   Additional Screening:  Hepatitis C Screening: does qualify; Completed 06/11/17  Vision Screening: Recommended annual ophthalmology exams for early detection of glaucoma and other disorders of the eye. Is the patient up to date with their annual eye exam?  Yes  Who is the provider or what is the name of the office in which the patient attends annual eye exams? Dr. CAtilano Median   Dental Screening: Recommended annual dental exams for proper oral hygiene  Community Resource Referral / Chronic Care Management: CRR required this  visit?  No   CCM required this visit?  No      Plan:     I have personally reviewed and noted the following in the patients chart:   Medical and social history Use of alcohol, tobacco or illicit drugs  Current medications and supplements including opioid prescriptions.   Functional ability and status Nutritional status Physical activity Advanced directives List of other physicians Hospitalizations, surgeries, and ER visits in previous 12 months Vitals Screenings to include cognitive, depression, and falls Referrals and appointments  In addition, I have reviewed and discussed with patient certain preventive protocols, quality metrics, and best practice recommendations. A written personalized care plan for preventive services as well as general preventive health recommendations were provided to patient.     Clemetine Marker, LPN   [id number]   Nurse Notes: none

## 2021-07-14 NOTE — Progress Notes (Cosign Needed)
07/17/21 10:12 AM   Sharon York 04-19-1946 [id number]  Referring provider:  [name], MD [address] [place],  [place] [address] Chief Complaint  Patient presents with   Follow-up   Urinary Frequency   PTNS    Urological history  History of UTIs - Risk factors: age, vaginal atrophy and incontinence.    2. History of high risk hematuria  - Non-smoker.  CTU in 07/2017 noted the adrenal glands and kidneys are unremarkable. No renal, ureteral or bladder calculi or mass -   Cystoscopy in 07/2017 with Dr. Matilde Sprang - NED.  She denies any gross hematuria.  UA 11/2019 was negative for micro heme  3. Urinary frequency - Failed OAB medication - Managed with PTNS  - PTNS today #23 of 45   HPI: Sharon York is a 76 y.o.female who presents today for an office visit with PTNS #23 of 45.   She reports that she feels like the injection are helping with her symptoms. She is engaging in toilet mapping and getting up 0-3 times nightly. She wears pads and they are dry more than 50% of the time.    Patient denies any modifying or aggravating factors.  Patient denies any gross hematuria, dysuria or suprapubic/flank pain.  Patient denies any fevers, chills, nausea or vomiting.    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          Medication List        Accurate as of July 17, 2021 10:12 AM. If you have any questions, ask your nurse or doctor.          Accu-Chek Guide test strip Generic drug: glucose blood Use as instructed   POGO Automatic Test Cartridges Test Generic drug: Glucose Blood Automatic 1 Cartridge by In Vitro route 3 (three) times daily.   Accu-Chek Softclix Lancets lancets USE TO CHECK GLUCOSE UP TO 4 TIMES DAILY   alendronate 70 MG tablet Commonly known as: FOSAMAX TAKE 1 TABLET BY MOUTH ONCE A WEEK. TAKE WITH A FULL GLASS OF WATER ON AN EMPTY STOMACH.   aspirin 81 MG chewable tablet Chew 1 tablet by mouth daily.   atorvastatin 10 MG tablet Commonly known as: LIPITOR Take 1 tablet (10 mg total) by mouth daily.   baclofen 10 MG tablet Commonly known as: LIORESAL Take 1 tablet (10 mg total) by mouth 2 (two) times daily.   blood glucose meter kit and supplies Dispense based on patient and insurance preference. Use up to four times daily as directed. (FOR ICD-10 E10.9, E11.9).   Calcium Carbonate-Vitamin D 500-125 MG-UNIT Tabs Take 1 tablet by mouth daily.   lisinopril 2.5 MG tablet Commonly known as: Zestril Take 1 tablet (2.5 mg total) by mouth daily.  metFORMIN 500 MG tablet Commonly known as: GLUCOPHAGE TAKE 1 TABLET BY MOUTH ONCE DAILY WITH BREAKFAST. REPLACES 250MG DOSE   MULTIVITAMIN ADULT PO Take by mouth.        Allergies:  Allergies  Allergen Reactions   Nitrofurantoin Nausea Only   Iodinated Contrast Media Hives and Cough    Patient developed a hive on her left lip after injection as well as some coughing.     Family History: Family History  Problem Relation Age of Onset   Diabetes Mother    Thyroid disease Mother    Breast cancer Maternal Aunt    Breast cancer Cousin        mat cousin   Prostate cancer Brother    Kidney failure Brother    Kidney cancer Son    Bladder Cancer Neg Hx     Social History:  reports that she has never  smoked. She has never been exposed to tobacco smoke. She has never used smokeless tobacco. She reports that she does not drink alcohol and does not use drugs.   Physical Exam: BP (!) 149/85    Pulse 80    Ht '4\' 10"'  (1.473 m)    Wt 160 lb (72.6 kg)    BMI 33.44 kg/m   Constitutional:  Alert and oriented, No acute distress. HEENT: [place] AT, moist mucus membranes.  Trachea midline, no masses. Cardiovascular: No clubbing, cyanosis, or edema. Respiratory: Normal respiratory effort, no increased work of breathing. GI: Abdomen is soft, nontender, nondistended, no abdominal masses GU: No CVA tenderness Lymph: No cervical or inguinal lymphadenopathy. Skin: No rashes, bruises or suspicious lesions. Neurologic: Grossly intact, no focal deficits, moving all 4 extremities. Psychiatric: Normal mood and affect.  Laboratory Data: Lab Results  Component Value Date   CREATININE 0.68 12/07/2020   Lab Results  Component Value Date   HGBA1C 6.2 (A) 04/17/2021    Ref Range & Units 5 mo ago  Sodium 135 - 145 mmol/L 141   Potassium 3.4 - 4.8 mmol/L 3.7   Chloride 98 - 107 mmol/L 103   CO2 20.0 - 31.0 mmol/L 30.1   Anion Gap 5 - 14 mmol/L 8   BUN 9 - 23 mg/dL 12   Creatinine 0.60 - 0.80 mg/dL 0.65   BUN/Creatinine Ratio  18   eGFR CKD-EPI (2021) Female >=60 mL/min/1.44m >90   Comment: eGFR calculated with CKD-EPI 2021 equation in accordance with [employer] Insuranceand [employer] Nephrology Task Force recommendations.  Glucose 70 - 179 mg/dL 119   Calcium 8.7 - 10.4 mg/dL 9.9   Albumin 3.4 - 5.0 g/dL 3.6   Total Protein 5.7 - 8.2 g/dL 7.4   Total Bilirubin 0.3 - 1.2 mg/dL 0.4   AST <=34 U/L 18   ALT 10 - 49 U/L 9 Low    Alkaline Phosphatase 46 - 116 U/L 46   Resulting Agency  [facility]  Specimen Collected: 01/24/21 17:41 Last Resulted: 01/24/21 18:17  Received From: [facility] Result Received: 04/07/21 08:42  I have reviewed the labs.   Pertinent  Imaging: Results for orders placed or performed in visit on 07/17/21  BLADDER SCAN AMB NON-IMAGING  Result Value Ref Range   Scan Result 460m    Assessment & Plan:   Urinary frequency  - PTNS #23 of 45 today  - she has had symptomatic relief  - she is emptying adequately today  - BLADDER SCAN AMB NON-IMAGING  No follow-ups on file.  [place] [address] [place], [place] [address] [phone]  I,Kailey Littlejohn,acting as a scribe for [facility], PA-C.,have documented all relevant documentation on the behalf of [name] MCGOWAN, PA-C,as directed by  [facility], PA-C while in the presence of [place], PA-C.

## 2021-07-17 ENCOUNTER — Ambulatory Visit (INDEPENDENT_AMBULATORY_CARE_PROVIDER_SITE_OTHER): Payer: Medicare Other | Admitting: Urology

## 2021-07-17 ENCOUNTER — Other Ambulatory Visit: Payer: Self-pay

## 2021-07-17 ENCOUNTER — Encounter: Payer: Self-pay | Admitting: Urology

## 2021-07-17 VITALS — BP 149/85 | HR 80 | Ht <= 58 in | Wt 160.0 lb

## 2021-07-17 DIAGNOSIS — R350 Frequency of micturition: Secondary | ICD-10-CM

## 2021-07-17 LAB — BLADDER SCAN AMB NON-IMAGING

## 2021-07-17 NOTE — Progress Notes (Signed)
Patient ID: Sharon York, female   DOB: 07/02/1945, 76 y.o.   MRN: [id number] PTNS  Session # 23 of 45  Health & Social Factors: no change Caffeine: 0 Alcohol: 0 Daytime voids #per day: 3-4 Night-time voids #per night: 2 Urgency: mild Incontinence Episodes #per day: 0 Ankle used: right Treatment Setting: 5 Feeling/ Response: both Comments: pt tolerated well  Performed By: Mervin Hack, CMA  Follow Up: for # 24 of 45

## 2021-08-11 NOTE — Progress Notes (Signed)
Error

## 2021-08-14 ENCOUNTER — Ambulatory Visit (INDEPENDENT_AMBULATORY_CARE_PROVIDER_SITE_OTHER): Payer: Medicare Other | Admitting: Urology

## 2021-08-14 ENCOUNTER — Other Ambulatory Visit: Payer: Self-pay

## 2021-08-14 DIAGNOSIS — R350 Frequency of micturition: Secondary | ICD-10-CM

## 2021-08-14 NOTE — Progress Notes (Signed)
Patient ID: Sharon York, female   DOB: April 28, 1946, 76 y.o.   MRN: [id number] ?PTNS ? ?Session # 24 of 45 ? ?Health & Social Factors: no change ?Caffeine: 0 ?Alcohol: 0 ?Daytime voids #per day: 3-4 ?Night-time voids #per night: 2 ?Urgency: mild ?Incontinence Episodes #per day: 0 ?Ankle used: right ?Treatment Setting: 7 ?Feeling/ Response: sensory ?Comments: pt tolerated well ? ?Performed By: Mervin Hack, CMA ? ?Follow Up: for #25 of 45 ? ?

## 2021-08-16 ENCOUNTER — Encounter: Payer: Self-pay | Admitting: Internal Medicine

## 2021-08-16 ENCOUNTER — Telehealth: Payer: Self-pay

## 2021-08-16 ENCOUNTER — Ambulatory Visit (INDEPENDENT_AMBULATORY_CARE_PROVIDER_SITE_OTHER): Payer: Medicare Other | Admitting: Internal Medicine

## 2021-08-16 ENCOUNTER — Telehealth: Payer: Self-pay | Admitting: Internal Medicine

## 2021-08-16 ENCOUNTER — Other Ambulatory Visit: Payer: Self-pay

## 2021-08-16 VITALS — BP 124/78 | HR 78 | Ht <= 58 in | Wt 157.0 lb

## 2021-08-16 DIAGNOSIS — I69354 Hemiplegia and hemiparesis following cerebral infarction affecting left non-dominant side: Secondary | ICD-10-CM | POA: Diagnosis not present

## 2021-08-16 DIAGNOSIS — E118 Type 2 diabetes mellitus with unspecified complications: Secondary | ICD-10-CM | POA: Diagnosis not present

## 2021-08-16 MED ORDER — FREESTYLE LIBRE 3 SENSOR MISC: 1.0000 | 0 refills | Status: DC

## 2021-08-16 MED ORDER — POGO AUTOMATIC TEST CARTRIDGES VI TEST: 1.0000 | Freq: Three times a day (TID) | 2 refills | Status: DC

## 2021-08-16 MED ORDER — FREESTYLE LIBRE 14 DAY READER DEVI: 1.0000 | Freq: Every day | 0 refills | Status: DC

## 2021-08-16 NOTE — Telephone Encounter (Signed)
PA completed waiting on insurance approval. ? ?Key: [id number] ? ?KP ? ?

## 2021-08-16 NOTE — Telephone Encounter (Signed)
Called pt left VM to call back. ? ?Refill sent. ? ?KP

## 2021-08-16 NOTE — Telephone Encounter (Signed)
Pt was to call back and let the dr know the glucometer she has. ?Pt has a Pogo Blood Glucose Monitoring System. ?Pt needs a Rx for the cartridges. ?Pt was informed by pharmacy if she can get a Rx, it will be cheaper. ? ?Pt needs at 50 cartridges that will last her a month. ? ?[id number] - [place], [place] - [address] [address] ?

## 2021-08-16 NOTE — Progress Notes (Signed)
? ? ?Date:  08/16/2021  ? ?Name:  Sharon York   DOB:  23-Jul-1945   MRN:  [id number] ? ? ?Chief Complaint: Depression ? ?Diabetes ?She presents for her follow-up diabetic visit. She has type 2 diabetes mellitus. Her disease course has been stable. Pertinent negatives for hypoglycemia include no headaches or tremors. There are no diabetic associated symptoms. Pertinent negatives for diabetes include no chest pain, no fatigue, no polydipsia and no polyuria. Current diabetic treatment includes oral agent (monotherapy) (metformin). Her breakfast blood glucose is taken between 7-8 am. Her breakfast blood glucose range is generally 130-140 mg/dl.  ? ?Lab Results  ?Component Value Date  ? NA 140 12/07/2020  ? K 4.7 12/07/2020  ? CO2 27 12/07/2020  ? GLUCOSE 102 (H) 12/07/2020  ? BUN 9 12/07/2020  ? CREATININE 0.68 12/07/2020  ? CALCIUM 9.9 12/07/2020  ? EGFR 90 12/07/2020  ? GFRNONAA 79 12/03/2019  ? ?Lab Results  ?Component Value Date  ? CHOL 172 12/07/2020  ? HDL 71 12/07/2020  ? [place] 86 12/07/2020  ? TRIG 81 12/07/2020  ? CHOLHDL 2.4 12/07/2020  ? ?Lab Results  ?Component Value Date  ? TSH 3.620 12/07/2020  ? ?Lab Results  ?Component Value Date  ? HGBA1C 6.2 (A) 04/17/2021  ? ?Lab Results  ?Component Value Date  ? WBC 6.0 12/07/2020  ? HGB 12.4 12/07/2020  ? HCT 39.2 12/07/2020  ? MCV 73 (L) 12/07/2020  ? PLT 300 12/07/2020  ? ?Lab Results  ?Component Value Date  ? ALT 15 12/07/2020  ? AST 21 12/07/2020  ? ALKPHOS 52 12/07/2020  ? BILITOT 0.3 12/07/2020  ? ?No results found for: 25OHVITD2, [place], VD25OH  ? ?Review of Systems  ?Constitutional:  Negative for appetite change, fatigue, fever and unexpected weight change.  ?HENT:  Negative for tinnitus and trouble swallowing.   ?Eyes:  Negative for visual disturbance.  ?Respiratory:  Negative for cough, chest tightness and shortness of breath.   ?Cardiovascular:  Negative for chest pain, palpitations and leg swelling.  ?Gastrointestinal:  Negative for abdominal pain.   ?Endocrine: Negative for polydipsia and polyuria.  ?Genitourinary:  Negative for dysuria and hematuria.  ?Musculoskeletal:  Negative for arthralgias.  ?Neurological:  Negative for tremors, numbness and headaches.  ?Psychiatric/Behavioral:  Negative for dysphoric mood.   ? ?Patient Active Problem List  ? Diagnosis Date Noted  ? Microalbuminuria due to type 2 diabetes mellitus ([place]) 12/07/2019  ? Type II diabetes mellitus with complication ([place]) 82/80/0349  ? Right shoulder pain 03/31/2018  ? Hyperlipidemia associated with type 2 diabetes mellitus ([place]) 06/12/2017  ? Tinea corporis 03/17/2015  ? Abnormal gait 12/15/2014  ? Altered bowel function 12/15/2014  ? Hemiparesis affecting left side as late effect of cerebrovascular accident (CVA) ([place]) 12/15/2014  ? Obstructive sleep apnea of adult 12/15/2014  ? Arthritis of shoulder region, degenerative 12/15/2014  ? OP (osteoporosis) 12/15/2014  ? Allergic rhinitis, seasonal 12/15/2014  ? Mixed incontinence 03/11/2013  ? ? ?Allergies  ?Allergen Reactions  ? Nitrofurantoin Nausea Only  ? Iodinated Contrast Media Hives and Cough  ?  Patient developed a hive on her left lip after injection as well as some coughing.   ? ? ?Past Surgical History:  ?Procedure Laterality Date  ? [place]  ? COLONOSCOPY  2010  ? normal  ? ? ?Social History  ? ?Tobacco Use  ? Smoking status: Never  ?  Passive exposure: Never  ? Smokeless tobacco: Never  ? Tobacco comments:  ?  smoking cessation materials not required  ?Vaping Use  ? Vaping Use: Never used  ?Substance Use Topics  ? Alcohol use: No  ?  Alcohol/week: 0.0 standard drinks  ? Drug use: Never  ? ? ? ?Medication list has been reviewed and updated. ? ?Current Meds  ?Medication Sig  ? Accu-Chek Softclix Lancets lancets USE TO CHECK GLUCOSE UP TO 4 TIMES DAILY  ? alendronate (FOSAMAX) 70 MG tablet TAKE 1 TABLET BY MOUTH ONCE A WEEK. TAKE WITH A FULL GLASS OF WATER ON AN EMPTY STOMACH.  ? aspirin 81 MG chewable tablet Chew 1  tablet by mouth daily.  ? atorvastatin (LIPITOR) 10 MG tablet Take 1 tablet (10 mg total) by mouth daily.  ? baclofen (LIORESAL) 10 MG tablet Take 1 tablet (10 mg total) by mouth 2 (two) times daily.  ? blood glucose meter kit and supplies Dispense based on patient and insurance preference. Use up to four times daily as directed. (FOR ICD-10 E10.9, E11.9).  ? Calcium Carbonate-Vitamin D 500-125 MG-UNIT TABS Take 1 tablet by mouth daily.   ? glucose blood (ACCU-CHEK GUIDE) test strip Use as instructed  ? Glucose Blood Automatic (POGO AUTOMATIC TEST CARTRIDGES) TEST 1 Cartridge by In Vitro route 3 (three) times daily.  ? lisinopril (ZESTRIL) 2.5 MG tablet Take 1 tablet (2.5 mg total) by mouth daily.  ? metFORMIN (GLUCOPHAGE) 500 MG tablet TAKE 1 TABLET BY MOUTH ONCE DAILY WITH BREAKFAST. REPLACES 250MG DOSE  ? Multiple Vitamins-Minerals (MULTIVITAMIN ADULT PO) Take by mouth.  ? ? ? ?  08/16/2021  ?  9:04 AM 05/26/2021  ?  3:12 PM 05/09/2021  ?  2:17 PM 04/17/2021  ?  9:41 AM  ?GAD 7 : Generalized Anxiety Score  ?Nervous, Anxious, on Edge 0 0 0 0  ?Control/stop worrying 0 0 0 0  ?Worry too much - different things 0 0 0 0  ?Trouble relaxing 0 0 0 0  ?Restless 0 0 0 0  ?Easily annoyed or irritable 0 0 0 0  ?Afraid - awful might happen 0 0 0 0  ?Total GAD 7 Score 0 0 0 0  ?Anxiety Difficulty Not difficult at all Not difficult at all Not difficult at all Not difficult at all  ? ? ? ?  08/16/2021  ?  9:04 AM  ?Depression screen PHQ 2/9  ?Decreased Interest 0  ?Down, Depressed, Hopeless 0  ?PHQ - 2 Score 0  ?Altered sleeping 0  ?Tired, decreased energy 0  ?Change in appetite 0  ?Feeling bad or failure about yourself  0  ?Trouble concentrating 0  ?Moving slowly or fidgety/restless 0  ?Suicidal thoughts 0  ?PHQ-9 Score 0  ?Difficult doing work/chores Not difficult at all  ? ? ?BP Readings from Last 3 Encounters:  ?08/16/21 124/78  ?07/17/21 (!) 149/85  ?05/26/21 140/80  ? ? ?Physical Exam ?Vitals and nursing note reviewed.   ?Constitutional:   ?   General: She is not in acute distress. ?   Appearance: She is well-developed.  ?HENT:  ?   Head: Normocephalic and atraumatic.  ?Cardiovascular:  ?   Rate and Rhythm: Normal rate and regular rhythm.  ?   Pulses: Normal pulses.  ?   Heart sounds: No murmur heard. ?Pulmonary:  ?   Effort: Pulmonary effort is normal. No respiratory distress.  ?Musculoskeletal:  ?   Cervical back: Normal range of motion.  ?   Right lower leg: No edema.  ?   Left lower leg: No edema.  ?  Skin: ?   General: Skin is warm and dry.  ?   Findings: No rash.  ?Neurological:  ?   Mental Status: She is alert and oriented to person, place, and time. Mental status is at baseline.  ?   Gait: Gait abnormal.  ?   Comments: Stable left foot drop and dense LUE paresis  ?Psychiatric:     ?   Mood and Affect: Mood normal.     ?   Behavior: Behavior normal.  ? ? ?Wt Readings from Last 3 Encounters:  ?08/16/21 157 lb (71.2 kg)  ?07/17/21 160 lb (72.6 kg)  ?05/26/21 154 lb (69.9 kg)  ? ? ?BP 124/78   Pulse 78   Ht 4' 10" (1.473 m)   Wt 157 lb (71.2 kg)   SpO2 98%   BMI 32.81 kg/m?  ? ?Assessment and Plan: ?1. Type II diabetes mellitus with complication ([place]) ?Clinically stable by exam and report without s/s of hypoglycemia. ?DM complicated by hypertension and dyslipidemia. ?Tolerating medications well without side effects or other concerns. ?She wants to try to get [facility].  Otherwise she wants to try another meter that contains the lancets and strips in one device. ?- Hemoglobin A1c ?- Continuous Blood Gluc Sensor (FREESTYLE LIBRE 3 SENSOR) MISC; 1 each by Does not apply route every 14 (fourteen) days. Place 1 sensor on the skin every 14 days. Use to check glucose continuously  Dispense: 2 each; Refill: 0 ?- Continuous Blood Gluc Receiver (FREESTYLE LIBRE 14 DAY READER) DEVI; 1 each by Does not apply route daily at 6 (six) AM.  Dispense: 1 each; Refill: 0 ? ?2. Hemiparesis affecting left side as late effect of  cerebrovascular accident (CVA) ([place]) ?Stable and unchanged ?Continue posterior splint for left foot drop ?Ambulates slowly but steadily with a cane. ? ? ?Partially dictated using Editor, commissioning. Any errors are unintentio

## 2021-08-17 LAB — MICROALBUMIN / CREATININE URINE RATIO
Creatinine, Urine: 54.2 mg/dL
Microalb/Creat Ratio: 13 mg/g creat (ref 0–29)
Microalbumin, Urine: 7.3 ug/mL

## 2021-08-17 LAB — HEMOGLOBIN A1C
Est. average glucose Bld gHb Est-mCnc: 148 mg/dL
Hgb A1c MFr Bld: 6.8 % — ABNORMAL HIGH (ref 4.8–5.6)

## 2021-08-18 NOTE — Telephone Encounter (Signed)
Denied by insurance.  KP 

## 2021-09-11 ENCOUNTER — Ambulatory Visit (INDEPENDENT_AMBULATORY_CARE_PROVIDER_SITE_OTHER): Payer: Medicare Other | Admitting: Urology

## 2021-09-11 DIAGNOSIS — R350 Frequency of micturition: Secondary | ICD-10-CM | POA: Diagnosis not present

## 2021-09-11 NOTE — Progress Notes (Signed)
Patient ID: Sharon York, female   DOB: 1945/10/18, 76 y.o.   MRN: [id number] ?PTNS ? ?Session # 25 of 45 ? ?Health & Social Factors: no change ?Caffeine: 0 ?Alcohol: 0 ?Daytime voids #per day: 3 ?Night-time voids #per night: 2 ?Urgency: mild ?Incontinence Episodes #per day: 0 ?Ankle used: right ?Treatment Setting: 0 ?Feeling/ Response: both ?Comments: pt tolerated well ? ?Performed By: Mervin Hack, CMA ? ?Follow Up: 1 mth for #26 of 45 ? ?

## 2021-09-21 ENCOUNTER — Other Ambulatory Visit: Payer: Self-pay | Admitting: Internal Medicine

## 2021-09-21 DIAGNOSIS — E118 Type 2 diabetes mellitus with unspecified complications: Secondary | ICD-10-CM

## 2021-09-21 NOTE — Telephone Encounter (Signed)
Requested Prescriptions  ?Pending Prescriptions Disp Refills  ?? metFORMIN (GLUCOPHAGE) 500 MG tablet [Pharmacy Med Name: metFORMIN HCl 500 MG Oral Tablet] 90 tablet 0  ?  Sig: TAKE 1 TABLET BY MOUTH ONCE DAILY WITH BREAKFAST -  REPLACES  250  MG  DOSE  ?  ? Endocrinology:  Diabetes - Biguanides Failed - 09/21/2021 10:22 AM  ?  ?  Failed - B12 Level in normal range and within 720 days  ?  No results found for: VITAMINB12   ?  ?  Passed - Cr in normal range and within 360 days  ?  Creatinine, Ser  ?Date Value Ref Range Status  ?12/07/2020 0.68 0.57 - 1.00 mg/dL Final  ?   ?  ?  Passed - HBA1C is between 0 and 7.9 and within 180 days  ?  Hgb A1c MFr Bld  ?Date Value Ref Range Status  ?08/16/2021 6.8 (H) 4.8 - 5.6 % Final  ?  Comment:  ?           Prediabetes: 5.7 - 6.4 ?         Diabetes: >6.4 ?         Glycemic control for adults with diabetes: <7.0 ?  ?   ?  ?  Passed - eGFR in normal range and within 360 days  ?  GFR calc Af Amer  ?Date Value Ref Range Status  ?12/03/2019 91 >59 mL/min/1.73 Final  ?  Comment:  ?  **[facility] currently reports eGFR in compliance with the current** ?  recommendations of the [employer]. [facility] will ?  update reporting as new guidelines are published from the NKF-ASN ?  Task force. ?  ? ?GFR calc non Af Amer  ?Date Value Ref Range Status  ?12/03/2019 79 >59 mL/min/1.73 Final  ? ?eGFR  ?Date Value Ref Range Status  ?12/07/2020 90 >59 mL/min/1.73 Final  ?   ?  ?  Passed - Valid encounter within last 6 months  ?  Recent Outpatient Visits   ?      ? 1 month ago Type II diabetes mellitus with complication ([place])  ? [facility] [name], MD  ? 3 months ago Bronchitis  ? [facility] [name], MD  ? 4 months ago Viral URI with cough  ? [facility] [name], MD  ? 5 months ago Type II diabetes mellitus with complication [facility])  ? [facility] [name], MD  ? 5 months ago Acute cystitis without hematuria  ?  [facility] [name], MD  ?  ?  ?Future Appointments   ?        ? In 2 months [name], MD [facility]  ?  ? ?  ?  ?  Passed - CBC within normal limits and completed in the last 12 months  ?  WBC  ?Date Value Ref Range Status  ?12/07/2020 6.0 3.4 - 10.8 x10E3/uL Final  ?06/22/2017 9.6 3.6 - 11.0 K/uL Final  ? ?RBC  ?Date Value Ref Range Status  ?12/07/2020 5.39 (H) 3.77 - 5.28 x10E6/uL Final  ?01/22/2018 2 (A) 4.04 - 5.48 M/uL Final  ?06/22/2017 5.33 (H) 3.80 - 5.20 MIL/uL Final  ? ?Hemoglobin  ?Date Value Ref Range Status  ?12/07/2020 12.4 11.1 - 15.9 g/dL Final  ? ?Hematocrit  ?Date Value Ref Range Status  ?12/07/2020 39.2 34.0 - 46.6 % Final  ? ?MCHC  ?  Date Value Ref Range Status  ?12/07/2020 31.6 31.5 - 35.7 g/dL Final  ?06/22/2017 32.9 32.0 - 36.0 g/dL Final  ? ?MCH  ?Date Value Ref Range Status  ?12/07/2020 23.0 (L) 26.6 - 33.0 pg Final  ?06/22/2017 23.6 (L) 26.0 - 34.0 pg Final  ? ?MCV  ?Date Value Ref Range Status  ?12/07/2020 73 (L) 79 - 97 fL Final  ? ?No results found for: PLTCOUNTKUC, LABPLAT, [place] ?RDW  ?Date Value Ref Range Status  ?12/07/2020 16.2 (H) 11.7 - 15.4 % Final  ? ?  ?  ?  ? ?

## 2021-10-03 ENCOUNTER — Other Ambulatory Visit: Payer: Self-pay | Admitting: *Deleted

## 2021-10-03 NOTE — Patient Outreach (Signed)
[facility] ([facility] Care Management ?RN Health Coach Note ? ? ?10/03/2021 ?Name:  Sharon York MRN:  [id number] DOB:  05/09/1946 ? ?Summary: ?Fasting blood sugar 114. A1C 6.8 increased from 6.2. Patient is walking daily for exercise.  ? ?Recommendations/Changes made from today's visit: ?Continue exercise routine ?Monitor blood sugar ?Medication adherence ? ? ?Subjective: ?Sharon York is an 76 y.o. year old female who is a primary patient of Berglund, Laura H, MD. The care management team was consulted for assistance with care management and/or care coordination needs.   ? ?RN Health Coach completed Telephone Visit today.  ? ?Objective: ? ?Medications Reviewed Today   ? ? Reviewed by McAdoo, Chassidy N, CMA (Certified Medical Assistant) on 08/16/21 at 0859  Med List Status: <None>  ? ?Medication Order Taking? Sig Documenting Provider Last Dose Status Informant  ?Accu-Chek Softclix Lancets lancets [id number] Yes USE TO CHECK GLUCOSE UP TO 4 TIMES DAILY Berglund, Laura H, MD Taking Active   ?alendronate (FOSAMAX) 70 MG tablet [id number]  TAKE 1 TABLET BY MOUTH ONCE A WEEK. TAKE WITH A FULL GLASS OF WATER ON AN EMPTY STOMACH. Berglund, Laura H, MD  Active   ?aspirin 81 MG chewable tablet [id number]  Chew 1 tablet by mouth daily. [provider]  Active   ?         ?Med Note (SMITH, DESHANNON L   Mon Dec 07, 2019 10:57 AM)    ?atorvastatin (LIPITOR) 10 MG tablet [id number]  Take 1 tablet (10 mg total) by mouth daily. Berglund, Laura H, MD  Active   ?baclofen (LIORESAL) 10 MG tablet [id number]  Take 1 tablet (10 mg total) by mouth 2 (two) times daily. Berglund, Laura H, MD  Active   ?blood glucose meter kit and supplies [id number] Yes Dispense based on patient and insurance preference. Use up to four times daily as directed. (FOR ICD-10 E10.9, E11.9). Berglund, Laura H, MD Taking Active   ?Calcium Carbonate-Vitamin D 500-125 MG-UNIT TABS [id number]  Take 1 tablet by mouth daily.  [provider]   Active   ?         ?Med Note (SMITH, DESHANNON L   Mon Dec 07, 2019 10:57 AM)    ?glucose blood (ACCU-CHEK GUIDE) test strip [id number] Yes Use as instructed Berglund, Laura H, MD Taking Active   ?Glucose Blood Automatic (POGO AUTOMATIC TEST CARTRIDGES) TEST [id number] Yes 1 Cartridge by In Vitro route 3 (three) times daily. Berglund, Laura H, MD Taking Active   ?lisinopril (ZESTRIL) 2.5 MG tablet [id number]  Take 1 tablet (2.5 mg total) by mouth daily. Berglund, Laura H, MD  Active   ?metFORMIN (GLUCOPHAGE) 500 MG tablet [id number]  TAKE 1 TABLET BY MOUTH ONCE DAILY WITH BREAKFAST. REPLACES 250MG DOSE Berglund, Laura H, MD  Active   ?Multiple Vitamins-Minerals (MULTIVITAMIN ADULT PO) [id number]  Take by mouth. [provider]  Active Self  ? ?  ?  ? ?  ? ? ? ?SDOH:  (Social Determinants of Health) assessments and interventions performed:  ?SDOH Interventions   ? ?Flowsheet Row Most Recent Value  ?SDOH Interventions   ?Food Insecurity Interventions Intervention Not Indicated  ?Housing Interventions Intervention Not Indicated  ?Transportation Interventions Intervention Not Indicated  ? ?  ? ? ?Care Plan ? ?Review of patient past medical history, allergies, medications, health status, including review of consultants reports, laboratory and other test data, was performed as part of comprehensive evaluation for care management services.  ? ?Care Plan : RN Care Manager   Plan of Care  ?Updates made by [place] Clasby, Eppie Gibson, RN since 10/03/2021 12:00 AM  ?  ? ?Problem: Knowledge Deficit Related to Diabetes and Care Coordination Needs   ?Priority: High  ?  ? ?Long-Range Goal: Development Plan of Care for Management of Diabetes   ?Start Date: 04/18/2021  ?Expected End Date: 05/19/2022  ?Priority: High  ?Note:   ?Current Barriers:  ?Knowledge Deficits related to plan of care for management of DMII  ? ?RNCM Clinical Goal(s):  ?Patient will verbalize understanding of plan for management of DMII as evidenced by continuation of  monitoring blood sugars and adhering to diabetic diet  through collaboration with RN Care manager, provider, and care team.  ? ?Interventions: ?Inter-disciplinary care team collaboration (see longitudinal plan of care) ?Evaluation of current treatment plan related to  self management and patient's adherence to plan as established by provider ?RN discussed healthy eating ?RN discussed continuing exercise routine ?RN encouraged socializing ?RN encouraged continuing with progress of A1C 6.2 ? ?Patient Goals/Self-Care Activities: ?Take medications as prescribed   ?Attend all scheduled provider appointments ?Call pharmacy for medication refills 3-7 days in advance of running out of medications ?Attend church or other social activities ?Perform all self care activities independently  ?Perform IADL's (shopping, preparing meals, housekeeping, managing finances) independently ?Call provider office for new concerns or questions  ?call the Suicide and Crisis Lifeline: 988 if experiencing a Mental Health or [place]  ?schedule appointment with eye doctor ?check blood sugar at prescribed times: three times daily ?check feet daily for cuts, sores or redness ?enter blood sugar readings and medication or insulin into daily log ?take the blood sugar meter to all doctor visits ?trim toenails straight across ?drink 6 to 8 glasses of water each day ?manage portion size ?  ?[id number] Fasting blood sugar is 114. Her A1C increased from 6.2 to 6.8. She is walking 700 steps inside and 1500 on track daily. Per patient she is driving self to appointments. Patient is drinking ensure. Appetite is good. RN discussed healthy eating and exercise. ?  ?  ? ?Plan: Telephone follow up appointment with care management team member scheduled for:  December 26, 2021 ?The patient has been provided with contact information for the care management team and has been advised to call with any health related questions or concerns.  ? ?Johny Shock BSN RN ?[facility] ?[phone] ? ? ? ?  ?

## 2021-10-03 NOTE — Patient Instructions (Signed)
Visit Information ? ?Thank you for taking time to visit with me today. Please don't hesitate to contact me if I can be of assistance to you before our next scheduled telephone appointment. ? ?Following are the goals we discussed today:  ?Current Barriers:  ?Knowledge Deficits related to plan of care for management of DMII  ? ?RNCM Clinical Goal(s):  ?Patient will verbalize understanding of plan for management of DMII as evidenced by continuation of monitoring blood sugars and adhering to diabetic diet through collaboration with RN Care manager, provider, and care team.  ? ?Interventions: ?Inter-disciplinary care team collaboration (see longitudinal plan of care) ?Evaluation of current treatment plan related to  self management and patient's adherence to plan as established by provider ?RN discussed healthy eating ?RN discussed continuing exercise routine ?RN encouraged socializing ?RN encouraged continuing with progress of A1C 6.2 ? ?Patient Goals/Self-Care Activities: ?Take medications as prescribed   ?Attend all scheduled provider appointments ?Call pharmacy for medication refills 3-7 days in advance of running out of medications ?Attend church or other social activities ?Perform all self care activities independently  ?Perform IADL's (shopping, preparing meals, housekeeping, managing finances) independently ?Call provider office for new concerns or questions  ?call the Suicide and Crisis Lifeline: 988 if experiencing a Mental Health or [place]  ?schedule appointment with eye doctor ?check blood sugar at prescribed times: three times daily ?check feet daily for cuts, sores or redness ?enter blood sugar readings and medication or insulin into daily log ?take the blood sugar meter to all doctor visits ?trim toenails straight across ?drink 6 to 8 glasses of water each day ?manage portion size ?  ?[id number] Fasting blood sugar is 114. Her A1C increased from 6.2 to 6.8. She is walking 700 steps inside  and 1500 on track daily. Per patient she is driving self to appointments. Patient is drinking ensure. Appetite is good. RN discussed healthy eating and exercise. ? ?Our next appointment is by telephone on December 26, 2021 ? ?Please call Johny Shock RN [phone]  if you need to cancel or reschedule your appointment.  ? ?Please call the Suicide and Crisis Lifeline: 988 if you are experiencing a Mental Health or [place] or need someone to talk to. ? ?The patient verbalized understanding of instructions, educational materials, and care plan provided today and agreed to receive a mailed copy of patient instructions, educational materials, and care plan.  ? ?Telephone follow up appointment with care management team member scheduled for: ?The patient has been provided with contact information for the care management team and has been advised to call with any health related questions or concerns.  ? ?SIGNATURE ? ?Johny Shock BSN RN ?[facility] [facility] ?[phone] ? ? ?  ?

## 2021-10-09 ENCOUNTER — Ambulatory Visit: Payer: Medicare Other | Admitting: Urology

## 2021-10-30 ENCOUNTER — Ambulatory Visit (INDEPENDENT_AMBULATORY_CARE_PROVIDER_SITE_OTHER): Payer: Medicare Other | Admitting: Urology

## 2021-10-30 DIAGNOSIS — R350 Frequency of micturition: Secondary | ICD-10-CM | POA: Diagnosis not present

## 2021-10-30 NOTE — Progress Notes (Signed)
PTNS  Session # 26 of 45  Health & Social Factors: no change Caffeine: 0 Alcohol: 0 Daytime voids #per day: 3 Night-time voids #per night: 1 Urgency: mild Incontinence Episodes #per day: 0 Ankle used: right Treatment Setting: 1 Feeling/ Response: both Comments: pt tolerated well  Performed By: Lucia Bitter  Follow Up: 1 month

## 2021-11-02 ENCOUNTER — Other Ambulatory Visit: Payer: Self-pay | Admitting: Internal Medicine

## 2021-11-02 DIAGNOSIS — I69354 Hemiplegia and hemiparesis following cerebral infarction affecting left non-dominant side: Secondary | ICD-10-CM

## 2021-11-02 NOTE — Telephone Encounter (Signed)
Requested Prescriptions  Pending Prescriptions Disp Refills  . baclofen (LIORESAL) 10 MG tablet [Pharmacy Med Name: Baclofen 10 MG Oral Tablet] 180 tablet 0    Sig: Take 1 tablet by mouth twice daily     Analgesics:  Muscle Relaxants - baclofen Failed - 11/02/2021  5:00 PM      Failed - Cr in normal range and within 180 days    Creatinine, Ser  Date Value Ref Range Status  12/07/2020 0.68 0.57 - 1.00 mg/dL Final         Failed - eGFR is 30 or above and within 180 days    GFR calc Af Amer  Date Value Ref Range Status  12/03/2019 91 >59 mL/min/1.73 Final    Comment:    **[facility] currently reports eGFR in compliance with the current**   recommendations of the [employer]. [facility] will   update reporting as new guidelines are published from the NKF-ASN   Task force.    GFR calc non Af Amer  Date Value Ref Range Status  12/03/2019 79 >59 mL/min/1.73 Final   eGFR  Date Value Ref Range Status  12/07/2020 90 >59 mL/min/1.73 Final         Passed - Valid encounter within last 6 months    Recent Outpatient Visits          2 months ago Type II diabetes mellitus with complication [facility])   [facility] [name], MD   5 months ago [facility] [name], MD   5 months ago Viral URI with cough   [facility] [name], MD   6 months ago Type II diabetes mellitus with complication [facility])   [name], MD   6 months ago Acute cystitis without hematuria   [facility] [name], MD      Future Appointments            In 1 month [place], Gordan Payment [place]   In 1 month [place] Melia Jesse Sans, MD [facility]

## 2021-11-14 ENCOUNTER — Ambulatory Visit: Payer: Self-pay | Admitting: *Deleted

## 2021-11-14 NOTE — Telephone Encounter (Signed)
Summary: vitamin D   Pt is taking vitamin D and ran out and was getting some more but is not sure how many MGs she should be taking / please advise     Call to patient- patient states PCP advised her to take Vit D and she can't remember the amount. Patient advised most of the time recommended daily is 600-800IU- I will check with provider and find out what is recommended for for her. ( Unable to find note in chart) Reason for Disposition  [1] Caller has NON-URGENT medicine question about med that PCP prescribed AND [2] triager unable to answer question  Answer Assessment - Initial Assessment Questions 1. NAME of MEDICATION: "What medicine are you calling about?"     Vitamin D 2. QUESTION: "What is your question?" (e.g., double dose of medicine, side effect)     How much should she be taking 3. PRESCRIBING HCP: "Who prescribed it?" Reason: if prescribed by specialist, call should be referred to that group.     PCP  Protocols used: Medication Question Call-A-AH

## 2021-11-22 ENCOUNTER — Ambulatory Visit: Payer: Self-pay | Admitting: *Deleted

## 2021-11-22 NOTE — Telephone Encounter (Signed)
EDT  Summary: swelling in foot   Patient states her lt foot has been swollen x3d   Patient denies pain and states she had a stroke on her lt side   Please fu w/ patient           Chief Complaint: swollen left ankle Symptoms: just swollen, no pain Frequency: constant Pertinent Negatives: Patient denies fever, SOB, chest pain, calf pain Disposition: [] ED /[] Urgent Care (no appt availability in office) / [x] Appointment(In office/virtual)/ []  [facility]/ [] Home Care/ [] Refused Recommended Disposition /[] [facility]/ []  Follow-up with PCP Additional Notes: Pt has a physical coming up in a couple of weeks but there were only virtual appts open this week. She is scheduled for tomorrow morning. She has a smart phone but not on MyChart.  Reason for Disposition  [1] MODERATE pain (e.g., interferes with normal activities, limping) AND [2] present > 3 days  Answer Assessment - Initial Assessment Questions 1. LOCATION: "Which ankle is swollen?" "Where is the swelling?"     Left (had a stroke on left) 2. ONSET: "When did the swelling start?"     3 days 3. SIZE: "How large is the swelling?"     moderate 4. PAIN: "Is there any pain?" If Yes, ask: "How bad is it?" (Scale 1-10; or mild, moderate, severe)   - NONE (0): no pain.   - MILD (1-3): doesn't interfere with normal activities.    - MODERATE (4-7): interferes with normal activities (e.g., work or school) or awakens from sleep, limping.    - SEVERE (8-10): excruciating pain, unable to do any normal activities, unable to walk.      Can not Get shoe on 5. CAUSE: "What do you think caused the ankle swelling?"     Unsure, it has happened before and usually goes down 6. OTHER SYMPTOMS: "Do you have any other symptoms?" (e.g., fever, chest pain, difficulty breathing, calf pain)     no 7. PREGNANCY: "Is there any chance you are pregnant?" "When was your last menstrual period?"     no  Protocols used: Ankle  Swelling-A-AH

## 2021-11-23 ENCOUNTER — Encounter: Payer: Self-pay | Admitting: Internal Medicine

## 2021-11-23 ENCOUNTER — Ambulatory Visit (INDEPENDENT_AMBULATORY_CARE_PROVIDER_SITE_OTHER): Payer: Medicare Other | Admitting: Internal Medicine

## 2021-11-23 VITALS — BP 120/74 | HR 79 | Ht <= 58 in | Wt 154.0 lb

## 2021-11-23 DIAGNOSIS — R600 Localized edema: Secondary | ICD-10-CM | POA: Diagnosis not present

## 2021-11-23 DIAGNOSIS — E118 Type 2 diabetes mellitus with unspecified complications: Secondary | ICD-10-CM | POA: Diagnosis not present

## 2021-11-23 MED ORDER — LISINOPRIL 2.5 MG PO TABS: 2.5000 mg | ORAL_TABLET | Freq: Every day | ORAL | 3 refills | Status: DC

## 2021-11-23 NOTE — Progress Notes (Signed)
Date:  11/23/2021   Name:  Sharon York   DOB:  [birth year]   MRN:  [id number]   Chief Complaint: Edema (Swollen left ankle. No injury mechanism. )  Hypertension This is a chronic problem. The problem is controlled. Pertinent negatives include no chest pain, palpitations or shortness of breath.  Edema - she has noticed some swelling of her left foot and ankle recently.  It is not painful or uncomfortable.  It is improved in the AM.  She does like salty foods, esp popcorn.  She drinks plain water regularly.  She does not elevate her legs.    Lab Results  Component Value Date   NA 140 12/07/2020   K 4.7 12/07/2020   CO2 27 12/07/2020   GLUCOSE 102 (H) 12/07/2020   BUN 9 12/07/2020   CREATININE 0.68 12/07/2020   CALCIUM 9.9 12/07/2020   EGFR 90 12/07/2020   GFRNONAA 79 12/03/2019   Lab Results  Component Value Date   CHOL 172 12/07/2020   HDL 71 12/07/2020   LDLCALC 86 12/07/2020   TRIG 81 12/07/2020   CHOLHDL 2.4 12/07/2020   Lab Results  Component Value Date   TSH 3.620 12/07/2020   Lab Results  Component Value Date   HGBA1C 6.8 (H) 08/16/2021   Lab Results  Component Value Date   WBC 6.0 12/07/2020   HGB 12.4 12/07/2020   HCT 39.2 12/07/2020   MCV 73 (L) 12/07/2020   PLT 300 12/07/2020   Lab Results  Component Value Date   ALT 15 12/07/2020   AST 21 12/07/2020   ALKPHOS 52 12/07/2020   BILITOT 0.3 12/07/2020   No results found for: "25OHVITD2", "25OHVITD3", "VD25OH"   Review of Systems  Constitutional:  Negative for chills and fever.  HENT:  Negative for trouble swallowing.   Respiratory:  Negative for choking and shortness of breath.   Cardiovascular:  Positive for leg swelling. Negative for chest pain and palpitations.  Neurological:  Positive for weakness (left sided from previous CVA).  Psychiatric/Behavioral:  Negative for dysphoric mood and sleep disturbance. The patient is not nervous/anxious.     Patient Active Problem List   Diagnosis  Date Noted   Microalbuminuria due to type 2 diabetes mellitus ([place]) 12/07/2019   Type II diabetes mellitus with complication ([place]) 67/34/1937   Right shoulder pain 03/31/2018   Hyperlipidemia associated with type 2 diabetes mellitus ([place]) 06/12/2017   Tinea corporis 03/17/2015   Abnormal gait 12/15/2014   Altered bowel function 12/15/2014   Hemiparesis affecting left side as late effect of cerebrovascular accident (CVA) ([place]) 12/15/2014   Obstructive sleep apnea of adult 12/15/2014   Arthritis of shoulder region, 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     Past Surgical History:  Procedure Laterality Date   [place]   COLONOSCOPY  2010   normal    Social History   Tobacco Use   Smoking status: Never    Passive exposure: Never   Smokeless tobacco: Never   Tobacco comments:    smoking cessation materials not required  Vaping Use   Vaping Use: Never used  Substance Use Topics   Alcohol use: No    Alcohol/week:  0.0 standard drinks of alcohol   Drug use: Never     Medication list has been reviewed and updated.  Current Meds  Medication Sig   Accu-Chek Softclix Lancets lancets USE TO CHECK GLUCOSE UP TO 4 TIMES DAILY   alendronate (FOSAMAX) 70 MG tablet TAKE 1 TABLET BY MOUTH ONCE A WEEK. TAKE WITH A FULL GLASS OF WATER ON AN EMPTY STOMACH.   aspirin 81 MG chewable tablet Chew 1 tablet by mouth daily.   atorvastatin (LIPITOR) 10 MG tablet Take 1 tablet (10 mg total) by mouth daily.   baclofen (LIORESAL) 10 MG tablet Take 1 tablet by mouth twice daily   blood glucose meter kit and supplies Dispense based on patient and insurance preference. Use up to four times daily as directed.  (FOR ICD-10 E10.9, E11.9).   Calcium Carbonate-Vitamin D 500-125 MG-UNIT TABS Take 1 tablet by mouth daily.    Continuous Blood Gluc Receiver (FREESTYLE LIBRE 14 DAY READER) DEVI 1 each by Does not apply route daily at 6 (six) AM.   Continuous Blood Gluc Sensor (FREESTYLE LIBRE 3 SENSOR) MISC 1 each by Does not apply route every 14 (fourteen) days. Place 1 sensor on the skin every 14 days. Use to check glucose continuously   Glucose Blood Automatic (POGO AUTOMATIC TEST CARTRIDGES) TEST 1 Cartridge by In Vitro route 3 (three) times daily.   lisinopril (ZESTRIL) 2.5 MG tablet Take 1 tablet (2.5 mg total) by mouth daily.   metFORMIN (GLUCOPHAGE) 500 MG tablet TAKE 1 TABLET BY MOUTH ONCE DAILY WITH BREAKFAST -  REPLACES  250  MG  DOSE   Multiple Vitamins-Minerals (MULTIVITAMIN ADULT PO) Take by mouth.       08/16/2021    9:04 AM 05/26/2021    3:12 PM 05/09/2021    2:17 PM 04/17/2021    9:41 AM  GAD 7 : Generalized Anxiety Score  Nervous, Anxious, on Edge 0 0 0 0  Control/stop worrying 0 0 0 0  Worry too much - different things 0 0 0 0  Trouble relaxing 0 0 0 0  Restless 0 0 0 0  Easily annoyed or irritable 0 0 0 0  Afraid - awful might happen 0 0 0 0  Total GAD 7 Score 0 0 0 0  Anxiety Difficulty Not difficult at all Not difficult at all Not difficult at all Not difficult at all       08/16/2021    9:04 AM 07/12/2021    3:36 PM 05/26/2021    3:12 PM  Depression screen PHQ 2/9  Decreased Interest 0 0 0  Down, Depressed, Hopeless 0 0 0  PHQ - 2 Score 0 0 0  Altered sleeping 0 0 0  Tired, decreased energy 0 0 0  Change in appetite 0 0 0  Feeling bad or failure about yourself  0 0 0  Trouble concentrating 0 0 0  Moving slowly or fidgety/restless 0 0 0  Suicidal thoughts 0 0 0  PHQ-9 Score 0 0 0  Difficult doing work/chores Not difficult at all Not difficult at all Not difficult at all    BP Readings from Last 3 Encounters:  11/23/21 120/74  08/16/21 124/78  07/17/21 (!) 149/85     Physical Exam Vitals and nursing note reviewed.  Constitutional:      General: She is not in acute distress.    Appearance: Normal appearance. She is well-developed.  HENT:     Head: Normocephalic and atraumatic.  Cardiovascular:  Rate and Rhythm: Normal rate and regular rhythm.     Heart sounds: No murmur heard.    Comments: Edema to mid tibia Pulmonary:     Effort: Pulmonary effort is normal. No respiratory distress.     Breath sounds: No wheezing or rhonchi.  Musculoskeletal:     Cervical back: Normal range of motion.     Right lower leg: 1+ Edema present.     Left lower leg: 1+ Edema present.  Lymphadenopathy:     Cervical: No cervical adenopathy.  Skin:    General: Skin is warm and dry.     Findings: No rash.  Neurological:     Mental Status: She is alert and oriented to person, place, and time. Mental status is at baseline.     Gait: Gait abnormal.  Psychiatric:        Mood and Affect: Mood normal.        Behavior: Behavior normal.     Wt Readings from Last 3 Encounters:  11/23/21 154 lb (69.9 kg)  08/16/21 157 lb (71.2 kg)  07/17/21 160 lb (72.6 kg)    BP 120/74   Pulse 79   Ht '4\' 10"'  (1.473 m)   Wt 154 lb (69.9 kg)   SpO2 96%   BMI 32.19 kg/m   Assessment and Plan: 1. Bilateral leg edema Mild bilateral edema likely secondary to excess dietary sodium Limit sodium, continue water consumption Elevate if needed Would not recommend diuretics at this time due to minimal symptoms  Compression stockings can be considered but family would have to place these and they may not be well tolerated.  2. Type II diabetes mellitus with complication (HCC) - lisinopril (ZESTRIL) 2.5 MG tablet; Take 1 tablet (2.5 mg total) by mouth daily.  Dispense: 90 tablet; Refill: 3   Partially dictated using Editor, commissioning. Any errors are unintentional.  Halina Maidens, MD [facility]  11/23/2021

## 2021-12-04 ENCOUNTER — Other Ambulatory Visit
Admission: RE | Admit: 2021-12-04 | Discharge: 2021-12-04 | Disposition: A | Discharge status: Home / Self Care | Payer: Medicare Other | Attending: Urology | Admitting: Urology

## 2021-12-04 ENCOUNTER — Ambulatory Visit (INDEPENDENT_AMBULATORY_CARE_PROVIDER_SITE_OTHER): Payer: Medicare Other | Admitting: Urology

## 2021-12-04 ENCOUNTER — Other Ambulatory Visit: Payer: Self-pay

## 2021-12-04 DIAGNOSIS — Z87440 Personal history of urinary (tract) infections: Secondary | ICD-10-CM | POA: Insufficient documentation

## 2021-12-04 DIAGNOSIS — R350 Frequency of micturition: Secondary | ICD-10-CM

## 2021-12-04 LAB — URINALYSIS, COMPLETE (UACMP) WITH MICROSCOPIC
Bilirubin Urine: NEGATIVE
Glucose, UA: NEGATIVE mg/dL
Ketones, ur: NEGATIVE mg/dL
Nitrite: NEGATIVE
Protein, ur: NEGATIVE mg/dL
Specific Gravity, Urine: 1.015 (ref 1.005–1.030)
pH: 6.5 (ref 5.0–8.0)

## 2021-12-04 NOTE — Progress Notes (Signed)
PTNS  Session # 27 of 45  Health & Social Factors: no Caffeine: 0 Alcohol: 0 Daytime voids #per day: 4-5 Night-time voids #per night: 1 Urgency: mild Incontinence Episodes #per day: none Ankle used: right Treatment Setting: 2 Feeling/ Response: both Comments:  pt tolerated well  Performed By: Gerarda Gunther, RMA  Follow Up: as discussed,

## 2021-12-05 ENCOUNTER — Ambulatory Visit: Payer: Self-pay | Admitting: *Deleted

## 2021-12-05 NOTE — Patient Outreach (Signed)
**Note Sharon-Identified via Obfuscation** [facility] Customer service manager [facility]) Care Management  12/05/2021  Sharon York 12/21/1945 [id number]   [phone] . Last blood sugar is 109. A1C is 6.8. Rn will refer to care coordinator for hypertension. Blood pressure today is 163/88.  Plan: Refer to care coordinator for continued follow up care.   Sharon York BSN RN [facility] [phone]

## 2021-12-11 ENCOUNTER — Encounter: Payer: Self-pay | Admitting: Internal Medicine

## 2021-12-11 ENCOUNTER — Ambulatory Visit (INDEPENDENT_AMBULATORY_CARE_PROVIDER_SITE_OTHER): Payer: Medicare Other | Admitting: Internal Medicine

## 2021-12-11 VITALS — BP 130/84 | HR 65 | Ht <= 58 in | Wt 146.6 lb

## 2021-12-11 DIAGNOSIS — E785 Hyperlipidemia, unspecified: Secondary | ICD-10-CM | POA: Diagnosis not present

## 2021-12-11 DIAGNOSIS — E118 Type 2 diabetes mellitus with unspecified complications: Secondary | ICD-10-CM | POA: Diagnosis not present

## 2021-12-11 DIAGNOSIS — Z0000 Encounter for general adult medical examination without abnormal findings: Secondary | ICD-10-CM

## 2021-12-11 DIAGNOSIS — E1169 Type 2 diabetes mellitus with other specified complication: Secondary | ICD-10-CM | POA: Diagnosis not present

## 2021-12-11 DIAGNOSIS — M65331 Trigger finger, right middle finger: Secondary | ICD-10-CM

## 2021-12-11 DIAGNOSIS — M810 Age-related osteoporosis without current pathological fracture: Secondary | ICD-10-CM

## 2021-12-11 DIAGNOSIS — I69354 Hemiplegia and hemiparesis following cerebral infarction affecting left non-dominant side: Secondary | ICD-10-CM | POA: Diagnosis not present

## 2021-12-11 DIAGNOSIS — Z1231 Encounter for screening mammogram for malignant neoplasm of breast: Secondary | ICD-10-CM | POA: Diagnosis not present

## 2021-12-11 MED ORDER — METFORMIN HCL 500 MG PO TABS: ORAL_TABLET | ORAL | 1 refills | Status: DC

## 2021-12-11 MED ORDER — BACLOFEN 10 MG PO TABS: 10.0000 mg | ORAL_TABLET | Freq: Two times a day (BID) | ORAL | 3 refills | Status: DC

## 2021-12-11 MED ORDER — ATORVASTATIN CALCIUM 10 MG PO TABS: 10.0000 mg | ORAL_TABLET | Freq: Every day | ORAL | 3 refills | Status: DC

## 2021-12-11 MED ORDER — ALENDRONATE SODIUM 70 MG PO TABS: ORAL_TABLET | ORAL | 1 refills | Status: DC

## 2021-12-11 NOTE — Progress Notes (Signed)
Date:  12/11/2021   Name:  Sharon York   DOB:  1946/02/28   MRN:  [id number]   Chief Complaint: Annual Exam (Declined Breast Exam.) and Hand Pain (Hand stiffness and soreness for 1 week now. Middle finger does not want to straighten out sometimes. ) Sharon York is a 76 y.o. female who presents today for her Complete Annual Exam. She feels fairly well. She reports exercising some. She reports she is sleeping well. Breast complaints none..  Mammogram: 02/2021 DEXA: 07/2020 osteopenia hip - on Fosamax Pap smear: discontinued Colonoscopy: 09/2018 aged out  There are no preventive care reminders to display for this patient.   Immunization History  Administered Date(s) Administered   Fluad Quad(high Dose 65+) 01/16/2019, 02/11/2020, 02/02/2021   Influenza, High Dose Seasonal PF 02/06/2017, 01/22/2018   Influenza,inj,Quad PF,6+ Mos 02/18/2015   PFIZER(Purple Top)SARS-COV-2 Vaccination 06/01/2019, 06/22/2019, 02/25/2020   Pneumococcal Conjugate-13 06/22/2016   Pneumococcal Polysaccharide-23 06/11/2017   Tdap 07/10/2018   Zoster Recombinat (Shingrix) 01/30/2021, 04/05/2021    Diabetes She presents for her follow-up diabetic visit. She has type 2 diabetes mellitus. Her disease course has been stable. Pertinent negatives for hypoglycemia include no dizziness, headaches, nervousness/anxiousness or tremors. Associated symptoms include weakness. Pertinent negatives for diabetes include no chest pain, no fatigue, no polydipsia and no polyuria. Current diabetic treatments: metformin. There is no change in her home blood glucose trend. An ACE inhibitor/angiotensin II receptor blocker is being taken. Eye exam is current.  Hyperlipidemia This is a chronic problem. The problem is controlled. Pertinent negatives include no chest pain or shortness of breath. Current antihyperlipidemic treatment includes statins. The current treatment provides significant improvement of lipids.    Lab Results   Component Value Date   NA 140 12/07/2020   K 4.7 12/07/2020   CO2 27 12/07/2020   GLUCOSE 102 (H) 12/07/2020   BUN 9 12/07/2020   CREATININE 0.68 12/07/2020   CALCIUM 9.9 12/07/2020   EGFR 90 12/07/2020   GFRNONAA 79 12/03/2019   Lab Results  Component Value Date   CHOL 172 12/07/2020   HDL 71 12/07/2020   LDLCALC 86 12/07/2020   TRIG 81 12/07/2020   CHOLHDL 2.4 12/07/2020   Lab Results  Component Value Date   TSH 3.620 12/07/2020   Lab Results  Component Value Date   HGBA1C 6.8 (H) 08/16/2021   Lab Results  Component Value Date   WBC 6.0 12/07/2020   HGB 12.4 12/07/2020   HCT 39.2 12/07/2020   MCV 73 (L) 12/07/2020   PLT 300 12/07/2020   Lab Results  Component Value Date   ALT 15 12/07/2020   AST 21 12/07/2020   ALKPHOS 52 12/07/2020   BILITOT 0.3 12/07/2020   No results found for: "25OHVITD2", "25OHVITD3", "VD25OH"   Review of Systems  Constitutional:  Negative for chills, fatigue and fever.  HENT:  Negative for congestion, hearing loss, tinnitus, trouble swallowing and voice change.   Eyes:  Negative for visual disturbance.  Respiratory:  Negative for cough, chest tightness, shortness of breath and wheezing.   Cardiovascular:  Negative for chest pain, palpitations and leg swelling.  Gastrointestinal:  Negative for abdominal pain, constipation, diarrhea and vomiting.  Endocrine: Negative for polydipsia and polyuria.  Genitourinary:  Negative for dysuria, frequency, genital sores, vaginal bleeding and vaginal discharge.  Musculoskeletal:  Positive for arthralgias (triggering right middle finger) and gait problem. Negative for joint swelling.  Skin:  Negative for color change and rash.  Neurological:  Positive for  weakness. Negative for dizziness, tremors, light-headedness and headaches.  Hematological:  Negative for adenopathy. Does not bruise/bleed easily.  Psychiatric/Behavioral:  Negative for dysphoric mood and sleep disturbance. The patient is not  nervous/anxious.     Patient Active Problem List   Diagnosis Date Noted   Microalbuminuria due to type 2 diabetes mellitus ([place]) 12/07/2019   Type II diabetes mellitus with complication ([place]) 26/71/2458   Right shoulder pain 03/31/2018   Hyperlipidemia associated with type 2 diabetes mellitus ([place]) 06/12/2017   Tinea corporis 03/17/2015   Abnormal gait 12/15/2014   Altered bowel function 12/15/2014   Hemiparesis affecting left side as late effect of cerebrovascular accident (CVA) ([place]) 12/15/2014   Obstructive sleep apnea of adult 12/15/2014   Arthritis of shoulder region, 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     Past Surgical History:  Procedure Laterality Date   [place]   COLONOSCOPY  2010   normal    Social History   Tobacco Use   Smoking status: Never    Passive exposure: Never   Smokeless tobacco: Never   Tobacco comments:    smoking cessation materials not required  Vaping Use   Vaping Use: Never used  Substance Use Topics   Alcohol use: No    Alcohol/week: 0.0 standard drinks of alcohol   Drug use: Never     Medication list has been reviewed and updated.  Current Meds  Medication Sig   ACCU-CHEK GUIDE test strip USE 1 STRIP TO CHECK GLUCOSE 4 TIMES DAILY   Accu-Chek Softclix Lancets lancets USE TO CHECK GLUCOSE UP TO 4 TIMES DAILY   alendronate (FOSAMAX) 70 MG tablet TAKE 1 TABLET BY MOUTH ONCE A WEEK. TAKE WITH A FULL GLASS OF WATER ON AN EMPTY STOMACH.   aspirin 81 MG chewable tablet Chew 1 tablet by mouth daily.   atorvastatin (LIPITOR) 10 MG tablet Take 1 tablet (10 mg total) by mouth daily.   baclofen (LIORESAL) 10 MG tablet Take 1 tablet by mouth twice  daily   blood glucose meter kit and supplies Dispense based on patient and insurance preference. Use up to four times daily as directed. (FOR ICD-10 E10.9, E11.9).   Calcium Carbonate-Vitamin D 500-125 MG-UNIT TABS Take 1 tablet by mouth daily.    Continuous Blood Gluc Receiver (FREESTYLE LIBRE 14 DAY READER) DEVI 1 each by Does not apply route daily at 6 (six) AM.   Continuous Blood Gluc Sensor (FREESTYLE LIBRE 3 SENSOR) MISC 1 each by Does not apply route every 14 (fourteen) days. Place 1 sensor on the skin every 14 days. Use to check glucose continuously   Glucose Blood Automatic (POGO AUTOMATIC TEST CARTRIDGES) TEST 1 Cartridge by In Vitro route 3 (three) times daily.   lisinopril (ZESTRIL) 2.5 MG tablet Take 1 tablet (2.5 mg total) by mouth daily.   metFORMIN (GLUCOPHAGE) 500 MG tablet TAKE 1 TABLET BY MOUTH ONCE DAILY WITH BREAKFAST -  REPLACES  250  MG  DOSE   Multiple Vitamins-Minerals (MULTIVITAMIN ADULT PO) Take by mouth.       12/11/2021    9:12 AM 08/16/2021    9:04 AM 05/26/2021    3:12 PM 05/09/2021    2:17 PM  GAD 7 : Generalized Anxiety Score  Nervous, Anxious, on Edge 0 0 0 0  Control/stop worrying 0 0 0 0  Worry too much - different things 0 0 0 0  Trouble relaxing 0 0 0 0  Restless 0 0 0 0  Easily annoyed or irritable 0 0 0 0  Afraid - awful might happen 0 0 0 0  Total GAD 7 Score 0 0 0 0  Anxiety Difficulty  Not difficult at all Not difficult at all Not difficult at all       12/11/2021    9:12 AM 08/16/2021    9:04 AM 07/12/2021    3:36 PM  Depression screen PHQ 2/9  Decreased Interest 0 0 0  Down, Depressed, Hopeless 0 0 0  PHQ - 2 Score 0 0 0  Altered sleeping 0 0 0  Tired, decreased energy 0 0 0  Change in appetite 0 0 0  Feeling bad or failure about yourself  0 0 0  Trouble concentrating 0 0 0  Moving slowly or fidgety/restless 0 0 0  Suicidal thoughts 0 0 0  PHQ-9 Score 0 0 0  Difficult doing work/chores Not difficult at all Not difficult at all Not  difficult at all    BP Readings from Last 3 Encounters:  12/11/21 130/84  11/23/21 120/74  08/16/21 124/78    Physical Exam Vitals and nursing note reviewed.  Constitutional:      General: She is not in acute distress.    Appearance: She is well-developed. She is obese.  HENT:     Head: Normocephalic and atraumatic.  Neck:     Vascular: No carotid bruit.  Cardiovascular:     Rate and Rhythm: Normal rate and regular rhythm.     Pulses: Normal pulses.     Heart sounds: No murmur heard. Pulmonary:     Effort: Pulmonary effort is normal. No respiratory distress.     Breath sounds: No wheezing or rhonchi.  Abdominal:     Palpations: Abdomen is soft.     Tenderness: There is no abdominal tenderness.  Musculoskeletal:     Right hand: Tenderness (base of middle finger) present.     Cervical back: Normal range of motion.     Right lower leg: No edema.     Left lower leg: Edema present.  Lymphadenopathy:     Cervical: No cervical adenopathy.  Skin:    General: Skin is warm and dry.     Capillary Refill: Capillary refill takes less than 2 seconds.     Findings: No rash.  Neurological:     Mental Status: She is alert and oriented to person, place, and time. Mental status is at baseline.     Comments: Dense left hemiparesis  Psychiatric:        Mood and Affect: Mood normal.        Behavior: Behavior normal.     Wt Readings from Last 3 Encounters:  12/11/21 146 lb 9.6 oz (66.5 kg)  11/23/21 154 lb (69.9 kg)  08/16/21 157 lb (71.2 kg)    BP 130/84   Pulse 65   Ht '4\' 10"'  (1.473 m)   Wt 146 lb 9.6 oz (66.5 kg)   SpO2 95%   BMI 30.64 kg/m   Assessment and Plan: 1. Annual physical exam Stable exam without acute findings Up to date on screenings and immunizations. - CBC with Differential/Platelet - TSH  2. Encounter for screening mammogram for breast cancer Schedule in October -  MM 3D SCREEN BREAST BILATERAL  3. Type II diabetes mellitus with complication  (HCC) Clinically stable by exam and report without s/s of hypoglycemia. DM complicated by hypertension and dyslipidemia. Tolerating medications well without side effects or other concerns. - Comprehensive metabolic panel - Hemoglobin A1c - metFORMIN (GLUCOPHAGE) 500 MG tablet; TAKE 1 TABLET BY MOUTH ONCE DAILY WITH BREAKFAST -  REPLACES  250  MG  DOSE  Dispense: 90 tablet; Refill: 1  4. Hyperlipidemia associated with type 2 diabetes mellitus ([place]) Tolerating statin medication without side effects at this time LDL is at goal of < 70 on current dose Continue same therapy without change at this time. - Lipid panel - atorvastatin (LIPITOR) 10 MG tablet; Take 1 tablet (10 mg total) by mouth daily.  Dispense: 90 tablet; Refill: 3  5. Age-related osteoporosis without current pathological fracture Continue calcium and vitamin D and Fosamax Repeat DEXA in 1-2 years - VITAMIN D 25 Hydroxy (Vit-D Deficiency, Fractures) - alendronate (FOSAMAX) 70 MG tablet; TAKE 1 TABLET BY MOUTH ONCE A WEEK. TAKE WITH A FULL GLASS OF WATER ON AN EMPTY STOMACH.  Dispense: 12 tablet; Refill: 1  6. Trigger finger, right middle finger Minimal symptoms at present - if worsening, can refer to [facility] for injection  7. Hemiparesis affecting left side as late effect of cerebrovascular accident (CVA) ([place]) Stable with some back and shoulder pain - baclofen (LIORESAL) 10 MG tablet; Take 1 tablet (10 mg total) by mouth 2 (two) times daily.  Dispense: 180 tablet; Refill: 3   Partially dictated using Editor, commissioning. Any errors are unintentional.  Halina Maidens, MD [facility]  12/11/2021

## 2021-12-12 LAB — CBC WITH DIFFERENTIAL/PLATELET
Basophils Absolute: 0 10*3/uL (ref 0.0–0.2)
Basos: 0 %
EOS (ABSOLUTE): 0.1 10*3/uL (ref 0.0–0.4)
Eos: 2 %
Hematocrit: 37 % (ref 34.0–46.6)
Hemoglobin: 12 g/dL (ref 11.1–15.9)
Immature Grans (Abs): 0 10*3/uL (ref 0.0–0.1)
Immature Granulocytes: 0 %
Lymphocytes Absolute: 2.2 10*3/uL (ref 0.7–3.1)
Lymphs: 46 %
MCH: 23.1 pg — ABNORMAL LOW (ref 26.6–33.0)
MCHC: 32.4 g/dL (ref 31.5–35.7)
MCV: 71 fL — ABNORMAL LOW (ref 79–97)
Monocytes Absolute: 0.5 10*3/uL (ref 0.1–0.9)
Monocytes: 9 %
Neutrophils Absolute: 2 10*3/uL (ref 1.4–7.0)
Neutrophils: 43 %
Platelets: 269 10*3/uL (ref 150–450)
RBC: 5.2 x10E6/uL (ref 3.77–5.28)
RDW: 15.7 % — ABNORMAL HIGH (ref 11.7–15.4)
WBC: 4.8 10*3/uL (ref 3.4–10.8)

## 2021-12-12 LAB — COMPREHENSIVE METABOLIC PANEL
ALT: 13 IU/L (ref 0–32)
AST: 16 IU/L (ref 0–40)
Albumin/Globulin Ratio: 1.6 (ref 1.2–2.2)
Albumin: 4.2 g/dL (ref 3.8–4.8)
Alkaline Phosphatase: 51 IU/L (ref 44–121)
BUN/Creatinine Ratio: 18 (ref 12–28)
BUN: 11 mg/dL (ref 8–27)
Bilirubin Total: <0.2 mg/dL (ref 0.0–1.2)
CO2: 26 mmol/L (ref 20–29)
Calcium: 9.4 mg/dL (ref 8.7–10.3)
Chloride: 103 mmol/L (ref 96–106)
Creatinine, Ser: 0.62 mg/dL (ref 0.57–1.00)
Globulin, Total: 2.7 g/dL (ref 1.5–4.5)
Glucose: 106 mg/dL — ABNORMAL HIGH (ref 70–99)
Potassium: 4.6 mmol/L (ref 3.5–5.2)
Sodium: 142 mmol/L (ref 134–144)
Total Protein: 6.9 g/dL (ref 6.0–8.5)
eGFR: 91 mL/min/{1.73_m2} (ref >59)

## 2021-12-12 LAB — LIPID PANEL
Chol/HDL Ratio: 2.1 ratio (ref 0.0–4.4)
Cholesterol, Total: 152 mg/dL (ref 100–199)
HDL: 72 mg/dL (ref >39)
LDL Chol Calc (NIH): 64 mg/dL (ref 0–99)
Triglycerides: 83 mg/dL (ref 0–149)
VLDL Cholesterol Cal: 16 mg/dL (ref 5–40)

## 2021-12-12 LAB — HEMOGLOBIN A1C
Est. average glucose Bld gHb Est-mCnc: 143 mg/dL
Hgb A1c MFr Bld: 6.6 % — ABNORMAL HIGH (ref 4.8–5.6)

## 2021-12-12 LAB — VITAMIN D 25 HYDROXY (VIT D DEFICIENCY, FRACTURES): Vit D, 25-Hydroxy: 123.2 ng/mL — ABNORMAL HIGH (ref 30.0–100.0)

## 2021-12-12 LAB — TSH: TSH: 3.42 u[IU]/mL (ref 0.450–4.500)

## 2021-12-13 ENCOUNTER — Telehealth: Payer: Self-pay

## 2021-12-13 NOTE — Telephone Encounter (Signed)
Pt given lab results per notes of Dr. Judithann Palma on 12/13/21. Pt verbalized understanding.

## 2021-12-22 ENCOUNTER — Telehealth: Payer: Self-pay

## 2021-12-22 NOTE — Telephone Encounter (Signed)
Called pt as a reminder to call and schedule her mammogram. Pt verbalized understanding.  KP

## 2021-12-26 ENCOUNTER — Ambulatory Visit: Payer: Medicare Other | Admitting: *Deleted

## 2022-01-01 ENCOUNTER — Ambulatory Visit (INDEPENDENT_AMBULATORY_CARE_PROVIDER_SITE_OTHER): Payer: Medicare Other | Admitting: Urology

## 2022-01-01 ENCOUNTER — Encounter: Payer: Self-pay | Admitting: Urology

## 2022-01-01 ENCOUNTER — Ambulatory Visit: Payer: Medicare Other | Admitting: Urology

## 2022-01-01 VITALS — BP 132/71 | HR 80 | Ht <= 58 in | Wt 151.0 lb

## 2022-01-01 DIAGNOSIS — N3281 Overactive bladder: Secondary | ICD-10-CM

## 2022-01-01 DIAGNOSIS — R319 Hematuria, unspecified: Secondary | ICD-10-CM

## 2022-01-01 DIAGNOSIS — R350 Frequency of micturition: Secondary | ICD-10-CM

## 2022-01-01 DIAGNOSIS — N3946 Mixed incontinence: Secondary | ICD-10-CM

## 2022-01-01 LAB — BLADDER SCAN AMB NON-IMAGING

## 2022-01-01 NOTE — Progress Notes (Incomplete)
01/01/22 6:56 AM   [name] [birth year] [id number]  Referring provider:  [name], MD [address] [place],  [place] [address] No chief complaint on file.   Urological history  History of rUTIs - Contributing factors are age, vaginal atrophy and incontinence   2. History of high risk hematuria  - Non-smoker  - CTU in 07/2017 noted the adrenal glands and kidneys are unremarkable. No renal, ureteral or bladder calculi or mass. The delayed images do not demonstrate any significant collecting system abnormalities and both ureters appear normal other than being deviated laterally in the pelvis by the enlarged fibroid uterus. No bladder mass or asymmetric bladder wall thickening. - Cystoscopy in 07/2017 with Dr Matilde Sprang was unremarkable     3. Frequency  - Failed OAB medication  - On PTNS  HPI: Sharon York is a 76 y.o.female who presents today for PTNS and office visit.       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         Medication List        Accurate as of January 01, 2022  6:56 AM. If you have any questions, ask your nurse or doctor.          Accu-Chek Softclix Lancets lancets USE TO CHECK GLUCOSE UP TO 4 TIMES DAILY   alendronate 70 MG tablet Commonly known as: FOSAMAX TAKE 1 TABLET BY  MOUTH ONCE A WEEK. TAKE WITH A FULL GLASS OF WATER ON AN EMPTY STOMACH.   aspirin 81 MG chewable tablet Chew 1 tablet by mouth daily.   atorvastatin 10 MG tablet Commonly known as: LIPITOR Take 1 tablet (10 mg total) by mouth daily.   baclofen 10 MG tablet Commonly known as: LIORESAL Take 1 tablet (10 mg total) by mouth 2 (two) times daily.   blood glucose meter kit and supplies Dispense based on patient and insurance preference. Use up to four times daily as directed. (FOR ICD-10 E10.9, E11.9).   Calcium Carbonate-Vitamin D 500-125 MG-UNIT Tabs Take 1 tablet by mouth daily.   FreeStyle Libre 14 Day Reader Kerrin Mo 1 each by Does not apply route daily at 6 (six) AM.   FreeStyle Libre 3 Sensor Misc 1 each by Does not apply route every 14 (fourteen) days. Place 1 sensor on the skin every 14 days. Use to check glucose continuously   lisinopril 2.5 MG tablet Commonly known as: Zestril Take 1 tablet (2.5 mg total) by mouth daily.   metFORMIN 500 MG tablet Commonly known as: GLUCOPHAGE TAKE 1 TABLET BY MOUTH ONCE DAILY WITH BREAKFAST -  REPLACES  250  MG  DOSE   MULTIVITAMIN ADULT PO Take by mouth.   POGO Automatic Test Cartridges Test Generic drug: Glucose Blood  Automatic 1 Cartridge by In Vitro route 3 (three) times daily.   Accu-Chek Guide test strip Generic drug: glucose blood USE 1 STRIP TO CHECK GLUCOSE 4 TIMES DAILY        Allergies:  Allergies  Allergen Reactions   Nitrofurantoin Nausea Only   Iodinated Contrast Media Hives and Cough    Patient developed a hive on her left lip after injection as well as some coughing.     Family History: Family History  Problem Relation Age of Onset   Diabetes Mother    Thyroid disease Mother    Breast cancer Maternal Aunt    Breast cancer Cousin        mat cousin   Prostate cancer Brother    Kidney failure Brother    Kidney cancer Son    Bladder Cancer Neg Hx     Social History:  reports that she has never smoked.  She has never been exposed to tobacco smoke. She has never used smokeless tobacco. She reports that she does not drink alcohol and does not use drugs.   Physical Exam: There were no vitals taken for this visit.  Constitutional:  Alert and oriented, No acute distress. HEENT: [place] AT, moist mucus membranes.  Trachea midline, no masses. Cardiovascular: No clubbing, cyanosis, or edema. Respiratory: Normal respiratory effort, no increased work of breathing. GI: Abdomen is soft, nontender, nondistended, no abdominal masses GU: No CVA tenderness Lymph: No cervical or inguinal lymphadenopathy. Skin: No rashes, bruises or suspicious lesions. Neurologic: Grossly intact, no focal deficits, moving all 4 extremities. Psychiatric: Normal mood and affect.  Laboratory Data:  Lab Results  Component Value Date   CREATININE 0.62 12/11/2021     Lab Results  Component Value Date   HGBA1C 6.6 (H) 12/11/2021    Urinalysis   Pertinent Imaging:   Assessment & Plan:     No follow-ups on file.  [facility] [address] [place], [place] [address] [phone]  I,Kailey Littlejohn,acting as a scribe for [facility], PA-C.,have documented all relevant documentation on the behalf of [name] MCGOWAN, PA-C,as directed by  [facility], PA-C while in the presence of [place], PA-C.

## 2022-01-01 NOTE — Progress Notes (Signed)
01/01/22 11:02 PM   Sharon York 12-28-1945 [id number]  Referring provider:  [name], MD [address] [place],  [place] [address]  Urological history  1. rUTI's  -contributing factors of age, vaginal atrophy, diabetes, incontinence and constipation  -documented positive urine cultures over the last year       E.coli 01/2021   2. High risk hematuria  -non-smoker  -CTU, 2019 - NED  -cysto, 2019 - NED  -no reports of gross heme  -UA negative for micro heme   3. OAB  -contributing factors of age, vaginal atrophy and diabetes  -PVR 43 mL -managed with PTNS  4. Mixed incontinence  -contributing factors of age, vaginal atrophy, diabetes, sleep apnea, arthritis and CVA  -failed pessary  -PVR 43 mL -managed with PTNS  Chief Complaint  Patient presents with   Urinary Frequency    PTNS w/PVR    HPI: Sharon York is a 76 y.o.female who presets today for PTNS and office visit.   She has 0-3x daily where she rushes to bathroom . 0-3x daily she uses bathroom daily. 0- 3x daily she limits fluid intake, she does not engage in toilet mapping, 0-3 urine leakage, 0-3  nighttime.   Patient denies any modifying or aggravating factors.  Patient denies any gross hematuria, dysuria or suprapubic/flank pain.  Patient denies any fevers, chills, nausea or vomiting.    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         Medication List        Accurate as of January 01, 2022 11:02 PM. If you have any questions, ask your nurse or doctor.          STOP taking these medications    Calcium Carbonate-Vitamin D 500-125 MG-UNIT Tabs Stopped by: Zara Council, PA-C   FreeStyle Libre 14 Day Reader Kerrin Mo Stopped by: Zara Council, PA-C   FreeStyle Libre 3 Sensor Misc Stopped by: Zara Council, PA-C       TAKE these medications    Accu-Chek Softclix Lancets lancets USE TO CHECK GLUCOSE UP TO 4 TIMES DAILY   alendronate 70 MG tablet Commonly known as: FOSAMAX TAKE 1 TABLET BY MOUTH ONCE A WEEK. TAKE WITH A FULL GLASS OF WATER ON AN EMPTY STOMACH.   aspirin 81 MG chewable tablet Chew 1 tablet by mouth daily.   atorvastatin 10 MG tablet Commonly known as: LIPITOR Take 1 tablet (10 mg total) by mouth daily.   baclofen 10 MG tablet Commonly known as: LIORESAL Take 1 tablet (10 mg total) by mouth 2 (two) times daily.   blood glucose meter kit and supplies Dispense based on patient and insurance  preference. Use up to four times daily as directed. (FOR ICD-10 E10.9, E11.9).   lisinopril 2.5 MG tablet Commonly known as: Zestril Take 1 tablet (2.5 mg total) by mouth daily.   metFORMIN 500 MG tablet Commonly known as: GLUCOPHAGE TAKE 1 TABLET BY MOUTH ONCE DAILY WITH BREAKFAST -  REPLACES  250  MG  DOSE   MULTIVITAMIN ADULT PO Take by mouth.   POGO Automatic Test Cartridges Test Generic drug: Glucose Blood Automatic 1 Cartridge by In Vitro route 3 (three) times daily.   Accu-Chek Guide test strip Generic drug: glucose blood USE 1 STRIP TO CHECK GLUCOSE 4 TIMES DAILY        Allergies:  Allergies  Allergen Reactions   Nitrofurantoin Nausea Only   Iodinated Contrast Media Hives and Cough    Patient developed a hive  on her left lip after injection as well as some coughing.     Family History: Family History  Problem Relation Age of Onset   Diabetes Mother    Thyroid disease Mother    Breast cancer Maternal Aunt    Breast cancer Cousin        mat cousin   Prostate cancer Brother    Kidney failure Brother    Kidney cancer Son    Bladder Cancer Neg Hx     Social History:  reports that she has never smoked. She has never been exposed to tobacco smoke. She has never used smokeless tobacco. She reports that she does not drink alcohol and does not use drugs.   Physical Exam: BP 132/71   Pulse 80   Ht '4\' 5"'  (1.346 m)   Wt 151 lb (68.5 kg)   BMI 37.79 kg/m   Constitutional:  Alert and oriented, No acute distress. HEENT: [place] AT, moist mucus membranes.  Trachea midline, no masses. Cardiovascular: No clubbing, cyanosis, or edema. Respiratory: Normal respiratory effort, no increased work of breathing. Skin: No rashes, bruises or suspicious lesions. Neurologic: Grossly intact, no focal deficits, moving all 4 extremities. Psychiatric: Normal mood and affect.  Laboratory Data: Lab Results  Component Value Date   CREATININE 0.62 12/11/2021   Lab Results  Component Value Date   HGBA1C 6.6 (H) 12/11/2021      Latest Ref Rng & Units 12/11/2021   10:16 AM 12/07/2020   10:44 AM 01/16/2019    9:51 AM  CBC  WBC 3.4 - 10.8 x10E3/uL 4.8  6.0  5.9   Hemoglobin 11.1 - 15.9 g/dL 12.0  12.4  12.1   Hematocrit 34.0 - 46.6 % 37.0  39.2  38.9   Platelets 150 - 450 x10E3/uL 269  300  275    CMP     Component Value Date/Time   NA 142 12/11/2021 1016   K 4.6 12/11/2021 1016   CL 103 12/11/2021 1016   CO2 26 12/11/2021 1016   GLUCOSE 106 (H) 12/11/2021 1016   GLUCOSE 115 (H) 02/10/2015 2355   BUN 11 12/11/2021 1016   CREATININE 0.62 12/11/2021 1016   CALCIUM 9.4 12/11/2021 1016   PROT 6.9 12/11/2021 1016   ALBUMIN 4.2 12/11/2021 1016   AST 16 12/11/2021 1016   ALT 13 12/11/2021 1016   ALKPHOS 51  12/11/2021 1016   BILITOT <0.2 12/11/2021 1016   GFRNONAA 79 12/03/2019 0908   GFRAA 91 12/03/2019 0908     Pertinent Imaging: Results for orders placed or performed in visit on 01/01/22  BLADDER SCAN AMB NON-IMAGING  Result Value Ref Range   Scan Result  71m     Assessment & Plan:    1. OAB/Mixed incontinence - Failed OAB medication - PTNS today  - She is very pleased with urinary symptoms   2. High risk hematuria -non-smoker -work up in 2019 - NED -no reports of gross heme -UA negative for micro heme  Return in about 1 month (around 02/01/2022) for Maintenance PTNS.  [address] [place] [phone] I, KEstill Dooms acting as a sEducation administratorfor [facility] PA-C.,have documented all relevant documentation on the behalf of Unknown Schleyer, PA-C,as directed by  [facility] PA-C while in the presence of [place] PA-C.  I have reviewed the above documentation for accuracy and completeness, and I agree with the above.    SZara Council PA-C

## 2022-01-01 NOTE — Progress Notes (Signed)
PTNS  Session # 28 of 45  Health & Social Factors: no change Caffeine: 0 Alcohol: 0 Daytime voids #per day: 4 Night-time voids #per night: 0 Urgency: mild Incontinence Episodes #per day: 0 Ankle used: right Treatment Setting: 4 Feeling/ Response: sensory Comments: patient tolerated well  Performed By: Eligha Bridegroom, CMA  Follow Up: 1 month

## 2022-01-14 DIAGNOSIS — Z8673 Personal history of transient ischemic attack (TIA), and cerebral infarction without residual deficits: Secondary | ICD-10-CM | POA: Diagnosis not present

## 2022-01-14 DIAGNOSIS — Z7982 Long term (current) use of aspirin: Secondary | ICD-10-CM | POA: Diagnosis not present

## 2022-01-14 DIAGNOSIS — Z0130 Encounter for examination of blood pressure without abnormal findings: Secondary | ICD-10-CM | POA: Diagnosis not present

## 2022-01-14 DIAGNOSIS — E785 Hyperlipidemia, unspecified: Secondary | ICD-10-CM | POA: Diagnosis not present

## 2022-01-14 DIAGNOSIS — Z7983 Long term (current) use of bisphosphonates: Secondary | ICD-10-CM | POA: Diagnosis not present

## 2022-01-14 DIAGNOSIS — Z79899 Other long term (current) drug therapy: Secondary | ICD-10-CM | POA: Diagnosis not present

## 2022-01-14 DIAGNOSIS — E119 Type 2 diabetes mellitus without complications: Secondary | ICD-10-CM | POA: Diagnosis not present

## 2022-01-14 DIAGNOSIS — I10 Essential (primary) hypertension: Secondary | ICD-10-CM | POA: Diagnosis not present

## 2022-01-14 DIAGNOSIS — M7989 Other specified soft tissue disorders: Secondary | ICD-10-CM | POA: Diagnosis not present

## 2022-01-14 DIAGNOSIS — M810 Age-related osteoporosis without current pathological fracture: Secondary | ICD-10-CM | POA: Diagnosis not present

## 2022-01-15 ENCOUNTER — Telehealth: Payer: Self-pay

## 2022-01-15 NOTE — Telephone Encounter (Signed)
error 

## 2022-01-18 ENCOUNTER — Ambulatory Visit (INDEPENDENT_AMBULATORY_CARE_PROVIDER_SITE_OTHER): Payer: Medicare Other | Admitting: Internal Medicine

## 2022-01-18 ENCOUNTER — Encounter: Payer: Self-pay | Admitting: Internal Medicine

## 2022-01-18 VITALS — BP 138/70 | HR 82 | Ht <= 58 in | Wt 154.0 lb

## 2022-01-18 DIAGNOSIS — E118 Type 2 diabetes mellitus with unspecified complications: Secondary | ICD-10-CM

## 2022-01-18 DIAGNOSIS — I10 Essential (primary) hypertension: Secondary | ICD-10-CM | POA: Diagnosis not present

## 2022-01-18 MED ORDER — LISINOPRIL 10 MG PO TABS: 10.0000 mg | ORAL_TABLET | Freq: Every day | ORAL | 1 refills | Status: DC

## 2022-01-18 NOTE — Progress Notes (Signed)
Date:  01/18/2022   Name:  Sharon York   DOB:  May 06, 1946   MRN:  [id number]   Chief Complaint: Hypertension  Hypertension This is a new problem. Episode onset: had elevated reading at home with wrist cuff - went to ED and was much better. Pertinent negatives include no chest pain, headaches, palpitations or shortness of breath.  She is on Lisinopril 2.5 mg for renal protection. Her BP at home was elevated with a wrist cuff.  Now she is taking it with an arm cuff but her daughter has to help due to her LUE weakness.  Lab Results  Component Value Date   NA 142 12/11/2021   K 4.6 12/11/2021   CO2 26 12/11/2021   GLUCOSE 106 (H) 12/11/2021   BUN 11 12/11/2021   CREATININE 0.62 12/11/2021   CALCIUM 9.4 12/11/2021   EGFR 91 12/11/2021   GFRNONAA 79 12/03/2019   Lab Results  Component Value Date   CHOL 152 12/11/2021   HDL 72 12/11/2021   LDLCALC 64 12/11/2021   TRIG 83 12/11/2021   CHOLHDL 2.1 12/11/2021   Lab Results  Component Value Date   TSH 3.420 12/11/2021   Lab Results  Component Value Date   HGBA1C 6.6 (H) 12/11/2021   Lab Results  Component Value Date   WBC 4.8 12/11/2021   HGB 12.0 12/11/2021   HCT 37.0 12/11/2021   MCV 71 (L) 12/11/2021   PLT 269 12/11/2021   Lab Results  Component Value Date   ALT 13 12/11/2021   AST 16 12/11/2021   ALKPHOS 51 12/11/2021   BILITOT <0.2 12/11/2021   Lab Results  Component Value Date   VD25OH 123.2 (H) 12/11/2021     Review of Systems  Constitutional:  Negative for fatigue and unexpected weight change.  HENT:  Negative for nosebleeds.   Eyes:  Negative for visual disturbance.  Respiratory:  Negative for cough, chest tightness, shortness of breath and wheezing.   Cardiovascular:  Positive for leg swelling (mild left leg swelling off and on). Negative for chest pain and palpitations.  Gastrointestinal:  Negative for abdominal pain, constipation and diarrhea.  Neurological:  Negative for dizziness, weakness,  light-headedness and headaches.    Patient Active Problem List   Diagnosis Date Noted   Trigger finger, right middle finger 12/11/2021   Microalbuminuria due to type 2 diabetes mellitus ([place]) 12/07/2019   Type II diabetes mellitus with complication ([place]) 36/04/2448   Right shoulder pain 03/31/2018   Hyperlipidemia associated with type 2 diabetes mellitus ([place]) 06/12/2017   Tinea corporis 03/17/2015   Abnormal gait 12/15/2014   Altered bowel function 12/15/2014   Hemiparesis affecting left side as late effect of cerebrovascular accident (CVA) ([place]) 12/15/2014   Obstructive sleep apnea of adult 12/15/2014   Arthritis of shoulder region, 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     Past Surgical History:  Procedure Laterality Date   [place]   COLONOSCOPY  2010   normal    Social History   Tobacco Use   Smoking status: Never    Passive exposure: Never   Smokeless tobacco: Never   Tobacco comments:    smoking cessation materials not  required  Vaping Use   Vaping Use: Never used  Substance Use Topics   Alcohol use: No    Alcohol/week: 0.0 standard drinks of alcohol   Drug use: Never     Medication list has been reviewed and updated.  Current Meds  Medication Sig   ACCU-CHEK GUIDE test strip USE 1 STRIP TO CHECK GLUCOSE 4 TIMES DAILY   Accu-Chek Softclix Lancets lancets USE TO CHECK GLUCOSE UP TO 4 TIMES DAILY   alendronate (FOSAMAX) 70 MG tablet TAKE 1 TABLET BY MOUTH ONCE A WEEK. TAKE WITH A FULL GLASS OF WATER ON AN EMPTY STOMACH.   aspirin 81 MG chewable tablet Chew 1 tablet by mouth daily.   atorvastatin (LIPITOR) 10 MG tablet Take 1 tablet (10 mg total) by mouth daily.    baclofen (LIORESAL) 10 MG tablet Take 1 tablet (10 mg total) by mouth 2 (two) times daily.   blood glucose meter kit and supplies Dispense based on patient and insurance preference. Use up to four times daily as directed. (FOR ICD-10 E10.9, E11.9).   Glucose Blood Automatic (POGO AUTOMATIC TEST CARTRIDGES) TEST 1 Cartridge by In Vitro route 3 (three) times daily.   lisinopril (ZESTRIL) 2.5 MG tablet Take 1 tablet (2.5 mg total) by mouth daily.   metFORMIN (GLUCOPHAGE) 500 MG tablet TAKE 1 TABLET BY MOUTH ONCE DAILY WITH BREAKFAST -  REPLACES  250  MG  DOSE   Multiple Vitamins-Minerals (MULTIVITAMIN ADULT PO) Take by mouth.       01/18/2022    9:08 AM 12/11/2021    9:12 AM 08/16/2021    9:04 AM 05/26/2021    3:12 PM  GAD 7 : Generalized Anxiety Score  Nervous, Anxious, on Edge 0 0 0 0  Control/stop worrying 0 0 0 0  Worry too much - different things 0 0 0 0  Trouble relaxing 0 0 0 0  Restless 0 0 0 0  Easily annoyed or irritable 0 0 0 0  Afraid - awful might happen 0 0 0 0  Total GAD 7 Score 0 0 0 0  Anxiety Difficulty Not difficult at all  Not difficult at all Not difficult at all       01/18/2022    9:08 AM 12/11/2021    9:12 AM 08/16/2021    9:04 AM  Depression screen PHQ 2/9  Decreased Interest 0 0 0  Down, Depressed, Hopeless 0 0 0  PHQ - 2 Score 0 0 0  Altered sleeping 0 0 0  Tired, decreased energy 0 0 0  Change in appetite 0 0 0  Feeling bad or failure about yourself  0 0 0  Trouble concentrating 0 0 0  Moving slowly or fidgety/restless 0 0 0  Suicidal thoughts 0 0 0  PHQ-9 Score 0 0 0  Difficult doing work/chores Not difficult at all Not difficult at all Not difficult at all    BP Readings from Last 3 Encounters:  01/18/22 138/70  01/01/22 132/71  12/11/21 130/84    Physical Exam Vitals and nursing note reviewed.  Constitutional:      General: She is not in acute distress.    Appearance: Normal appearance. She is well-developed.  HENT:     Head:  Normocephalic and atraumatic.  Cardiovascular:     Rate and Rhythm: Normal rate and regular rhythm.  Pulmonary:     Effort: Pulmonary effort is normal. No respiratory distress.     Breath sounds: No wheezing or rhonchi.  Musculoskeletal:  Cervical back: Normal range of motion.     Right lower leg: No edema.     Left lower leg: Edema present.  Skin:    General: Skin is warm and dry.     Capillary Refill: Capillary refill takes less than 2 seconds.     Findings: No rash.  Neurological:     Mental Status: She is alert and oriented to person, place, and time. Mental status is at baseline.     Motor: Weakness and atrophy present.     Comments: LUE and LLE  Psychiatric:        Mood and Affect: Mood normal.        Behavior: Behavior normal.     Wt Readings from Last 3 Encounters:  01/18/22 154 lb (69.9 kg)  01/01/22 151 lb (68.5 kg)  12/11/21 146 lb 9.6 oz (66.5 kg)    BP 138/70   Pulse 82   Ht '4\' 5"'  (1.346 m)   Wt 154 lb (69.9 kg)   SpO2 95%   BMI 38.55 kg/m   Assessment and Plan: 1. Essential hypertension BP running higher than goal recently. Will increase to 10 mg daily. Continue home monitoring. Reinforced low sodium diet. - lisinopril (ZESTRIL) 10 MG tablet; Take 1 tablet (10 mg total) by mouth daily.  Dispense: 90 tablet; Refill: 1  2. Type II diabetes mellitus with complication (HCC) Continue metformin and diet control. Most recent A1C was excellent.    Partially dictated using Editor, commissioning. Any errors are unintentional.  Halina Maidens, MD [facility]  01/18/2022

## 2022-01-23 ENCOUNTER — Ambulatory Visit: Payer: Self-pay

## 2022-01-23 NOTE — Patient Instructions (Signed)
Visit Information  Thank you for taking time to visit with me today. Please don't hesitate to contact me if I can be of assistance to you.   Following are the goals we discussed today:   Goals Addressed               This Visit's Progress     COMPLETED: Diabetes Patient stated goal (pt-stated)        .CARE PLAN ENTRY (see longtitudinal plan of care for additional care plan information)  Objective:  Lab Results  Component Value Date   HGBA1C 7.1 (H) 01/16/2019   Lab Results  Component Value Date   CREATININE 0.66 01/16/2019   CREATININE 0.63 09/19/2018   CREATININE 0.77 04/21/2018   No results found for: EGFR  Current Barriers:  Knowledge Deficits related to medications used for management of diabetes  Case Manager Clinical Goal(s):  Over the next 90 days, patient will demonstrate improved adherence to prescribed treatment plan for diabetes self care/management as evidenced by fasting blood sugars being 80-130. Verbalize daily monitoring and recording of CBG within 90 days Verbalize adherence to ADA/ carb modified diet within the next 90 days Verbalize exercise 3 days/week Verbalize following portion control  Interventions:  Discussed plans with patient for ongoing care management follow up and provided patient with direct contact information for care management team Reviewed scheduled/upcoming provider appointments including: A1C blood draws RN sent educational material Exercise program activity booklet RN sent educational material on planning healthy meals to help with portion control  Patient Self Care Activities:  Patient verbalized motivation regarding increasing her physical activity by doing chair exercises daily while watching TV and increasing walking up to 2000 steps a day Patient verbalized portion control when eating and monitoring weight.   RN will follow up outreach within the month of October       RNCM: Effective Management of HTN        Care  Coordination Interventions:  BP Readings from Last 3 Encounters:  01/18/22 138/70  01/01/22 132/71  12/11/21 130/84    The patient has had her AWV Evaluation of current treatment plan related to hypertension self management and patient's adherence to plan as established by provider. The patient saw the pcp post ER visit and medication changes made. The patient states she has been taking her blood pressures and her daughter has been writing them down. Education on elevation of feet when sitting down and using compression socks to help with circulation. The patient states that she has swelling in her left ankle more than the right.  Provided education to patient re: stroke prevention, s/s of heart attack and stroke Reviewed medications with patient and discussed importance of compliance. The patient is compliant with Lisinopril 10 mg daily. This was a recent changes. Review of orthostatic hypotension and how to monitor for changes Counseled on the importance of exercise goals with target of 150 minutes per week Discussed plans with patient for ongoing care management follow up and provided patient with direct contact information for care management team Advised patient, providing education and rationale, to monitor blood pressure daily and record, calling PCP for findings outside established parameters. Patients blood pressure today was 123/71. She said the other day it was 173/92 and 118/84. Review of the goal of systolic pressure <509 and diastolic <32. The patient verbalized understanding  Reviewed scheduled/upcoming provider appointments including: 12-12-2021 with pcp Advised patient to discuss changes in blood pressures, heart health, or other chronic conditions with provider Provided education  on prescribed diet heart healthy/ADA diet. The patient states that she does use some salt but she has cut back. Discussed how it is important to monitor for foods with hidden salts.  Discussed complications  of poorly controlled blood pressure such as heart disease, stroke, circulatory complications, vision complications, kidney impairment, sexual dysfunction Screening for signs and symptoms of depression related to chronic disease state  Assessed social determinant of health barriers 01-24-2022: The patient agrees to regular outreaches with the [facility]. She has the number to call for changes or needs. Review of the goals of the program.  Active listening / Reflection utilized  Emotional Support Provided         Our next appointment is by telephone on 05-02-2022 at 10 am  Please call the care guide team at [phone] if you need to cancel or reschedule your appointment.   If you are experiencing a Mental Health or Circle or need someone to talk to, please call the Suicide and Crisis Lifeline: 988 call the [place] National Suicide Prevention Lifeline: 641-878-5848 or TTY: (913)461-4162 TTY 807-756-2864) to talk to a trained counselor call 1-800-273-TALK (toll free, 24 hour hotline)  The patient verbalized understanding of instructions, educational materials, and care plan provided today and DECLINED offer to receive copy of patient instructions, educational materials, and care plan.   Telephone follow up appointment with care management team member scheduled for: 05-02-2022 at 10 am  [place], MSN, [place] [facility] Mobile: [phone]

## 2022-01-23 NOTE — Patient Outreach (Signed)
Care Coordination   Initial Visit Note   01/23/2022 Name: Sharon York MRN: [id number] DOB: [birth year]  Sharon York is a 76 y.o. year old female who sees [name], MD for primary care. I spoke with  Sharon York by phone today.  What matters to the patients health and wellness today?  Maintaining a stable blood pressure    Goals Addressed               This Visit's Progress     COMPLETED: Diabetes Patient stated goal (pt-stated)        .CARE PLAN ENTRY (see longtitudinal plan of care for additional care plan information)  Objective:  Lab Results  Component Value Date   HGBA1C 7.1 (H) 01/16/2019   Lab Results  Component Value Date   CREATININE 0.66 01/16/2019   CREATININE 0.63 09/19/2018   CREATININE 0.77 04/21/2018   No results found for: EGFR  Current Barriers:  Knowledge Deficits related to medications used for management of diabetes  Case Manager Clinical Goal(s):  Over the next 90 days, patient will demonstrate improved adherence to prescribed treatment plan for diabetes self care/management as evidenced by fasting blood sugars being 80-130. Verbalize daily monitoring and recording of CBG within 90 days Verbalize adherence to ADA/ carb modified diet within the next 90 days Verbalize exercise 3 days/week Verbalize following portion control  Interventions:  Discussed plans with patient for ongoing care management follow up and provided patient with direct contact information for care management team Reviewed scheduled/upcoming provider appointments including: A1C blood draws RN sent educational material Exercise program activity booklet RN sent educational material on planning healthy meals to help with portion control  Patient Self Care Activities:  Patient verbalized motivation regarding increasing her physical activity by doing chair exercises daily while watching TV and increasing walking up to 2000 steps a day Patient verbalized portion  control when eating and monitoring weight.   RN will follow up outreach within the month of October       RNCM: Effective Management of HTN        Care Coordination Interventions:  BP Readings from Last 3 Encounters:  01/18/22 138/70  01/01/22 132/71  12/11/21 130/84    The patient has had her AWV Evaluation of current treatment plan related to hypertension self management and patient's adherence to plan as established by provider. The patient saw the pcp post ER visit and medication changes made. The patient states she has been taking her blood pressures and her daughter has been writing them down. Education on elevation of feet when sitting down and using compression socks to help with circulation. The patient states that she has swelling in her left ankle more than the right.  Provided education to patient re: stroke prevention, s/s of heart attack and stroke Reviewed medications with patient and discussed importance of compliance. The patient is compliant with Lisinopril 10 mg daily. This was a recent changes. Review of orthostatic hypotension and how to monitor for changes Counseled on the importance of exercise goals with target of 150 minutes per week Discussed plans with patient for ongoing care management follow up and provided patient with direct contact information for care management team Advised patient, providing education and rationale, to monitor blood pressure daily and record, calling PCP for findings outside established parameters. Patients blood pressure today was 123/71. She said the other day it was 173/92 and 118/84. Review of the goal of systolic pressure <902 and diastolic <40. The patient  verbalized understanding  Reviewed scheduled/upcoming provider appointments including: 12-12-2021 with pcp Advised patient to discuss changes in blood pressures, heart health, or other chronic conditions with provider Provided education on prescribed diet heart healthy/ADA diet. The  patient states that she does use some salt but she has cut back. Discussed how it is important to monitor for foods with hidden salts.  Discussed complications of poorly controlled blood pressure such as heart disease, stroke, circulatory complications, vision complications, kidney impairment, sexual dysfunction Screening for signs and symptoms of depression related to chronic disease state  Assessed social determinant of health barriers 01-24-2022: The patient agrees to regular outreaches with the [facility]. She has the number to call for changes or needs. Review of the goals of the program.  Active listening / Reflection utilized  Emotional Support Provided         SDOH assessments and interventions completed:  Yes  SDOH Interventions Today    Flowsheet Row Most Recent Value  SDOH Interventions   Utilities Interventions Intervention Not Indicated  Stress Interventions Intervention Not Indicated  Social Connections Interventions Intervention Not Indicated, Other (Comment)  [has large family, 5 children and has grands and great- grands]        Care Coordination Interventions Activated:  Yes  Care Coordination Interventions:  Yes, provided   Follow up plan: Follow up call scheduled for 05-02-2022 at 10 am    Encounter Outcome:  Pt. Visit Completed   Noreene Larsson RN, MSN, [place] [facility] Mobile: [phone]

## 2022-01-29 ENCOUNTER — Ambulatory Visit (INDEPENDENT_AMBULATORY_CARE_PROVIDER_SITE_OTHER): Payer: Medicare Other | Admitting: Urology

## 2022-01-29 DIAGNOSIS — R350 Frequency of micturition: Secondary | ICD-10-CM

## 2022-01-29 NOTE — Progress Notes (Signed)
PTNS  Session # monthly  Health & Social Factors: no change Caffeine: 0 Alcohol: 0 Daytime voids #per day: 4  Night-time voids #per night: 1 Urgency: mild Incontinence Episodes #per day: 0 Ankle used: right Treatment Setting: 3 Feeling/ Response: sensory Comments: Patient tolerated well  Performed By: Teressa Lower, CMA  Follow Up: 1 month

## 2022-02-21 ENCOUNTER — Ambulatory Visit (INDEPENDENT_AMBULATORY_CARE_PROVIDER_SITE_OTHER): Payer: Medicare Other | Admitting: Internal Medicine

## 2022-02-21 ENCOUNTER — Encounter: Payer: Self-pay | Admitting: Internal Medicine

## 2022-02-21 VITALS — BP 122/70 | HR 80 | Ht <= 58 in | Wt 155.0 lb

## 2022-02-21 DIAGNOSIS — I69354 Hemiplegia and hemiparesis following cerebral infarction affecting left non-dominant side: Secondary | ICD-10-CM | POA: Diagnosis not present

## 2022-02-21 DIAGNOSIS — Z23 Encounter for immunization: Secondary | ICD-10-CM | POA: Diagnosis not present

## 2022-02-21 NOTE — Progress Notes (Signed)
Date:  02/21/2022   Name:  Sharon York   DOB:  [birth year]   MRN:  [id number]   Chief Complaint: PT referral (Needs referral for [facility] to see her and order a knew brace) and Flu Vaccine  Extremity Weakness  The pain is present in the left ankle and left foot. This is a chronic problem. The problem occurs constantly. The patient is experiencing no pain. Associated symptoms comments: Foot drop from CVA. . Treatments tried: posterior splint but needs replacement or adjustment.    Lab Results  Component Value Date   NA 142 12/11/2021   K 4.6 12/11/2021   CO2 26 12/11/2021   GLUCOSE 106 (H) 12/11/2021   BUN 11 12/11/2021   CREATININE 0.62 12/11/2021   CALCIUM 9.4 12/11/2021   EGFR 91 12/11/2021   GFRNONAA 79 12/03/2019   Lab Results  Component Value Date   CHOL 152 12/11/2021   HDL 72 12/11/2021   LDLCALC 64 12/11/2021   TRIG 83 12/11/2021   CHOLHDL 2.1 12/11/2021   Lab Results  Component Value Date   TSH 3.420 12/11/2021   Lab Results  Component Value Date   HGBA1C 6.6 (H) 12/11/2021   Lab Results  Component Value Date   WBC 4.8 12/11/2021   HGB 12.0 12/11/2021   HCT 37.0 12/11/2021   MCV 71 (L) 12/11/2021   PLT 269 12/11/2021   Lab Results  Component Value Date   ALT 13 12/11/2021   AST 16 12/11/2021   ALKPHOS 51 12/11/2021   BILITOT <0.2 12/11/2021   Lab Results  Component Value Date   VD25OH 123.2 (H) 12/11/2021     Review of Systems  Constitutional:  Negative for fatigue.  Respiratory:  Negative for chest tightness and shortness of breath.   Cardiovascular:  Negative for chest pain.  Musculoskeletal:  Positive for extremity weakness and gait problem (but no falls).  Neurological:  Negative for dizziness and headaches.  Psychiatric/Behavioral:  Negative for dysphoric mood and sleep disturbance. The patient is not nervous/anxious.     Patient Active Problem List   Diagnosis Date Noted   Trigger finger, right middle finger 12/11/2021    Microalbuminuria due to type 2 diabetes mellitus ([place]) 12/07/2019   Type II diabetes mellitus with complication ([place]) 62/86/3817   Right shoulder pain 03/31/2018   Hyperlipidemia associated with type 2 diabetes mellitus ([place]) 06/12/2017   Tinea corporis 03/17/2015   Abnormal gait 12/15/2014   Altered bowel function 12/15/2014   Hemiparesis affecting left side as late effect of cerebrovascular accident (CVA) ([place]) 12/15/2014   Obstructive sleep apnea of adult 12/15/2014   Arthritis of shoulder region, 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     Past Surgical History:  Procedure Laterality Date   [place]   COLONOSCOPY  2010   normal    Social History   Tobacco Use   Smoking status: Never    Passive exposure: Never   Smokeless tobacco: Never   Tobacco comments:    smoking cessation materials not required  Vaping Use   Vaping Use: Never used  Substance Use Topics   Alcohol use: No    Alcohol/week: 0.0 standard drinks of alcohol  Drug use: Never     Medication list has been reviewed and updated.  Current Meds  Medication Sig   ACCU-CHEK GUIDE test strip USE 1 STRIP TO CHECK GLUCOSE 4 TIMES DAILY   Accu-Chek Softclix Lancets lancets USE TO CHECK GLUCOSE UP TO 4 TIMES DAILY   alendronate (FOSAMAX) 70 MG tablet TAKE 1 TABLET BY MOUTH ONCE A WEEK. TAKE WITH A FULL GLASS OF WATER ON AN EMPTY STOMACH.   aspirin 81 MG chewable tablet Chew 1 tablet by mouth daily.   atorvastatin (LIPITOR) 10 MG tablet Take 1 tablet (10 mg total) by mouth daily.   baclofen (LIORESAL) 10 MG tablet Take 1 tablet (10 mg total) by mouth 2 (two) times daily.   blood glucose meter kit and supplies  Dispense based on patient and insurance preference. Use up to four times daily as directed. (FOR ICD-10 E10.9, E11.9).   Glucose Blood Automatic (POGO AUTOMATIC TEST CARTRIDGES) TEST 1 Cartridge by In Vitro route 3 (three) times daily.   lisinopril (ZESTRIL) 10 MG tablet Take 1 tablet (10 mg total) by mouth daily.   metFORMIN (GLUCOPHAGE) 500 MG tablet TAKE 1 TABLET BY MOUTH ONCE DAILY WITH BREAKFAST -  REPLACES  250  MG  DOSE   Multiple Vitamins-Minerals (MULTIVITAMIN ADULT PO) Take by mouth.       01/18/2022    9:08 AM 12/11/2021    9:12 AM 08/16/2021    9:04 AM 05/26/2021    3:12 PM  GAD 7 : Generalized Anxiety Score  Nervous, Anxious, on Edge 0 0 0 0  Control/stop worrying 0 0 0 0  Worry too much - different things 0 0 0 0  Trouble relaxing 0 0 0 0  Restless 0 0 0 0  Easily annoyed or irritable 0 0 0 0  Afraid - awful might happen 0 0 0 0  Total GAD 7 Score 0 0 0 0  Anxiety Difficulty Not difficult at all  Not difficult at all Not difficult at all       01/18/2022    9:08 AM 12/11/2021    9:12 AM 08/16/2021    9:04 AM  Depression screen PHQ 2/9  Decreased Interest 0 0 0  Down, Depressed, Hopeless 0 0 0  PHQ - 2 Score 0 0 0  Altered sleeping 0 0 0  Tired, decreased energy 0 0 0  Change in appetite 0 0 0  Feeling bad or failure about yourself  0 0 0  Trouble concentrating 0 0 0  Moving slowly or fidgety/restless 0 0 0  Suicidal thoughts 0 0 0  PHQ-9 Score 0 0 0  Difficult doing work/chores Not difficult at all Not difficult at all Not difficult at all    BP Readings from Last 3 Encounters:  02/21/22 122/70  01/18/22 138/70  01/01/22 132/71    Physical Exam Vitals and nursing note reviewed.  Constitutional:      General: She is not in acute distress.    Appearance: She is well-developed.  HENT:     Head: Normocephalic and atraumatic.  Cardiovascular:     Rate and Rhythm: Normal rate and regular rhythm.  Pulmonary:     Effort: Pulmonary effort is normal. No  respiratory distress.     Breath sounds: No wheezing or rhonchi.  Musculoskeletal:     Comments: Foot drop on left - splint is not keeping the foot parallel and the attachments and velcro are worn and insufficient  Skin:    General: Skin  is warm and dry.     Findings: No rash.  Neurological:     Mental Status: She is alert and oriented to person, place, and time.  Psychiatric:        Mood and Affect: Mood normal.        Behavior: Behavior normal.     Wt Readings from Last 3 Encounters:  02/21/22 155 lb (70.3 kg)  01/18/22 154 lb (69.9 kg)  01/01/22 151 lb (68.5 kg)    BP 122/70   Pulse 80   Ht '4\' 5"'  (1.346 m)   Wt 155 lb (70.3 kg)   SpO2 94%   BMI 38.80 kg/m   Assessment and Plan: 1. Hemiparesis affecting left side as late effect of cerebrovascular accident (CVA) [facility]) Referral placed; so far she is able to ambulate with her cane or walker. - OT prosthetic management   Partially dictated using Editor, commissioning. Any errors are unintentional.  Halina Maidens, MD [facility]  02/21/2022

## 2022-02-24 ENCOUNTER — Other Ambulatory Visit: Payer: Self-pay | Admitting: Internal Medicine

## 2022-02-26 ENCOUNTER — Other Ambulatory Visit: Payer: Self-pay

## 2022-02-26 ENCOUNTER — Ambulatory Visit: Payer: Medicare Other | Admitting: Urology

## 2022-02-26 NOTE — Telephone Encounter (Signed)
Requested medication (s) are due for refill today: yes  Requested medication (s) are on the active medication list: yes  Last refill:  12/11/21  Future visit scheduled:yes  Notes to clinic:  Unable to refill per protocol, last refill by another provider.      Requested Prescriptions  Pending Prescriptions Disp Refills   ACCU-CHEK GUIDE test strip [Pharmacy Med Name: Accu-Chek Guide In Vitro Strip] 100 each 0    Sig: USE 1 STRIP TO CHECK GLUCOSE 4 TIMES DAILY     Endocrinology: Diabetes - Testing Supplies Passed - 02/24/2022  9:39 AM      Passed - Valid encounter within last 12 months    Recent Outpatient Visits           5 days ago Hemiparesis affecting left side as late effect of cerebrovascular accident (CVA) ([place])   [place] [facility] at [facility], Jesse Sans, MD   1 month ago Essential hypertension   [place] [facility] at [facility], Jesse Sans, MD   2 months ago Annual physical exam   [place] [facility] at [facility], Jesse Sans, MD   3 months ago Bilateral leg edema   [place] [facility] at [facility], Jesse Sans, MD   6 months ago Type II diabetes mellitus with complication [facility])   [place] [facility] at [facility], Jesse Sans, MD       Future Appointments             In 1 month Army Melia, Jesse Sans, MD [facility] at [facility]   In 9 months Army Melia, Jesse Sans, MD [place] [facility] at [facility]

## 2022-03-01 DIAGNOSIS — E119 Type 2 diabetes mellitus without complications: Secondary | ICD-10-CM | POA: Diagnosis not present

## 2022-03-01 DIAGNOSIS — H25813 Combined forms of age-related cataract, bilateral: Secondary | ICD-10-CM | POA: Diagnosis not present

## 2022-03-01 LAB — HM DIABETES EYE EXAM

## 2022-03-05 ENCOUNTER — Telehealth: Payer: Self-pay | Admitting: Internal Medicine

## 2022-03-05 NOTE — Telephone Encounter (Signed)
Called pt could not call back. VM not set up.  KP

## 2022-03-05 NOTE — Telephone Encounter (Signed)
Copied from [place] [id number]. Topic: General - Call Back - No Documentation >> Mar 05, 2022  2:33 PM Sabas Sous wrote: Reason for CRM: Wants call back to discuss upcoming appt in December with Dr. Gerilyn Nestle at the [facility] in [place] contact: [phone]

## 2022-03-06 NOTE — Telephone Encounter (Signed)
Called, left vm to call back. 

## 2022-03-12 ENCOUNTER — Telehealth: Payer: Self-pay | Admitting: Internal Medicine

## 2022-03-12 NOTE — Telephone Encounter (Signed)
Copied from [place] [id number]. Topic: General - Other >> Mar 12, 2022 10:42 AM [place] C wrote: Reason for CRM: The patient has called to follow up with a member of clinical staff related to their request for a new leg brace  The patient has been directed by [facility] to follow up with their PCP   [facility] can be reached at [phone]   Please contact the patient when possible to confirm the prescription

## 2022-03-13 ENCOUNTER — Telehealth: Payer: Self-pay | Admitting: Internal Medicine

## 2022-03-13 NOTE — Telephone Encounter (Signed)
Copied from [place] [id number]. Topic: General - Other >> Mar 13, 2022 10:46 AM [place] C wrote: Reason for CRM: Rosann Auerbach with [facility] has called to request that the recently received prescription for the patient include the correct name  Left posterior splint prescription includes the patient's name as [name] rather than Sharon York would like recently submitted prescription to be corrected and resubmitted for the patient with a correct name

## 2022-03-13 NOTE — Telephone Encounter (Signed)
Faxed prescription and demographics to [facility]

## 2022-03-13 NOTE — Telephone Encounter (Signed)
Resent with correct name by CM.  KP

## 2022-03-15 ENCOUNTER — Ambulatory Visit
Admission: RE | Admit: 2022-03-15 | Discharge: 2022-03-15 | Disposition: A | Discharge status: Home / Self Care | Payer: Medicare Other | Source: Ambulatory Visit | Attending: Internal Medicine | Admitting: Internal Medicine

## 2022-03-15 DIAGNOSIS — Z1231 Encounter for screening mammogram for malignant neoplasm of breast: Secondary | ICD-10-CM | POA: Diagnosis not present

## 2022-03-16 ENCOUNTER — Ambulatory Visit (INDEPENDENT_AMBULATORY_CARE_PROVIDER_SITE_OTHER): Payer: Medicare Other | Admitting: Internal Medicine

## 2022-03-16 ENCOUNTER — Encounter: Payer: Self-pay | Admitting: Internal Medicine

## 2022-03-16 VITALS — BP 140/66 | HR 85 | Ht <= 58 in | Wt 155.0 lb

## 2022-03-16 DIAGNOSIS — E118 Type 2 diabetes mellitus with unspecified complications: Secondary | ICD-10-CM | POA: Diagnosis not present

## 2022-03-16 DIAGNOSIS — H6123 Impacted cerumen, bilateral: Secondary | ICD-10-CM | POA: Diagnosis not present

## 2022-03-16 DIAGNOSIS — I69354 Hemiplegia and hemiparesis following cerebral infarction affecting left non-dominant side: Secondary | ICD-10-CM | POA: Diagnosis not present

## 2022-03-16 DIAGNOSIS — Z23 Encounter for immunization: Secondary | ICD-10-CM | POA: Diagnosis not present

## 2022-03-16 DIAGNOSIS — H903 Sensorineural hearing loss, bilateral: Secondary | ICD-10-CM | POA: Diagnosis not present

## 2022-03-16 MED ORDER — POGO AUTOMATIC TEST CARTRIDGES VI TEST: 1.0000 | Freq: Three times a day (TID) | 2 refills | Status: DC

## 2022-03-16 MED ORDER — ACCU-CHEK SOFTCLIX LANCETS MISC: 4 refills | Status: DC

## 2022-03-16 NOTE — Progress Notes (Signed)
Date:  03/16/2022   Name:  Sharon York   DOB:  January 08, 1946   MRN:  [id number]   Chief Complaint: Follow-up (Patient would like to get a wheel chair lift to help with transportation. Patient has electric wheelchair. Uses [facility]. )  Diabetes She presents for her follow-up diabetic visit. She has type 2 diabetes mellitus. Her disease course has been stable. Pertinent negatives for hypoglycemia include no dizziness. Associated symptoms include weakness. Pertinent negatives for diabetes include no chest pain and no fatigue. Pertinent negatives for diabetic complications include no CVA.  Hypertension This is a chronic problem. The problem is controlled. Pertinent negatives include no chest pain or shortness of breath. Past treatments include ACE inhibitors. There is no history of kidney disease, CAD/MI or CVA.  CVA - she has a scooter that she uses at home but is having more trouble ambulating in the community and wants a wheelchair lift for her vehicle.  She already has a ramp so she will be able to be more independent.  Lab Results  Component Value Date   NA 142 12/11/2021   K 4.6 12/11/2021   CO2 26 12/11/2021   GLUCOSE 106 (H) 12/11/2021   BUN 11 12/11/2021   CREATININE 0.62 12/11/2021   CALCIUM 9.4 12/11/2021   EGFR 91 12/11/2021   GFRNONAA 79 12/03/2019   Lab Results  Component Value Date   CHOL 152 12/11/2021   HDL 72 12/11/2021   LDLCALC 64 12/11/2021   TRIG 83 12/11/2021   CHOLHDL 2.1 12/11/2021   Lab Results  Component Value Date   TSH 3.420 12/11/2021   Lab Results  Component Value Date   HGBA1C 6.6 (H) 12/11/2021   Lab Results  Component Value Date   WBC 4.8 12/11/2021   HGB 12.0 12/11/2021   HCT 37.0 12/11/2021   MCV 71 (L) 12/11/2021   PLT 269 12/11/2021   Lab Results  Component Value Date   ALT 13 12/11/2021   AST 16 12/11/2021   ALKPHOS 51 12/11/2021   BILITOT <0.2 12/11/2021   Lab Results  Component Value Date   VD25OH 123.2 (H)  12/11/2021     Review of Systems  Constitutional:  Negative for chills and fatigue.  Respiratory:  Negative for chest tightness and shortness of breath.   Cardiovascular:  Negative for chest pain.  Musculoskeletal:  Positive for gait problem.  Neurological:  Positive for weakness. Negative for dizziness.    Patient Active Problem List   Diagnosis Date Noted   Trigger finger, right middle finger 12/11/2021   Microalbuminuria due to type 2 diabetes mellitus ([place]) 12/07/2019   Type II diabetes mellitus with complication ([place]) 77/82/4235   Right shoulder pain 03/31/2018   Hyperlipidemia associated with type 2 diabetes mellitus ([place]) 06/12/2017   Tinea corporis 03/17/2015   Abnormal gait 12/15/2014   Altered bowel function 12/15/2014   Hemiparesis affecting left side as late effect of cerebrovascular accident (CVA) ([place]) 12/15/2014   Obstructive sleep apnea of adult 12/15/2014   Arthritis of shoulder region, 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     Past Surgical History:  Procedure Laterality Date   [place]   COLONOSCOPY  2010  normal    Social History   Tobacco Use   Smoking status: Never    Passive exposure: Never   Smokeless tobacco: Never   Tobacco comments:    smoking cessation materials not required  Vaping Use   Vaping Use: Never used  Substance Use Topics   Alcohol use: No    Alcohol/week: 0.0 standard drinks of alcohol   Drug use: Never     Medication list has been reviewed and updated.  Current Meds  Medication Sig   alendronate (FOSAMAX) 70 MG tablet TAKE 1 TABLET BY MOUTH ONCE A WEEK. TAKE WITH A FULL GLASS OF WATER ON AN EMPTY STOMACH.   aspirin 81 MG  chewable tablet Chew 1 tablet by mouth daily.   atorvastatin (LIPITOR) 10 MG tablet Take 1 tablet (10 mg total) by mouth daily.   baclofen (LIORESAL) 10 MG tablet Take 1 tablet (10 mg total) by mouth 2 (two) times daily.   blood glucose meter kit and supplies Dispense based on patient and insurance preference. Use up to four times daily as directed. (FOR ICD-10 E10.9, E11.9).   glucose blood (ACCU-CHEK GUIDE) test strip USE 1 STRIP TO CHECK GLUCOSE 4 TIMES DAILY   lisinopril (ZESTRIL) 10 MG tablet Take 1 tablet (10 mg total) by mouth daily.   metFORMIN (GLUCOPHAGE) 500 MG tablet TAKE 1 TABLET BY MOUTH ONCE DAILY WITH BREAKFAST -  REPLACES  250  MG  DOSE   Multiple Vitamins-Minerals (MULTIVITAMIN ADULT PO) Take by mouth.   [DISCONTINUED] Accu-Chek Softclix Lancets lancets USE TO CHECK GLUCOSE UP TO 4 TIMES DAILY   [DISCONTINUED] Glucose Blood Automatic (POGO AUTOMATIC TEST CARTRIDGES) TEST 1 Cartridge by In Vitro route 3 (three) times daily.       03/16/2022    3:51 PM 01/18/2022    9:08 AM 12/11/2021    9:12 AM 08/16/2021    9:04 AM  GAD 7 : Generalized Anxiety Score  Nervous, Anxious, on Edge 0 0 0 0  Control/stop worrying 0 0 0 0  Worry too much - different things 0 0 0 0  Trouble relaxing 0 0 0 0  Restless 0 0 0 0  Easily annoyed or irritable 0 0 0 0  Afraid - awful might happen 0 0 0 0  Total GAD 7 Score 0 0 0 0  Anxiety Difficulty Not difficult at all Not difficult at all  Not difficult at all       03/16/2022    3:51 PM 01/18/2022    9:08 AM 12/11/2021    9:12 AM  Depression screen PHQ 2/9  Decreased Interest 0 0 0  Down, Depressed, Hopeless 0 0 0  PHQ - 2 Score 0 0 0  Altered sleeping 0 0 0  Tired, decreased energy 0 0 0  Change in appetite 0 0 0  Feeling bad or failure about yourself  0 0 0  Trouble concentrating 0 0 0  Moving slowly or fidgety/restless 0 0 0  Suicidal thoughts 0 0 0  PHQ-9 Score 0 0 0  Difficult doing work/chores Not difficult at all Not difficult  at all Not difficult at all    BP Readings from Last 3 Encounters:  03/16/22 (!) 140/66  02/21/22 122/70  01/18/22 138/70    Physical Exam Cardiovascular:     Rate and Rhythm: Normal rate and regular rhythm.  Pulmonary:     Effort: Pulmonary effort is normal.     Breath sounds: No wheezing or rhonchi.  Musculoskeletal:  Cervical back: Normal range of motion.  Neurological:     Mental Status: She is alert. Mental status is at baseline.     Motor: Weakness present.     Gait: Gait abnormal.     Wt Readings from Last 3 Encounters:  03/16/22 155 lb (70.3 kg)  02/21/22 155 lb (70.3 kg)  01/18/22 154 lb (69.9 kg)    BP (!) 140/66 (BP Location: Right Arm, Cuff Size: Large)   Pulse 85   Ht _0  (1.346 m)   Wt 155 lb (70.3 kg)   SpO2 95%   BMI 38.80 kg/m   Assessment and Plan: 1. Hemiparesis affecting left side as late effect of cerebrovascular accident (CVA) [facility]) Rx written for a wheelchair lift.  She is waiting for a list of companies from her insurance and will then contact one to start the process.  She is advised they will likely need to send me forms to complete.  2. Type II diabetes mellitus with complication ([place]) Clinically stable by exam and report without s/s of hypoglycemia. DM complicated by hypertension and dyslipidemia. Tolerating medications well without side effects or other concerns. - Glucose Blood Automatic (POGO AUTOMATIC TEST CARTRIDGES) TEST; 1 Cartridge by In Vitro route 3 (three) times daily.  Dispense: 50 each; Refill: 2 - Accu-Chek Softclix Lancets lancets; USE TO CHECK GLUCOSE UP TO 4 TIMES DAILY  Dispense: 100 each; Refill: 4   Partially dictated using Editor, commissioning. Any errors are unintentional.  Halina Maidens, MD [facility]  03/16/2022

## 2022-03-19 ENCOUNTER — Telehealth: Payer: Self-pay

## 2022-03-19 NOTE — Telephone Encounter (Signed)
POGO Automatic Test Cartridges   PA completed waiting on insurance approval.  Key: [id number]

## 2022-03-22 DIAGNOSIS — H2513 Age-related nuclear cataract, bilateral: Secondary | ICD-10-CM | POA: Diagnosis not present

## 2022-03-26 NOTE — Telephone Encounter (Signed)
Denied  KP 

## 2022-03-26 NOTE — Telephone Encounter (Signed)
Called pt could not leave VM to call back phone kept ringing. When completing the PA this question was asked:  Does the patient have at least one of the following reasons or special circumstances that a preferred Test Strip/Glucometer cannot be used: 1. The patient has a vision problem/blindness that requires the use of a special glucometer/test strip; 2. The patient is currently on an insulin pump that requires a specific glucometer/test strip; 3. There is a medically necessary justification (for example mental or physical limitation) why the patient needs to remain on their current glucometer/test strip?  The patient has a vision problem/blindness that requires the use of a special glucometer/test strip  This was selected-There is a medically necessary justification (for example mental or physical limitation) why the patient needs to remain on their current glucometer/test strip.  Was still denied by insurance.  KP

## 2022-03-26 NOTE — Telephone Encounter (Signed)
Will someone reach out to [place] re these [employer]? She is very upset that they were denied again. 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 Therefore with the traditional she is always poking the same one or two fingers, she tried to get the arm reader but it was denied also. Would consideration be made if they realize she is handicapped? Pls reach out to pt at [phone] as seems very depressed about this.

## 2022-03-27 ENCOUNTER — Ambulatory Visit: Payer: Self-pay

## 2022-03-27 NOTE — Telephone Encounter (Signed)
Called pt phone was busy.  KP

## 2022-03-27 NOTE — Telephone Encounter (Signed)
Attempted to call patient to make her aware of PA response from insurance carrier- left message to call office for message.

## 2022-03-27 NOTE — Telephone Encounter (Signed)
Patient called, left VM to return the call to the office to discuss symptoms with a nurse.  Summary: stomach concern / rx req   The patient has experienced stomach irritation and constipation for roughly three days  The patient would like to be prescribed something for their stomach concerns  The patient is able to urinate but shares that and has tried mirolax but it is ineffective  Please contact further when possible

## 2022-03-27 NOTE — Telephone Encounter (Signed)
Message from [employer] sent at 03/27/2022 11:02 AM EST  Summary: stomach concern / rx req   The patient has experienced stomach irritation and constipation for roughly three days  The patient would like to be prescribed something for their stomach concerns  The patient is able to urinate but shares that and has tried mirolax but it is ineffective  Please contact further when possible        Called pt and LM on VM to call back to discuss sx.

## 2022-03-27 NOTE — Telephone Encounter (Signed)
Reason for Disposition . Unable to have a bowel movement (BM) without laxative or enema  Answer Assessment - Initial Assessment Questions 1. STOOL PATTERN OR FREQUENCY: "How often do you have a bowel movement (BM)?"  (Normal range: 3 times a day to every 3 days)  "When was your last BM?"       Every day  2. STRAINING: "Do you have to strain to have a BM?"      Yes lately  3. RECTAL PAIN: "Does your rectum hurt when the stool comes out?" If Yes, ask: "Do you have hemorrhoids? How bad is the pain?"  (Scale 1-10; or mild, moderate, severe)     No  4. STOOL COMPOSITION: "Are the stools hard?"      Hard  5. BLOOD ON STOOLS: "Has there been any blood on the toilet tissue or on the surface of the BM?" If Yes, ask: "When was the last time?"     No  6. CHRONIC CONSTIPATION: "Is this a new problem for you?"  If No, ask: "How long have you had this problem?" (days, weeks, months)      3 days  7. CHANGES IN DIET OR HYDRATION: "Have there been any recent changes in your diet?" "How much fluids are you drinking on a daily basis?"  "How much have you had to drink today?"     Drinking a lot of water.  8. MEDICINES: "Have you been taking any new medicines?" "Are you taking any narcotic pain medicines?" (e.g., Dilaudid, morphine, Percocet, Vicodin)     No but some medications does cause constipation  9. LAXATIVES: "Have you been using any stool softeners, laxatives, or enemas?"  If Yes, ask "What, how often, and when was the last time?"     Miralax .  10. ACTIVITY:  "How much walking do you do every day?"  "Has your activity level decreased in the past week?"        Walk 700 steps  and walks approx 1 hour at times. 11. CAUSE: "What do you think is causing the constipation?"        Not sure  12. OTHER SYMPTOMS: "Do you have any other symptoms?" (e.g., abdomen pain, bloating, fever, vomiting)       No  13. MEDICAL HISTORY: "Do you have a history of hemorrhoids, rectal fissures, or rectal surgery or rectal  abscess?"         na 14. PREGNANCY: "Is there any chance you are pregnant?" "When was your last menstrual period?"       na  Protocols used: Constipation-A-AH

## 2022-03-27 NOTE — Telephone Encounter (Signed)
  Chief Complaint: constipation x 3 days requesting medication other than miralax Symptoms: hard stool. No BM in 3 days  Frequency: 3 days  Pertinent Negatives: Patient denies abdominal pain no bloating. No diarrhea. No hemorrhoids  Disposition: [] ED /[] Urgent Care (no appt availability in office) / [] Appointment(In office/virtual)/ []  [place] [facility]/ [] Home Care/ [] Refused Recommended Disposition /[] [place] [facility]/ [x]  Follow-up with PCP Additional Notes:   Please advise if appt needed. Patient requesting to get medication for constipation .    Reason for Disposition  Unable to have a bowel movement (BM) without laxative or enema  Answer Assessment - Initial Assessment Questions 1. STOOL PATTERN OR FREQUENCY: "How often do you have a bowel movement (BM)?"  (Normal range: 3 times a day to every 3 days)  "When was your last BM?"       Every day  2. STRAINING: "Do you have to strain to have a BM?"      Yes lately  3. RECTAL PAIN: "Does your rectum hurt when the stool comes out?" If Yes, ask: "Do you have hemorrhoids? How bad is the pain?"  (Scale 1-10; or mild, moderate, severe)     No  4. STOOL COMPOSITION: "Are the stools hard?"      Hard  5. BLOOD ON STOOLS: "Has there been any blood on the toilet tissue or on the surface of the BM?" If Yes, ask: "When was the last time?"     No  6. CHRONIC CONSTIPATION: "Is this a new problem for you?"  If No, ask: "How long have you had this problem?" (days, weeks, months)      3 days  7. CHANGES IN DIET OR HYDRATION: "Have there been any recent changes in your diet?" "How much fluids are you drinking on a daily basis?"  "How much have you had to drink today?"     Drinking a lot of water.  8. MEDICINES: "Have you been taking any new medicines?" "Are you taking any narcotic pain medicines?" (e.g., Dilaudid, morphine, Percocet, Vicodin)     No but some medications does cause constipation  9. LAXATIVES: "Have you been using any stool  softeners, laxatives, or enemas?"  If Yes, ask "What, how often, and when was the last time?"     Miralax .  10. ACTIVITY:  "How much walking do you do every day?"  "Has your activity level decreased in the past week?"        Walk 700 steps  and walks approx 1 hour at times. 11. CAUSE: "What do you think is causing the constipation?"        Not sure  12. OTHER SYMPTOMS: "Do you have any other symptoms?" (e.g., abdomen pain, bloating, fever, vomiting)       No  13. MEDICAL HISTORY: "Do you have a history of hemorrhoids, rectal fissures, or rectal surgery or rectal abscess?"         na

## 2022-03-27 NOTE — Telephone Encounter (Signed)
3rd attempt, pt called, LVMTCB to discuss sx.

## 2022-04-05 ENCOUNTER — Telehealth: Payer: Self-pay | Admitting: Internal Medicine

## 2022-04-05 DIAGNOSIS — E118 Type 2 diabetes mellitus with unspecified complications: Secondary | ICD-10-CM

## 2022-04-05 NOTE — Telephone Encounter (Signed)
Unable to refill per protocol, Rx request is too soon. Last refill was 03/06/22 for 1000 and 4RF. Will refuse.  Requested Prescriptions  Pending Prescriptions Disp Refills   Accu-Chek Softclix Lancets lancets 100 each 4    Sig: USE TO CHECK GLUCOSE UP TO 4 TIMES DAILY     Endocrinology: Diabetes - Testing Supplies Passed - 04/05/2022  2:24 PM      Passed - Valid encounter within last 12 months    Recent Outpatient Visits           2 weeks ago Hemiparesis affecting left side as late effect of cerebrovascular accident (CVA) (HCC)   [place] [facility] at [facility], Nyoka Cowden, MD   1 month ago Hemiparesis affecting left side as late effect of cerebrovascular accident (CVA) [facility])   [place] [facility] Medicine at [facility], Nyoka Cowden, MD   2 months ago Essential hypertension   [place] [facility] and Sports Medicine at [facility], Nyoka Cowden, MD   3 months ago Annual physical exam   [facility] at [facility], Nyoka Cowden, MD   4 months ago Bilateral leg edema    Primary Care and Sports Medicine at [facility], Nyoka Cowden, MD       Future Appointments             In 2 weeks Judithann Patricelli, Nyoka Cowden, MD [facility] and [facility] at [facility], [facility]   In 8 months Judithann Pocius, Nyoka Cowden, MD [facility] Primary Care and Sports Medicine at [facility], [facility]

## 2022-04-05 NOTE — Telephone Encounter (Signed)
Medication Refill - Medication: soft click pen needle  Has the patient contacted their pharmacy? No. (Agent: If no, request that the patient contact the pharmacy for the refill. If patient does not wish to contact the pharmacy document the reason why and proceed with request.) (Agent: If yes, when and what did the pharmacy advise?)  Preferred Pharmacy (with phone number or street name):  [facility] [address] [place] - [address] Phone: [phone]  Fax: [phone]     Has the patient been seen for an appointment in the last year OR does the patient have an upcoming appointment? Yes.    Pt requesting a cb to discuss request

## 2022-04-06 ENCOUNTER — Other Ambulatory Visit: Payer: Self-pay | Admitting: Internal Medicine

## 2022-04-06 DIAGNOSIS — M810 Age-related osteoporosis without current pathological fracture: Secondary | ICD-10-CM

## 2022-04-06 NOTE — Telephone Encounter (Signed)
Requested Prescriptions  Pending Prescriptions Disp Refills   alendronate (FOSAMAX) 70 MG tablet [Pharmacy Med Name: Alendronate Sodium 70 MG Oral Tablet] 12 tablet 0    Sig: TAKE 1 TABLET BY MOUTH ONCE A WEEK. TAKE WITH A FULL GLASS OF WATER ON AN EMPTY STOMACH     Endocrinology:  Bisphosphonates Failed - 04/06/2022  3:14 PM      Failed - Vitamin D in normal range and within 360 days    Vit D, 25-Hydroxy  Date Value Ref Range Status  12/11/2021 123.2 (H) 30.0 - 100.0 ng/mL Final    Comment:    **Results verified by repeat testing** Vitamin D deficiency has been defined by the Institute of Medicine and an Endocrine Society practice guideline as a level of serum 25-OH vitamin D less than 20 ng/mL (1,2). The Endocrine Society went on to further define vitamin D insufficiency as a level between 21 and 29 ng/mL (2). 1. IOM (Institute of Medicine). 2010. Dietary reference    intakes for calcium and D. [place]: The    [employer]. 2. Holick MF, Binkley [place], Bischoff-Ferrari HA, et al.    Evaluation, treatment, and prevention of vitamin D    deficiency: an Endocrine Society clinical practice    guideline. JCEM. 2011 Jul; 96(7):1911-30.          Failed - Mg Level in normal range and within 360 days    No results found for: "MG"       Failed - Phosphate in normal range and within 360 days    No results found for: "PHOS"       Passed - Ca in normal range and within 360 days    Calcium  Date Value Ref Range Status  12/11/2021 9.4 8.7 - 10.3 mg/dL Final         Passed - Cr in normal range and within 360 days    Creatinine, Ser  Date Value Ref Range Status  12/11/2021 0.62 0.57 - 1.00 mg/dL Final         Passed - eGFR is 30 or above and within 360 days    GFR calc Af Amer  Date Value Ref Range Status  12/03/2019 91 >59 mL/min/1.73 Final    Comment:    **[facility] currently reports eGFR in compliance with the current**   recommendations of the [employer]. [facility] will   update reporting as new guidelines are published from the NKF-ASN   Task force.    GFR calc non Af Amer  Date Value Ref Range Status  12/03/2019 79 >59 mL/min/1.73 Final   eGFR  Date Value Ref Range Status  12/11/2021 91 >59 mL/min/1.73 Final         Passed - Valid encounter within last 12 months    Recent Outpatient Visits           3 weeks ago Hemiparesis affecting left side as late effect of cerebrovascular accident (CVA) ([place])   [place] [facility] Medicine at [facility], Jesse Sans, MD   1 month ago Hemiparesis affecting left side as late effect of cerebrovascular accident (CVA) [facility])   [place] [facility] and Sports Medicine at [facility], Jesse Sans, MD   2 months ago Essential hypertension   [place] [facility] and Sports Medicine at [facility], Jesse Sans, MD   3 months ago Annual physical exam   [facility] Primary Care and [facility] Medicine at [facility]  [name], MD   4 months ago Bilateral leg edema   [place] [facility] Care and Sports Medicine at [facility], Jesse Sans, MD       Future Appointments             In 2 weeks Army Melia Jesse Sans, MD [facility] Primary Care and Sports Medicine at [facility], [facility]   In 8 months Army Melia Jesse Sans, MD [facility] Primary Care and Sports Medicine at [facility], [place] or Dexa Scan completed in the last 2 years

## 2022-04-06 NOTE — Telephone Encounter (Signed)
Pt called in to follow up. Pt says that she actually need the pin needles instead of Lancets. Pt would like to have sent in to pharmacy as soon as possible.   Pt says that she is out. Pt was unable to check her levels due to being out.    Pharmacy:  [facility] [address], [place] - [address] Phone: [phone]  Fax: [phone]

## 2022-04-06 NOTE — Telephone Encounter (Signed)
Called pt for clarification. Could not leave VM to call back.  KP

## 2022-04-17 ENCOUNTER — Ambulatory Visit: Payer: Medicare Other | Admitting: Internal Medicine

## 2022-04-23 ENCOUNTER — Encounter: Payer: Self-pay | Admitting: Internal Medicine

## 2022-04-23 ENCOUNTER — Ambulatory Visit: Payer: Medicare Other | Admitting: Internal Medicine

## 2022-04-23 NOTE — Progress Notes (Deleted)
Date:  04/23/2022   Name:  Sharon York   DOB:  Jun 14, 1945   MRN:  [id number]   Chief Complaint: No chief complaint on file.  Diabetes She presents for her follow-up diabetic visit. She has type 2 diabetes mellitus. Her disease course has been stable. Pertinent negatives for hypoglycemia include no headaches or tremors. Pertinent negatives for diabetes include no chest pain, no fatigue, no polydipsia and no polyuria. Current diabetic treatment includes oral agent (monotherapy) (metformin). She is compliant with treatment all of the time. An ACE inhibitor/angiotensin II receptor blocker is being taken.    Lab Results  Component Value Date   NA 142 12/11/2021   K 4.6 12/11/2021   CO2 26 12/11/2021   GLUCOSE 106 (H) 12/11/2021   BUN 11 12/11/2021   CREATININE 0.62 12/11/2021   CALCIUM 9.4 12/11/2021   EGFR 91 12/11/2021   GFRNONAA 79 12/03/2019   Lab Results  Component Value Date   CHOL 152 12/11/2021   HDL 72 12/11/2021   LDLCALC 64 12/11/2021   TRIG 83 12/11/2021   CHOLHDL 2.1 12/11/2021   Lab Results  Component Value Date   TSH 3.420 12/11/2021   Lab Results  Component Value Date   HGBA1C 6.6 (H) 12/11/2021   Lab Results  Component Value Date   WBC 4.8 12/11/2021   HGB 12.0 12/11/2021   HCT 37.0 12/11/2021   MCV 71 (L) 12/11/2021   PLT 269 12/11/2021   Lab Results  Component Value Date   ALT 13 12/11/2021   AST 16 12/11/2021   ALKPHOS 51 12/11/2021   BILITOT <0.2 12/11/2021   Lab Results  Component Value Date   VD25OH 123.2 (H) 12/11/2021     Review of Systems  Constitutional:  Negative for appetite change, fatigue, fever and unexpected weight change.  HENT:  Negative for tinnitus and trouble swallowing.   Eyes:  Negative for visual disturbance.  Respiratory:  Negative for cough, chest tightness and shortness of breath.   Cardiovascular:  Negative for chest pain, palpitations and leg swelling.  Gastrointestinal:  Negative for abdominal pain.   Endocrine: Negative for polydipsia and polyuria.  Genitourinary:  Negative for dysuria and hematuria.  Musculoskeletal:  Negative for arthralgias.  Neurological:  Negative for tremors, numbness and headaches.  Psychiatric/Behavioral:  Negative for dysphoric mood.     Patient Active Problem List   Diagnosis Date Noted   Trigger finger, right middle finger 12/11/2021   Microalbuminuria due to type 2 diabetes mellitus ([place]) 12/07/2019   Type II diabetes mellitus with complication ([place]) 47/01/6282   Right shoulder pain 03/31/2018   Hyperlipidemia associated with type 2 diabetes mellitus ([place]) 06/12/2017   Tinea corporis 03/17/2015   Abnormal gait 12/15/2014   Altered bowel function 12/15/2014   Hemiparesis affecting left side as late effect of cerebrovascular accident (CVA) ([place]) 12/15/2014   Obstructive sleep apnea of adult 12/15/2014   Arthritis of shoulder region, 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     Past Surgical History:  Procedure Laterality Date   [place]   COLONOSCOPY  2010   normal    Social History   Tobacco Use   Smoking status: Never  Passive exposure: Never   Smokeless tobacco: Never   Tobacco comments:    smoking cessation materials not required  Vaping Use   Vaping Use: Never used  Substance Use Topics   Alcohol use: No    Alcohol/week: 0.0 standard drinks of alcohol   Drug use: Never     Medication list has been reviewed and updated.  No outpatient medications have been marked as taking for the 04/23/22 encounter (Appointment) with [name], MD.       03/16/2022    3:51 PM 01/18/2022    9:08 AM 12/11/2021    9:12 AM 08/16/2021    9:04  AM  GAD 7 : Generalized Anxiety Score  Nervous, Anxious, on Edge 0 0 0 0  Control/stop worrying 0 0 0 0  Worry too much - different things 0 0 0 0  Trouble relaxing 0 0 0 0  Restless 0 0 0 0  Easily annoyed or irritable 0 0 0 0  Afraid - awful might happen 0 0 0 0  Total GAD 7 Score 0 0 0 0  Anxiety Difficulty Not difficult at all Not difficult at all  Not difficult at all       03/16/2022    3:51 PM 01/18/2022    9:08 AM 12/11/2021    9:12 AM  Depression screen PHQ 2/9  Decreased Interest 0 0 0  Down, Depressed, Hopeless 0 0 0  PHQ - 2 Score 0 0 0  Altered sleeping 0 0 0  Tired, decreased energy 0 0 0  Change in appetite 0 0 0  Feeling bad or failure about yourself  0 0 0  Trouble concentrating 0 0 0  Moving slowly or fidgety/restless 0 0 0  Suicidal thoughts 0 0 0  PHQ-9 Score 0 0 0  Difficult doing work/chores Not difficult at all Not difficult at all Not difficult at all    BP Readings from Last 3 Encounters:  03/16/22 (!) 140/66  02/21/22 122/70  01/18/22 138/70    Physical Exam Vitals and nursing note reviewed.  Constitutional:      General: She is not in acute distress.    Appearance: She is well-developed.  HENT:     Head: Normocephalic and atraumatic.  Cardiovascular:     Rate and Rhythm: Normal rate and regular rhythm.  Pulmonary:     Effort: Pulmonary effort is normal. No respiratory distress.     Breath sounds: No wheezing or rhonchi.  Musculoskeletal:     Cervical back: Normal range of motion.  Skin:    General: Skin is warm and dry.     Findings: No rash.  Neurological:     Mental Status: She is alert and oriented to person, place, and time.  Psychiatric:        Mood and Affect: Mood normal.        Behavior: Behavior normal.     Wt Readings from Last 3 Encounters:  03/16/22 155 lb (70.3 kg)  02/21/22 155 lb (70.3 kg)  01/18/22 154 lb (69.9 kg)    There were no vitals taken for this visit.  Assessment and Plan:

## 2022-05-02 ENCOUNTER — Encounter: Payer: Medicare Other | Admitting: *Deleted

## 2022-05-03 ENCOUNTER — Ambulatory Visit: Payer: Self-pay | Admitting: *Deleted

## 2022-05-03 NOTE — Patient Outreach (Signed)
  Care Coordination   05/03/2022 Name: SOMARA FRYMIRE MRN: [id number] DOB: February 27, 1946   Care Coordination Outreach Attempts:  An unsuccessful telephone outreach was attempted for a scheduled appointment today.  Follow Up Plan:  Additional outreach attempts will be made to offer the patient care coordination information and services.   Encounter Outcome:  No Answer   Care Coordination Interventions:  No, not indicated    Kemper Durie, RN, MSN, [facility] Care Management Care Management Coordinator [phone]

## 2022-05-04 ENCOUNTER — Encounter: Payer: Self-pay | Admitting: Internal Medicine

## 2022-05-04 ENCOUNTER — Ambulatory Visit (INDEPENDENT_AMBULATORY_CARE_PROVIDER_SITE_OTHER): Payer: Medicare Other | Admitting: Internal Medicine

## 2022-05-04 VITALS — BP 138/78 | HR 75 | Ht <= 58 in | Wt 158.0 lb

## 2022-05-04 DIAGNOSIS — I69354 Hemiplegia and hemiparesis following cerebral infarction affecting left non-dominant side: Secondary | ICD-10-CM

## 2022-05-04 DIAGNOSIS — E118 Type 2 diabetes mellitus with unspecified complications: Secondary | ICD-10-CM | POA: Diagnosis not present

## 2022-05-04 LAB — POCT GLYCOSYLATED HEMOGLOBIN (HGB A1C): Hemoglobin A1C: 6.4 % — AB (ref 4.0–5.6)

## 2022-05-04 LAB — HM DIABETES FOOT EXAM: HM Diabetic Foot Exam: NORMAL

## 2022-05-04 NOTE — Progress Notes (Signed)
Date:  05/04/2022   Name:  Sharon York   DOB:  01-15-1946   MRN:  [id number]   Chief Complaint: Diabetes  Diabetes She presents for her follow-up diabetic visit. She has type 2 diabetes mellitus. Her disease course has been stable. Pertinent negatives for hypoglycemia include no headaches or tremors. Pertinent negatives for diabetes include no blurred vision, no chest pain, no fatigue, no foot ulcerations, no polydipsia, no polyuria and no visual change. Symptoms are stable. Diabetic complications include a CVA. Current diabetic treatment includes oral agent (monotherapy) (metformin). She is compliant with treatment all of the time. She has not had a previous visit with a dietitian. Her breakfast blood glucose is taken between 6-7 am. Her breakfast blood glucose range is generally 130-140 mg/dl. Her dinner blood glucose is taken between 6-7 pm. Her dinner blood glucose range is generally 90-110 mg/dl. An ACE inhibitor/angiotensin II receptor blocker is being taken.  Hypertension This is a chronic problem. The problem is controlled. Pertinent negatives include no blurred vision, chest pain, headaches, palpitations or shortness of breath. Past treatments include ACE inhibitors. The current treatment provides significant improvement. There are no compliance problems.  Hypertensive end-organ damage includes CVA. There is no history of kidney disease or CAD/MI.    Lab Results  Component Value Date   NA 142 12/11/2021   K 4.6 12/11/2021   CO2 26 12/11/2021   GLUCOSE 106 (H) 12/11/2021   BUN 11 12/11/2021   CREATININE 0.62 12/11/2021   CALCIUM 9.4 12/11/2021   EGFR 91 12/11/2021   GFRNONAA 79 12/03/2019   Lab Results  Component Value Date   CHOL 152 12/11/2021   HDL 72 12/11/2021   LDLCALC 64 12/11/2021   TRIG 83 12/11/2021   CHOLHDL 2.1 12/11/2021   Lab Results  Component Value Date   TSH 3.420 12/11/2021   Lab Results  Component Value Date   HGBA1C 6.6 (H) 12/11/2021   Lab  Results  Component Value Date   WBC 4.8 12/11/2021   HGB 12.0 12/11/2021   HCT 37.0 12/11/2021   MCV 71 (L) 12/11/2021   PLT 269 12/11/2021   Lab Results  Component Value Date   ALT 13 12/11/2021   AST 16 12/11/2021   ALKPHOS 51 12/11/2021   BILITOT <0.2 12/11/2021   Lab Results  Component Value Date   VD25OH 123.2 (H) 12/11/2021     Review of Systems  Constitutional:  Negative for appetite change, fatigue, fever and unexpected weight change.  HENT:  Negative for tinnitus and trouble swallowing.   Eyes:  Negative for blurred vision and visual disturbance.  Respiratory:  Negative for cough, chest tightness and shortness of breath.   Cardiovascular:  Negative for chest pain, palpitations and leg swelling.  Gastrointestinal:  Negative for abdominal pain.  Endocrine: Negative for polydipsia and polyuria.  Genitourinary:  Negative for dysuria and hematuria.  Musculoskeletal:  Negative for arthralgias.  Neurological:  Negative for tremors, numbness and headaches.  Psychiatric/Behavioral:  Negative for dysphoric mood.     Patient Active Problem List   Diagnosis Date Noted   Trigger finger, right middle finger 12/11/2021   Microalbuminuria due to type 2 diabetes mellitus ([place]) 12/07/2019   Type II diabetes mellitus with complication ([place]) 78/29/5621   Right shoulder pain 03/31/2018   Hyperlipidemia associated with type 2 diabetes mellitus ([place]) 06/12/2017   Tinea corporis 03/17/2015   Abnormal gait 12/15/2014   Altered bowel function 12/15/2014   Hemiparesis affecting left side as late  effect of cerebrovascular accident (CVA) ([place]) 12/15/2014   Obstructive sleep apnea of adult 12/15/2014   Arthritis of shoulder region, degenerative 12/15/2014   OP (osteoporosis) 12/15/2014   Allergic rhinitis, seasonal 12/15/2014   Mixed incontinence 03/11/2013    Allergies  Allergen Reactions   Nitrofurantoin Nausea Only   Iodinated Contrast Media Hives and Cough    Patient developed  a hive on her left lip after injection as well as some coughing.     Past Surgical History:  Procedure Laterality Date   [place]   COLONOSCOPY  2010   normal    Social History   Tobacco Use   Smoking status: Never    Passive exposure: Never   Smokeless tobacco: Never   Tobacco comments:    smoking cessation materials not required  Vaping Use   Vaping Use: Never used  Substance Use Topics   Alcohol use: No    Alcohol/week: 0.0 standard drinks of alcohol   Drug use: Never     Medication list has been reviewed and updated.  Current Meds  Medication Sig   Accu-Chek Softclix Lancets lancets USE TO CHECK GLUCOSE UP TO 4 TIMES DAILY   alendronate (FOSAMAX) 70 MG tablet TAKE 1 TABLET BY MOUTH ONCE A WEEK. TAKE WITH A FULL GLASS OF WATER ON AN EMPTY STOMACH.   aspirin 81 MG chewable tablet Chew 1 tablet by mouth daily.   atorvastatin (LIPITOR) 10 MG tablet Take 1 tablet (10 mg total) by mouth daily.   baclofen (LIORESAL) 10 MG tablet Take 1 tablet (10 mg total) by mouth 2 (two) times daily.   blood glucose meter kit and supplies Dispense based on patient and insurance preference. Use up to four times daily as directed. (FOR ICD-10 E10.9, E11.9).   glucose blood (ACCU-CHEK GUIDE) test strip USE 1 STRIP TO CHECK GLUCOSE 4 TIMES DAILY   Glucose Blood Automatic (POGO AUTOMATIC TEST CARTRIDGES) TEST 1 Cartridge by In Vitro route 3 (three) times daily.   lisinopril (ZESTRIL) 10 MG tablet Take 1 tablet (10 mg total) by mouth daily.   metFORMIN (GLUCOPHAGE) 500 MG tablet TAKE 1 TABLET BY MOUTH ONCE DAILY WITH BREAKFAST -  REPLACES  250  MG  DOSE   Multiple Vitamins-Minerals (MULTIVITAMIN ADULT PO) Take by mouth.       05/04/2022    8:03 AM 03/16/2022    3:51 PM 01/18/2022    9:08 AM 12/11/2021    9:12 AM  GAD 7 : Generalized Anxiety Score  Nervous, Anxious, on Edge 0 0 0 0  Control/stop worrying 0 0 0 0  Worry too much - different things 0 0 0 0  Trouble relaxing 0 0  0 0  Restless 0 0 0 0  Easily annoyed or irritable 0 0 0 0  Afraid - awful might happen 0 0 0 0  Total GAD 7 Score 0 0 0 0  Anxiety Difficulty Not difficult at all Not difficult at all Not difficult at all        05/04/2022    8:02 AM 03/16/2022    3:51 PM 01/18/2022    9:08 AM  Depression screen PHQ 2/9  Decreased Interest 0 0 0  Down, Depressed, Hopeless 0 0 0  PHQ - 2 Score 0 0 0  Altered sleeping 0 0 0  Tired, decreased energy 0 0 0  Change in appetite 0 0 0  Feeling bad or failure about yourself  0 0 0  Trouble concentrating 0  0 0  Moving slowly or fidgety/restless 0 0 0  Suicidal thoughts 0 0 0  PHQ-9 Score 0 0 0  Difficult doing work/chores Not difficult at all Not difficult at all Not difficult at all    BP Readings from Last 3 Encounters:  05/04/22 138/78  03/16/22 (!) 140/66  02/21/22 122/70    Physical Exam Vitals and nursing note reviewed.  Constitutional:      General: She is not in acute distress.    Appearance: She is well-developed.  HENT:     Head: Normocephalic and atraumatic.  Cardiovascular:     Rate and Rhythm: Normal rate and regular rhythm.     Heart sounds: No murmur heard. Pulmonary:     Effort: Pulmonary effort is normal. No respiratory distress.     Breath sounds: No wheezing or rhonchi.  Musculoskeletal:     Cervical back: Normal range of motion.     Right lower leg: No edema.     Left lower leg: No edema.  Lymphadenopathy:     Cervical: No cervical adenopathy.  Skin:    General: Skin is warm and dry.     Findings: No rash.  Neurological:     Mental Status: She is alert and oriented to person, place, and time. Mental status is at baseline.  Psychiatric:        Mood and Affect: Mood normal.        Behavior: Behavior normal.     Wt Readings from Last 3 Encounters:  05/04/22 158 lb (71.7 kg)  03/16/22 155 lb (70.3 kg)  02/21/22 155 lb (70.3 kg)    BP 138/78   Pulse 75   Ht _0  (1.346 m)   Wt 158 lb (71.7 kg)   SpO2  96%   BMI 39.55 kg/m   Assessment and Plan: 1. Type II diabetes mellitus with complication (HCC) Clinically stable by exam and report without s/s of hypoglycemia. DM complicated by hypertension and dyslipidemia. Tolerating medications well without side effects or other concerns. - POCT glycosylated hemoglobin (Hb A1C)= 6.4  2. Hemiparesis affecting left side as late effect of cerebrovascular accident (CVA) ([place]) Stable Getting a new leg/ankle brace next week She has not followed up on the wheelchair rack for her vehicle.   Partially dictated using Editor, commissioning. Any errors are unintentional.  Halina Maidens, MD [facility]  05/04/2022

## 2022-05-09 DIAGNOSIS — H25812 Combined forms of age-related cataract, left eye: Secondary | ICD-10-CM | POA: Diagnosis not present

## 2022-05-09 DIAGNOSIS — I69354 Hemiplegia and hemiparesis following cerebral infarction affecting left non-dominant side: Secondary | ICD-10-CM | POA: Diagnosis not present

## 2022-05-09 DIAGNOSIS — E1136 Type 2 diabetes mellitus with diabetic cataract: Secondary | ICD-10-CM | POA: Diagnosis not present

## 2022-05-09 DIAGNOSIS — G4733 Obstructive sleep apnea (adult) (pediatric): Secondary | ICD-10-CM | POA: Diagnosis not present

## 2022-05-09 DIAGNOSIS — H52202 Unspecified astigmatism, left eye: Secondary | ICD-10-CM | POA: Diagnosis not present

## 2022-05-09 DIAGNOSIS — I10 Essential (primary) hypertension: Secondary | ICD-10-CM | POA: Diagnosis not present

## 2022-05-10 DIAGNOSIS — H2511 Age-related nuclear cataract, right eye: Secondary | ICD-10-CM | POA: Diagnosis not present

## 2022-05-18 ENCOUNTER — Telehealth: Payer: Self-pay | Admitting: *Deleted

## 2022-05-18 NOTE — Progress Notes (Signed)
**Note Sharon-Identified via Obfuscation**   Care Coordination Note  05/18/2022 Name: [name] MRN: [id number] DOB: January 07, 1946  Sharon York is a 76 y.o. year old female who is a primary care patient of Reubin Milan, MD and is actively engaged with the care management team. I reached out to Sharon York by phone today to assist with re-scheduling a follow up visit with the RN Case Manager  Follow up plan: Unsuccessful telephone outreach attempt made. A HIPAA compliant phone message was left for the patient providing contact information and requesting a return call.   Burman Nieves, CCMA Care Coordination Care Guide Direct Dial: [phone]

## 2022-05-23 DIAGNOSIS — D649 Anemia, unspecified: Secondary | ICD-10-CM | POA: Diagnosis not present

## 2022-05-23 DIAGNOSIS — H25811 Combined forms of age-related cataract, right eye: Secondary | ICD-10-CM | POA: Diagnosis not present

## 2022-05-23 DIAGNOSIS — E119 Type 2 diabetes mellitus without complications: Secondary | ICD-10-CM | POA: Diagnosis not present

## 2022-05-23 DIAGNOSIS — G4733 Obstructive sleep apnea (adult) (pediatric): Secondary | ICD-10-CM | POA: Diagnosis not present

## 2022-05-23 DIAGNOSIS — H52201 Unspecified astigmatism, right eye: Secondary | ICD-10-CM | POA: Diagnosis not present

## 2022-05-23 DIAGNOSIS — I10 Essential (primary) hypertension: Secondary | ICD-10-CM | POA: Diagnosis not present

## 2022-05-25 ENCOUNTER — Telehealth: Payer: Self-pay | Admitting: Internal Medicine

## 2022-05-25 NOTE — Telephone Encounter (Signed)
[employer] faxed over form on 12.11.23 and every 7 business days after that / they called to get an update on the diabetes supplies / please advise /

## 2022-05-26 ENCOUNTER — Telehealth: Payer: Self-pay | Admitting: Internal Medicine

## 2022-05-28 NOTE — Telephone Encounter (Signed)
Pt calling to check status of refill

## 2022-05-30 NOTE — Telephone Encounter (Signed)
[place] called and spoke to [place], [facility] about the refill(s) Accucheck Guide test strips requested. She says the patient picked up on 05/13/22 and her insurance will not allow another pickup until 06/01/22, patient could call insurance company to explain what happened with the strips, out of pocket over $40. Patient called back and advised, she verbalized understanding.

## 2022-05-30 NOTE — Telephone Encounter (Signed)
Pt called in to follow up on her refill for the ACCU-CHEK GUIDE test strip. Pt stated the pharmacy does not have them for her.   I called the pharmacy directly and was advised they have Rx, but it's too soon to fill pharmacy stated pt received 100 strips on 12/24.  Pt stated she is on her last strip; she uses one strip to check glucose three times daily. However, she mentioned she has had a few bad strips, and she grabs a new one.  Please advise.

## 2022-06-01 ENCOUNTER — Telehealth: Payer: Self-pay | Admitting: Internal Medicine

## 2022-06-01 NOTE — Telephone Encounter (Signed)
[place] calling from [employer] is calling to check on a fax sent 05/17/22 for continous glucose monitor? Please advise CB- [phone] option 3

## 2022-06-04 NOTE — Telephone Encounter (Signed)
The patients name is not correct on the order so we shred the document. It needs to be resent with the patients correct information on it.  Sharon York

## 2022-06-07 ENCOUNTER — Telehealth: Payer: Self-pay | Admitting: Internal Medicine

## 2022-06-07 ENCOUNTER — Encounter: Payer: Self-pay | Admitting: Family Medicine

## 2022-06-07 ENCOUNTER — Ambulatory Visit: Payer: Self-pay | Admitting: *Deleted

## 2022-06-07 ENCOUNTER — Ambulatory Visit (INDEPENDENT_AMBULATORY_CARE_PROVIDER_SITE_OTHER): Payer: 59 | Admitting: Family Medicine

## 2022-06-07 VITALS — BP 126/80 | HR 78 | Ht <= 58 in | Wt 158.0 lb

## 2022-06-07 DIAGNOSIS — K118 Other diseases of salivary glands: Secondary | ICD-10-CM

## 2022-06-07 NOTE — Patient Instructions (Signed)
Parotitis  Parotitis is inflammation of one or both of the parotid glands. These glands produce saliva. They are found on each side of the face, below and in front of the earlobes. The saliva that they produce comes out of tiny openings (ducts) inside the cheeks. Parotitis may cause sudden swelling and pain (acute parotitis). It can also cause repeated episodes of swelling and pain or continued swelling that may or may not be painful (chronic parotitis). What are the causes? This condition may be caused by: Infections from bacteria. Infections from viruses, such as mumps or HIV. Blockage (obstruction) of saliva flow through the parotid glands. This can be from a stone, scar tissue, or a tumor. Diseases that cause your body's defense system (immune system) to attack healthy cells in your salivary glands. These are called autoimmune diseases. What increases the risk? You are more likely to develop this condition if: You are 50 years old or older. You do not drink enough fluids (are dehydrated). You drink too much alcohol. You have: A dry mouth. Diabetes. Gout. A long-term illness. You do not take good care of your mouth and teeth (poor oral hygiene). You have had radiation treatments to the head and neck. You take certain medicines. What are the signs or symptoms? Symptoms of this condition depend on the cause. Symptoms may include: Swelling under and in front of the ear. This may get worse after eating. Pain and tenderness over the parotid gland. This may get worse after eating. Redness and warmth of the skin over the parotid gland. Fever or chills. Pus coming from the ducts inside the mouth. Dry mouth. A bad taste in the mouth. How is this diagnosed? This condition may be diagnosed based on: Your medical history. A physical exam. Tests to find the cause of the parotitis. These may include: Doing blood tests to check for an autoimmune disease or infections from a virus. Taking a  fluid sample from the parotid gland and testing it for infection. Injecting the ducts of the parotid gland with a dye and then taking X-rays (sialogram). Having other imaging tests of the gland, such as X-rays, ultrasound, MRI, or CT scan. Checking the opening of the gland for a stone or obstruction. Placing a needle into the gland to remove tissue for a biopsy (fine needle aspiration). How is this treated? Treatment for this condition depends on the cause. Treatment may include: Antibiotic medicine for a bacterial infection. NSAIDs, such as ibuprofen, to treat pain and swelling. Drinking more fluids. Removing a stone or obstruction. Treating an underlying disease that is causing parotitis. Surgery to drain an infection, remove a growth, or remove the whole gland (parotidectomy). Treatment may not be needed if parotid swelling goes away with home care. Follow these instructions at home: Medicines  Take over-the-counter and prescription medicines only as told by your health care provider. If you were prescribed an antibiotic medicine, take it as told by your health care provider. Do not stop taking the antibiotic even if you start to feel better. Managing pain and swelling If directed, apply heat to the affected area as often as told by your health care provider. Use the heat source that your health care provider recommends, such as a moist heat pack or a heating pad. Place a towel between your skin and the heat source. Leave the heat on for 20-30 minutes. Remove the heat if your skin turns bright red. This is especially important if you are unable to feel pain, heat, or cold.   You have a greater risk of getting burned. Gargle with a mixture of salt and water 3-4 times a day or as needed. To make salt water, completely dissolve -1 tsp (3-6 g) of salt in 1 cup (237 mL) of warm water. Gently massage the parotid glands as told by your health care provider. General instructions  Drink enough  fluid to keep your urine pale yellow. Keep your mouth clean and moist. Try sucking on sour candy. This may help to make your mouth less dry by stimulating the flow of saliva. Maintain good oral health. Brush your teeth at least two times a day. Floss your teeth every day. See your dentist regularly. Do not use any products that contain nicotine or tobacco. These products include cigarettes, chewing tobacco, and vaping devices, such as e-cigarettes. If you need help quitting, ask your health care provider. Do not drink alcohol. Keep all follow-up visits. This is important. Contact a health care provider if: You have a fever or chills. You have new symptoms. Your symptoms get worse. Your symptoms do not improve with treatment. Get help right away if: You have difficulty breathing or swallowing because of the swollen gland. These symptoms may represent a serious problem that is an emergency. Do not wait to see if the symptoms will go away. Get medical help right away. Call your local emergency services (911 in the [place] Do not drive yourself to the hospital. Summary Parotitis is inflammation of one or both of the parotid glands. Symptoms include pain and swelling under and in front of the ear. They may also include a fever and a bad taste in your mouth. This condition may be treated with antibiotics, NSAIDs, increasing fluids, or surgery. In some cases, parotitis may go away on its own without treatment. You should drink plenty of fluids, maintain good oral health, and do not use products that contain nicotine or tobacco. This information is not intended to replace advice given to you by your health care provider. Make sure you discuss any questions you have with your health care provider. Document Revised: 09/16/2020 Document Reviewed: 09/16/2020 Elsevier Patient Education  2023 [employer]  

## 2022-06-07 NOTE — Telephone Encounter (Signed)
Patient stated that [employer] [facility] wants to order her monitor but needs Dr. Army Melia signature in order for her to get it. Patient would like a follow up on this matter.

## 2022-06-07 NOTE — Progress Notes (Signed)
Date:  06/07/2022   Name:  Sharon York   DOB:  [birth year]   MRN:  [id number]   Chief Complaint: Jaw Pain (R) lower jaw- hurts when she chews. Wears dentures. Feels swollen and hurts when she puts her finger on that area)  Patient is a 77 year old female who presents for a right jaw pain evaluation. The patient reports the following problems: submandibular pain. Health maintenance has been reviewed up to date.      Lab Results  Component Value Date   NA 142 12/11/2021   K 4.6 12/11/2021   CO2 26 12/11/2021   GLUCOSE 106 (H) 12/11/2021   BUN 11 12/11/2021   CREATININE 0.62 12/11/2021   CALCIUM 9.4 12/11/2021   EGFR 91 12/11/2021   GFRNONAA 79 12/03/2019   Lab Results  Component Value Date   CHOL 152 12/11/2021   HDL 72 12/11/2021   LDLCALC 64 12/11/2021   TRIG 83 12/11/2021   CHOLHDL 2.1 12/11/2021   Lab Results  Component Value Date   TSH 3.420 12/11/2021   Lab Results  Component Value Date   HGBA1C 6.4 (A) 05/04/2022   Lab Results  Component Value Date   WBC 4.8 12/11/2021   HGB 12.0 12/11/2021   HCT 37.0 12/11/2021   MCV 71 (L) 12/11/2021   PLT 269 12/11/2021   Lab Results  Component Value Date   ALT 13 12/11/2021   AST 16 12/11/2021   ALKPHOS 51 12/11/2021   BILITOT <0.2 12/11/2021   Lab Results  Component Value Date   VD25OH 123.2 (H) 12/11/2021     Review of Systems  Constitutional: Negative.  Negative for fever.  HENT:  Negative for rhinorrhea and sneezing.   Respiratory:  Negative for cough, shortness of breath, wheezing and stridor.   Cardiovascular:  Negative for chest pain, palpitations and leg swelling.  Gastrointestinal:  Negative for abdominal pain, blood in stool, constipation, diarrhea and nausea.  Genitourinary:  Negative for dysuria, flank pain, frequency, hematuria, urgency and vaginal discharge.  Musculoskeletal:  Negative for arthralgias, back pain and myalgias.  Skin:  Negative for rash.  Neurological:  Negative for  dizziness, weakness and headaches.  Hematological:  Negative for adenopathy. Does not bruise/bleed easily.  Psychiatric/Behavioral:  Negative for dysphoric mood. The patient is not nervous/anxious.     Patient Active Problem List   Diagnosis Date Noted   Trigger finger, right middle finger 12/11/2021   Microalbuminuria due to type 2 diabetes mellitus ([place]) 12/07/2019   Type II diabetes mellitus with complication ([place]) 28/31/5176   Right shoulder pain 03/31/2018   Hyperlipidemia associated with type 2 diabetes mellitus ([place]) 06/12/2017   Tinea corporis 03/17/2015   Abnormal gait 12/15/2014   Altered bowel function 12/15/2014   Hemiparesis affecting left side as late effect of cerebrovascular accident (CVA) ([place]) 12/15/2014   Obstructive sleep apnea of adult 12/15/2014   Arthritis of shoulder region, 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     Past Surgical History:  Procedure Laterality Date   [place]   COLONOSCOPY  2010   normal    Social History   Tobacco Use   Smoking status: Never    Passive  exposure: Never   Smokeless tobacco: Never   Tobacco comments:    smoking cessation materials not required  Vaping Use   Vaping Use: Never used  Substance Use Topics   Alcohol use: No    Alcohol/week: 0.0 standard drinks of alcohol   Drug use: Never     Medication list has been reviewed and updated.  Current Meds  Medication Sig   ACCU-CHEK GUIDE test strip USE 1 STRIP TO CHECK GLUCOSE  4 TIMES DAILY   Accu-Chek Softclix Lancets lancets USE TO CHECK GLUCOSE UP TO 4 TIMES DAILY   alendronate (FOSAMAX) 70 MG tablet TAKE 1 TABLET BY MOUTH ONCE A WEEK. TAKE WITH A FULL GLASS OF  WATER ON AN EMPTY STOMACH.   aspirin 81 MG chewable tablet Chew 1 tablet by mouth daily.   atorvastatin (LIPITOR) 10 MG tablet Take 1 tablet (10 mg total) by mouth daily.   baclofen (LIORESAL) 10 MG tablet Take 1 tablet (10 mg total) by mouth 2 (two) times daily.   blood glucose meter kit and supplies Dispense based on patient and insurance preference. Use up to four times daily as directed. (FOR ICD-10 E10.9, E11.9).   Glucose Blood Automatic (POGO AUTOMATIC TEST CARTRIDGES) TEST 1 Cartridge by In Vitro route 3 (three) times daily.   lisinopril (ZESTRIL) 10 MG tablet Take 1 tablet (10 mg total) by mouth daily.   metFORMIN (GLUCOPHAGE) 500 MG tablet TAKE 1 TABLET BY MOUTH ONCE DAILY WITH BREAKFAST -  REPLACES  250  MG  DOSE   Multiple Vitamins-Minerals (MULTIVITAMIN ADULT PO) Take by mouth.       06/07/2022    4:23 PM 05/04/2022    8:03 AM 03/16/2022    3:51 PM 01/18/2022    9:08 AM  GAD 7 : Generalized Anxiety Score  Nervous, Anxious, on Edge 0 0 0 0  Control/stop worrying 0 0 0 0  Worry too much - different things 0 0 0 0  Trouble relaxing 0 0 0 0  Restless 0 0 0 0  Easily annoyed or irritable 0 0 0 0  Afraid - awful might happen 0 0 0 0  Total GAD 7 Score 0 0 0 0  Anxiety Difficulty Not difficult at all Not difficult at all Not difficult at all Not difficult at all       06/07/2022    4:23 PM 05/04/2022    8:02 AM 03/16/2022    3:51 PM  Depression screen PHQ 2/9  Decreased Interest 0 0 0  Down, Depressed, Hopeless 0 0 0  PHQ - 2 Score 0 0 0  Altered sleeping 0 0 0  Tired, decreased energy 0 0 0  Change in appetite 0 0 0  Feeling bad or failure about yourself  0 0 0  Trouble concentrating 0 0 0  Moving slowly or fidgety/restless 0 0 0  Suicidal thoughts 0 0 0  PHQ-9 Score 0 0 0  Difficult doing work/chores Not difficult at all Not difficult at all Not difficult at all    BP Readings from Last 3 Encounters:  06/07/22 126/80  05/04/22 138/78  03/16/22 (!) 140/66     Physical Exam Vitals reviewed.  HENT:     Head: Normocephalic.     Jaw: There is normal jaw occlusion. No trismus, tenderness, swelling, pain on movement or malocclusion.     Right Ear: Tympanic membrane, ear canal and external ear normal.     Left Ear: Tympanic membrane, ear canal and external ear  normal.     Nose: Nose normal. No congestion or rhinorrhea.     Mouth/Throat:     Lips: Pink.     Mouth: Mucous membranes are dry. No oral lesions.     Pharynx: No oropharyngeal exudate or posterior oropharyngeal erythema.     Comments: Dentures nonfitting Eyes:     General:        Right eye: No discharge.        Left eye: No discharge.     Extraocular Movements: Extraocular movements intact.     Pupils: Pupils are equal, round, and reactive to light.  Neck:     Thyroid: No thyroid mass, thyromegaly or thyroid tenderness.  Cardiovascular:     Rate and Rhythm: Normal rate.     Heart sounds: S1 normal and S2 normal. No murmur heard.    No systolic murmur is present.     No diastolic murmur is present.     No friction rub. No gallop. No S3 or S4 sounds.  Pulmonary:     Effort: Pulmonary effort is normal.     Breath sounds: Normal breath sounds. No decreased breath sounds, wheezing, rhonchi or rales.  Musculoskeletal:     Cervical back: Normal range of motion and neck supple.  Lymphadenopathy:     Head:     Right side of head: No submental, submandibular or tonsillar adenopathy.     Left side of head: No submental, submandibular or tonsillar adenopathy.     Cervical: No cervical adenopathy.     Right cervical: No superficial, deep or posterior cervical adenopathy.    Left cervical: No superficial, deep or posterior cervical adenopathy.  Neurological:     Mental Status: She is alert.     Wt Readings from Last 3 Encounters:  06/07/22 158 lb (71.7 kg)  05/04/22 158 lb (71.7 kg)  03/16/22 155 lb (70.3 kg)    BP 126/80   Pulse 78   Ht 4\' 5"  (1.346 m)   Wt 158 lb (71.7 kg)    SpO2 95%   BMI 39.55 kg/m   Assessment and Plan: 1. Parotid duct obstruction New onset.  Resolved.  Stable.  Presently no tenderness swelling or oral obstruction of the Stensen duct..  Patient has had resolution of her symptoms from earlier which I think may have either been a stone or may be some inflammation of the parotid duct which is in the process of resolving there is no evidence of infection and I have asked the patient to pick up some orange juice or lemonade and to take an occasional glass just to stimulate the salivary gland over the next 2 to 3 days.    Otilio Miu, MD

## 2022-06-07 NOTE — Telephone Encounter (Signed)
Summary: Jaw pain/swelling   Pt called reportng some jaw pain/swelling. Has questions for a nurse           Chief Complaint: right jaw pain/ swelling  Symptoms: ate breakfast this am and reports started feeling jaw pain right side and now swelling right jaw area and under right ear. Sore to touch area. Can open mouth no clicking sensation reported  Frequency: this am  Pertinent Negatives: Patient denies chest pain no difficulty breathing no fever. No redness no rash. No cold sx Disposition: [] ED /[] Urgent Care (no appt availability in office) / [x] Appointment(In office/virtual)/ []  [place] [facility]/ [] Home Care/ [] Refused Recommended Disposition /[] [facility]/ []  Follow-up with PCP Additional Notes:   Able to schedule same day appt with different provider. Please advise       Reason for Disposition  [1] Swollen area of face AND [2] is painful to touch  Answer Assessment - Initial Assessment Questions 1. ONSET: "When did the pain start?" (e.g., minutes, hours, days)     This am. Ate breakfast and noted right jaw pain and now swelling noted  2. ONSET: "Does the pain come and go, or has it been constant since it started?" (e.g., constant, intermittent, fleeting)     Na  3. SEVERITY: "How bad is the pain?"   (Scale 1-10; mild, moderate or severe)   - MILD (1-3): doesn't interfere with normal activities    - MODERATE (4-7): interferes with normal activities or awakens from sleep    - SEVERE (8-10): excruciating pain, unable to do any normal activities      Started this am after eating and jaw swelling noted and under right ear  4. LOCATION: "Where does it hurt?"      Right jaw area and under right ear  5. RASH: "Is there any redness, rash, or swelling of the face?"     Swelling right jaw / under ear . No rash no redness 6. FEVER: "Do you have a fever?" If Yes, ask: "What is it, how was it measured, and when did it start?"      na 7. OTHER SYMPTOMS: "Do you have  any other symptoms?" (e.g., fever, toothache, nasal discharge, nasal congestion, clicking sensation in jaw joint)     Soreness to touch right jaw 8. PREGNANCY: "Is there any chance you are pregnant?" "When was your last menstrual period?"     na  Protocols used: Face Pain-A-AH

## 2022-06-08 NOTE — Telephone Encounter (Signed)
Informed patient that her insurance was contacted for the [place] monitor and they will not cover this.

## 2022-06-11 ENCOUNTER — Telehealth: Payer: Self-pay

## 2022-06-11 NOTE — Telephone Encounter (Signed)
Received FAX from [employer] to order a continuous glucose monitor. Form reads patient MUST be on insulin OR patient has recurrent level 2 hypoglycemic events OR patient has hx of one Level 3 Hypoglycemic event.   Patient MUST have one of these three to get approved for this monitor. Patient does not have any of these. Patient informed insurance will not cover this.  Sharon York

## 2022-06-13 NOTE — Progress Notes (Signed)
  Care Coordination Note  06/13/2022 Name: Sharon York MRN: [id number] DOB: [birth year]  Sharon York is a 77 y.o. year old female who is a primary care patient of [name], MD and is actively engaged with the care management team. I reached out to Sharon York by phone today to assist with re-scheduling a follow up visit with the RN Case Manager  Follow up plan: We have been unable to make contact with the patient for follow up.   Julian Hy, [place] Direct Dial: [phone]

## 2022-06-14 ENCOUNTER — Telehealth: Payer: Self-pay | Admitting: Internal Medicine

## 2022-06-14 ENCOUNTER — Other Ambulatory Visit: Payer: Self-pay | Admitting: Internal Medicine

## 2022-06-14 DIAGNOSIS — E118 Type 2 diabetes mellitus with unspecified complications: Secondary | ICD-10-CM

## 2022-06-14 NOTE — Telephone Encounter (Signed)
Pt was informed that insurance will not cover. Will not complete.  KP

## 2022-06-14 NOTE — Telephone Encounter (Signed)
Copied from [place] [id number]. Topic: General - Other >> Jun 14, 2022 12:36 PM Cyndi Bender wrote: Reason for CRM: [facility] with [employer] called for an update on if the fax was received because they have not received anything back. Informed [facility] that the notes show that a fax was received. Cb# [phone] Option# 3

## 2022-06-20 DIAGNOSIS — H905 Unspecified sensorineural hearing loss: Secondary | ICD-10-CM | POA: Diagnosis not present

## 2022-07-05 ENCOUNTER — Other Ambulatory Visit: Payer: Self-pay | Admitting: Internal Medicine

## 2022-07-05 DIAGNOSIS — E118 Type 2 diabetes mellitus with unspecified complications: Secondary | ICD-10-CM

## 2022-07-05 DIAGNOSIS — I10 Essential (primary) hypertension: Secondary | ICD-10-CM

## 2022-07-16 ENCOUNTER — Other Ambulatory Visit: Payer: Self-pay | Admitting: Internal Medicine

## 2022-07-16 ENCOUNTER — Ambulatory Visit: Payer: Medicare Other

## 2022-07-16 ENCOUNTER — Telehealth: Payer: Self-pay | Admitting: Internal Medicine

## 2022-07-16 DIAGNOSIS — E118 Type 2 diabetes mellitus with unspecified complications: Secondary | ICD-10-CM

## 2022-07-16 NOTE — Telephone Encounter (Signed)
Paperwork filled out and placed in Dr Du Pont box for signature.

## 2022-07-16 NOTE — Telephone Encounter (Signed)
Patient is calling to check on the status of the competition of her handicap decal paperwok. Please advise CB- [id number]

## 2022-07-16 NOTE — Telephone Encounter (Signed)
Medication Refill - Medication: Accu-Chek Softclix Lancets lancets [id number]   Has the patient contacted their pharmacy? Yes.   (Agent: If no, request that the patient contact the pharmacy for the refill. If patient does not wish to contact the pharmacy document the reason why and proceed with request.) (Agent: If yes, when and what did the pharmacy advise?)  Preferred Pharmacy (with phone number or street name):  [place], [place] - [place] [address] [place] [address] Phone: [phone]  Fax: [phone]   Has the patient been seen for an appointment in the last year OR does the patient have an upcoming appointment? Yes.    Agent: Please be advised that RX refills may take up to 3 business days. We ask that you follow-up with your pharmacy.

## 2022-07-17 MED ORDER — ACCU-CHEK SOFTCLIX LANCETS MISC: 1 refills | Status: DC

## 2022-07-17 NOTE — Telephone Encounter (Signed)
The patient has called to notify the practice that they would like to pick up their paperwork today 07/17/22  Please contact the patient when document is signed

## 2022-07-17 NOTE — Telephone Encounter (Signed)
Pt came to pick up form today.  Sharon York

## 2022-07-17 NOTE — Telephone Encounter (Signed)
Requested Prescriptions  Pending Prescriptions Disp Refills   Accu-Chek Softclix Lancets lancets 100 each 0    Sig: USE TO CHECK GLUCOSE UP TO 4 TIMES DAILY     Endocrinology: Diabetes - Testing Supplies Passed - 07/16/2022  1:41 PM      Passed - Valid encounter within last 12 months    Recent Outpatient Visits           1 month ago Parotid duct obstruction   [facility] [place], [place], MD   2 months ago Type II diabetes mellitus with complication [facility])   [place], Jesse Sans, MD   4 months ago Hemiparesis affecting left side as late effect of cerebrovascular accident (CVA) [facility])   [place] [facility] at [facility], Jesse Sans, MD   4 months ago Hemiparesis affecting left side as late effect of cerebrovascular accident (CVA) [facility])   [place] at [facility], Jesse Sans, MD   6 months ago Essential hypertension   [place] at [facility], Jesse Sans, MD       Future Appointments             In 4 months Army Melia, Jesse Sans, MD [place] at [facility], [facility]

## 2022-07-19 ENCOUNTER — Ambulatory Visit (INDEPENDENT_AMBULATORY_CARE_PROVIDER_SITE_OTHER): Payer: 59

## 2022-07-19 VITALS — Ht <= 58 in | Wt 158.0 lb

## 2022-07-19 DIAGNOSIS — Z0000 Encounter for general adult medical examination without abnormal findings: Secondary | ICD-10-CM

## 2022-07-19 NOTE — Patient Instructions (Signed)
Sharon York , Thank you for taking time to come for your Medicare Wellness Visit. I appreciate your ongoing commitment to your health goals. Please review the following plan we discussed and let me know if I can assist you in the future.   These are the goals we discussed:  Goals      DIET - EAT MORE FRUITS AND VEGETABLES     DIET - INCREASE WATER INTAKE     Recommend to drink at least 6-8 8oz glasses of water per day.     Increase physical activity     Pt would like to increase mobility and walk more inside and outside when weather is nice.      RNCM: Effective Management of HTN     Care Coordination Interventions:  BP Readings from Last 3 Encounters:  01/18/22 138/70  01/01/22 132/71  12/11/21 130/84    The patient has had her AWV Evaluation of current treatment plan related to hypertension self management and patient's adherence to plan as established by provider. The patient saw the pcp post ER visit and medication changes made. The patient states she has been taking her blood pressures and her daughter has been writing them down. Education on elevation of feet when sitting down and using compression socks to help with circulation. The patient states that she has swelling in her left ankle more than the right.  Provided education to patient re: stroke prevention, s/s of heart attack and stroke Reviewed medications with patient and discussed importance of compliance. The patient is compliant with Lisinopril 10 mg daily. This was a recent changes. Review of orthostatic hypotension and how to monitor for changes Counseled on the importance of exercise goals with target of 150 minutes per week Discussed plans with patient for ongoing care management follow up and provided patient with direct contact information for care management team Advised patient, providing education and rationale, to monitor blood pressure daily and record, calling PCP for findings outside established parameters.  Patients blood pressure today was 123/71. She said the other day it was 173/92 and 118/84. Review of the goal of systolic pressure [id number] and diastolic 0000000. The patient verbalized understanding  Reviewed scheduled/upcoming provider appointments including: 12-12-2021 with pcp Advised patient to discuss changes in blood pressures, heart health, or other chronic conditions with provider Provided education on prescribed diet heart healthy/ADA diet. The patient states that she does use some salt but she has cut back. Discussed how it is important to monitor for foods with hidden salts.  Discussed complications of poorly controlled blood pressure such as heart disease, stroke, circulatory complications, vision complications, kidney impairment, sexual dysfunction Screening for signs and symptoms of depression related to chronic disease state  Assessed social determinant of health barriers 01-24-2022: The patient agrees to regular outreaches with the [facility]. She has the number to call for changes or needs. Review of the goals of the program.  Active listening / Reflection utilized  Emotional Support Provided         This is a list of the screening recommended for you and due dates:  Health Maintenance  Topic Date Due   COVID-19 Vaccine (5 - 2023-24 season) 05/11/2022   Yearly kidney health urinalysis for diabetes  08/17/2022   Hemoglobin A1C  11/03/2022   Yearly kidney function blood test for diabetes  12/12/2022   Eye exam for diabetics  03/02/2023   Mammogram  03/16/2023   Complete foot exam   05/05/2023   Medicare Annual Wellness Visit  07/19/2023   DTaP/Tdap/Td vaccine (2 - Td or Tdap) 07/10/2028   Pneumonia Vaccine  Completed   Flu Shot  Completed   DEXA scan (bone density measurement)  Completed   Hepatitis C Screening: USPSTF Recommendation to screen - Ages 61-79 yo.  Completed   Zoster (Shingles) Vaccine  Completed   HPV Vaccine  Aged Out   Colon Cancer Screening  Discontinued     Advanced directives: yes  Conditions/risks identified: none  Next appointment: Follow up in one year for your annual wellness visit 07/25/23 @ 1:30 pm by phone   Preventive Care 65 Years and Older, Female Preventive care refers to lifestyle choices and visits with your health care provider that can promote health and wellness. What does preventive care include? A yearly physical exam. This is also called an annual well check. Dental exams once or twice a year. Routine eye exams. Ask your health care provider how often you should have your eyes checked. Personal lifestyle choices, including: Daily care of your teeth and gums. Regular physical activity. Eating a healthy diet. Avoiding tobacco and drug use. Limiting alcohol use. Practicing safe sex. Taking low-dose aspirin every day. Taking vitamin and mineral supplements as recommended by your health care provider. What happens during an annual well check? The services and screenings done by your health care provider during your annual well check will depend on your age, overall health, lifestyle risk factors, and family history of disease. Counseling  Your health care provider may ask you questions about your: Alcohol use. Tobacco use. Drug use. Emotional well-being. Home and relationship well-being. Sexual activity. Eating habits. History of falls. Memory and ability to understand (cognition). Work and work Statistician. Reproductive health. Screening  You may have the following tests or measurements: Height, weight, and BMI. Blood pressure. Lipid and cholesterol levels. These may be checked every 5 years, or more frequently if you are over 35 years old. Skin check. Lung cancer screening. You may have this screening every year starting at age 55 if you have a 30-pack-year history of smoking and currently smoke or have quit within the past 15 years. Fecal occult blood test (FOBT) of the stool. You may have this test  every year starting at age 30. Flexible sigmoidoscopy or colonoscopy. You may have a sigmoidoscopy every 5 years or a colonoscopy every 10 years starting at age 8. Hepatitis C blood test. Hepatitis B blood test. Sexually transmitted disease (STD) testing. Diabetes screening. This is done by checking your blood sugar (glucose) after you have not eaten for a while (fasting). You may have this done every 1-3 years. Bone density scan. This is done to screen for osteoporosis. You may have this done starting at age 4. Mammogram. This may be done every 1-2 years. Talk to your health care provider about how often you should have regular mammograms. Talk with your health care provider about your test results, treatment options, and if necessary, the need for more tests. Vaccines  Your health care provider may recommend certain vaccines, such as: Influenza vaccine. This is recommended every year. Tetanus, diphtheria, and acellular pertussis (Tdap, Td) vaccine. You may need a Td booster every 10 years. Zoster vaccine. You may need this after age 57. Pneumococcal 13-valent conjugate (PCV13) vaccine. One dose is recommended after age 44. Pneumococcal polysaccharide (PPSV23) vaccine. One dose is recommended after age 98. Talk to your health care provider about which screenings and vaccines you need and how often you need them. This information is not intended  to replace advice given to you by your health care provider. Make sure you discuss any questions you have with your health care provider. Document Released: 06/03/2015 Document Revised: 01/25/2016 Document Reviewed: 03/08/2015 Elsevier Interactive Patient Education  2017 [place] Prevention in the Home Falls can cause injuries. They can happen to people of all ages. There are many things you can do to make your home safe and to help prevent falls. What can I do on the outside of my home? Regularly fix the edges of walkways and driveways  and fix any cracks. Remove anything that might make you trip as you walk through a door, such as a raised step or threshold. Trim any bushes or trees on the path to your home. Use bright outdoor lighting. Clear any walking paths of anything that might make someone trip, such as rocks or tools. Regularly check to see if handrails are loose or broken. Make sure that both sides of any steps have handrails. Any raised decks and porches should have guardrails on the edges. Have any leaves, snow, or ice cleared regularly. Use sand or salt on walking paths during winter. Clean up any spills in your garage right away. This includes oil or grease spills. What can I do in the bathroom? Use night lights. Install grab bars by the toilet and in the tub and shower. Do not use towel bars as grab bars. Use non-skid mats or decals in the tub or shower. If you need to sit down in the shower, use a plastic, non-slip stool. Keep the floor dry. Clean up any water that spills on the floor as soon as it happens. Remove soap buildup in the tub or shower regularly. Attach bath mats securely with double-sided non-slip rug tape. Do not have throw rugs and other things on the floor that can make you trip. What can I do in the bedroom? Use night lights. Make sure that you have a light by your bed that is easy to reach. Do not use any sheets or blankets that are too big for your bed. They should not hang down onto the floor. Have a firm chair that has side arms. You can use this for support while you get dressed. Do not have throw rugs and other things on the floor that can make you trip. What can I do in the kitchen? Clean up any spills right away. Avoid walking on wet floors. Keep items that you use a lot in easy-to-reach places. If you need to reach something above you, use a strong step stool that has a grab bar. Keep electrical cords out of the way. Do not use floor polish or wax that makes floors slippery. If  you must use wax, use non-skid floor wax. Do not have throw rugs and other things on the floor that can make you trip. What can I do with my stairs? Do not leave any items on the stairs. Make sure that there are handrails on both sides of the stairs and use them. Fix handrails that are broken or loose. Make sure that handrails are as long as the stairways. Check any carpeting to make sure that it is firmly attached to the stairs. Fix any carpet that is loose or worn. Avoid having throw rugs at the top or bottom of the stairs. If you do have throw rugs, attach them to the floor with carpet tape. Make sure that you have a light switch at the top of the stairs and  the bottom of the stairs. If you do not have them, ask someone to add them for you. What else can I do to help prevent falls? Wear shoes that: Do not have high heels. Have rubber bottoms. Are comfortable and fit you well. Are closed at the toe. Do not wear sandals. If you use a stepladder: Make sure that it is fully opened. Do not climb a closed stepladder. Make sure that both sides of the stepladder are locked into place. Ask someone to hold it for you, if possible. Clearly mark and make sure that you can see: Any grab bars or handrails. First and last steps. Where the edge of each step is. Use tools that help you move around (mobility aids) if they are needed. These include: Canes. Walkers. Scooters. Crutches. Turn on the lights when you go into a dark area. Replace any light bulbs as soon as they burn out. Set up your furniture so you have a clear path. Avoid moving your furniture around. If any of your floors are uneven, fix them. If there are any pets around you, be aware of where they are. Review your medicines with your doctor. Some medicines can make you feel dizzy. This can increase your chance of falling. Ask your doctor what other things that you can do to help prevent falls. This information is not intended to  replace advice given to you by your health care provider. Make sure you discuss any questions you have with your health care provider. Document Released: 03/03/2009 Document Revised: 10/13/2015 Document Reviewed: 06/11/2014 Elsevier Interactive Patient Education  2017 [employer].

## 2022-07-19 NOTE — Progress Notes (Signed)
I connected with  NINNA WILT on 07/19/22 by a audio enabled telemedicine application and verified that I am speaking with the correct person using two identifiers.  Patient Location: Home  Provider Location: Office/Clinic  I discussed the limitations of evaluation and management by telemedicine. The patient expressed understanding and agreed to proceed.  Subjective:   Sharon York is a 77 y.o. female who presents for Medicare Annual (Subsequent) preventive examination.  Review of Systems     Cardiac Risk Factors include: advanced age (>32mn, >>59women);diabetes mellitus;dyslipidemia;sedentary lifestyle;hypertension     Objective:    There were no vitals filed for this visit. There is no height or weight on file to calculate BMI.     07/19/2022    3:08 PM 07/12/2021    3:37 PM 11/23/2020    8:38 AM 07/11/2020    3:28 PM 12/15/2019   11:09 AM 09/29/2019   11:31 AM 06/29/2019    9:27 AM  Advanced Directives  Does Patient Have a Medical Advance Directive? Yes Yes No Yes No Yes No  Type of AParamedicof AMatt[place]Living will [place]Living will  [place] will  [place] will   Does patient want to make changes to medical advance directive? No - Patient declined        Copy of [place] Chart? Yes - validated most recent copy scanned in chart (See row information) Yes - validated most recent copy scanned in chart (See row information)  Yes - validated most recent copy scanned in chart (See row information)       Current Medications (verified) Outpatient Encounter Medications as of 07/19/2022  Medication Sig   ACCU-CHEK GUIDE test strip USE 1 STRIP TO CHECK GLUCOSE 4 TIMES DAILY   Accu-Chek Softclix Lancets lancets USE TO CHECK GLUCOSE UP TO 4 TIMES DAILY   alendronate (FOSAMAX) 70 MG tablet TAKE 1 TABLET BY MOUTH ONCE A WEEK. TAKE WITH A FULL GLASS OF WATER ON AN  EMPTY STOMACH.   aspirin 81 MG chewable tablet Chew 1 tablet by mouth daily.   atorvastatin (LIPITOR) 10 MG tablet Take 1 tablet (10 mg total) by mouth daily.   baclofen (LIORESAL) 10 MG tablet Take 1 tablet (10 mg total) by mouth 2 (two) times daily.   blood glucose meter kit and supplies Dispense based on patient and insurance preference. Use up to four times daily as directed. (FOR ICD-10 E10.9, E11.9).   Glucose Blood Automatic (POGO AUTOMATIC TEST CARTRIDGES) TEST 1 Cartridge by In Vitro route 3 (three) times daily.   lisinopril (ZESTRIL) 10 MG tablet Take 1 tablet by mouth once daily   metFORMIN (GLUCOPHAGE) 500 MG tablet TAKE 1 TABLET BY MOUTH ONCE DAILY WITH  BREAKFAST. REPLACES '250MG'$  DOSE   Multiple Vitamins-Minerals (MULTIVITAMIN ADULT PO) Take by mouth.   No facility-administered encounter medications on file as of 07/19/2022.    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   Past Surgical History:  [place]cedure Laterality Date   [place]  COLONOSCOPY  2010   normal  Family History  Problem Relation Age of Onset   Diabetes Mother    Thyroid disease Mother    Breast cancer Maternal Aunt    Breast cancer Cousin        mat cousin   Prostate cancer Brother    Kidney failure Brother    Kidney cancer Son    Bladder Cancer Neg Hx    Social History   Socioeconomic History   Marital status: Widowed    Spouse name: Not on file   Number of children: 5   Years of education: Not on file   Highest education level: 8th grade  Occupational History   Occupation: Retired  Tobacco Use   Smoking status:  Never    Passive exposure: Never   Smokeless tobacco: Never   Tobacco comments:    smoking cessation materials not required  Vaping Use   Vaping Use: Never used  Substance and Sexual Activity   Alcohol use: No    Alcohol/week: 0.0 standard drinks of alcohol   Drug use: Never   Sexual activity: Not Currently  Other Topics Concern   Not on file  Social History Narrative   Pt lives with her daughter   Social Determinants of Health   Financial Resource Strain: Low Risk  (07/19/2022)   Overall Financial Resource Strain (CARDIA)    Difficulty of Paying Living Expenses: Not hard at all  Food Insecurity: No Food Insecurity (07/19/2022)   Hunger Vital Sign    Worried About Running Out of Food in the Last Year: Never true    [place] in the Last Year: Never true  Transportation Needs: No Transportation Needs (07/19/2022)   PRAPARE - Hydrologist (Medical): No    Lack of Transportation (Non-Medical): No  Physical Activity: Insufficiently Active (07/19/2022)   Exercise Vital Sign    Days of Exercise per Week: 7 days    Minutes of Exercise per Session: 10 min  Stress: No Stress Concern Present (07/19/2022)   [place]    Feeling of Stress : Not at all  Social Connections: Moderately Isolated (07/19/2022)   Social Connection and Isolation Panel [NHANES]    Frequency of Communication with Friends and Family: Three times a week    Frequency of Social Gatherings with Friends and Family: Never    Attends Religious Services: More than 4 times per year    Active Member of Clubs or Organizations: No    Attends Archivist Meetings: Never    Marital Status: Widowed    Tobacco Counseling Counseling given: Not Answered Tobacco comments: smoking cessation materials not required   Clinical Intake:  Pre-visit preparation completed: Yes  Pain : No/denies pain     Nutritional Risks:  None Diabetes: Yes CBG done?: No Did pt. bring in CBG monitor from home?: No  How often do you need to have someone help you when you read instructions, pamphlets, or other written materials from your doctor or pharmacy?: 1 - Never  Diabetic?yes Nutrition Risk Assessment:  Has the patient had any N/V/D within the last 2 months?  No  Does the patient have any non-healing wounds?  No  Has the patient had any unintentional weight loss or weight gain?  No   Diabetes:  Is the patient diabetic?  Yes  If diabetic, was a CBG obtained today?  No  Did the patient bring in their glucometer from home?  No  How  often do you monitor your CBG's? 3x a day.   Financial Strains and Diabetes Management:  Are you having any financial strains with the device, your supplies or your medication? No .  Does the patient want to be seen by Chronic Care Management for management of their diabetes?  No  Would the patient like to be referred to a Nutritionist or for Diabetic Management?  No   Diabetic Exams:  Diabetic Eye Exam: Completed 03/01/22. Marland Kitchen Pt has been advised about the importance in completing this exam.  Diabetic Foot Exam: Completed 05/04/22. Pt has been advised about the importance in completing this exam.   Interpreter Needed?: No  Information entered by :: Kirke Shaggy, LPN   Activities of Daily Living    07/19/2022    3:09 PM 08/16/2021    9:04 AM  In your present state of health, do you have any difficulty performing the following activities:  Hearing? 0 1  Vision? 0 0  Difficulty concentrating or making decisions? 0 1  Walking or climbing stairs? 1 1  Dressing or bathing? 0 1  Doing errands, shopping? 0 0  Preparing Food and eating ? N   Using the Toilet? N   In the past six months, have you accidently leaked urine? N   Do you have problems with loss of bowel control? N   Managing your Medications? N   Managing your Finances? N   Housekeeping or managing your Housekeeping? N      Patient Care Team: [name], MD as PCP - General (Internal Medicine) [facility] as Consulting Physician (Ophthalmology) Laneta Simmers as Physician Assistant (Urology) Vanita Ingles, RN as Case Manager Vanita Ingles, RN as Case Manager (General Practice)  Indicate any recent Medical Services you may have received from other than [facility] providers in the past year (date may be approximate).     Assessment:   This is a routine wellness examination for [place].  Hearing/Vision screen Hearing Screening - Comments:: Wears aids Vision Screening - Comments:: Wears glasses- Dr.Cheek  Dietary issues and exercise activities discussed: Current Exercise Habits: Home exercise routine, Type of exercise: walking, Time (Minutes): 10, Frequency (Times/Week): 7, Weekly Exercise (Minutes/Week): 70, Intensity: Mild   Goals Addressed             This Visit's Progress    DIET - EAT MORE FRUITS AND VEGETABLES         Depression Screen    07/19/2022    3:05 PM 06/07/2022    4:23 PM 05/04/2022    8:02 AM 03/16/2022    3:51 PM 01/18/2022    9:08 AM 12/11/2021    9:12 AM 08/16/2021    9:04 AM  PHQ 2/9 Scores  PHQ - 2 Score 0 0 0 0 0 0 0  PHQ- 9 Score 0 0 0 0 0 0 0    Fall Risk    07/19/2022    3:09 PM 06/07/2022    4:22 PM 05/04/2022    8:03 AM 03/16/2022    3:51 PM 02/21/2022    3:07 PM  Fall Risk   Falls in the past year? 0 0 0 0 0  Number falls in past yr: 0 0 0 0 0  Injury with Fall? 0 0 0 0 0  Risk for fall due to : No Fall Risks Impaired balance/gait No Fall Risks No Fall Risks Impaired balance/gait  Follow up Falls prevention discussed;Falls evaluation completed Falls evaluation completed;Falls prevention discussed  Falls evaluation completed Falls evaluation completed Falls evaluation completed    FALL RISK PREVENTION PERTAINING TO THE HOME:  Any stairs in or around the home? Yes  If so, are there any without handrails? No  Home free of loose throw  rugs in walkways, pet beds, electrical cords, etc? Yes  Adequate lighting in your home to reduce risk of falls? Yes   ASSISTIVE DEVICES UTILIZED TO PREVENT FALLS:  Life alert? No  Use of a cane, walker or w/c? Yes uses cane & w/c Grab bars in the bathroom? Yes  Shower chair or bench in shower? Yes  Elevated toilet seat or a handicapped toilet? Yes   Cognitive Function:        07/19/2022    3:22 PM 01/12/2019   11:25 AM 01/08/2018   10:41 AM 01/07/2017    9:23 AM  6CIT Screen  What Year? 0 points 0 points 0 points 0 points  What month? 0 points 0 points 0 points 0 points  What time? 0 points 0 points 0 points 0 points  Count back from 20 0 points 0 points 0 points 0 points  Months in reverse 0 points 0 points 0 points 0 points  Repeat phrase 0 points 2 points 0 points 0 points  Total Score 0 points 2 points 0 points 0 points    Immunizations Immunization History  Administered Date(s) Administered   COVID-19, mRNA, vaccine(Comirnaty)12 years and older 03/16/2022   Fluad Quad(high Dose 65+) 01/16/2019, 02/11/2020, 02/02/2021, 02/21/2022   Influenza, High Dose Seasonal PF 02/06/2017, 01/22/2018   Influenza,inj,Quad PF,6+ Mos 02/18/2015   PFIZER(Purple Top)SARS-COV-2 Vaccination 06/01/2019, 06/22/2019, 02/25/2020   Pneumococcal Conjugate-13 06/22/2016   Pneumococcal Polysaccharide-23 06/11/2017   Tdap 07/10/2018   Zoster Recombinat (Shingrix) 01/30/2021, 04/05/2021    TDAP status: Up to date  Flu Vaccine status: Up to date  Pneumococcal vaccine status: Up to date  Covid-19 vaccine status: Completed vaccines  Qualifies for Shingles Vaccine? Yes   Zostavax completed No   Shingrix Completed?: Yes  Screening Tests Health Maintenance  Topic Date Due   COVID-19 Vaccine (5 - 2023-24 season) 05/11/2022   Diabetic kidney evaluation - Urine ACR  08/17/2022   HEMOGLOBIN A1C  11/03/2022   Diabetic kidney evaluation - eGFR measurement  12/12/2022   OPHTHALMOLOGY EXAM   03/02/2023   MAMMOGRAM  03/16/2023   FOOT EXAM  05/05/2023   Medicare Annual Wellness (AWV)  07/19/2023   DTaP/Tdap/Td (2 - Td or Tdap) 07/10/2028   Pneumonia Vaccine 63+ Years old  Completed   INFLUENZA VACCINE  Completed   DEXA SCAN  Completed   Hepatitis C Screening  Completed   Zoster Vaccines- Shingrix  Completed   HPV VACCINES  Aged Out   COLONOSCOPY (Pts 45-14yr Insurance coverage will need to be confirmed)  [place]aintenance Due  Topic Date Due   COVID-19 Vaccine (5 - 2023-24 season) 05/11/2022   Diabetic kidney evaluation - Urine ACR  08/17/2022    Colorectal cancer screening: No longer required.   Mammogram status: No longer required due to age.  Bone Density status: Completed 08/04/20. Results reflect: Bone density results: OSTEOPENIA. Repeat every 5 years.  Lung Cancer Screening: (Low Dose CT Chest recommended if Age 79-80years, 30 pack-year currently smoking OR have quit w/in 15years.) does not qualify.   Additional Screening:  Hepatitis C Screening: does qualify; Completed 06/11/17  Vision Screening: Recommended annual ophthalmology exams for early detection of glaucoma and other disorders  of the eye. Is the patient up to date with their annual eye exam?  Yes  Who is the provider or what is the name of the office in which the patient attends annual eye exams? Dr.Cheek If pt is not established with a provider, would they like to be referred to a provider to establish care? No .   Dental Screening: Recommended annual dental exams for proper oral hygiene  Community Resource Referral / Chronic Care Management: CRR required this visit?  No   CCM required this visit?  No      Plan:     I have personally reviewed and noted the following in the patient's chart:   Medical and social history Use of alcohol, tobacco or illicit drugs  Current medications and supplements including opioid prescriptions. Patient is not  currently taking opioid prescriptions. Functional ability and status Nutritional status Physical activity Advanced directives List of other physicians Hospitalizations, surgeries, and ER visits in previous 12 months Vitals Screenings to include cognitive, depression, and falls Referrals and appointments  In addition, I have reviewed and discussed with patient certain preventive protocols, quality metrics, and best practice recommendations. A written personalized care plan for preventive services as well as general preventive health recommendations were provided to patient.     Dionisio David, LPN   [id number]   Nurse Notes: none

## 2022-07-20 ENCOUNTER — Other Ambulatory Visit: Payer: Self-pay | Admitting: Internal Medicine

## 2022-07-20 DIAGNOSIS — M810 Age-related osteoporosis without current pathological fracture: Secondary | ICD-10-CM

## 2022-07-30 ENCOUNTER — Ambulatory Visit: Payer: Self-pay

## 2022-07-30 NOTE — Telephone Encounter (Signed)
  Chief Complaint: rash Symptoms: rash under L breast finger width wide and in center of breast, redness and itching Frequency: 1 week Pertinent Negatives: Patient denies warmth or pain Disposition: [] ED /[] Urgent Care (no appt availability in office) / [x] Appointment(In office/virtual)/ []  [place] [facility]/ [] Home Care/ [] Refused Recommended Disposition /[] [place] [facility]/ []  Follow-up with PCP Additional Notes: pt states she has been using Neosporin on area and hasn't seen improvement and wants to get seen before it gets worse. No appts with Dr. Army Melia so scheduled appt with Dr. Ronnald Ramp tomorrow at 0900. Care advice given and pt verbalized understanding.   Summary: Infection under Left Breast.   Pt is calling to report an infection under her Left breast. Pt states that it red with no pain. No available appts until Friday. Please advise         Reason for Disposition  Localized rash present > 7 days  Answer Assessment - Initial Assessment Questions 1. APPEARANCE of RASH: "Describe the rash."      Rash under L breast 2. LOCATION: "Where is the rash located?"      L breast  5. ONSET: "When did the rash start?"      1 week finger width in center 6. ITCHING: "Does the rash itch?" If Yes, ask: "How bad is the itch?"  (Scale 0-10; or none, mild, moderate, severe)     Mild  7. PAIN: "Does the rash hurt?" If Yes, ask: "How bad is the pain?"  (Scale 0-10; or none, mild, moderate, severe)    - NONE (0): no pain    - MILD (1-3): doesn't interfere with normal activities     - MODERATE (4-7): interferes with normal activities or awakens from sleep     - SEVERE (8-10): excruciating pain, unable to do any normal activities     0 8. OTHER SYMPTOMS: "Do you have any other symptoms?" (e.g., fever)     redness  Protocols used: Rash or Redness - Localized-A-AH

## 2022-07-31 ENCOUNTER — Telehealth: Payer: Self-pay | Admitting: Internal Medicine

## 2022-07-31 ENCOUNTER — Encounter: Payer: Self-pay | Admitting: Family Medicine

## 2022-07-31 ENCOUNTER — Ambulatory Visit (INDEPENDENT_AMBULATORY_CARE_PROVIDER_SITE_OTHER): Payer: 59 | Admitting: Family Medicine

## 2022-07-31 VITALS — BP 120/78 | HR 78 | Ht <= 58 in | Wt 160.0 lb

## 2022-07-31 DIAGNOSIS — B3789 Other sites of candidiasis: Secondary | ICD-10-CM

## 2022-07-31 MED ORDER — NYSTATIN-TRIAMCINOLONE 100000-0.1 UNIT/GM-% EX OINT: 1.0000 | TOPICAL_OINTMENT | Freq: Two times a day (BID) | CUTANEOUS | 1 refills | Status: DC

## 2022-07-31 MED ORDER — NYSTATIN 100000 UNIT/GM EX CREA: 1.0000 | TOPICAL_CREAM | Freq: Two times a day (BID) | CUTANEOUS | 1 refills | Status: DC

## 2022-07-31 NOTE — Telephone Encounter (Signed)
Copied from [place] [id number]. Topic: General - Other >> Jul 31, 2022 10:06 AM Oley Balm A wrote: Reason for CRM: Pharmacy states that they received two orders for the pt for nystatin. Pharmacy is wanting to clarify if they need to fill both prescriptions.  Please call pharmacy back.

## 2022-07-31 NOTE — Progress Notes (Signed)
Date:  07/31/2022   Name:  Sharon York   DOB:  09/05/1945   MRN:  [id number]   Chief Complaint: Rash (Yeast under breast- x 1 week. Using )  Rash This is a recurrent problem. The current episode started in the past 7 days. The problem has been gradually worsening since onset. The affected locations include the chest (left breast). The rash is characterized by redness and itchiness. She was exposed to nothing. Pertinent negatives include no shortness of breath.    Lab Results  Component Value Date   NA 142 12/11/2021   K 4.6 12/11/2021   CO2 26 12/11/2021   GLUCOSE 106 (H) 12/11/2021   BUN 11 12/11/2021   CREATININE 0.62 12/11/2021   CALCIUM 9.4 12/11/2021   EGFR 91 12/11/2021   GFRNONAA 79 12/03/2019   Lab Results  Component Value Date   CHOL 152 12/11/2021   HDL 72 12/11/2021   LDLCALC 64 12/11/2021   TRIG 83 12/11/2021   CHOLHDL 2.1 12/11/2021   Lab Results  Component Value Date   TSH 3.420 12/11/2021   Lab Results  Component Value Date   HGBA1C 6.4 (A) 05/04/2022   Lab Results  Component Value Date   WBC 4.8 12/11/2021   HGB 12.0 12/11/2021   HCT 37.0 12/11/2021   MCV 71 (L) 12/11/2021   PLT 269 12/11/2021   Lab Results  Component Value Date   ALT 13 12/11/2021   AST 16 12/11/2021   ALKPHOS 51 12/11/2021   BILITOT <0.2 12/11/2021   Lab Results  Component Value Date   VD25OH 123.2 (H) 12/11/2021     Review of Systems  HENT:  Negative for trouble swallowing.   Respiratory:  Negative for shortness of breath and wheezing.   Cardiovascular:  Negative for chest pain, palpitations and leg swelling.  Gastrointestinal:  Negative for abdominal pain and blood in stool.  Endocrine: Negative for polydipsia and polyuria.  Skin:  Positive for rash. Negative for color change, pallor and wound.    Patient Active Problem List   Diagnosis Date Noted   Trigger finger, right middle finger 12/11/2021   Microalbuminuria due to type 2 diabetes mellitus ([place])  12/07/2019   Type II diabetes mellitus with complication ([place]) [id number]   Right shoulder pain 03/31/2018   Hyperlipidemia associated with type 2 diabetes mellitus ([place]) 06/12/2017   Tinea corporis 03/17/2015   Abnormal gait 12/15/2014   Altered bowel function 12/15/2014   Hemiparesis affecting left side as late effect of cerebrovascular accident (CVA) ([place]) 12/15/2014   Obstructive sleep apnea of adult 12/15/2014   Arthritis of shoulder region, 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     Past Surgical History:  Procedure Laterality Date   [place]   COLONOSCOPY  2010   normal    Social History   Tobacco Use   Smoking status: Never    Passive exposure: Never   Smokeless tobacco: Never   Tobacco comments:    smoking cessation materials not required  Vaping Use   Vaping Use: Never used  Substance Use Topics   Alcohol use: No    Alcohol/week: 0.0 standard drinks of alcohol   Drug use: Never  Medication list has been reviewed and updated.  Current Meds  Medication Sig   ACCU-CHEK GUIDE test strip USE 1 STRIP TO CHECK GLUCOSE 4 TIMES DAILY   Accu-Chek Softclix Lancets lancets USE TO CHECK GLUCOSE UP TO 4 TIMES DAILY   alendronate (FOSAMAX) 70 MG tablet TAKE 1 TABLET BY MOUTH ONCE A WEEK WITH A FULL GLASS OF WATER ON AN EMPTY STOMACH   aspirin 81 MG chewable tablet Chew 1 tablet by mouth daily.   atorvastatin (LIPITOR) 10 MG tablet Take 1 tablet (10 mg total) by mouth daily.   baclofen (LIORESAL) 10 MG tablet Take 1 tablet (10 mg total) by mouth 2 (two) times daily.   blood glucose meter kit and supplies Dispense based on patient and insurance preference. Use up to four times  daily as directed. (FOR ICD-10 E10.9, E11.9).   Glucose Blood Automatic (POGO AUTOMATIC TEST CARTRIDGES) TEST 1 Cartridge by In Vitro route 3 (three) times daily.   lisinopril (ZESTRIL) 10 MG tablet Take 1 tablet by mouth once daily   metFORMIN (GLUCOPHAGE) 500 MG tablet TAKE 1 TABLET BY MOUTH ONCE DAILY WITH  BREAKFAST. REPLACES '250MG'$  DOSE   Multiple Vitamins-Minerals (MULTIVITAMIN ADULT PO) Take by mouth.       07/31/2022    9:08 AM 06/07/2022    4:23 PM 05/04/2022    8:03 AM 03/16/2022    3:51 PM  GAD 7 : Generalized Anxiety Score  Nervous, Anxious, on Edge 0 0 0 0  Control/stop worrying 0 0 0 0  Worry too much - different things 0 0 0 0  Trouble relaxing 0 0 0 0  Restless 0 0 0 0  Easily annoyed or irritable 0 0 0 0  Afraid - awful might happen 0 0 0 0  Total GAD 7 Score 0 0 0 0  Anxiety Difficulty Not difficult at all Not difficult at all Not difficult at all Not difficult at all       07/31/2022    9:08 AM 07/19/2022    3:05 PM 06/07/2022    4:23 PM  Depression screen PHQ 2/9  Decreased Interest 0 0 0  Down, Depressed, Hopeless 0 0 0  PHQ - 2 Score 0 0 0  Altered sleeping 0 0 0  Tired, decreased energy 0 0 0  Change in appetite 0 0 0  Feeling bad or failure about yourself  0 0 0  Trouble concentrating 0 0 0  Moving slowly or fidgety/restless 0 0 0  Suicidal thoughts 0 0 0  PHQ-9 Score 0 0 0  Difficult doing work/chores  Not difficult at all Not difficult at all    BP Readings from Last 3 Encounters:  07/31/22 120/78  06/07/22 126/80  05/04/22 138/78    Physical Exam HENT:     Head: Normocephalic.  Cardiovascular:     Rate and Rhythm: Normal rate and regular rhythm.     Pulses: Normal pulses.     Heart sounds: Normal heart sounds. No murmur heard.    No gallop.  Pulmonary:     Breath sounds: Normal breath sounds. No wheezing or rhonchi.  Abdominal:     General: There is no distension.  Skin:    Findings: Rash present.     Comments: Beneath left breast       Wt Readings from Last 3 Encounters:  07/31/22 160 lb (72.6 kg)  07/19/22 158 lb (71.7 kg)  06/07/22 158 lb (71.7 kg)    BP 120/78   Pulse  78   Ht '4\' 5"'$  (1.346 m)   Wt 160 lb (72.6 kg)   SpO2 96%   BMI 40.05 kg/m   Assessment and Plan: 1. Candidiasis of breast New onset.  Recurrent.  Stable.  Macular rash noted beneath left breast, consistent with candidiasis given the patient's diabetes this would be most likely.  We will treat with nystatin cream apply twice a day as needed patient has been given refill for recurrence. - nystatin cream (MYCOSTATIN); Apply 1 Application topically 2 (two) times daily.  Dispense: 30 g; Refill: 1     Otilio Miu, MD

## 2022-08-16 ENCOUNTER — Other Ambulatory Visit: Payer: Self-pay | Admitting: Internal Medicine

## 2022-08-16 NOTE — Telephone Encounter (Signed)
Requested Prescriptions  Pending Prescriptions Disp Refills   ACCU-CHEK GUIDE test strip [Pharmacy Med Name: Accu-Chek Guide In Vitro Strip] 100 each 2    Sig: USE 1 STRIP TO CHECK GLUCOSE 4 TIMES DAILY     Endocrinology: Diabetes - Testing Supplies Passed - 08/16/2022  9:16 AM      Passed - Valid encounter within last 12 months    Recent Outpatient Visits           2 weeks ago Candidiasis of breast   [facility] [place], Deanna C, MD   2 months ago Parotid duct obstruction   [facility] [place], Deanna C, MD   3 months ago Type II diabetes mellitus with complication [facility])   [facility], Jesse Sans, MD   5 months ago Hemiparesis affecting left side as late effect of cerebrovascular accident (CVA) [facility])   [place] at [facility], Jesse Sans, MD   5 months ago Hemiparesis affecting left side as late effect of cerebrovascular accident (CVA) [facility])   [place] at [facility], Jesse Sans, MD       Future Appointments             In 3 months Army Melia, Jesse Sans, MD [place] at [facility], [facility]

## 2022-10-11 ENCOUNTER — Ambulatory Visit (INDEPENDENT_AMBULATORY_CARE_PROVIDER_SITE_OTHER): Payer: 59 | Admitting: Internal Medicine

## 2022-10-11 ENCOUNTER — Encounter: Payer: Self-pay | Admitting: Internal Medicine

## 2022-10-11 VITALS — BP 126/72 | HR 88 | Ht <= 58 in | Wt 159.0 lb

## 2022-10-11 DIAGNOSIS — B354 Tinea corporis: Secondary | ICD-10-CM

## 2022-10-11 MED ORDER — NYSTATIN-TRIAMCINOLONE 100000-0.1 UNIT/GM-% EX OINT: 1.0000 | TOPICAL_OINTMENT | Freq: Two times a day (BID) | CUTANEOUS | 0 refills | Status: DC

## 2022-10-11 NOTE — Progress Notes (Signed)
Date:  10/11/2022   Name:  Sharon York   DOB:  11-12-1945   MRN:  [id number]   Chief Complaint: Rash (Itching under left armpit. X 1 week.Patient has been using Nystatin and Triamcinolone Ointment and this was making itching and rash worse. )  Rash This is a recurrent problem. The problem has been waxing and waning since onset. The affected locations include the left axilla. The rash is characterized by itchiness and redness. She was exposed to nothing. Pertinent negatives include no fever or shortness of breath. Treatments tried: nystatin cream but only one application.    Lab Results  Component Value Date   NA 142 12/11/2021   K 4.6 12/11/2021   CO2 26 12/11/2021   GLUCOSE 106 (H) 12/11/2021   BUN 11 12/11/2021   CREATININE 0.62 12/11/2021   CALCIUM 9.4 12/11/2021   EGFR 91 12/11/2021   GFRNONAA 79 12/03/2019   Lab Results  Component Value Date   CHOL 152 12/11/2021   HDL 72 12/11/2021   LDLCALC 64 12/11/2021   TRIG 83 12/11/2021   CHOLHDL 2.1 12/11/2021   Lab Results  Component Value Date   TSH 3.420 12/11/2021   Lab Results  Component Value Date   HGBA1C 6.4 (A) 05/04/2022   Lab Results  Component Value Date   WBC 4.8 12/11/2021   HGB 12.0 12/11/2021   HCT 37.0 12/11/2021   MCV 71 (L) 12/11/2021   PLT 269 12/11/2021   Lab Results  Component Value Date   ALT 13 12/11/2021   AST 16 12/11/2021   ALKPHOS 51 12/11/2021   BILITOT <0.2 12/11/2021   Lab Results  Component Value Date   VD25OH 123.2 (H) 12/11/2021     Review of Systems  Constitutional:  Negative for chills and fever.  Respiratory:  Negative for chest tightness and shortness of breath.   Cardiovascular:  Positive for leg swelling. Negative for chest pain.  Musculoskeletal:  Positive for gait problem. Negative for arthralgias and joint swelling.  Skin:  Positive for rash.  Neurological:  Positive for weakness.  Psychiatric/Behavioral:  Negative for dysphoric mood and sleep disturbance.  The patient is not nervous/anxious.     Patient Active Problem List   Diagnosis Date Noted   Trigger finger, right middle finger 12/11/2021   Microalbuminuria due to type 2 diabetes mellitus (HCC) 12/07/2019   Diabetes mellitus treated with oral medication (HCC) 04/21/2018   Right shoulder pain 03/31/2018   Hyperlipidemia associated with type 2 diabetes mellitus (HCC) 06/12/2017   Tinea corporis 03/17/2015   Abnormal gait 12/15/2014   Altered bowel function 12/15/2014   Hemiparesis affecting left side as late effect of cerebrovascular accident (CVA) (HCC) 12/15/2014   Obstructive sleep apnea of adult 12/15/2014   Arthritis of shoulder region, 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     Past Surgical History:  Procedure Laterality Date   CESAREAN SECTION  1975   COLONOSCOPY  2010   normal    Social History   Tobacco Use   Smoking status: Never    Passive exposure: Never   Smokeless tobacco: Never   Tobacco comments:    smoking cessation materials not required  Vaping Use  Vaping Use: Never used  Substance Use Topics   Alcohol use: No    Alcohol/week: 0.0 standard drinks of alcohol   Drug use: Never     Medication list has been reviewed and updated.  Current Meds  Medication Sig   Accu-Chek Softclix Lancets lancets USE TO CHECK GLUCOSE UP TO 4 TIMES DAILY   alendronate (FOSAMAX) 70 MG tablet TAKE 1 TABLET BY MOUTH ONCE A WEEK WITH A FULL GLASS OF WATER ON AN EMPTY STOMACH   aspirin 81 MG chewable tablet Chew 1 tablet by mouth daily.   atorvastatin (LIPITOR) 10 MG tablet Take 1 tablet (10 mg total) by mouth daily.   baclofen (LIORESAL) 10 MG tablet Take 1 tablet (10 mg total) by mouth  2 (two) times daily.   blood glucose meter kit and supplies Dispense based on patient and insurance preference. Use up to four times daily as directed. (FOR ICD-10 E10.9, E11.9).   glucose blood (ACCU-CHEK GUIDE) test strip USE 1 STRIP TO CHECK GLUCOSE 4 TIMES DAILY   Glucose Blood Automatic (POGO AUTOMATIC TEST CARTRIDGES) TEST 1 Cartridge by In Vitro route 3 (three) times daily.   lisinopril (ZESTRIL) 10 MG tablet Take 1 tablet by mouth once daily   metFORMIN (GLUCOPHAGE) 500 MG tablet TAKE 1 TABLET BY MOUTH ONCE DAILY WITH  BREAKFAST. REPLACES 250MG  DOSE   Multiple Vitamin (MULTI-VITAMIN) tablet Take 1 tablet by mouth daily.   nystatin cream (MYCOSTATIN) Apply 1 Application topically 2 (two) times daily.   nystatin-triamcinolone ointment (MYCOLOG) Apply 1 Application topically 2 (two) times daily.       10/11/2022   10:30 AM 07/31/2022    9:08 AM 06/07/2022    4:23 PM 05/04/2022    8:03 AM  GAD 7 : Generalized Anxiety Score  Nervous, Anxious, on Edge 0 0 0 0  Control/stop worrying 0 0 0 0  Worry too much - different things 0 0 0 0  Trouble relaxing 0 0 0 0  Restless 0 0 0 0  Easily annoyed or irritable 0 0 0 0  Afraid - awful might happen 0 0 0 0  Total GAD 7 Score 0 0 0 0  Anxiety Difficulty Not difficult at all Not difficult at all Not difficult at all Not difficult at all       10/11/2022   10:29 AM 07/31/2022    9:08 AM 07/19/2022    3:05 PM  Depression screen PHQ 2/9  Decreased Interest 0 0 0  Down, Depressed, Hopeless 0 0 0  PHQ - 2 Score 0 0 0  Altered sleeping 0 0 0  Tired, decreased energy 0 0 0  Change in appetite 0 0 0  Feeling bad or failure about yourself  0 0 0  Trouble concentrating 0 0 0  Moving slowly or fidgety/restless 0 0 0  Suicidal thoughts 0 0 0  PHQ-9 Score 0 0 0  Difficult doing work/chores Not difficult at all  Not difficult at all    BP Readings from Last 3 Encounters:  10/11/22 126/72  07/31/22 120/78  06/07/22 126/80    Physical  Exam Vitals and nursing note reviewed.  Constitutional:      General: She is not in acute distress.    Appearance: She is well-developed.  HENT:     Head: Normocephalic and atraumatic.  Cardiovascular:     Rate and Rhythm: Normal rate and regular rhythm.  Pulmonary:     Effort: Pulmonary effort is normal. No respiratory distress.  Breath sounds: No wheezing or rhonchi.  Skin:    General: Skin is warm and dry.     Findings: No rash.     Comments: Punctate red macular rash in left axilla Peeling skin with redness medial left foot  Neurological:     Mental Status: She is alert and oriented to person, place, and time.  Psychiatric:        Mood and Affect: Mood normal.        Behavior: Behavior normal.     Wt Readings from Last 3 Encounters:  10/11/22 159 lb (72.1 kg)  07/31/22 160 lb (72.6 kg)  07/19/22 158 lb (71.7 kg)    BP 126/72   Pulse 88   Ht 4\' 5"  (1.346 m)   Wt 159 lb (72.1 kg)   SpO2 98%   BMI 39.80 kg/m   Assessment and Plan:  Problem List Items Addressed This Visit     Tinea corporis - Primary    Recurrent rash now in axilla due to friction and moisture Recommend nystatin-TAC bid Wedge a pillow between elbow and trunk to allow some air flow and cooling of the left axilla      Relevant Medications   nystatin-triamcinolone ointment (MYCOLOG)    No follow-ups on file.   Partially dictated using Dragon software, any errors are not intentional.  Reubin Milan, MD [facility] [place], [place]

## 2022-10-11 NOTE — Assessment & Plan Note (Signed)
Recurrent rash now in axilla due to friction and moisture Recommend nystatin-TAC bid Wedge a pillow between elbow and trunk to allow some air flow and cooling of the left axilla

## 2022-10-11 NOTE — Patient Instructions (Signed)
Apply nystatin-triamcinolone ointment twice a day to rash under breast and on feet.

## 2022-11-08 ENCOUNTER — Other Ambulatory Visit: Payer: Self-pay | Admitting: Internal Medicine

## 2022-11-08 NOTE — Telephone Encounter (Signed)
Requested Prescriptions  Pending Prescriptions Disp Refills   ACCU-CHEK GUIDE test strip [Pharmacy Med Name: Accu-Chek Guide In Vitro Strip] 100 each 3    Sig: USE 1 STRIP TO CHECK GLUCOSE 4 TIMES DAILY     Endocrinology: Diabetes - Testing Supplies Passed - 11/08/2022 12:09 PM      Passed - Valid encounter within last 12 months    Recent Outpatient Visits           4 weeks ago Tinea corporis   [place] [facility] at [facility], Nyoka Cowden, MD   3 months ago Candidiasis of breast   [place] [facility]   5 months ago Parotid duct obstruction   [place] [facility] at [facility], [facility]   6 months ago Type II diabetes mellitus with complication [facility])   [place] [facility] at [facility], Nyoka Cowden, MD   7 months ago Hemiparesis affecting left side as late effect of cerebrovascular accident (CVA) [facility])   [place] [facility] at [facility], Nyoka Cowden, MD       Future Appointments             In 1 month Judithann Piggee, Nyoka Cowden, MD [facility] at [facility], [facility]

## 2022-11-30 ENCOUNTER — Other Ambulatory Visit: Payer: Self-pay | Admitting: Internal Medicine

## 2022-11-30 DIAGNOSIS — E118 Type 2 diabetes mellitus with unspecified complications: Secondary | ICD-10-CM

## 2022-12-03 NOTE — Telephone Encounter (Signed)
Requested Prescriptions  Refused Prescriptions Disp Refills   lisinopril (ZESTRIL) 2.5 MG tablet [Pharmacy Med Name: Lisinopril 2.5 MG Oral Tablet] 90 tablet 0    Sig: Take 1 tablet by mouth once daily     Cardiovascular:  ACE Inhibitors Failed - 11/30/2022  6:29 PM      Failed - Cr in normal range and within 180 days    Creatinine, Ser  Date Value Ref Range Status  12/11/2021 0.62 0.57 - 1.00 mg/dL Final         Failed - K in normal range and within 180 days    Potassium  Date Value Ref Range Status  12/11/2021 4.6 3.5 - 5.2 mmol/L Final         Passed - Patient is not pregnant      Passed - Last BP in normal range    BP Readings from Last 1 Encounters:  10/11/22 126/72         Passed - Valid encounter within last 6 months    Recent Outpatient Visits           1 month ago Tinea corporis   [place] [facility] at [facility], Nyoka Cowden, MD   4 months ago Candidiasis of breast   [place] [facility] at [facility], [facility]   5 months ago Parotid duct obstruction   [place] [facility] at [facility], MD   7 months ago Type II diabetes mellitus with complication [facility])   [place] [facility] at [facility], Nyoka Cowden, MD   8 months ago Hemiparesis affecting left side as late effect of cerebrovascular accident (CVA) [facility])   [place] [facility] at [facility], Nyoka Cowden, MD       Future Appointments             In 1 week Judithann Drab, Nyoka Cowden, MD [facility] at [facility], [facility]

## 2022-12-08 ENCOUNTER — Other Ambulatory Visit: Payer: Self-pay | Admitting: Internal Medicine

## 2022-12-08 DIAGNOSIS — E118 Type 2 diabetes mellitus with unspecified complications: Secondary | ICD-10-CM

## 2022-12-10 ENCOUNTER — Other Ambulatory Visit: Payer: Self-pay | Admitting: Internal Medicine

## 2022-12-10 DIAGNOSIS — E118 Type 2 diabetes mellitus with unspecified complications: Secondary | ICD-10-CM

## 2022-12-10 DIAGNOSIS — I10 Essential (primary) hypertension: Secondary | ICD-10-CM

## 2022-12-10 NOTE — Telephone Encounter (Signed)
Medication Refill - Medication: lisinopril (ZESTRIL) 10 MG tablet and glucose blood (ACCU-CHEK GUIDE) test strip   Has the patient contacted their pharmacy? No.   Preferred Pharmacy (with phone number or street name):  [facility] [address], [place] - [address] Phone: [phone]  Fax: [phone]     Has the patient been seen for an appointment in the last year OR does the patient have an upcoming appointment? Yes.    Agent: Please be advised that RX refills may take up to 3 business days. We ask that you follow-up with your pharmacy.  Patient stated she is going out of town tomorrow and need enough medication to last her until her next appt. She will be back for her appt on Thursday.

## 2022-12-11 NOTE — Telephone Encounter (Signed)
Unable to refill per protocol, Rx request is too soon. Last refill 07/05/22 for 90 days and 3 refills.  Requested Prescriptions  Pending Prescriptions Disp Refills   lisinopril (ZESTRIL) 10 MG tablet 90 tablet 3    Sig: Take 1 tablet (10 mg total) by mouth daily.     Cardiovascular:  ACE Inhibitors Failed - 12/10/2022  9:27 AM      Failed - Cr in normal range and within 180 days    Creatinine, Ser  Date Value Ref Range Status  12/11/2021 0.62 0.57 - 1.00 mg/dL Final         Failed - K in normal range and within 180 days    Potassium  Date Value Ref Range Status  12/11/2021 4.6 3.5 - 5.2 mmol/L Final         Passed - Patient is not pregnant      Passed - Last BP in normal range    BP Readings from Last 1 Encounters:  10/11/22 126/72         Passed - Valid encounter within last 6 months    Recent Outpatient Visits           2 months ago Tinea corporis   [place] [facility] at [facility], Nyoka Cowden, MD   4 months ago Candidiasis of breast   [place] [facility], [facility]   6 months ago Parotid duct obstruction   [place] [facility], MD   7 months ago Type II diabetes mellitus with complication [facility])   [place] [facility] at [facility], Nyoka Cowden, MD   9 months ago Hemiparesis affecting left side as late effect of cerebrovascular accident (CVA) [facility])   [place] [facility] at [facility], Nyoka Cowden, MD       Future Appointments             In 2 days Reubin Milan, MD [facility] at [facility], [facility]             glucose blood (ACCU-CHEK GUIDE) test strip 100 each 3    Sig: Use as instructed     Endocrinology: Diabetes - Testing Supplies Passed - 12/10/2022  9:27 AM      Passed - Valid encounter within last 12  months    Recent Outpatient Visits           2 months ago Tinea corporis   [place] [facility] at [facility], Nyoka Cowden, MD   4 months ago Candidiasis of breast   [place] [facility], [facility]   6 months ago Parotid duct obstruction   [place] [facility], MD   7 months ago Type II diabetes mellitus with complication [facility])    Primary Care & Sports Medicine at [facility], Nyoka Cowden, MD   9 months ago Hemiparesis affecting left side as late effect of cerebrovascular accident (CVA) [facility])   [facility] at [facility], Nyoka Cowden, MD       Future Appointments             In 2 days Bari Edward  Rexene Edison, MD [facility] Medicine at [facility], [facility]

## 2022-12-13 ENCOUNTER — Encounter: Payer: Self-pay | Admitting: Internal Medicine

## 2022-12-13 ENCOUNTER — Ambulatory Visit (INDEPENDENT_AMBULATORY_CARE_PROVIDER_SITE_OTHER): Payer: 59 | Admitting: Internal Medicine

## 2022-12-13 ENCOUNTER — Ambulatory Visit: Payer: Self-pay

## 2022-12-13 VITALS — BP 118/78 | HR 71 | Ht <= 58 in | Wt 158.0 lb

## 2022-12-13 DIAGNOSIS — E118 Type 2 diabetes mellitus with unspecified complications: Secondary | ICD-10-CM

## 2022-12-13 DIAGNOSIS — E785 Hyperlipidemia, unspecified: Secondary | ICD-10-CM | POA: Diagnosis not present

## 2022-12-13 DIAGNOSIS — Z7984 Long term (current) use of oral hypoglycemic drugs: Secondary | ICD-10-CM

## 2022-12-13 DIAGNOSIS — I69354 Hemiplegia and hemiparesis following cerebral infarction affecting left non-dominant side: Secondary | ICD-10-CM

## 2022-12-13 DIAGNOSIS — E119 Type 2 diabetes mellitus without complications: Secondary | ICD-10-CM | POA: Diagnosis not present

## 2022-12-13 DIAGNOSIS — Z0000 Encounter for general adult medical examination without abnormal findings: Secondary | ICD-10-CM

## 2022-12-13 DIAGNOSIS — E1169 Type 2 diabetes mellitus with other specified complication: Secondary | ICD-10-CM

## 2022-12-13 DIAGNOSIS — Z23 Encounter for immunization: Secondary | ICD-10-CM

## 2022-12-13 DIAGNOSIS — I10 Essential (primary) hypertension: Secondary | ICD-10-CM

## 2022-12-13 DIAGNOSIS — Z1231 Encounter for screening mammogram for malignant neoplasm of breast: Secondary | ICD-10-CM

## 2022-12-13 NOTE — Patient Instructions (Signed)
Call [facility] to schedule your mammogram at [phone].  

## 2022-12-13 NOTE — Telephone Encounter (Signed)
[facility] called and spoke to Vernona Rieger, Pharmacist about the refill(s) Lisinopril 10mg   requested. Advised it was sent on 07/05/22 #90/3 refill(s). Vernona Rieger stated that the patient originally  request the 2.5mg  lisinopril. Patient has refills available on the 10mg  lisinopril and she will process the refill request at this time. Pharmacy will notify patient when it is ready for pick-up

## 2022-12-13 NOTE — Progress Notes (Signed)
Date:  12/13/2022   Name:  Sharon York   DOB:  07-Jan-1946   MRN:  [id number]   Chief Complaint: Annual Exam Sharon York is a 77 y.o. female who presents today for her Complete Annual Exam. She feels well. She reports exercising - walking at night. She reports she is sleeping well. Breast complaints - none.  Mammogram: 02/2022 DEXA: 07/2020 osteopenia hip, normal spine Colonoscopy: 09/2018 virtual   Health Maintenance Due  Topic Date Due   COVID-19 Vaccine (5 - 2023-24 season) 05/11/2022   Diabetic kidney evaluation - Urine ACR  08/17/2022   HEMOGLOBIN A1C  11/03/2022   Diabetic kidney evaluation - eGFR measurement  12/12/2022    Immunization History  Administered Date(s) Administered   COVID-19, mRNA, vaccine(Comirnaty)12 years and older 03/16/2022   Fluad Quad(high Dose 65+) 01/16/2019, 02/11/2020, 02/02/2021, 02/21/2022   Influenza, High Dose Seasonal PF 02/06/2017, 01/22/2018   Influenza,inj,Quad PF,6+ Mos 02/18/2015   PFIZER(Purple Top)SARS-COV-2 Vaccination 06/01/2019, 06/22/2019, 02/25/2020   PNEUMOCOCCAL CONJUGATE-20 12/13/2022   Pneumococcal Conjugate-13 06/22/2016   Pneumococcal Polysaccharide-23 06/11/2017   Tdap 07/10/2018   Zoster Recombinant(Shingrix) 01/30/2021, 04/05/2021    Diabetes She presents for her follow-up diabetic visit. She has type 2 diabetes mellitus. Pertinent negatives for hypoglycemia include no dizziness, headaches, nervousness/anxiousness or tremors. Associated symptoms include weakness. Pertinent negatives for diabetes include no chest pain, no fatigue, no polydipsia and no polyuria. Current diabetic treatment includes oral agent (monotherapy).  Hyperlipidemia This is a chronic problem. The problem is controlled. Pertinent negatives include no chest pain or shortness of breath.    Lab Results  Component Value Date   NA 142 12/11/2021   K 4.6 12/11/2021   CO2 26 12/11/2021   GLUCOSE 106 (H) 12/11/2021   BUN 11 12/11/2021    CREATININE 0.62 12/11/2021   CALCIUM 9.4 12/11/2021   EGFR 91 12/11/2021   GFRNONAA 79 12/03/2019   Lab Results  Component Value Date   CHOL 152 12/11/2021   HDL 72 12/11/2021   LDLCALC 64 12/11/2021   TRIG 83 12/11/2021   CHOLHDL 2.1 12/11/2021   Lab Results  Component Value Date   TSH 3.420 12/11/2021   Lab Results  Component Value Date   HGBA1C 6.4 (A) 05/04/2022   Lab Results  Component Value Date   WBC 4.8 12/11/2021   HGB 12.0 12/11/2021   HCT 37.0 12/11/2021   MCV 71 (L) 12/11/2021   PLT 269 12/11/2021   Lab Results  Component Value Date   ALT 13 12/11/2021   AST 16 12/11/2021   ALKPHOS 51 12/11/2021   BILITOT <0.2 12/11/2021   Lab Results  Component Value Date   VD25OH 123.2 (H) 12/11/2021     Review of Systems  Constitutional:  Negative for chills, fatigue and fever.  HENT:  Negative for congestion, hearing loss, tinnitus, trouble swallowing and voice change.   Eyes:  Negative for visual disturbance.  Respiratory:  Negative for cough, chest tightness, shortness of breath and wheezing.   Cardiovascular:  Negative for chest pain, palpitations and leg swelling.  Gastrointestinal:  Negative for abdominal pain, constipation, diarrhea and vomiting.  Endocrine: Negative for polydipsia and polyuria.  Genitourinary:  Negative for dysuria, frequency and genital sores.  Musculoskeletal:  Positive for arthralgias and gait problem. Negative for joint swelling.  Skin:  Negative for color change and rash.  Neurological:  Positive for weakness. Negative for dizziness, tremors, light-headedness and headaches.  Hematological:  Negative for adenopathy. Does not bruise/bleed easily.  Psychiatric/Behavioral:  Negative for dysphoric mood and sleep disturbance. The patient is not nervous/anxious.     Patient Active Problem List   Diagnosis Date Noted   Trigger finger, right middle finger 12/11/2021   Microalbuminuria due to type 2 diabetes mellitus (HCC) 12/07/2019    Diabetes mellitus treated with oral medication (HCC) 04/21/2018   Right shoulder pain 03/31/2018   Hyperlipidemia associated with type 2 diabetes mellitus (HCC) 06/12/2017   Tinea corporis 03/17/2015   Abnormal gait 12/15/2014   Altered bowel function 12/15/2014   Hemiparesis affecting left side as late effect of cerebrovascular accident (CVA) (HCC) 12/15/2014   Obstructive sleep apnea of adult 12/15/2014   Arthritis of shoulder region, 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     Past Surgical History:  Procedure Laterality Date   CESAREAN SECTION  1975   COLONOSCOPY  2010   normal    Social History   Tobacco Use   Smoking status: Never    Passive exposure: Never   Smokeless tobacco: Never   Tobacco comments:    smoking cessation materials not required  Vaping Use   Vaping status: Never Used  Substance Use Topics   Alcohol use: No    Alcohol/week: 0.0 standard drinks of alcohol   Drug use: Never     Medication list has been reviewed and updated.  Current Meds  Medication Sig   Accu-Chek Softclix Lancets lancets USE TO CHECK GLUCOSE UP TO 4 TIMES DAILY   alendronate (FOSAMAX) 70 MG tablet TAKE 1 TABLET BY MOUTH ONCE A WEEK WITH A FULL GLASS OF WATER ON AN EMPTY STOMACH   aspirin 81 MG chewable tablet Chew 1 tablet by mouth daily.   atorvastatin (LIPITOR) 10 MG tablet Take 1 tablet (10 mg total) by mouth daily.   baclofen (LIORESAL) 10 MG tablet Take 1 tablet (10 mg total) by mouth 2 (two) times daily.   blood glucose meter kit and supplies Dispense based on patient and insurance preference. Use up to four times daily as directed. (FOR ICD-10 E10.9, E11.9).   glucose blood (ACCU-CHEK GUIDE)  test strip USE 1 STRIP TO CHECK GLUCOSE 4 TIMES DAILY   Glucose Blood Automatic (POGO AUTOMATIC TEST CARTRIDGES) TEST 1 Cartridge by In Vitro route 3 (three) times daily.   lisinopril (ZESTRIL) 10 MG tablet Take 1 tablet by mouth once daily   metFORMIN (GLUCOPHAGE) 500 MG tablet TAKE 1 TABLET BY MOUTH ONCE DAILY WITH  BREAKFAST. REPLACES 250MG  DOSE   Multiple Vitamin (MULTI-VITAMIN) tablet Take 1 tablet by mouth daily.   nystatin cream (MYCOSTATIN) Apply 1 Application topically 2 (two) times daily.   nystatin-triamcinolone ointment (MYCOLOG) Apply 1 Application topically 2 (two) times daily.       12/13/2022    9:15 AM 10/11/2022   10:30 AM 07/31/2022    9:08 AM 06/07/2022    4:23 PM  GAD 7 : Generalized Anxiety Score  Nervous, Anxious, on Edge 0 0 0 0  Control/stop worrying 0 0 0 0  Worry too much - different things 0 0 0 0  Trouble relaxing 0 0 0 0  Restless 0 0 0 0  Easily annoyed or irritable 0 0 0 0  Afraid - awful  might happen 0 0 0 0  Total GAD 7 Score 0 0 0 0  Anxiety Difficulty Not difficult at all Not difficult at all Not difficult at all Not difficult at all       12/13/2022    9:15 AM 10/11/2022   10:29 AM 07/31/2022    9:08 AM  Depression screen PHQ 2/9  Decreased Interest 0 0 0  Down, Depressed, Hopeless 0 0 0  PHQ - 2 Score 0 0 0  Altered sleeping 0 0 0  Tired, decreased energy 0 0 0  Change in appetite 0 0 0  Feeling bad or failure about yourself  0 0 0  Trouble concentrating 0 0 0  Moving slowly or fidgety/restless 0 0 0  Suicidal thoughts 0 0 0  PHQ-9 Score 0 0 0  Difficult doing work/chores Not difficult at all Not difficult at all     BP Readings from Last 3 Encounters:  12/13/22 118/78  10/11/22 126/72  07/31/22 120/78    Physical Exam Vitals and nursing note reviewed.  Constitutional:      General: She is not in acute distress.    Appearance: She is well-developed.  HENT:     Head: Normocephalic and atraumatic.     Right Ear: Tympanic  membrane and ear canal normal.     Left Ear: Tympanic membrane and ear canal normal.     Nose:     Right Sinus: No maxillary sinus tenderness.     Left Sinus: No maxillary sinus tenderness.  Eyes:     General: No scleral icterus.       Right eye: No discharge.        Left eye: No discharge.     Conjunctiva/sclera: Conjunctivae normal.  Neck:     Thyroid: No thyromegaly.     Vascular: No carotid bruit.  Cardiovascular:     Rate and Rhythm: Normal rate and regular rhythm.     Pulses: Normal pulses.     Heart sounds: Normal heart sounds.  Pulmonary:     Effort: Pulmonary effort is normal. No respiratory distress.     Breath sounds: No wheezing.  Abdominal:     General: Bowel sounds are normal.     Palpations: Abdomen is soft.     Tenderness: There is no abdominal tenderness.  Musculoskeletal:     Cervical back: Normal range of motion. No erythema.     Right lower leg: No edema.     Left lower leg: No edema.  Lymphadenopathy:     Cervical: No cervical adenopathy.  Skin:    General: Skin is warm and dry.     Findings: No rash.  Neurological:     Mental Status: She is alert and oriented to person, place, and time. Mental status is at baseline.     Cranial Nerves: No cranial nerve deficit.     Sensory: No sensory deficit.     Deep Tendon Reflexes: Reflexes are normal and symmetric.     Comments: Left foot drop and LUE paresis  Psychiatric:        Attention and Perception: Attention normal.        Mood and Affect: Mood normal.    Diabetic Foot Exam - Simple   Simple Foot Form Visual Inspection No deformities, no ulcerations, no other skin breakdown bilaterally: Yes Sensation Testing Intact to touch and monofilament testing bilaterally: Yes Pulse Check Posterior Tibialis and Dorsalis pulse intact bilaterally: Yes Comments      Wt  Readings from Last 3 Encounters:  12/13/22 158 lb (71.7 kg)  10/11/22 159 lb (72.1 kg)  07/31/22 160 lb (72.6 kg)    BP 118/78   Pulse  71   Ht 4\' 5"  (1.346 m)   Wt 158 lb (71.7 kg)   SpO2 97%   BMI 39.55 kg/m   Assessment and Plan:  Problem List Items Addressed This Visit     Hyperlipidemia associated with type 2 diabetes mellitus (HCC) (Chronic)    LDL is  Lab Results  Component Value Date   LDLCALC 64 12/11/2021   Currently being treated with atorvastatin with good compliance and no concerns.       Relevant Orders   Comprehensive metabolic panel   Lipid panel   Hemiparesis affecting left side as late effect of cerebrovascular accident (CVA) (HCC) (Chronic)    No change in LUE paresis and left foot drop. On Statin and aspirin therapy      Diabetes mellitus treated with oral medication (HCC)    Blood sugars stable without hypoglycemic symptoms or events. Current regimen is metformin. Changes made last visit are none. Lab Results  Component Value Date   HGBA1C 6.4 (A) 05/04/2022         Relevant Orders   CBC with Differential/Platelet   Comprehensive metabolic panel   Hemoglobin A1c   TSH   Microalbumin / creatinine urine ratio   Other Visit Diagnoses     Annual physical exam    -  Primary   Stable exam vaccinations updated today with Prevnar-20   Encounter for screening mammogram for breast cancer       Relevant Orders   MM 3D SCREENING MAMMOGRAM BILATERAL BREAST   Need for vaccination for pneumococcus       Relevant Orders   Pneumococcal conjugate vaccine 20-valent (Completed)       Return in about 4 months (around 04/15/2023) for HTN, DM.    Reubin Milan, MD [facility] [place]

## 2022-12-13 NOTE — Assessment & Plan Note (Signed)
LDL is  Lab Results  Component Value Date   LDLCALC 64 12/11/2021   Currently being treated with atorvastatin with good compliance and no concerns.

## 2022-12-13 NOTE — Telephone Encounter (Signed)
Chief Complaint: Medication Question and refill request  Symptoms: None Disposition: [] ED /[] Urgent Care (no appt availability in office) / [] Appointment(In office/virtual)/ []  [place] [facility]/ [] Home Care/ [] Refused Recommended Disposition /[] [place] [facility]/ [x]  Follow-up with PCP Additional Notes: Stated "I am completely out of my lisinopril 10mg   prescription, should I be taking that with my other medications?" I advised patient that she should be taking lisinopril 10mg  daily as prescribed. Patient stated she needs a refill sent to [facility] pharmacy in [place].  Answer Assessment - Initial Assessment Questions 1. NAME of MEDICINE: "What medicine(s) are you calling about?"     Lisinopril 2.5mg  daily  2. QUESTION: "What is your question?" (e.g., double dose of medicine, side effect)     Can I take all my medication like I was taking before she changed my dose of linsinopril to 10mg   3. PRESCRIBER: "Who prescribed the medicine?" Reason: if prescribed by specialist, call should be referred to that group.     Dr. Judithann Kazlauskas 4. SYMPTOMS: "Do you have any symptoms?" If Yes, ask: "What symptoms are you having?"  "How bad are the symptoms (e.g., mild, moderate, severe)     None  Answer Assessment - Initial Assessment Questions 1. DRUG NAME: "What medicine do you need to have refilled?"     Lisinopril 10mg   2. REFILLS REMAINING: "How many refills are remaining?" (Note: The label on the medicine or pill bottle will show how many refills are remaining. If there are no refills remaining, then a renewal may be needed.)     0 3. EXPIRATION DATE: "What is the expiration date?" (Note: The label states when the prescription will expire, and thus can no longer be refilled.)     none 4. PRESCRIBING HCP: "Who prescribed it?" Reason: If prescribed by specialist, call should be referred to that group.     Dr. Judithann Cicalese 5. SYMPTOMS: "Do you have any symptoms?"     None  Protocols used: Medication  Question Call-A-AH, Medication Refill and Renewal Call-A-AH

## 2022-12-13 NOTE — Assessment & Plan Note (Signed)
No change in LUE paresis and left foot drop. On Statin and aspirin therapy

## 2022-12-13 NOTE — Assessment & Plan Note (Signed)
Blood sugars stable without hypoglycemic symptoms or events. Current regimen is metformin. Changes made last visit are none. Lab Results  Component Value Date   HGBA1C 6.4 (A) 05/04/2022

## 2022-12-14 ENCOUNTER — Ambulatory Visit: Payer: Self-pay | Admitting: *Deleted

## 2022-12-14 LAB — HEMOGLOBIN A1C: Hgb A1c MFr Bld: 7.5 % — ABNORMAL HIGH (ref 4.8–5.6)

## 2022-12-14 LAB — CBC WITH DIFFERENTIAL/PLATELET
Immature Grans (Abs): 0 10*3/uL (ref 0.0–0.1)
RBC: 5.31 x10E6/uL — ABNORMAL HIGH (ref 3.77–5.28)

## 2022-12-14 LAB — COMPREHENSIVE METABOLIC PANEL: Total Protein: 6.9 g/dL (ref 6.0–8.5)

## 2022-12-14 NOTE — Telephone Encounter (Signed)
Message from Phill Myron sent at 12/14/2022 11:58 AM EDT  Summary: discuss medication   Would like to discuss the medication the provider prescribed     (lisinopril (ZESTRIL) 10 MG tablet)        Called pt and unable to LM, busy signal.

## 2022-12-14 NOTE — Telephone Encounter (Signed)
Summary: discuss medication   Would like to discuss the medication the provider prescribed     (lisinopril (ZESTRIL) 10 MG tablet)          Called patient [phone] to review medication questions. No answer, no vm available to leave message.

## 2022-12-14 NOTE — Telephone Encounter (Signed)
Message from Phill Myron sent at 12/14/2022 11:58 AM EDT  Summary: discuss medication   Would like to discuss the medication the provider prescribed     (lisinopril (ZESTRIL) 10 MG tablet)         Chief Complaint: pt thinks she forgot  to take 1 med Symptoms: none Frequency: n/a Pertinent Negatives: Patient denies n/a Disposition: [] ED /[] Urgent Care (no appt availability in office) / [] Appointment(In office/virtual)/ []  [place] [facility]/ [x] Home Care/ [] Refused Recommended Disposition /[] [place] [facility]/ []  Follow-up with PCP Additional Notes: discussed all medications pt is taking with daughter and was able to clarify what pt is supposed to take. Questions answered and daughter  and pt verbalized understanding.  Reason for Disposition . Caller has medicine question only, adult not sick, AND triager answers question  Answer Assessment - Initial Assessment Questions 1. NAME of MEDICINE: "What medicine(s) are you calling about?"     Pt's daughter called to do a medicine reconciliation. Pt thinks she fotgo t pill.  2. QUESTION: "What is your question?" (e.g., double dose of medicine, side effect)     See above  Protocols used: Medication Question Call-A-AH

## 2022-12-14 NOTE — Progress Notes (Signed)
Please read pt all labs there should be 2.  KP

## 2023-01-18 ENCOUNTER — Other Ambulatory Visit: Payer: Self-pay | Admitting: Internal Medicine

## 2023-01-18 DIAGNOSIS — E1169 Type 2 diabetes mellitus with other specified complication: Secondary | ICD-10-CM

## 2023-01-18 NOTE — Telephone Encounter (Signed)
Requested Prescriptions  Pending Prescriptions Disp Refills   atorvastatin (LIPITOR) 10 MG tablet [Pharmacy Med Name: Atorvastatin Calcium 10 MG Oral Tablet] 90 tablet 3    Sig: Take 1 tablet by mouth once daily     Cardiovascular:  Antilipid - Statins Failed - 01/18/2023  4:08 PM      Failed - Lipid Panel in normal range within the last 12 months    Cholesterol, Total  Date Value Ref Range Status  12/13/2022 169 100 - 199 mg/dL Final   LDL Chol Calc (NIH)  Date Value Ref Range Status  12/13/2022 83 0 - 99 mg/dL Final   HDL  Date Value Ref Range Status  12/13/2022 70 >39 mg/dL Final   Triglycerides  Date Value Ref Range Status  12/13/2022 90 0 - 149 mg/dL Final         Passed - Patient is not pregnant      Passed - Valid encounter within last 12 months    Recent Outpatient Visits           1 month ago Annual physical exam   [place] [facility] at [facility], Nyoka Cowden, MD   3 months ago Tinea corporis   [place] [facility] at [facility], Nyoka Cowden, MD   5 months ago Candidiasis of breast   [place] [facility] at [facility]   7 months ago Parotid duct obstruction   [place] [facility] at [facility]   8 months ago Type II diabetes mellitus with complication [facility])   [place] [facility] at [facility], Nyoka Cowden, MD       Future Appointments             In 3 months Judithann Bento, Nyoka Cowden, MD [facility] at [facility], [facility]   In 11 months Judithann Lafalce, Nyoka Cowden, MD [facility] at [facility], [facility]

## 2023-01-22 ENCOUNTER — Telehealth: Payer: Self-pay | Admitting: *Deleted

## 2023-01-22 NOTE — Telephone Encounter (Signed)
Pt given lab results per notes of Dr. Judithann Bisch from 12/13/22 on 01/22/23. Pt verbalized understanding that Hgb A1c 7.5. patient aware of urine protein is negative.  Patient would like to know if she needs any recommendations regarding elevated Hgb A1c. Please advise .

## 2023-01-25 ENCOUNTER — Telehealth: Payer: Self-pay | Admitting: Internal Medicine

## 2023-01-25 NOTE — Telephone Encounter (Signed)
Pt is requesting a list of all active medications be sent to [facility] as she will be using this pharmacy to have her medications mailed to her home. [facility] Phone: [phone] / Fax: [phone]  Pt asked that you add OTC medication Aspirin low dose, Centrum Women's Multivitamin for Women 50 Plus.  Please advise.

## 2023-01-25 NOTE — Telephone Encounter (Signed)
Faxed med list to [facility].

## 2023-01-28 ENCOUNTER — Ambulatory Visit: Payer: Self-pay

## 2023-01-28 NOTE — Telephone Encounter (Signed)
Chief Complaint: COVID exposure Symptoms: None Frequency: Saturday morning Pertinent Negatives: Patient denies symptoms Disposition: [] ED /[] Urgent Care (no appt availability in office) / [] Appointment(In office/virtual)/ []  [place] [facility]/ [x] Home Care/ [] Refused Recommended Disposition /[] [place] [facility]/ []  Follow-up with PCP Additional Notes: Patient says her friend was around his uncle who has tested positive for COVID. The friend came to her house on Saturday and she took a newspaper from him from the front door. No physical contact with him, he did not come in the house. She was wearing a mask, he was not. She asked should she go get COVID tested. Advised due to no physical contact and no symptoms, just monitor this week and no test is needed. Call office if symptoms start. Advised I will send this to Dr. Judithann Kurowski for review and someone will call with her recommendation if different than what I already recommended. Patient verbalized understanding.'   Summary: around someone who has covid   Pt states she has been around someone on Sat.  She wants to know should she come in and get checked? Pt is not having any covid symptoms.     Reason for Disposition  [1] COVID-19 EXPOSURE within last 14 days AND [2] NO symptoms  Answer Assessment - Initial Assessment Questions 1. COVID-19 EXPOSURE: "Please describe how you were exposed to someone with a COVID-19 infection."     Around a friend who was around a uncle who was diagnosed with COVID 2. PLACE of CONTACT: "Where were you when you were exposed to COVID-19?" (e.g., home, school, medical waiting room; which city?)     Home on Saturday morning 3. TYPE of CONTACT: "How much contact was there?" (e.g., sitting next to, live in same house, work in same office, same building)     [employer], handed a newspaper at the front door 4. DURATION of CONTACT: "How long were you in contact with the COVID-19 patient?" (e.g., a few seconds,  passed by person, a few minutes, 15 minutes or longer, live with the patient)     A few minutes 5. MASK: "Were you wearing a mask?" "Was the other person wearing a mask?" Note: wearing a mask reduces the risk of an otherwise close contact.     Yes I was, other person wasn't 6. DATE of CONTACT: "When did you have contact with a COVID-19 patient?" (e.g., how many days ago)     01/26/23 7. COMMUNITY SPREAD: "Do you live in or have you traveled to an area where there are lots of COVID-19 cases (community spread)?" (See public health department website, if unsure)       N/A 8. SYMPTOMS: "Do you have any symptoms?" (e.g., fever, cough, breathing difficulty, loss of taste or smell)     No 9. VACCINE: "Have you gotten the COVID-19 vaccine?" If Yes, ask: "Which one, how many shots, when did you get it?"     Yes took them all  10. HIGH RISK: "Do you have any heart or lung problems?" (e.g., asthma, COPD, heart failure) "Do you have a weak immune system or other risk factors?" (e.g., HIV positive, chemotherapy, renal failure, diabetes mellitus, sickle cell anemia, obesity)       Yes  Protocols used: Coronavirus (COVID-19) Exposure-A-AH

## 2023-01-28 NOTE — Telephone Encounter (Signed)
Message from [place] H sent at 01/28/2023  7:57 AM EDT  Summary: around someone who has covid   Pt states she has been around someone on Sat.  She wants to know should she come in and get checked? Pt is not having any covid symptoms.        Called pt twice but line was busy.

## 2023-02-01 ENCOUNTER — Ambulatory Visit (INDEPENDENT_AMBULATORY_CARE_PROVIDER_SITE_OTHER): Payer: 59 | Admitting: Internal Medicine

## 2023-02-01 ENCOUNTER — Encounter: Payer: Self-pay | Admitting: Internal Medicine

## 2023-02-01 VITALS — BP 118/78 | HR 72 | Ht <= 58 in | Wt 160.0 lb

## 2023-02-01 DIAGNOSIS — I69354 Hemiplegia and hemiparesis following cerebral infarction affecting left non-dominant side: Secondary | ICD-10-CM | POA: Diagnosis not present

## 2023-02-01 DIAGNOSIS — Z23 Encounter for immunization: Secondary | ICD-10-CM | POA: Diagnosis not present

## 2023-02-01 DIAGNOSIS — E1169 Type 2 diabetes mellitus with other specified complication: Secondary | ICD-10-CM

## 2023-02-01 DIAGNOSIS — E118 Type 2 diabetes mellitus with unspecified complications: Secondary | ICD-10-CM

## 2023-02-01 DIAGNOSIS — Z7984 Long term (current) use of oral hypoglycemic drugs: Secondary | ICD-10-CM

## 2023-02-01 DIAGNOSIS — M810 Age-related osteoporosis without current pathological fracture: Secondary | ICD-10-CM

## 2023-02-01 DIAGNOSIS — E785 Hyperlipidemia, unspecified: Secondary | ICD-10-CM | POA: Diagnosis not present

## 2023-02-01 DIAGNOSIS — I10 Essential (primary) hypertension: Secondary | ICD-10-CM | POA: Diagnosis not present

## 2023-02-01 MED ORDER — ACCU-CHEK GUIDE VI STRP
ORAL_STRIP | 3 refills | Status: DC
Start: 2023-02-01 — End: 2023-06-25

## 2023-02-01 MED ORDER — ALENDRONATE SODIUM 70 MG PO TABS
ORAL_TABLET | ORAL | 3 refills | Status: DC
Start: 2023-02-01 — End: 2024-01-06

## 2023-02-01 MED ORDER — METFORMIN HCL 500 MG PO TABS
500.0000 mg | ORAL_TABLET | Freq: Every day | ORAL | 3 refills | Status: DC
Start: 2023-02-01 — End: 2024-01-10

## 2023-02-01 MED ORDER — LISINOPRIL 10 MG PO TABS
10.0000 mg | ORAL_TABLET | Freq: Every day | ORAL | 3 refills | Status: DC
Start: 2023-02-01 — End: 2023-10-16

## 2023-02-01 MED ORDER — BACLOFEN 10 MG PO TABS
10.0000 mg | ORAL_TABLET | Freq: Two times a day (BID) | ORAL | 3 refills | Status: DC
Start: 2023-02-01 — End: 2023-12-06

## 2023-02-01 MED ORDER — ACCU-CHEK SOFTCLIX LANCETS MISC: 1 refills | Status: DC

## 2023-02-01 MED ORDER — ATORVASTATIN CALCIUM 10 MG PO TABS
10.0000 mg | ORAL_TABLET | Freq: Every day | ORAL | 3 refills | Status: DC
Start: 2023-02-01 — End: 2023-11-12

## 2023-02-01 NOTE — Patient Instructions (Signed)
Can take Claritin or Allegra daily as needed for runny nose.

## 2023-02-01 NOTE — Progress Notes (Signed)
Date:  02/01/2023   Name:  Sharon York   DOB:  1946/02/18   MRN:  [id number]   Chief Complaint: medication question (Which cream to use under breast) and Nasal Congestion (Runny nose for 2 days, no other SX) She wants to start using [facility] for medications and is requesting that all medications be sent in today. Rash This is a recurrent problem. The affected locations include the chest. Associated symptoms include rhinorrhea. Pertinent negatives include no congestion, cough, diarrhea or sore throat.  URI  This is a new problem. The current episode started in the past 7 days. The problem has been unchanged. Associated symptoms include a rash and rhinorrhea. Pertinent negatives include no congestion, coughing, diarrhea, headaches, sore throat or swollen glands.    Lab Results  Component Value Date   NA 142 12/13/2022   K 4.5 12/13/2022   CO2 27 12/13/2022   GLUCOSE 116 (H) 12/13/2022   BUN 11 12/13/2022   CREATININE 0.62 12/13/2022   CALCIUM 9.1 12/13/2022   EGFR 91 12/13/2022   GFRNONAA 79 12/03/2019   Lab Results  Component Value Date   CHOL 169 12/13/2022   HDL 70 12/13/2022   LDLCALC 83 12/13/2022   TRIG 90 12/13/2022   CHOLHDL 2.4 12/13/2022   Lab Results  Component Value Date   TSH 3.130 12/13/2022   Lab Results  Component Value Date   HGBA1C 7.5 (H) 12/13/2022   Lab Results  Component Value Date   WBC 5.0 12/13/2022   HGB 12.5 12/13/2022   HCT 38.4 12/13/2022   MCV 72 (L) 12/13/2022   PLT 271 12/13/2022   Lab Results  Component Value Date   ALT 16 12/13/2022   AST 17 12/13/2022   ALKPHOS 47 12/13/2022   BILITOT 0.3 12/13/2022   Lab Results  Component Value Date   VD25OH 123.2 (H) 12/11/2021     Review of Systems  HENT:  Positive for rhinorrhea. Negative for congestion and sore throat.   Respiratory:  Negative for cough.   Gastrointestinal:  Negative for diarrhea.  Musculoskeletal:  Negative for arthralgias.  Skin:  Positive for rash.   Neurological:  Negative for headaches.  Psychiatric/Behavioral:  Negative for dysphoric mood and sleep disturbance. The patient is not nervous/anxious.     Patient Active Problem List   Diagnosis Date Noted   Trigger finger, right middle finger 12/11/2021   Microalbuminuria due to type 2 diabetes mellitus (HCC) 12/07/2019   Diabetes mellitus treated with oral medication (HCC) 04/21/2018   Right shoulder pain 03/31/2018   Hyperlipidemia associated with type 2 diabetes mellitus (HCC) 06/12/2017   Tinea corporis 03/17/2015   Abnormal gait 12/15/2014   Altered bowel function 12/15/2014   Hemiparesis affecting left side as late effect of cerebrovascular accident (CVA) (HCC) 12/15/2014   Obstructive sleep apnea of adult 12/15/2014   Arthritis of shoulder region, 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     Past Surgical History:  Procedure Laterality Date   CESAREAN SECTION  1975   COLONOSCOPY  2010   normal    Social History   Tobacco Use   Smoking status: Never    Passive exposure: Never   Smokeless tobacco: Never  Tobacco comments:    smoking cessation materials not required  Vaping Use   Vaping status: Never Used  Substance Use Topics   Alcohol use: No    Alcohol/week: 0.0 standard drinks of alcohol   Drug use: Never     Medication list has been reviewed and updated.  Current Meds  Medication Sig   aspirin 81 MG chewable tablet Chew 1 tablet by mouth daily.   blood glucose meter kit and supplies Dispense based on patient and insurance preference. Use up to four times daily as directed. (FOR ICD-10 E10.9, E11.9).   Multiple Vitamin (MULTI-VITAMIN) tablet Take 1 tablet by mouth daily.    nystatin cream (MYCOSTATIN) Apply 1 Application topically 2 (two) times daily.   nystatin-triamcinolone ointment (MYCOLOG) Apply 1 Application topically 2 (two) times daily.   [DISCONTINUED] Accu-Chek Softclix Lancets lancets USE TO CHECK GLUCOSE UP TO 4 TIMES DAILY   [DISCONTINUED] alendronate (FOSAMAX) 70 MG tablet TAKE 1 TABLET BY MOUTH ONCE A WEEK WITH A FULL GLASS OF WATER ON AN EMPTY STOMACH   [DISCONTINUED] atorvastatin (LIPITOR) 10 MG tablet Take 1 tablet by mouth once daily   [DISCONTINUED] baclofen (LIORESAL) 10 MG tablet Take 1 tablet (10 mg total) by mouth 2 (two) times daily.   [DISCONTINUED] glucose blood (ACCU-CHEK GUIDE) test strip USE 1 STRIP TO CHECK GLUCOSE 4 TIMES DAILY   [DISCONTINUED] Glucose Blood Automatic (POGO AUTOMATIC TEST CARTRIDGES) TEST 1 Cartridge by In Vitro route 3 (three) times daily.   [DISCONTINUED] lisinopril (ZESTRIL) 10 MG tablet Take 1 tablet by mouth once daily   [DISCONTINUED] metFORMIN (GLUCOPHAGE) 500 MG tablet TAKE 1 TABLET BY MOUTH ONCE DAILY WITH  BREAKFAST. REPLACES 250MG  DOSE       02/01/2023    9:01 AM 12/13/2022    9:15 AM 10/11/2022   10:30 AM 07/31/2022    9:08 AM  GAD 7 : Generalized Anxiety Score  Nervous, Anxious, on Edge 0 0 0 0  Control/stop worrying 0 0 0 0  Worry too much - different things 0 0 0 0  Trouble relaxing 0 0 0 0  Restless 0 0 0 0  Easily annoyed or irritable 0 0 0 0  Afraid - awful might happen 0 0 0 0  Total GAD 7 Score 0 0 0 0  Anxiety Difficulty Not difficult at all Not difficult at all Not difficult at all Not difficult at all       02/01/2023    9:01 AM 12/13/2022    9:15 AM 10/11/2022   10:29 AM  Depression screen PHQ 2/9  Decreased Interest 0 0 0  Down, Depressed, Hopeless 0 0 0  PHQ - 2 Score 0 0 0  Altered sleeping 0 0 0  Tired, decreased energy 0 0 0  Change in appetite 0 0 0  Feeling bad or failure about yourself  0 0 0  Trouble concentrating 0 0 0  Moving slowly or fidgety/restless 0 0 0   Suicidal thoughts 0 0 0  PHQ-9 Score 0 0 0  Difficult doing work/chores Not difficult at all Not difficult at all Not difficult at all    BP Readings from Last 3 Encounters:  02/01/23 118/78  12/13/22 118/78  10/11/22 126/72    Physical Exam Vitals and nursing note reviewed.  Constitutional:      General: She is not in acute distress.    Appearance: Normal appearance. She is well-developed.  HENT:     Head: Normocephalic and atraumatic.  Right Ear: Tympanic membrane normal. Decreased hearing noted.     Left Ear: Tympanic membrane normal. Decreased hearing noted.     Nose:     Right Sinus: No maxillary sinus tenderness or frontal sinus tenderness.     Left Sinus: No maxillary sinus tenderness or frontal sinus tenderness.  Cardiovascular:     Rate and Rhythm: Normal rate and regular rhythm.  Pulmonary:     Effort: Pulmonary effort is normal. No respiratory distress.     Breath sounds: Normal breath sounds. No wheezing or rhonchi.  Skin:    General: Skin is warm and dry.     Findings: No rash.  Neurological:     Mental Status: She is alert and oriented to person, place, and time. Mental status is at baseline.  Psychiatric:        Mood and Affect: Mood normal.        Behavior: Behavior normal.     Wt Readings from Last 3 Encounters:  02/01/23 160 lb (72.6 kg)  12/13/22 158 lb (71.7 kg)  10/11/22 159 lb (72.1 kg)    BP 118/78   Pulse 72   Ht 4\' 5"  (1.346 m)   Wt 160 lb (72.6 kg)   SpO2 99%   BMI 40.05 kg/m   Assessment and Plan:  Problem List Items Addressed This Visit       Unprioritized   OP (osteoporosis) (Chronic)   Relevant Medications   alendronate (FOSAMAX) 70 MG tablet   Hyperlipidemia associated with type 2 diabetes mellitus (HCC) (Chronic)   Relevant Medications   atorvastatin (LIPITOR) 10 MG tablet   lisinopril (ZESTRIL) 10 MG tablet   metFORMIN (GLUCOPHAGE) 500 MG tablet   Hemiparesis affecting left side as late effect of cerebrovascular  accident (CVA) (HCC) (Chronic)   Relevant Medications   baclofen (LIORESAL) 10 MG tablet   Other Visit Diagnoses     Need for influenza vaccination    -  Primary   Relevant Orders   Flu Vaccine Trivalent High Dose (Fluad)   Type II diabetes mellitus with complication (HCC)       Relevant Medications   atorvastatin (LIPITOR) 10 MG tablet   lisinopril (ZESTRIL) 10 MG tablet   metFORMIN (GLUCOPHAGE) 500 MG tablet   Accu-Chek Softclix Lancets lancets   glucose blood (ACCU-CHEK GUIDE) test strip   Essential hypertension       Relevant Medications   atorvastatin (LIPITOR) 10 MG tablet   lisinopril (ZESTRIL) 10 MG tablet       No follow-ups on file.    Reubin Milan, MD [facility] [place]

## 2023-03-18 ENCOUNTER — Ambulatory Visit
Admission: RE | Admit: 2023-03-18 | Discharge: 2023-03-18 | Disposition: A | Discharge status: Home / Self Care | Payer: 59 | Source: Ambulatory Visit | Attending: Internal Medicine | Admitting: Internal Medicine

## 2023-03-18 DIAGNOSIS — Z1231 Encounter for screening mammogram for malignant neoplasm of breast: Secondary | ICD-10-CM | POA: Insufficient documentation

## 2023-03-20 ENCOUNTER — Ambulatory Visit (INDEPENDENT_AMBULATORY_CARE_PROVIDER_SITE_OTHER): Payer: 59 | Admitting: Internal Medicine

## 2023-03-20 ENCOUNTER — Encounter: Payer: Self-pay | Admitting: Internal Medicine

## 2023-03-20 VITALS — BP 134/68 | HR 74 | Ht <= 58 in | Wt 158.6 lb

## 2023-03-20 DIAGNOSIS — B356 Tinea cruris: Secondary | ICD-10-CM

## 2023-03-20 DIAGNOSIS — B354 Tinea corporis: Secondary | ICD-10-CM | POA: Diagnosis not present

## 2023-03-20 MED ORDER — NYSTATIN-TRIAMCINOLONE 100000-0.1 UNIT/GM-% EX OINT
1.0000 | TOPICAL_OINTMENT | Freq: Two times a day (BID) | CUTANEOUS | 0 refills | Status: DC
Start: 2023-03-20 — End: 2023-03-22

## 2023-03-20 NOTE — Progress Notes (Signed)
Date:  03/20/2023   Name:  Sharon York   DOB:  1945/09/30   MRN:  [id number]   Chief Complaint: Rash (Left inner thigh rash, close to vagina. Itching. Not painful. Started 3 days ago. )  Rash This is a new problem. The current episode started yesterday. The affected locations include the groin. Pertinent negatives include no fatigue or shortness of breath.    Review of Systems  Constitutional:  Negative for chills and fatigue.  Respiratory:  Negative for chest tightness and shortness of breath.   Cardiovascular:  Negative for chest pain.  Skin:  Positive for rash.  Neurological:  Negative for dizziness and headaches.     Lab Results  Component Value Date   NA 142 12/13/2022   K 4.5 12/13/2022   CO2 27 12/13/2022   GLUCOSE 116 (H) 12/13/2022   BUN 11 12/13/2022   CREATININE 0.62 12/13/2022   CALCIUM 9.1 12/13/2022   EGFR 91 12/13/2022   GFRNONAA 79 12/03/2019   Lab Results  Component Value Date   CHOL 169 12/13/2022   HDL 70 12/13/2022   LDLCALC 83 12/13/2022   TRIG 90 12/13/2022   CHOLHDL 2.4 12/13/2022   Lab Results  Component Value Date   TSH 3.130 12/13/2022   Lab Results  Component Value Date   HGBA1C 7.5 (H) 12/13/2022   Lab Results  Component Value Date   WBC 5.0 12/13/2022   HGB 12.5 12/13/2022   HCT 38.4 12/13/2022   MCV 72 (L) 12/13/2022   PLT 271 12/13/2022   Lab Results  Component Value Date   ALT 16 12/13/2022   AST 17 12/13/2022   ALKPHOS 47 12/13/2022   BILITOT 0.3 12/13/2022   Lab Results  Component Value Date   VD25OH 123.2 (H) 12/11/2021     Patient Active Problem List   Diagnosis Date Noted   Trigger finger, right middle finger 12/11/2021   Microalbuminuria due to type 2 diabetes mellitus (HCC) 12/07/2019   Diabetes mellitus treated with oral medication (HCC) 04/21/2018   Right shoulder pain 03/31/2018   Hyperlipidemia associated with type 2 diabetes mellitus (HCC) 06/12/2017   Tinea corporis 03/17/2015   Abnormal  gait 12/15/2014   Altered bowel function 12/15/2014   Hemiparesis affecting left side as late effect of cerebrovascular accident (CVA) (HCC) 12/15/2014   Obstructive sleep apnea of adult 12/15/2014   Arthritis of shoulder region, 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     Past Surgical History:  Procedure Laterality Date   CESAREAN SECTION  1975   COLONOSCOPY  2010   normal    Social History   Tobacco Use   Smoking status: Never    Passive exposure: Never   Smokeless tobacco: Never   Tobacco comments:    smoking cessation materials not required  Vaping Use   Vaping status: Never Used  Substance Use Topics   Alcohol use: No    Alcohol/week: 0.0 standard drinks of alcohol   Drug use: Never     Medication list has been reviewed and updated.  Current Meds  Medication Sig   Accu-Chek Softclix Lancets lancets USE TO CHECK GLUCOSE UP TO 4 TIMES DAILY   alendronate (FOSAMAX) 70  MG tablet TAKE 1 TABLET BY MOUTH ONCE A WEEK WITH A FULL GLASS OF WATER ON AN EMPTY STOMACH   aspirin 81 MG chewable tablet Chew 1 tablet by mouth daily.   atorvastatin (LIPITOR) 10 MG tablet Take 1 tablet (10 mg total) by mouth daily.   baclofen (LIORESAL) 10 MG tablet Take 1 tablet (10 mg total) by mouth 2 (two) times daily.   blood glucose meter kit and supplies Dispense based on patient and insurance preference. Use up to four times daily as directed. (FOR ICD-10 E10.9, E11.9).   glucose blood (ACCU-CHEK GUIDE) test strip Use as instructed   lisinopril (ZESTRIL) 10 MG tablet Take 1 tablet (10 mg total) by mouth daily.   metFORMIN (GLUCOPHAGE) 500 MG tablet Take 1 tablet (500 mg total) by mouth daily with breakfast.    Multiple Vitamin (MULTI-VITAMIN) tablet Take 1 tablet by mouth daily.   nystatin cream (MYCOSTATIN) Apply 1 Application topically 2 (two) times daily.   [DISCONTINUED] nystatin-triamcinolone ointment (MYCOLOG) Apply 1 Application topically 2 (two) times daily.       03/20/2023   11:13 AM 02/01/2023    9:01 AM 12/13/2022    9:15 AM 10/11/2022   10:30 AM  GAD 7 : Generalized Anxiety Score  Nervous, Anxious, on Edge 0 0 0 0  Control/stop worrying 0 0 0 0  Worry too much - different things 0 0 0 0  Trouble relaxing 0 0 0 0  Restless 0 0 0 0  Easily annoyed or irritable 0 0 0 0  Afraid - awful might happen 0 0 0 0  Total GAD 7 Score 0 0 0 0  Anxiety Difficulty Not difficult at all Not difficult at all Not difficult at all Not difficult at all       03/20/2023   11:13 AM 02/01/2023    9:01 AM 12/13/2022    9:15 AM  Depression screen PHQ 2/9  Decreased Interest 0 0 0  Down, Depressed, Hopeless 0 0 0  PHQ - 2 Score 0 0 0  Altered sleeping 0 0 0  Tired, decreased energy 0 0 0  Change in appetite 0 0 0  Feeling bad or failure about yourself  0 0 0  Trouble concentrating 0 0 0  Moving slowly or fidgety/restless 0 0 0  Suicidal thoughts 0 0 0  PHQ-9 Score 0 0 0  Difficult doing work/chores Not difficult at all Not difficult at all Not difficult at all    BP Readings from Last 3 Encounters:  03/20/23 134/68  02/01/23 118/78  12/13/22 118/78    Physical Exam Vitals and nursing note reviewed.  Constitutional:      General: She is not in acute distress.    Appearance: She is well-developed.  HENT:     Head: Normocephalic and atraumatic.  Pulmonary:     Effort: Pulmonary effort is normal. No respiratory distress.  Skin:    General: Skin is warm and dry.     Findings: Erythema and rash present.          Comments: Tinea appearing rash in left groin  Neurological:     Mental Status: She is alert and oriented to person, place, and time.  Psychiatric:        Mood and Affect:  Mood normal.        Behavior: Behavior normal.     Wt Readings from Last 3 Encounters:  03/20/23 158 lb 9.6 oz (71.9 kg)  02/01/23 160 lb (72.6 kg)  12/13/22 158 lb (71.7 kg)    BP 134/68   Pulse 74   Ht 4\' 5"  (1.346 m)   Wt 158 lb 9.6 oz (71.9 kg)   SpO2 96%   BMI 39.70 kg/m   Assessment and Plan:  Problem List Items Addressed This Visit       Unprioritized   Tinea corporis   Relevant Medications   nystatin-triamcinolone ointment (MYCOLOG)   Other Visit Diagnoses     Tinea cruris    -  Primary   Relevant Medications   nystatin-triamcinolone ointment (MYCOLOG)       No follow-ups on file.    Reubin Milan, MD [facility] [place]

## 2023-03-21 ENCOUNTER — Telehealth: Payer: Self-pay | Admitting: Internal Medicine

## 2023-03-21 NOTE — Telephone Encounter (Signed)
Please review. Preferred medications in bin.  KP

## 2023-03-21 NOTE — Telephone Encounter (Signed)
The patient called stating the pharmacy [facility] told her that her insurance will not cover the nystatin-triamcinolone ointment (MYCOLOG) . She states she got it before but it was not through [facility] and wonders if that is the issue. Please assist patient further by either calling that script into the previous used pharmacy that covered it or an alternative to that ointment that [employer] will cover. Please assist patient further.

## 2023-03-22 ENCOUNTER — Other Ambulatory Visit: Payer: Self-pay | Admitting: Internal Medicine

## 2023-03-22 DIAGNOSIS — B356 Tinea cruris: Secondary | ICD-10-CM

## 2023-03-22 MED ORDER — CLOTRIMAZOLE-BETAMETHASONE 1-0.05 % EX CREA
1.0000 | TOPICAL_CREAM | Freq: Two times a day (BID) | CUTANEOUS | 0 refills | Status: AC
Start: 2023-03-22 — End: ?

## 2023-03-29 NOTE — Telephone Encounter (Signed)
Pt called in about getting, med, nystatin-triamcinolone ointment (MYCOLOG  approved, that insurance wont cover or get it at a discount. She states if Dr Judithann Arenson will stress to her insurance that she needs it that it may be approved.

## 2023-04-01 NOTE — Telephone Encounter (Signed)
Alternative medication was sent in for patient to take. Insurance will not cover the other. - Sharon York

## 2023-04-17 ENCOUNTER — Other Ambulatory Visit: Payer: Self-pay

## 2023-04-17 DIAGNOSIS — B3789 Other sites of candidiasis: Secondary | ICD-10-CM

## 2023-04-17 MED ORDER — NYSTATIN 100000 UNIT/GM EX CREA
1.0000 | TOPICAL_CREAM | Freq: Two times a day (BID) | CUTANEOUS | 1 refills | Status: AC
Start: 2023-04-17 — End: ?

## 2023-04-25 DIAGNOSIS — H903 Sensorineural hearing loss, bilateral: Secondary | ICD-10-CM | POA: Diagnosis not present

## 2023-04-25 DIAGNOSIS — H6121 Impacted cerumen, right ear: Secondary | ICD-10-CM | POA: Diagnosis not present

## 2023-05-03 ENCOUNTER — Telehealth: Payer: Self-pay | Admitting: Internal Medicine

## 2023-05-03 NOTE — Telephone Encounter (Signed)
Copied from CRM [id number]. Topic: Appointment Scheduling - Scheduling Inquiry for Clinic >> May 03, 2023  3:26 PM Marlow Baars wrote: Reason for CRM: The patient called in stating she is in need of a new wheelchair but does not want a Hoveround. She said she would talk with her provider about it at her appt in 10 days. She is going to call her insurance and local medical supply companies.

## 2023-05-13 ENCOUNTER — Ambulatory Visit (INDEPENDENT_AMBULATORY_CARE_PROVIDER_SITE_OTHER): Payer: 59 | Admitting: Internal Medicine

## 2023-05-13 ENCOUNTER — Encounter: Payer: Self-pay | Admitting: Internal Medicine

## 2023-05-13 VITALS — BP 128/78 | HR 80 | Ht <= 58 in | Wt 158.0 lb

## 2023-05-13 DIAGNOSIS — Z7984 Long term (current) use of oral hypoglycemic drugs: Secondary | ICD-10-CM

## 2023-05-13 DIAGNOSIS — E119 Type 2 diabetes mellitus without complications: Secondary | ICD-10-CM | POA: Diagnosis not present

## 2023-05-13 DIAGNOSIS — N3000 Acute cystitis without hematuria: Secondary | ICD-10-CM | POA: Diagnosis not present

## 2023-05-13 LAB — POCT GLYCOSYLATED HEMOGLOBIN (HGB A1C): Hemoglobin A1C: 6.8 % — AB (ref 4.0–5.6)

## 2023-05-13 MED ORDER — SULFAMETHOXAZOLE-TRIMETHOPRIM 800-160 MG PO TABS: 1.0000 | ORAL_TABLET | Freq: Two times a day (BID) | ORAL | 0 refills | Status: AC

## 2023-05-13 NOTE — Assessment & Plan Note (Addendum)
Blood sugars stable without hypoglycemic symptoms or events. Currently managed with metformin. Changes made last visit are none. Lab Results  Component Value Date   HGBA1C 7.5 (H) 12/13/2022  A1C today = 6.8

## 2023-05-13 NOTE — Progress Notes (Signed)
Date:  05/13/2023   Name:  Sharon York   DOB:  02/04/1946   MRN:  [id number]   Chief Complaint: Dysuria (Pain when urinating. )  Dysuria  This is a new problem. Episode onset: about three days ago. The problem has been unchanged. The quality of the pain is described as burning. The pain is mild. There has been no fever. Associated symptoms include frequency and urgency. Pertinent negatives include no chills, discharge, flank pain, hematuria, nausea or vomiting. She has tried increased fluids for the symptoms. The treatment provided no relief.  Diabetes She presents for her follow-up diabetic visit. She has type 2 diabetes mellitus. Her disease course has been stable. Pertinent negatives for hypoglycemia include no headaches or tremors. Pertinent negatives for diabetes include no chest pain, no fatigue, no polydipsia and no polyuria. Current diabetic treatment includes oral agent (monotherapy). She is compliant with treatment all of the time. Her breakfast blood glucose is taken between 6-7 am. Her breakfast blood glucose range is generally 130-140 mg/dl. An ACE inhibitor/angiotensin II receptor blocker is not being taken.    Review of Systems  Constitutional:  Negative for appetite change, chills, fatigue, fever and unexpected weight change.  HENT:  Negative for tinnitus and trouble swallowing.   Eyes:  Negative for visual disturbance.  Respiratory:  Negative for cough, chest tightness and shortness of breath.   Cardiovascular:  Negative for chest pain, palpitations and leg swelling.  Gastrointestinal:  Negative for abdominal pain, nausea and vomiting.  Endocrine: Negative for polydipsia and polyuria.  Genitourinary:  Positive for dysuria, frequency and urgency. Negative for flank pain and hematuria.  Musculoskeletal:  Negative for arthralgias.  Neurological:  Negative for tremors, numbness and headaches.  Psychiatric/Behavioral:  Negative for dysphoric mood.      Lab Results   Component Value Date   NA 142 12/13/2022   K 4.5 12/13/2022   CO2 27 12/13/2022   GLUCOSE 116 (H) 12/13/2022   BUN 11 12/13/2022   CREATININE 0.62 12/13/2022   CALCIUM 9.1 12/13/2022   EGFR 91 12/13/2022   GFRNONAA 79 12/03/2019   Lab Results  Component Value Date   CHOL 169 12/13/2022   HDL 70 12/13/2022   LDLCALC 83 12/13/2022   TRIG 90 12/13/2022   CHOLHDL 2.4 12/13/2022   Lab Results  Component Value Date   TSH 3.130 12/13/2022   Lab Results  Component Value Date   HGBA1C 6.8 (A) 05/13/2023   Lab Results  Component Value Date   WBC 5.0 12/13/2022   HGB 12.5 12/13/2022   HCT 38.4 12/13/2022   MCV 72 (L) 12/13/2022   PLT 271 12/13/2022   Lab Results  Component Value Date   ALT 16 12/13/2022   AST 17 12/13/2022   ALKPHOS 47 12/13/2022   BILITOT 0.3 12/13/2022   Lab Results  Component Value Date   VD25OH 123.2 (H) 12/11/2021     Patient Active Problem List   Diagnosis Date Noted   Trigger finger, right middle finger 12/11/2021   Microalbuminuria due to type 2 diabetes mellitus (HCC) 12/07/2019   Diabetes mellitus treated with oral medication (HCC) 04/21/2018   Right shoulder pain 03/31/2018   Hyperlipidemia associated with type 2 diabetes mellitus (HCC) 06/12/2017   Tinea corporis 03/17/2015   Abnormal gait 12/15/2014   Altered bowel function 12/15/2014   Hemiparesis affecting left side as late effect of cerebrovascular accident (CVA) (HCC) 12/15/2014   Obstructive sleep apnea of adult 12/15/2014   Arthritis of shoulder  region, 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     Past Surgical History:  Procedure Laterality Date   CESAREAN SECTION  1975   COLONOSCOPY  2010    normal    Social History   Tobacco Use   Smoking status: Never    Passive exposure: Never   Smokeless tobacco: Never   Tobacco comments:    smoking cessation materials not required  Vaping Use   Vaping status: Never Used  Substance Use Topics   Alcohol use: No    Alcohol/week: 0.0 standard drinks of alcohol   Drug use: Never     Medication list has been reviewed and updated.  Current Meds  Medication Sig   Accu-Chek Softclix Lancets lancets USE TO CHECK GLUCOSE UP TO 4 TIMES DAILY   alendronate (FOSAMAX) 70 MG tablet TAKE 1 TABLET BY MOUTH ONCE A WEEK WITH A FULL GLASS OF WATER ON AN EMPTY STOMACH   aspirin 81 MG chewable tablet Chew 1 tablet by mouth daily.   atorvastatin (LIPITOR) 10 MG tablet Take 1 tablet (10 mg total) by mouth daily.   baclofen (LIORESAL) 10 MG tablet Take 1 tablet (10 mg total) by mouth 2 (two) times daily.   blood glucose meter kit and supplies Dispense based on patient and insurance preference. Use up to four times daily as directed. (FOR ICD-10 E10.9, E11.9).   clotrimazole-betamethasone (LOTRISONE) cream Apply 1 Application topically 2 (two) times daily. To groin rash   glucose blood (ACCU-CHEK GUIDE) test strip Use as instructed   lisinopril (ZESTRIL) 10 MG tablet Take 1 tablet (10 mg total) by mouth daily.   metFORMIN (GLUCOPHAGE) 500 MG tablet Take 1 tablet (500 mg total) by mouth daily with breakfast.   Multiple Vitamin (MULTI-VITAMIN) tablet Take 1 tablet by mouth daily.   nystatin cream (MYCOSTATIN) Apply 1 Application topically 2 (two) times daily.   sulfamethoxazole-trimethoprim (BACTRIM DS) 800-160 MG tablet Take 1 tablet by mouth 2 (two) times daily for 5 days.       05/13/2023   10:02 AM 03/20/2023   11:13 AM 02/01/2023    9:01 AM 12/13/2022    9:15 AM  GAD 7 : Generalized Anxiety Score  Nervous, Anxious, on Edge 0 0 0 0  Control/stop worrying 0 0 0 0  Worry too much - different things 0 0 0 0  Trouble relaxing 0 0 0 0  Restless 0  0 0 0  Easily annoyed or irritable 0 0 0 0  Afraid - awful might happen 0 0 0 0  Total GAD 7 Score 0 0 0 0  Anxiety Difficulty Not difficult at all Not difficult at all Not difficult at all Not difficult at all       05/13/2023   10:02 AM 03/20/2023   11:13 AM 02/01/2023    9:01 AM  Depression screen PHQ 2/9  Decreased Interest 0 0 0  Down, Depressed, Hopeless 0 0 0  PHQ - 2 Score 0 0 0  Altered sleeping 0 0 0  Tired, decreased energy 0 0 0  Change in appetite 0 0 0  Feeling bad or failure about yourself  0 0 0  Trouble concentrating 0 0 0  Moving  slowly or fidgety/restless 0 0 0  Suicidal thoughts 0 0 0  PHQ-9 Score 0 0 0  Difficult doing work/chores Not difficult at all Not difficult at all Not difficult at all    BP Readings from Last 3 Encounters:  05/13/23 128/78  03/20/23 134/68  02/01/23 118/78    Physical Exam Vitals and nursing note reviewed.  Constitutional:      General: She is not in acute distress.    Appearance: She is well-developed.  HENT:     Head: Normocephalic and atraumatic.  Cardiovascular:     Rate and Rhythm: Normal rate and regular rhythm.  Pulmonary:     Effort: Pulmonary effort is normal. No respiratory distress.     Breath sounds: No wheezing or rhonchi.  Abdominal:     Palpations: Abdomen is soft.     Tenderness: There is no abdominal tenderness. There is no right CVA tenderness or left CVA tenderness.  Musculoskeletal:        General: No swelling.  Skin:    General: Skin is warm and dry.     Capillary Refill: Capillary refill takes less than 2 seconds.     Findings: No rash.  Neurological:     Mental Status: She is alert and oriented to person, place, and time.     Gait: Gait abnormal (in WC today).  Psychiatric:        Mood and Affect: Mood normal.        Behavior: Behavior normal.     Wt Readings from Last 3 Encounters:  05/13/23 158 lb (71.7 kg)  03/20/23 158 lb 9.6 oz (71.9 kg)  02/01/23 160 lb (72.6 kg)    BP 128/78    Pulse 80   Ht 4\' 5"  (1.346 m)   Wt 158 lb (71.7 kg)   SpO2 98%   BMI 39.55 kg/m   Assessment and Plan:  Problem List Items Addressed This Visit       Unprioritized   Diabetes mellitus treated with oral medication (HCC)   Blood sugars stable without hypoglycemic symptoms or events. Currently managed with metformin. Changes made last visit are none. Lab Results  Component Value Date   HGBA1C 7.5 (H) 12/13/2022  A1C today = 6.8       Relevant Orders   POCT glycosylated hemoglobin (Hb A1C) (Completed)   Other Visit Diagnoses       Acute cystitis without hematuria    -  Primary   unable to obtain UA   Relevant Medications   sulfamethoxazole-trimethoprim (BACTRIM DS) 800-160 MG tablet       No follow-ups on file.    Reubin Milan, MD [facility] [place]

## 2023-06-24 ENCOUNTER — Other Ambulatory Visit: Payer: Self-pay | Admitting: Internal Medicine

## 2023-06-24 DIAGNOSIS — E118 Type 2 diabetes mellitus with unspecified complications: Secondary | ICD-10-CM

## 2023-06-25 NOTE — Telephone Encounter (Signed)
 Requested Prescriptions  Pending Prescriptions Disp Refills   ACCU-CHEK GUIDE TEST test strip [Pharmacy Med Name: Accu-Chek Guide In Vitro Strip] 400 strip 2    Sig: USE TO CHECK BLOOD GLUCOSE AS  INTRUCTED     There is no refill protocol information for this order     Accu-Chek Softclix Lancets lancets [Pharmacy Med Name: Accu-Chek Softclix Lancets] 200 each 6    Sig: USE TO CHECK BLOOD GLUCOSE UP TO 4 TIMES DAILY     Endocrinology: Diabetes - Testing Supplies Passed - 06/25/2023  3:12 PM      Passed - Valid encounter within last 12 months    Recent Outpatient Visits           1 month ago Acute cystitis without hematuria   [facility] at [facility], Leita DEL, MD   3 months ago Tinea cruris   [facility], Leita DEL, MD   4 months ago Need for influenza vaccination   [facility] at [facility], Leita DEL, MD   6 months ago Annual physical exam   [facility] at [facility], Leita DEL, MD   8 months ago Tinea corporis   [facility] at [facility], Leita DEL, MD       Future Appointments             In 5 months Adie, Leita DEL, MD [facility] at [facility], [facility]

## 2023-07-22 DIAGNOSIS — H905 Unspecified sensorineural hearing loss: Secondary | ICD-10-CM | POA: Diagnosis not present

## 2023-08-06 ENCOUNTER — Encounter: Payer: Self-pay | Admitting: Internal Medicine

## 2023-08-06 ENCOUNTER — Telehealth: Payer: Self-pay | Admitting: Internal Medicine

## 2023-08-06 NOTE — Telephone Encounter (Signed)
 Spoke with patient's daughter, Inetta Fermo. She is requesting a letter to be written from PCP to obtain a walk in shower in her new home due to her mother being disabled and needing assistance. Mother will be living with her in the new home and wants the builder to accommodate her needs.    Spoke with Dr. Judithann Ghuman regarding letter. She agreed. Letter was had made.

## 2023-08-06 NOTE — Telephone Encounter (Signed)
 Copied from CRM [id number]. Topic: General - Other >> Aug 05, 2023  4:56 PM Fuller Mandril wrote: Reason for CRM: Patient daughter called. States patient may be living with her from time to time and they place where she is getting house built states provider may send in letter to authorize the downstairs bathroom to be remodeled. Would like to know if that is something provider can do. Call back: [phone] Thank You

## 2023-08-07 NOTE — Telephone Encounter (Signed)
 Spoke with daughter tina on 08/06/2023. Informed her that the letter was ready to be picked up and she could come by to pick it up. She verbalized understanding.

## 2023-08-08 ENCOUNTER — Ambulatory Visit: Payer: 59 | Admitting: Emergency Medicine

## 2023-08-08 VITALS — Ht <= 58 in | Wt 159.0 lb

## 2023-08-08 DIAGNOSIS — E119 Type 2 diabetes mellitus without complications: Secondary | ICD-10-CM

## 2023-08-08 DIAGNOSIS — Z0000 Encounter for general adult medical examination without abnormal findings: Secondary | ICD-10-CM

## 2023-08-08 NOTE — Progress Notes (Signed)
 Subjective:   Sharon York is a 78 y.o. who presents for a Medicare Wellness preventive visit.  Visit Complete: Virtual I connected with  JERMIAH HOWTON on 08/08/23 by a audio enabled telemedicine application and verified that I am speaking with the correct person using two identifiers.  Patient Location: Home  Provider Location: Home Office  I discussed the limitations of evaluation and management by telemedicine. The patient expressed understanding and agreed to proceed.  Vital Signs: Because this visit was a virtual/telehealth visit, some criteria may be missing or patient reported. Any vitals not documented were not able to be obtained and vitals that have been documented are patient reported.  VideoDeclined- This patient declined Librarian, academic. Therefore the visit was completed with audio only.  Persons Participating in Visit: Patient.  AWV Questionnaire: No: Patient Medicare AWV questionnaire was not completed prior to this visit.  Cardiac Risk Factors include: advanced age (>10men, >64 women);dyslipidemia;diabetes mellitus;obesity (BMI >30kg/m2);Other (see comment), Risk factor comments: history OSA (no cpap)     Objective:    Today's Vitals   08/08/23 1004  Weight: 159 lb (72.1 kg)  Height: 4\' 9"  (1.448 m)   Body mass index is 34.41 kg/m.     08/08/2023   10:21 AM 07/19/2022    3:08 PM 07/12/2021    3:37 PM 11/23/2020    8:38 AM 07/11/2020    3:28 PM 12/15/2019   11:09 AM 09/29/2019   11:31 AM  Advanced Directives  Does Patient Have a Medical Advance Directive? Yes Yes Yes No Yes No Yes  Type of Estate agent of [place];Living will Healthcare Power of [place];Living will Healthcare Power of [place];Living will  Healthcare Power of [place];Living will  Healthcare Power of [place];Living will  Does patient want to make changes to medical advance directive? No - Patient declined No - Patient declined        Copy of Healthcare Power of Attorney in Chart? Yes - validated most recent copy scanned in chart (See row information) Yes - validated most recent copy scanned in chart (See row information) Yes - validated most recent copy scanned in chart (See row information)  Yes - validated most recent copy scanned in chart (See row information)      Current Medications (verified) Outpatient Encounter Medications as of 08/08/2023  Medication Sig   ACCU-CHEK GUIDE TEST test strip USE TO CHECK BLOOD GLUCOSE AS  INTRUCTED   Accu-Chek Softclix Lancets lancets USE TO CHECK BLOOD GLUCOSE UP TO 4 TIMES DAILY   alendronate (FOSAMAX) 70 MG tablet TAKE 1 TABLET BY MOUTH ONCE A WEEK WITH A FULL GLASS OF WATER ON AN EMPTY STOMACH   aspirin 81 MG chewable tablet Chew 1 tablet by mouth daily.   atorvastatin (LIPITOR) 10 MG tablet Take 1 tablet (10 mg total) by mouth daily.   baclofen (LIORESAL) 10 MG tablet Take 1 tablet (10 mg total) by mouth 2 (two) times daily.   blood glucose meter kit and supplies Dispense based on patient and insurance preference. Use up to four times daily as directed. (FOR ICD-10 E10.9, E11.9).   lisinopril (ZESTRIL) 10 MG tablet Take 1 tablet (10 mg total) by mouth daily.   Melatonin 10 MG TABS Take 1 tablet by mouth at bedtime as needed.   metFORMIN (GLUCOPHAGE) 500 MG tablet Take 1 tablet (500 mg total) by mouth daily with breakfast.   Multiple Vitamin (MULTI-VITAMIN) tablet Take 1 tablet by mouth daily.   clotrimazole-betamethasone (LOTRISONE)  cream Apply 1 Application topically 2 (two) times daily. To groin rash (Patient not taking: Reported on 08/08/2023)   nystatin cream (MYCOSTATIN) Apply 1 Application topically 2 (two) times daily. (Patient not taking: Reported on 08/08/2023)   No facility-administered encounter medications on file as of 08/08/2023.    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   Past Surgical History:  Procedure Laterality Date   CESAREAN SECTION  1975   COLONOSCOPY  2010   normal   Family History  Problem Relation Age of Onset   Diabetes Mother    Thyroid disease Mother    Other Father 4       auto accident   Prostate cancer Brother    Kidney failure Brother    Kidney cancer Son    Breast cancer Maternal Aunt    Breast cancer Cousin        mat cousin   Bladder Cancer Neg Hx    Social History   Socioeconomic History   Marital status: Divorced    Spouse name: Not on file   Number of children: 6   Years of education: Not on file   Highest education level: 8th grade  Occupational History   Occupation: Retired  Tobacco Use   Smoking status: Never    Passive exposure: Never   Smokeless tobacco: Never   Tobacco comments:    smoking cessation materials not required  Vaping Use   Vaping status: Never Used  Substance and Sexual Activity   Alcohol use: No    Alcohol/week: 0.0 standard drinks of alcohol   Drug use: Never   Sexual activity: Not Currently  Other Topics Concern   Not on file  Social History Narrative   Pt lives with her daughter   5 living children and 1 deceased   Social Drivers of Corporate investment banker Strain: Low Risk  (08/08/2023)   Overall Financial Resource Strain (CARDIA)    Difficulty of Paying Living Expenses: Not hard at all  Food Insecurity: No Food Insecurity (08/08/2023)   Hunger Vital Sign    Worried About Running Out of Food in the Last Year: Never true    Ran Out of Food in the Last Year: Never true  Transportation Needs: No Transportation Needs (08/08/2023)   PRAPARE - Administrator, Civil Service (Medical): No     Lack of Transportation (Non-Medical): No  Physical Activity: Sufficiently Active (08/08/2023)   Exercise Vital Sign    Days of Exercise per Week: 7 days    Minutes of Exercise per Session: 30 min  Stress: No Stress Concern Present (08/08/2023)   [employer] of Occupational Health - Occupational Stress Questionnaire    Feeling of Stress : Not at all  Social Connections: Moderately Integrated (08/08/2023)   Social Connection and Isolation Panel [NHANES]    Frequency of Communication with Friends and Family: More than three times a week    Frequency of Social Gatherings with Friends and Family: Twice a week    Attends Religious Services: More than 4 times per year    Active Member of [employer] or Organizations:  Yes    Attends Banker Meetings: More than 4 times per year    Marital Status: Divorced    Tobacco Counseling Counseling given: Not Answered Tobacco comments: smoking cessation materials not required    Clinical Intake:  Pre-visit preparation completed: Yes  Pain : No/denies pain     BMI - recorded: 34.41 Nutritional Status: BMI > 30  Obese Nutritional Risks: None Diabetes: Yes CBG done?: No (FBS 132 per patient) Did pt. bring in CBG monitor from home?: No  Lab Results  Component Value Date   HGBA1C 6.8 (A) 05/13/2023   HGBA1C 7.5 (H) 12/13/2022   HGBA1C 6.4 (A) 05/04/2022     How often do you need to have someone help you when you read instructions, pamphlets, or other written materials from your doctor or pharmacy?: 1 - Never  Interpreter Needed?: No  Information entered by :: Tora Kindred, CMA   Activities of Daily Living     08/08/2023   10:09 AM  In your present state of health, do you have any difficulty performing the following activities:  Hearing? 1  Comment wears hearing aids  Vision? 0  Difficulty concentrating or making decisions? 0  Walking or climbing stairs? 1  Comment uses a cane and rollator  Dressing or bathing? 0   Doing errands, shopping? 0  Preparing Food and eating ? N  Using the Toilet? N  In the past six months, have you accidently leaked urine? Y  Comment pad and depends  Do you have problems with loss of bowel control? N  Managing your Medications? N  Managing your Finances? N  Housekeeping or managing your Housekeeping? N    Patient Care Team: Reubin Milan, MD as PCP - General (Internal Medicine) [facility] as Consulting Physician (Ophthalmology) Gwyneth Revels, DPM as Referring Physician (Podiatry)  Indicate any recent Medical Services you may have received from other than [facility] providers in the past year (date may be approximate).     Assessment:   This is a routine wellness examination for [place].  Hearing/Vision screen Hearing Screening - Comments:: Wears hearing aids Vision Screening - Comments:: Gets eye exams, [facility], [place]   Goals Addressed             This Visit's Progress    Patient Stated       Maintain current health       Depression Screen     08/08/2023   10:19 AM 05/13/2023   10:02 AM 03/20/2023   11:13 AM 02/01/2023    9:01 AM 12/13/2022    9:15 AM 10/11/2022   10:29 AM 07/31/2022    9:08 AM  PHQ 2/9 Scores  PHQ - 2 Score 0 0 0 0 0 0 0  PHQ- 9 Score 1 0 0 0 0 0 0    Fall Risk     08/08/2023   10:25 AM 05/13/2023   10:02 AM 03/20/2023   11:13 AM 12/13/2022    9:15 AM 10/11/2022   10:29 AM  Fall Risk   Falls in the past year? 0 0 0 0 0  Number falls in past yr: 0 0 0 0 0  Injury with Fall? 0 0 0 0 0  Risk for fall due to : Impaired balance/gait;Impaired mobility No Fall Risks No Fall Risks No Fall Risks Impaired balance/gait  Follow up Falls prevention discussed;Falls evaluation completed;Education provided Falls evaluation completed Falls evaluation completed Falls evaluation completed Falls evaluation completed  MEDICARE RISK AT HOME:  Medicare Risk at Home Any stairs in or around the home?: Yes If so, are there any  without handrails?: No Home free of loose throw rugs in walkways, pet beds, electrical cords, etc?: Yes Adequate lighting in your home to reduce risk of falls?: Yes Life alert?: Yes Use of a cane, walker or w/c?: Yes (cane and rollator) Grab bars in the bathroom?: No Shower chair or bench in shower?: Yes Elevated toilet seat or a handicapped toilet?: Yes  TIMED UP AND GO:  Was the test performed?  No  Cognitive Function: 6CIT completed        08/08/2023   10:28 AM 07/19/2022    3:22 PM 01/12/2019   11:25 AM 01/08/2018   10:41 AM 01/07/2017    9:23 AM  6CIT Screen  What Year? 0 points 0 points 0 points 0 points 0 points  What month? 0 points 0 points 0 points 0 points 0 points  What time? 0 points 0 points 0 points 0 points 0 points  Count back from 20 0 points 0 points 0 points 0 points 0 points  Months in reverse 0 points 0 points 0 points 0 points 0 points  Repeat phrase 0 points 0 points 2 points 0 points 0 points  Total Score 0 points 0 points 2 points 0 points 0 points    Immunizations Immunization History  Administered Date(s) Administered   Fluad Quad(high Dose 65+) 01/16/2019, 02/11/2020, 02/02/2021, 02/21/2022   Fluad Trivalent(High Dose 65+) 02/01/2023   Influenza, High Dose Seasonal PF 02/06/2017, 01/22/2018   Influenza,inj,Quad PF,6+ Mos 02/18/2015   PFIZER(Purple Top)SARS-COV-2 Vaccination 06/01/2019, 06/22/2019, 02/25/2020   PNEUMOCOCCAL CONJUGATE-20 12/13/2022   Pfizer(Comirnaty)Fall Seasonal Vaccine 12 years and older 03/16/2022   Pneumococcal Conjugate-13 06/22/2016   Pneumococcal Polysaccharide-23 06/11/2017   Tdap 07/10/2018   Zoster Recombinant(Shingrix) 01/30/2021, 04/05/2021    Screening Tests Health Maintenance  Topic Date Due   COVID-19 Vaccine (5 - 2024-25 season) 01/20/2023   OPHTHALMOLOGY EXAM  03/02/2023   FOOT EXAM  05/05/2023   HEMOGLOBIN A1C  11/11/2023   Diabetic kidney evaluation - eGFR measurement  12/13/2023   Diabetic kidney  evaluation - Urine ACR  12/13/2023   MAMMOGRAM  03/17/2024   Medicare Annual Wellness (AWV)  08/07/2024   DEXA SCAN  08/04/2025   DTaP/Tdap/Td (2 - Td or Tdap) 07/10/2028   Pneumonia Vaccine 32+ Years old  Completed   INFLUENZA VACCINE  Completed   Zoster Vaccines- Shingrix  Completed   HPV VACCINES  Aged Out   Colonoscopy  Discontinued   Hepatitis C Screening  Discontinued    Health Maintenance  Health Maintenance Due  Topic Date Due   COVID-19 Vaccine (5 - 2024-25 season) 01/20/2023   OPHTHALMOLOGY EXAM  03/02/2023   FOOT EXAM  05/05/2023   Health Maintenance Items Addressed: See Nurse Notes  Additional Screening:  Vision Screening: Recommended annual ophthalmology exams for early detection of glaucoma and other disorders of the eye.  Dental Screening: Recommended annual dental exams for proper oral hygiene  Community Resource Referral / Chronic Care Management: CRR required this visit?  No   CCM required this visit?  No     Plan:     I have personally reviewed and noted the following in the patient's chart:   Medical and social history Use of alcohol, tobacco or illicit drugs  Current medications and supplements including opioid prescriptions. Patient is not currently taking opioid prescriptions. Functional ability and status Nutritional status Physical  activity Advanced directives List of other physicians Hospitalizations, surgeries, and ER visits in previous 12 months Vitals Screenings to include cognitive, depression, and falls Referrals and appointments  In addition, I have reviewed and discussed with patient certain preventive protocols, quality metrics, and best practice recommendations. A written personalized care plan for preventive services as well as general preventive health recommendations were provided to patient.     Tora Kindred, CMA   08/08/2023   After Visit Summary: (Mail) Due to this being a telephonic visit, the after visit summary  with patients personalized plan was offered to patient via mail   Notes:  FBS 132 per patient Placed referral to DM & Nutrition Education per patient's request Needs DM eye exam (verified with Dr. Beverely Pace last eye exam was 03/01/22. Needs DM foot exam at next OV on 12/17/23 Plans to get Covid booster in the fall

## 2023-08-08 NOTE — Patient Instructions (Addendum)
 Sharon York , Thank you for taking time to come for your Medicare Wellness Visit. I appreciate your ongoing commitment to your health goals. Please review the following plan we discussed and let me know if I can assist you in the future.   Referrals/Orders/Follow-Ups/Clinician Recommendations:   You are due for a diabetic eye exam, please call [facility] @ [phone] to schedule an appointment at your earliest convenience. You are due for a diabetic foot exam. This can be done at your next office visit on 12/17/23. I have placed a referral to diabetic & nutrition education. Someone from that office will call you to schedule appointment. Get the Covid shot in the fall around the same time that you get the flu vaccine.  This is a list of the screening recommended for you and due dates:  Health Maintenance  Topic Date Due   COVID-19 Vaccine (5 - 2024-25 season) 01/20/2023   Eye exam for diabetics  03/02/2023   Complete foot exam   05/05/2023   Hemoglobin A1C  11/11/2023   Yearly kidney function blood test for diabetes  12/13/2023   Yearly kidney health urinalysis for diabetes  12/13/2023   Mammogram  03/17/2024   Medicare Annual Wellness Visit  08/07/2024   DEXA scan (bone density measurement)  08/04/2025   DTaP/Tdap/Td vaccine (2 - Td or Tdap) 07/10/2028   Pneumonia Vaccine  Completed   Flu Shot  Completed   Zoster (Shingles) Vaccine  Completed   HPV Vaccine  Aged Out   Colon Cancer Screening  Discontinued   Hepatitis C Screening  Discontinued    Advanced directives: (In Chart) A copy of your advanced directives are scanned into your chart should your provider ever need it.  Next Medicare Annual Wellness Visit scheduled for next year: Yes, 08/20/24 @ 10:00am (phone visit)  Fall Prevention in the Home, Adult Falls can cause injuries and affect people of all ages. There are many simple things that you can do to make your home safe and to help prevent falls. If you need it, ask for  help making these changes. What actions can I take to prevent falls? General information Use good lighting in all rooms. Make sure to: Replace any light bulbs that burn out. Turn on lights if it is dark and use night-lights. Keep items that you use often in easy-to-reach places. Lower the shelves around your home if needed. Move furniture so that there are clear paths around it. Do not keep throw rugs or other things on the floor that can make you trip. If any of your floors are uneven, fix them. Add color or contrast paint or tape to clearly mark and help you see: Grab bars or handrails. First and last steps of staircases. Where the edge of each step is. If you use a ladder or stepladder: Make sure that it is fully opened. Do not climb a closed ladder. Make sure the sides of the ladder are locked in place. Have someone hold the ladder while you use it. Know where your pets are as you move through your home. What can I do in the bathroom?     Keep the floor dry. Clean up any water that is on the floor right away. Remove soap buildup in the bathtub or shower. Buildup makes bathtubs and showers slippery. Use non-skid mats or decals on the floor of the bathtub or shower. Attach bath mats securely with double-sided, non-slip rug tape. If you need to sit down while you  are in the shower, use a non-slip stool. Install grab bars by the toilet and in the bathtub and shower. Do not use towel bars as grab bars. What can I do in the bedroom? Make sure that you have a light by your bed that is easy to reach. Do not use any sheets or blankets on your bed that hang to the floor. Have a firm bench or chair with side arms that you can use for support when you get dressed. What can I do in the kitchen? Clean up any spills right away. If you need to reach something above you, use a sturdy step stool that has a grab bar. Keep electrical cables out of the way. Do not use floor polish or wax that  makes floors slippery. What can I do with my stairs? Do not leave anything on the stairs. Make sure that you have a light switch at the top and the bottom of the stairs. Have them installed if you do not have them. Make sure that there are handrails on both sides of the stairs. Fix handrails that are broken or loose. Make sure that handrails are as long as the staircases. Install non-slip stair treads on all stairs in your home if they do not have carpet. Avoid having throw rugs at the top or bottom of stairs, or secure the rugs with carpet tape to prevent them from moving. Choose a carpet design that does not hide the edge of steps on the stairs. Make sure that carpet is firmly attached to the stairs. Fix any carpet that is loose or worn. What can I do on the outside of my home? Use bright outdoor lighting. Repair the edges of walkways and driveways and fix any cracks. Clear paths of anything that can make you trip, such as tools or rocks. Add color or contrast paint or tape to clearly mark and help you see high doorway thresholds. Trim any bushes or trees on the main path into your home. Check that handrails are securely fastened and in good repair. Both sides of all steps should have handrails. Install guardrails along the edges of any raised decks or porches. Have leaves, snow, and ice cleared regularly. Use sand, salt, or ice melt on walkways during winter months if you live where there is ice and snow. In the garage, clean up any spills right away, including grease or oil spills. What other actions can I take? Review your medicines with your health care provider. Some medicines can make you confused or feel dizzy. This can increase your chance of falling. Wear closed-toe shoes that fit well and support your feet. Wear shoes that have rubber soles and low heels. Use a cane, walker, scooter, or crutches that help you move around if needed. Talk with your provider about other ways that you  can decrease your risk of falls. This may include seeing a physical therapist to learn to do exercises to improve movement and strength. Where to find more information Centers for Disease Control and Prevention, STEADI: TonerPromos.no [employer] on Aging: BaseRingTones.pl National Institute on Aging: BaseRingTones.pl Contact a health care provider if: You are afraid of falling at home. You feel weak, drowsy, or dizzy at home. You fall at home. Get help right away if you: Lose consciousness or have trouble moving after a fall. Have a fall that causes a head injury. These symptoms may be an emergency. Get help right away. Call 911. Do not wait to see if the symptoms  will go away. Do not drive yourself to the hospital. This information is not intended to replace advice given to you by your health care provider. Make sure you discuss any questions you have with your health care provider. Document Revised: 01/08/2022 Document Reviewed: 01/08/2022 Elsevier Patient Education  2024 [employer].

## 2023-08-13 ENCOUNTER — Encounter: Payer: Self-pay | Admitting: Internal Medicine

## 2023-08-13 ENCOUNTER — Ambulatory Visit (INDEPENDENT_AMBULATORY_CARE_PROVIDER_SITE_OTHER): Admitting: Internal Medicine

## 2023-08-13 VITALS — BP 140/82 | HR 90 | Ht <= 58 in | Wt 161.4 lb

## 2023-08-13 DIAGNOSIS — E119 Type 2 diabetes mellitus without complications: Secondary | ICD-10-CM | POA: Diagnosis not present

## 2023-08-13 DIAGNOSIS — I69354 Hemiplegia and hemiparesis following cerebral infarction affecting left non-dominant side: Secondary | ICD-10-CM | POA: Diagnosis not present

## 2023-08-13 DIAGNOSIS — Z23 Encounter for immunization: Secondary | ICD-10-CM | POA: Diagnosis not present

## 2023-08-13 DIAGNOSIS — Z7984 Long term (current) use of oral hypoglycemic drugs: Secondary | ICD-10-CM

## 2023-08-13 MED ORDER — DEXCOM G7 SENSOR MISC: 1.0000 | 5 refills | Status: AC

## 2023-08-13 NOTE — Assessment & Plan Note (Addendum)
 She wants to replace her scooter with the "Journey solite scooter" She also wants PT referral to help with balance and general strengthening. She will contact the scooter company to send me the forms for Medicare coverage

## 2023-08-13 NOTE — Assessment & Plan Note (Signed)
 Blood sugars have been stable.  No recent hypoglycemic events requiring assistance. Currently medications are MTF. Lab Results  Component Value Date   HGBA1C 6.8 (A) 05/13/2023   Last visit non changes were made. She wants to get the [facility] - she has [facility] so should be covered.

## 2023-08-13 NOTE — Patient Instructions (Signed)
 Get the over the counter version of Voltaren gel to use as needed.

## 2023-08-13 NOTE — Progress Notes (Signed)
 Date:  08/13/2023   Name:  Sharon York   DOB:  10-Jul-1945   MRN:  [id number]   Chief Complaint: Referral (Patient would like referral to PT)  Diabetes She presents for her follow-up diabetic visit. She has type 2 diabetes mellitus. Her disease course has been stable. Pertinent negatives for hypoglycemia include no dizziness, headaches or nervousness/anxiousness. Associated symptoms include weakness. Pertinent negatives for diabetes include no chest pain.    Review of Systems  Constitutional:  Negative for diaphoresis and fever.  Respiratory:  Negative for chest tightness and shortness of breath.   Cardiovascular:  Negative for chest pain and palpitations.  Musculoskeletal:  Positive for arthralgias and gait problem.  Neurological:  Positive for weakness. Negative for dizziness and headaches.  Psychiatric/Behavioral:  Negative for dysphoric mood and sleep disturbance. The patient is not nervous/anxious.      Lab Results  Component Value Date   NA 142 12/13/2022   K 4.5 12/13/2022   CO2 27 12/13/2022   GLUCOSE 116 (H) 12/13/2022   BUN 11 12/13/2022   CREATININE 0.62 12/13/2022   CALCIUM 9.1 12/13/2022   EGFR 91 12/13/2022   GFRNONAA 79 12/03/2019   Lab Results  Component Value Date   CHOL 169 12/13/2022   HDL 70 12/13/2022   LDLCALC 83 12/13/2022   TRIG 90 12/13/2022   CHOLHDL 2.4 12/13/2022   Lab Results  Component Value Date   TSH 3.130 12/13/2022   Lab Results  Component Value Date   HGBA1C 6.8 (A) 05/13/2023   Lab Results  Component Value Date   WBC 5.0 12/13/2022   HGB 12.5 12/13/2022   HCT 38.4 12/13/2022   MCV 72 (L) 12/13/2022   PLT 271 12/13/2022   Lab Results  Component Value Date   ALT 16 12/13/2022   AST 17 12/13/2022   ALKPHOS 47 12/13/2022   BILITOT 0.3 12/13/2022   Lab Results  Component Value Date   VD25OH 123.2 (H) 12/11/2021     Patient Active Problem List   Diagnosis Date Noted   Trigger finger, right middle finger  12/11/2021   Microalbuminuria due to type 2 diabetes mellitus (HCC) 12/07/2019   Diabetes mellitus treated with oral medication (HCC) 04/21/2018   Right shoulder pain 03/31/2018   Hyperlipidemia associated with type 2 diabetes mellitus (HCC) 06/12/2017   Tinea corporis 03/17/2015   Abnormal gait 12/15/2014   Altered bowel function 12/15/2014   Hemiparesis affecting left side as late effect of cerebrovascular accident (CVA) (HCC) 12/15/2014   Obstructive sleep apnea of adult 12/15/2014   Arthritis of shoulder region, 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     Past Surgical History:  Procedure Laterality Date   CESAREAN SECTION  1975   COLONOSCOPY  2010   normal    Social History   Tobacco Use   Smoking status: Never    Passive exposure: Never   Smokeless tobacco: Never   Tobacco comments:    smoking cessation materials not required  Vaping Use   Vaping status: Never Used  Substance Use Topics   Alcohol use: No    Alcohol/week: 0.0 standard drinks of alcohol   Drug use: Never     Medication list  has been reviewed and updated.  Current Meds  Medication Sig   ACCU-CHEK GUIDE TEST test strip USE TO CHECK BLOOD GLUCOSE AS  INTRUCTED   Accu-Chek Softclix Lancets lancets USE TO CHECK BLOOD GLUCOSE UP TO 4 TIMES DAILY   alendronate (FOSAMAX) 70 MG tablet TAKE 1 TABLET BY MOUTH ONCE A WEEK WITH A FULL GLASS OF WATER ON AN EMPTY STOMACH   aspirin 81 MG chewable tablet Chew 1 tablet by mouth daily.   atorvastatin (LIPITOR) 10 MG tablet Take 1 tablet (10 mg total) by mouth daily.   baclofen (LIORESAL) 10 MG tablet Take 1 tablet (10 mg total) by mouth 2 (two) times daily.   blood glucose meter kit  and supplies Dispense based on patient and insurance preference. Use up to four times daily as directed. (FOR ICD-10 E10.9, E11.9).   Continuous Glucose Sensor (DEXCOM G7 SENSOR) MISC 1 each by Does not apply route continuous.   lisinopril (ZESTRIL) 10 MG tablet Take 1 tablet (10 mg total) by mouth daily.   Melatonin 10 MG TABS Take 1 tablet by mouth at bedtime as needed.   metFORMIN (GLUCOPHAGE) 500 MG tablet Take 1 tablet (500 mg total) by mouth daily with breakfast.   Multiple Vitamin (MULTI-VITAMIN) tablet Take 1 tablet by mouth daily.       08/13/2023    3:41 PM 05/13/2023   10:02 AM 03/20/2023   11:13 AM 02/01/2023    9:01 AM  GAD 7 : Generalized Anxiety Score  Nervous, Anxious, on Edge 0 0 0 0  Control/stop worrying 0 0 0 0  Worry too much - different things 0 0 0 0  Trouble relaxing 0 0 0 0  Restless 0 0 0 0  Easily annoyed or irritable 0 0 0 0  Afraid - awful might happen 0 0 0 0  Total GAD 7 Score 0 0 0 0  Anxiety Difficulty Not difficult at all Not difficult at all Not difficult at all Not difficult at all       08/13/2023    3:41 PM 08/08/2023   10:19 AM 05/13/2023   10:02 AM  Depression screen PHQ 2/9  Decreased Interest 0 0 0  Down, Depressed, Hopeless 0 0 0  PHQ - 2 Score 0 0 0  Altered sleeping 0 0 0  Tired, decreased energy 1 1 0  Change in appetite 0 0 0  Feeling bad or failure about yourself  0 0 0  Trouble concentrating 0 0 0  Moving slowly or fidgety/restless 0 0 0  Suicidal thoughts 0 0 0  PHQ-9 Score 1 1 0  Difficult doing work/chores Not difficult at all Not difficult at all Not difficult at all    BP Readings from Last 3 Encounters:  08/13/23 (!) 140/82  05/13/23 128/78  03/20/23 134/68    Physical Exam Vitals and nursing note reviewed.  Constitutional:      General: She is not in acute distress.    Appearance: Normal appearance. She is well-developed.  HENT:     Head: Normocephalic and atraumatic.  Cardiovascular:     Rate and Rhythm:  Normal rate and regular rhythm.  Pulmonary:     Effort: Pulmonary effort is normal. No respiratory distress.     Breath sounds: No wheezing or rhonchi.  Musculoskeletal:     Cervical back: Normal range of motion.  Skin:    General: Skin is warm and dry.     Findings: No rash.  Neurological:  Mental Status: She is alert and oriented to person, place, and time. Mental status is at baseline.     Motor: Weakness (left hemiparesis) present.     Gait: Gait abnormal.  Psychiatric:        Mood and Affect: Mood normal.        Behavior: Behavior normal.     Wt Readings from Last 3 Encounters:  08/13/23 161 lb 6 oz (73.2 kg)  08/08/23 159 lb (72.1 kg)  05/13/23 158 lb (71.7 kg)    BP (!) 140/82   Pulse 90   Ht 4\' 9"  (1.448 m)   Wt 161 lb 6 oz (73.2 kg)   SpO2 96%   BMI 34.92 kg/m   Assessment and Plan:  Problem List Items Addressed This Visit       Unprioritized   Hemiparesis affecting left side as late effect of cerebrovascular accident (CVA) (HCC) (Chronic)   She wants to replace her scooter with the "Journey solite scooter" She also wants PT referral to help with balance and general strengthening. She will contact the scooter company to send me the forms for Medicare coverage      Relevant Orders   Ambulatory referral to Physical Therapy   Diabetes mellitus treated with oral medication (HCC) - Primary   Blood sugars have been stable.  No recent hypoglycemic events requiring assistance. Currently medications are MTF. Lab Results  Component Value Date   HGBA1C 6.8 (A) 05/13/2023   Last visit non changes were made. She wants to get the [facility] - she has [facility] so should be covered.       Relevant Medications   Continuous Glucose Sensor (DEXCOM G7 SENSOR) MISC   Other Visit Diagnoses       Long term current use of oral hypoglycemic drug         Immunization due       Relevant Orders   Pfizer Comirnaty Covid-19 Vaccine 29yrs & older (Completed)       No  follow-ups on file.    Reubin Milan, MD [facility] [place]

## 2023-08-14 ENCOUNTER — Telehealth: Payer: Self-pay

## 2023-08-14 NOTE — Telephone Encounter (Signed)
 PA for Dexcom G7 Senor completed for quantity (3) each for 30 days supply on [email or web address]  (Key: [id number]) PA Case ID #: [id number] Rx #: [id number]  Awaiting outcome.

## 2023-08-16 ENCOUNTER — Telehealth: Payer: Self-pay

## 2023-08-16 NOTE — Telephone Encounter (Signed)
 Copied from CRM [id number]. Topic: General - Other >> Aug 15, 2023 10:03 AM Almira Coaster wrote: Reason for CRM: Patient and [employer] called on a conference call to advise that the [employer] Chair that was ordered for the patient requires a prior authorization. Please advise patient once the PA has been submitted. >> Aug 15, 2023  4:44 PM Fredrica W wrote: Patient states this is a scooter and insurance confirmed it would be approved with authorization

## 2023-08-19 ENCOUNTER — Other Ambulatory Visit: Payer: Self-pay | Admitting: Internal Medicine

## 2023-08-19 ENCOUNTER — Telehealth: Payer: Self-pay

## 2023-08-19 DIAGNOSIS — Z961 Presence of intraocular lens: Secondary | ICD-10-CM | POA: Diagnosis not present

## 2023-08-19 DIAGNOSIS — E119 Type 2 diabetes mellitus without complications: Secondary | ICD-10-CM | POA: Diagnosis not present

## 2023-08-19 LAB — HM DIABETES EYE EXAM

## 2023-08-19 NOTE — Telephone Encounter (Signed)
 Copied from CRM [id number]. Topic: Referral - Prior Authorization Question >> Aug 19, 2023 11:21 AM DeAngela L wrote: Reason for CRM: [employer] calling prioir auth need for the [facility] prescription please call back at [phone] if this response is not received by August 28 2023 at 9:15 am this case might be denied case # [id number]

## 2023-08-19 NOTE — Telephone Encounter (Signed)
 PA completed on 08/14/2023. Fax was sent on 08/19/2023 to our office for additional information needed, primary care provider decided to placed a referral for care management to assist with the medical approval for Dexcom G7 senor. Awaiting outcome.

## 2023-08-19 NOTE — Telephone Encounter (Signed)
 I spoke with patient again. She was confused when I asked her how did she get the form? She then stated she will call back with the information, but she did not send PA Form.

## 2023-08-19 NOTE — Telephone Encounter (Signed)
 Spoke with patient and informed her that insurance does not cover journey wheel chair. She said the man told her they may reimburse for it once she pays for it. She also asked about her glucose monitor kit. I let her know that she can pick that up at the pharmacy.   JM

## 2023-08-19 NOTE — Progress Notes (Unsigned)
 re   Date:  08/19/2023   Name:  Sharon York   DOB:  February 27, 1946   MRN:  [id number]   Chief Complaint: No chief complaint on file.  HPI  Review of Systems   Lab Results  Component Value Date   NA 142 12/13/2022   K 4.5 12/13/2022   CO2 27 12/13/2022   GLUCOSE 116 (H) 12/13/2022   BUN 11 12/13/2022   CREATININE 0.62 12/13/2022   CALCIUM 9.1 12/13/2022   EGFR 91 12/13/2022   GFRNONAA 79 12/03/2019   Lab Results  Component Value Date   CHOL 169 12/13/2022   HDL 70 12/13/2022   LDLCALC 83 12/13/2022   TRIG 90 12/13/2022   CHOLHDL 2.4 12/13/2022   Lab Results  Component Value Date   TSH 3.130 12/13/2022   Lab Results  Component Value Date   HGBA1C 6.8 (A) 05/13/2023   Lab Results  Component Value Date   WBC 5.0 12/13/2022   HGB 12.5 12/13/2022   HCT 38.4 12/13/2022   MCV 72 (L) 12/13/2022   PLT 271 12/13/2022   Lab Results  Component Value Date   ALT 16 12/13/2022   AST 17 12/13/2022   ALKPHOS 47 12/13/2022   BILITOT 0.3 12/13/2022   Lab Results  Component Value Date   VD25OH 123.2 (H) 12/11/2021     Patient Active Problem List   Diagnosis Date Noted   Trigger finger, right middle finger 12/11/2021   Microalbuminuria due to type 2 diabetes mellitus (HCC) 12/07/2019   Diabetes mellitus treated with oral medication (HCC) 04/21/2018   Right shoulder pain 03/31/2018   Hyperlipidemia associated with type 2 diabetes mellitus (HCC) 06/12/2017   Tinea corporis 03/17/2015   Abnormal gait 12/15/2014   Altered bowel function 12/15/2014   Hemiparesis affecting left side as late effect of cerebrovascular accident (CVA) (HCC) 12/15/2014   Obstructive sleep apnea of adult 12/15/2014   Arthritis of shoulder region, degenerative 12/15/2014   OP (osteoporosis) 12/15/2014   Allergic rhinitis, seasonal 12/15/2014   Mixed incontinence 03/11/2013    Allergies  Allergen Reactions   Nitrofurantoin Nausea Only   Iodinated Contrast Media Hives and Cough    Patient  developed a hive on her left lip after injection as well as some coughing.     Past Surgical History:  Procedure Laterality Date   CESAREAN SECTION  1975   COLONOSCOPY  2010   normal    Social History   Tobacco Use   Smoking status: Never    Passive exposure: Never   Smokeless tobacco: Never   Tobacco comments:    smoking cessation materials not required  Vaping Use   Vaping status: Never Used  Substance Use Topics   Alcohol use: No    Alcohol/week: 0.0 standard drinks of alcohol   Drug use: Never     Medication list has been reviewed and updated.  No outpatient medications have been marked as taking for the 08/19/23 encounter (Orders Only) with Reubin Milan, MD.       08/13/2023    3:41 PM 05/13/2023   10:02 AM 03/20/2023   11:13 AM 02/01/2023    9:01 AM  GAD 7 : Generalized Anxiety Score  Nervous, Anxious, on Edge 0 0 0 0  Control/stop worrying 0 0 0 0  Worry too much - different things 0 0 0 0  Trouble relaxing 0 0 0 0  Restless 0 0 0 0  Easily annoyed or irritable 0 0 0 0  Afraid -  awful might happen 0 0 0 0  Total GAD 7 Score 0 0 0 0  Anxiety Difficulty Not difficult at all Not difficult at all Not difficult at all Not difficult at all       08/13/2023    3:41 PM 08/08/2023   10:19 AM 05/13/2023   10:02 AM  Depression screen PHQ 2/9  Decreased Interest 0 0 0  Down, Depressed, Hopeless 0 0 0  PHQ - 2 Score 0 0 0  Altered sleeping 0 0 0  Tired, decreased energy 1 1 0  Change in appetite 0 0 0  Feeling bad or failure about yourself  0 0 0  Trouble concentrating 0 0 0  Moving slowly or fidgety/restless 0 0 0  Suicidal thoughts 0 0 0  PHQ-9 Score 1 1 0  Difficult doing work/chores Not difficult at all Not difficult at all Not difficult at all    BP Readings from Last 3 Encounters:  08/13/23 (!) 140/82  05/13/23 128/78  03/20/23 134/68    Physical Exam  Wt Readings from Last 3 Encounters:  08/13/23 161 lb 6 oz (73.2 kg)  08/08/23 159 lb  (72.1 kg)  05/13/23 158 lb (71.7 kg)    There were no vitals taken for this visit.  Assessment and Plan:  Problem List Items Addressed This Visit   None   No follow-ups on file.    Reubin Milan, MD [facility] [place]

## 2023-08-21 ENCOUNTER — Telehealth: Payer: Self-pay

## 2023-08-21 NOTE — Progress Notes (Signed)
 Care Guide Pharmacy Note  08/21/2023 Name: Sharon York MRN: [id number] DOB: 1946/03/08  Referred By: Reubin Milan, MD Reason for referral: Care Coordination (Outreach to schedule with Pharm d )   Sharon York is a 78 y.o. year old female who is a primary care patient of Reubin Milan, MD.  De Burrs was referred to the pharmacist for assistance related to: DMII  Successful contact was made with the patient to discuss pharmacy services including being ready for the pharmacist to call at least 5 minutes before the scheduled appointment time and to have medication bottles and any blood pressure readings ready for review. The patient agreed to meet with the pharmacist via telephone visit on (date/time).09/02/2023  Penne Lash , RMA     [place]  [facility], [facility] Guide  Direct Dial: [phone]  Website: [email or web address]

## 2023-08-22 ENCOUNTER — Encounter: Payer: Self-pay | Admitting: Internal Medicine

## 2023-08-23 ENCOUNTER — Other Ambulatory Visit: Payer: Self-pay | Admitting: Pharmacist

## 2023-08-23 DIAGNOSIS — E119 Type 2 diabetes mellitus without complications: Secondary | ICD-10-CM

## 2023-08-23 NOTE — Progress Notes (Addendum)
   08/23/2023  Patient ID: De Burrs, female   DOB: 08-16-1945, 78 y.o.   MRN: [id number]  Receive referral message from PCP requesting investigation into whether patient eligible to receive continuous glucose monitor, such as Dexcom, through her [employer] plan. Note for patients with Medicare and Medicaid, use of CGM is only covered for patients who are on insulin and/or who have a history of problematic low blood glucose. From review of chart, does not appear that patient meets these requirements.  Follow up with patient today to provide this update. Also provide patient with information on cost of CGM outside of insurance.  Patient states will continue to monitor home blood sugar using glucometer - Reviewed goal A1c, goal fasting, and goal 2 hour post prandial glucose  Collaborate with PCP. Advise patient okay per provider to reduce blood sugar testing to every other day to reduce testing burden.  Denies further medication questions or concerns at this time  Will provide update to PCP  Estelle Grumbles, PharmD, [facility] [phone]

## 2023-08-23 NOTE — Patient Instructions (Signed)
 Goals Addressed             This Visit's Progress    Pharmacy Goals       The goal A1c is less than 7%. This is the best way to reduce the risk of the long term complications of diabetes, including heart disease, kidney disease, eye disease, strokes, and nerve damage. An A1c of less than 7% corresponds with fasting sugars less than 130 and 2 hour after meal sugars less than 180.   Estelle Grumbles, PharmD, [facility] [phone]

## 2023-09-02 ENCOUNTER — Other Ambulatory Visit: Admitting: Pharmacist

## 2023-09-03 ENCOUNTER — Ambulatory Visit: Attending: Physical Therapy | Admitting: Physical Therapy

## 2023-09-03 ENCOUNTER — Telehealth: Payer: Self-pay

## 2023-09-03 NOTE — Therapy (Deleted)
 OUTPATIENT PHYSICAL THERAPY BALANCE EVALUATION   Patient Name: Sharon York MRN: [id number] DOB:[birth year], 78 y.o., female Today's Date: 09/03/2023    Past Medical History:  Diagnosis Date   Absence of bladder continence 12/15/2014   Blood pressure elevated without history of HTN 12/15/2014   Diabetes mellitus without complication (HCC)    Hyperlipidemia    Osteoporosis    Overactive bladder    PMB (postmenopausal bleeding) 11/01/2017   Stroke (HCC)    hemiplegia on left side   Past Surgical History:  Procedure Laterality Date   CESAREAN SECTION  1975   COLONOSCOPY  2010   normal   Patient Active Problem List   Diagnosis Date Noted   Trigger finger, right middle finger 12/11/2021   Microalbuminuria due to type 2 diabetes mellitus (HCC) 12/07/2019   Diabetes mellitus treated with oral medication (HCC) 04/21/2018   Right shoulder pain 03/31/2018   Hyperlipidemia associated with type 2 diabetes mellitus (HCC) 06/12/2017   Tinea corporis 03/17/2015   Abnormal gait 12/15/2014   Altered bowel function 12/15/2014   Hemiparesis affecting left side as late effect of cerebrovascular accident (CVA) (HCC) 12/15/2014   Obstructive sleep apnea of adult 12/15/2014   Arthritis of shoulder region, degenerative 12/15/2014   OP (osteoporosis) 12/15/2014   Allergic rhinitis, seasonal 12/15/2014   Mixed incontinence 03/11/2013    PCP: Reubin Milan, MD  REFERRING PROVIDER: Reubin Milan, MD  REFERRING DIAGNOSIS: [phone] (ICD-10-CM) - Hemiparesis affecting left side as late effect of cerebrovascular accident (CVA) (HCC)   THERAPY DIAG: No diagnosis found.  ONSET DATE: ***  FOLLOW UP APPT WITH PROVIDER: {yes/no:20286}    RATIONALE FOR EVALUATION AND TREATMENT: Rehabilitation  SUBJECTIVE:                                                                                                                                                                                          Chief Complaint: Pt is an 78 year old female with Hx of CVA in 1995 with L hemiparesis referred for safety, balance, and general strengthening.   Pertinent History Pt is an 78 year old female with Hx of CVA in 1995 with L hemiparesis referred for safety, balance, and general strengthening.   Patient reports ***   Pain: {yes/no:20286} Numbness/Tingling: {yes/no:20286} Focal Weakness: {yes/no:20286} Recent changes in overall health/medication: {yes/no:20286} Prior history of physical therapy for balance:  {yes/no:20286} Falls: Has patient fallen in last 6 months? {yes/no:20286}, Number of falls: *** Directional pattern for falls: {yes/no:20286} Dominant hand: {RIGHT/LEFT:20294} Imaging: {yes/no:20286}  Prior level of function: {PLOF:24004} Occupational demands: Hobbies: Red flags (bowel/bladder changes, saddle paresthesia, personal history of cancer, h/o spinal tumors, h/o compression fx,  h/o abdominal aneurysm, abdominal pain, chills/fever, night sweats, nausea, vomiting, unrelenting pain): Negative  Precautions: None  Weight Bearing Restrictions: No  Living Environment Lives with: {[facility] lives with:25569::"lives with their family"} Lives in: {Lives in:25570} Has following equipment at home: {Assistive devices:23999}   Patient Goals: ***    OBJECTIVE:   Patient Surveys  ABC:  DHI: /100   Cognition Patient is oriented to person, place, and time.  Recent memory is intact.  Remote memory is intact.  Attention span and concentration are intact.  Expressive speech is intact.  Patient's fund of knowledge is within normal limits for educational level.     Gross Musculoskeletal Assessment Tremor: None Bulk: Normal Tone: Normal   GAIT: Distance walked: *** Assistive device utilized: {Assistive devices:23999} Level of assistance: {Levels of assistance:24026} Comments: ***   Posture: No gross abnormalities noted in standing or seated  posture    AROM  AROM (Normal range in degrees) AROM  09/03/2023  Lumbar   Flexion (65)   Extension (30)   Right lateral flexion (25)   Left lateral flexion (25)   Right rotation (30)   Left rotation (30)       Hip Right Left  Flexion (125)    Extension (15)    Abduction (40)    Adduction     Internal Rotation (45)    External Rotation (45)        Knee    Flexion (135)    Extension (0)        Ankle    Dorsiflexion (20)    Plantarflexion (50)    Inversion (35)    Eversion (15    (* = pain; Blank rows = not tested)   LE MMT:  MMT (out of 5) Right 09/03/2023 Left 09/03/2023  Hip flexion    Hip extension    Hip abduction    Hip adduction    Hip internal rotation    Hip external rotation    Knee flexion    Knee extension    Ankle dorsiflexion    Ankle plantarflexion    Ankle inversion    Ankle eversion    (* = pain; Blank rows = not tested)   Sensation Grossly intact to light touch bilateral LEs as determined by testing dermatomes L2-S2. Proprioception, and hot/cold testing deferred on this date.   Reflexes R/L Knee Jerk (L3/4): 2+/2+  Ankle Jerk (S1/2): 2+/2+    Cranial Nerves Visual acuity and visual fields are intact  Extraocular muscles are intact  Facial sensation is intact bilaterally  Facial strength is intact bilaterally  Hearing is normal as tested by gross conversation Palate elevates midline, normal phonation  Shoulder shrug strength is intact  Tongue protrudes midline   Coordination/Cerebellar Finger to Nose: WNL Heel to Shin: WNL Rapid alternating movements: WNL Finger Opposition: WNL Pronator Drift: Negative   FUNCTIONAL OUTCOME MEASURES   Results Comments  BERG /56 Fall risk, in need of intervention  DGI /24   FGA /30   TUG seconds   5TSTS seconds   6 Minute Walk Test    10 Meter Gait Speed Self-selected: s = m/s; Fastest: s = m/s Below normative values for full community ambulation  (Blank rows = not  tested)    TODAY'S TREATMENT  ***   PATIENT EDUCATION:  Education details: *** Person educated: {Person educated:25204} Education method: {Education Method:25205} Education comprehension: {Education Comprehension:25206}   HOME EXERCISE PROGRAM: ***   ASSESSMENT:  CLINICAL IMPRESSION: Patient is a *** y.o. ***  who was seen today for physical therapy evaluation and treatment for back pain. Objective impairments include {opptimpairments:25111}. These impairments are limiting patient from {activity limitations:25113}. Personal factors including {Personal factors:25162} are also affecting patient's functional outcome. Patient will benefit from skilled PT to address above impairments and improve overall function.  REHAB POTENTIAL: {rehabpotential:25112}  CLINICAL DECISION MAKING: {clinical decision making:25114}  EVALUATION COMPLEXITY: {Evaluation complexity:25115}   GOALS: Goals reviewed with patient? {yes/no:20286}  SHORT TERM GOALS: Target date: {follow up:25551}  Pt will be independent with HEP in order to improve strength and balance in order to decrease fall risk and improve function at home. Baseline: *** Goal status: INITIAL   LONG TERM GOALS: Target date: {follow up:25551}  Pt will increase FOTO to at least *** to demonstrate significant improvement in function at home related to balance  Baseline:  Goal status: INITIAL  2.  Pt will improve BERG by at least 3 points in order to demonstrate clinically significant improvement in balance.   Baseline: *** Goal status: INITIAL  3.  Pt will improve ABC by at least 13% in order to demonstrate clinically significant improvement in balance confidence.      Baseline: *** Goal status: INITIAL  4. Pt will decrease 5TSTS by at least 3 seconds in order to demonstrate clinically significant improvement in LE strength      Baseline: *** Goal status: INITIAL  5. Pt will improve DGI by at least 3 points in order to  demonstrate clinically significant improvement in balance and decreased risk for falls.     Baseline: *** Goal status: INITIAL  6. Pt will decrease TUG to below 14 seconds/decrease in order to demonstrate decreased fall risk.  Baseline: *** Goal status: INITIAL  7. Pt will increase by at least 52m (177ft) in order to demonstrate clinically significant improvement in cardiopulmonary endurance and community ambulation   Baseline: *** Goal status: INITIAL   PLAN: PT FREQUENCY: 2x/week  PT DURATION: 12 weeks  PLANNED INTERVENTIONS: Therapeutic exercises, Therapeutic activity, Neuromuscular re-education, Balance training, Gait training, Patient/Family education, Joint manipulation, Joint mobilization, Canalith repositioning, Aquatic Therapy, Dry Needling, Cognitive remediation, Electrical stimulation, Spinal manipulation, Spinal mobilization, Cryotherapy, Moist heat, Traction, Ultrasound, Ionotophoresis 4mg /ml Dexamethasone, and Manual therapy  PLAN FOR NEXT SESSION: ***   Denese Finn, PT, DPT [phone]  Sharon York 09/03/2023, 7:47 AM

## 2023-09-03 NOTE — Telephone Encounter (Signed)
 Copied from CRM [id number]. Topic: General - Other >> Aug 15, 2023 10:03 AM Allyne Areola wrote: Reason for CRM: Patient and [employer] called on a conference call to advise that the [employer] Chair that was ordered for the patient requires a prior authorization. Please advise patient once the PA has been submitted.

## 2023-09-03 NOTE — Telephone Encounter (Signed)
 Called Sharon York [phone] from [employer] regarding the folding chair, he stated that they do not work with insurance companies and the chair would have to be paid out of pocket.   Called patient regarding the information given by Sharon York. Let her know to call Sharon York to discuss cost and any other concerns. Patient verbalized understanding.

## 2023-09-03 NOTE — Telephone Encounter (Signed)
 Copied from CRM [id number]. Topic: General - Other >> Aug 15, 2023 10:03 AM Allyne Areola wrote: Reason for CRM: Patient and [employer] called on a conference call to advise that the [employer] Chair that was ordered for the patient requires a prior authorization. Please advise patient once the PA has been submitted. >> Aug 29, 2023  4:22 PM Zipporah Him wrote: Patient is still waiting on prior authorization for this wheel chair, please call ASAP to follow up as soon as you have information for the patient.  [phone]  she provided this phone number to call if any questions. >> Aug 19, 2023 10:37 AM Donald Frost wrote: The patient called back returning Jessinia's phone call. I will transfer her call to [facility] >> Aug 15, 2023  4:44 PM Crispin Dolphin wrote: Patient states this is a scooter and insurance confirmed it would be approved with authorization

## 2023-09-04 ENCOUNTER — Ambulatory Visit: Admitting: Physical Therapy

## 2023-09-05 ENCOUNTER — Ambulatory Visit: Admitting: Physical Therapy

## 2023-09-09 ENCOUNTER — Telehealth: Payer: Self-pay

## 2023-09-09 NOTE — Telephone Encounter (Signed)
 Copied from CRM [id number]. Topic: General - Other >> Aug 05, 2023  4:56 PM Crispin Dolphin wrote: Reason for CRM: Patient daughter called. States patient may be living with her from time to time and they place where she is getting house built states provider may send in letter to authorize the downstairs bathroom to be remodeled. Would like to know if that is something provider can do. Call back: [phone] Thank You >> Sep 09, 2023  8:13 AM Zipporah Him wrote: Patient is calling back in regard to this, the patients daughter tina, requests an additional note from dr berglund before Wednesday, patient is also needing taller toilets installed instead of standard size. Brian Campanile, would like emailed if at all possible to [email or web address] if not please call and she will pick up from front office.

## 2023-09-09 NOTE — Telephone Encounter (Signed)
 I have spoke with Brian Campanile and informed her that Dr. Gala Jubilee could write letter for patients primary residence but not for temporary one.

## 2023-09-09 NOTE — Telephone Encounter (Signed)
 Pt said the construction co needs this information right away.

## 2023-09-09 NOTE — Telephone Encounter (Signed)
 Spoke with Brian Campanile and she informed me that this would be for her residence when patient was staying with her. I let her know you could not write this for her home but you could for the patients primary residence. She was thankful for the call.   JM

## 2023-09-10 ENCOUNTER — Ambulatory Visit: Admitting: Physical Therapy

## 2023-09-12 ENCOUNTER — Ambulatory Visit: Admitting: Physical Therapy

## 2023-09-17 ENCOUNTER — Encounter: Admitting: Physical Therapy

## 2023-09-18 DIAGNOSIS — E119 Type 2 diabetes mellitus without complications: Secondary | ICD-10-CM | POA: Diagnosis not present

## 2023-09-18 DIAGNOSIS — Z5941 Food insecurity: Secondary | ICD-10-CM | POA: Diagnosis not present

## 2023-09-18 DIAGNOSIS — M79674 Pain in right toe(s): Secondary | ICD-10-CM | POA: Diagnosis not present

## 2023-09-18 DIAGNOSIS — M79675 Pain in left toe(s): Secondary | ICD-10-CM | POA: Diagnosis not present

## 2023-09-18 DIAGNOSIS — Z5986 Financial insecurity: Secondary | ICD-10-CM | POA: Diagnosis not present

## 2023-09-18 DIAGNOSIS — B351 Tinea unguium: Secondary | ICD-10-CM | POA: Diagnosis not present

## 2023-09-18 DIAGNOSIS — B353 Tinea pedis: Secondary | ICD-10-CM | POA: Diagnosis not present

## 2023-09-19 ENCOUNTER — Encounter: Admitting: Physical Therapy

## 2023-09-24 ENCOUNTER — Encounter: Admitting: Physical Therapy

## 2023-09-26 ENCOUNTER — Encounter: Admitting: Physical Therapy

## 2023-09-29 ENCOUNTER — Other Ambulatory Visit: Payer: Self-pay | Admitting: Internal Medicine

## 2023-09-29 DIAGNOSIS — I10 Essential (primary) hypertension: Secondary | ICD-10-CM

## 2023-10-01 ENCOUNTER — Encounter: Admitting: Physical Therapy

## 2023-10-01 NOTE — Telephone Encounter (Signed)
 Requested Prescriptions  Refused Prescriptions Disp Refills   lisinopril  (ZESTRIL ) 10 MG tablet [Pharmacy Med Name: Lisinopril  10 MG Oral Tablet] 100 tablet 2    Sig: TAKE 1 TABLET BY MOUTH DAILY     Cardiovascular:  ACE Inhibitors Failed - 10/01/2023  2:18 PM      Failed - Cr in normal range and within 180 days    Creatinine, Ser  Date Value Ref Range Status  12/13/2022 0.62 0.57 - 1.00 mg/dL Final         Failed - K in normal range and within 180 days    Potassium  Date Value Ref Range Status  12/13/2022 4.5 3.5 - 5.2 mmol/L Final         Failed - Last BP in normal range    BP Readings from Last 1 Encounters:  08/13/23 (!) 140/82         Passed - Patient is not pregnant      Passed - Valid encounter within last 6 months    Recent Outpatient Visits           1 month ago Diabetes mellitus treated with oral medication [facility])   [facility] at [facility], Chales [place], MD       Future Appointments             In 3 months Gala Jubilee Chales [place], MD [facility] at [facility], [facility]

## 2023-10-02 ENCOUNTER — Ambulatory Visit (INDEPENDENT_AMBULATORY_CARE_PROVIDER_SITE_OTHER): Admitting: Internal Medicine

## 2023-10-02 ENCOUNTER — Encounter: Payer: Self-pay | Admitting: Internal Medicine

## 2023-10-02 VITALS — BP 132/74 | HR 82 | Ht <= 58 in | Wt 157.2 lb

## 2023-10-02 DIAGNOSIS — D485 Neoplasm of uncertain behavior of skin: Secondary | ICD-10-CM

## 2023-10-02 DIAGNOSIS — B001 Herpesviral vesicular dermatitis: Secondary | ICD-10-CM | POA: Insufficient documentation

## 2023-10-02 DIAGNOSIS — G8929 Other chronic pain: Secondary | ICD-10-CM | POA: Diagnosis not present

## 2023-10-02 DIAGNOSIS — M25511 Pain in right shoulder: Secondary | ICD-10-CM

## 2023-10-02 MED ORDER — ACYCLOVIR 800 MG PO TABS: 800.0000 mg | ORAL_TABLET | Freq: Three times a day (TID) | ORAL | 0 refills | Status: AC

## 2023-10-02 MED ORDER — TRIAMCINOLONE ACETONIDE 0.025 % EX OINT: 1.0000 | TOPICAL_OINTMENT | Freq: Two times a day (BID) | CUTANEOUS | 0 refills | Status: AC

## 2023-10-02 MED ORDER — ACYCLOVIR 5 % EX CREA
1.0000 | TOPICAL_CREAM | CUTANEOUS | 0 refills | Status: AC
Start: 2023-10-02 — End: ?

## 2023-10-02 NOTE — Progress Notes (Signed)
 Date:  10/02/2023   Name:  Sharon York   DOB:  May 17, 1946   MRN:  [id number]   Chief Complaint: Dian Formosa (Patient said she has a blister on her lip, x 1 week ago, blisters come and go) and Nevus (On left shoulder, itchy, no color change or shape change)  Shoulder Pain  The pain is present in the right shoulder. This is a chronic problem. The problem occurs daily. The problem has been gradually worsening. The quality of the pain is described as aching. Associated symptoms include a limited range of motion.   Lip lesions -   she has recurrent fever blisters on her lips; one is there now and is worsening over the past week.  She uses over the counter topical cream only.  Nevus - on left shoulder, itching but not changing.  Review of Systems  Constitutional:  Negative for chills and fatigue.  Respiratory:  Negative for chest tightness and shortness of breath.   Cardiovascular:  Negative for chest pain.  Musculoskeletal:  Positive for arthralgias.  Skin:  Positive for rash and wound.  Psychiatric/Behavioral:  Negative for dysphoric mood and sleep disturbance. The patient is not nervous/anxious.      Lab Results  Component Value Date   NA 142 12/13/2022   K 4.5 12/13/2022   CO2 27 12/13/2022   GLUCOSE 116 (H) 12/13/2022   BUN 11 12/13/2022   CREATININE 0.62 12/13/2022   CALCIUM  9.1 12/13/2022   EGFR 91 12/13/2022   GFRNONAA 79 12/03/2019   Lab Results  Component Value Date   CHOL 169 12/13/2022   HDL 70 12/13/2022   LDLCALC 83 12/13/2022   TRIG 90 12/13/2022   CHOLHDL 2.4 12/13/2022   Lab Results  Component Value Date   TSH 3.130 12/13/2022   Lab Results  Component Value Date   HGBA1C 6.8 (A) 05/13/2023   Lab Results  Component Value Date   WBC 5.0 12/13/2022   HGB 12.5 12/13/2022   HCT 38.4 12/13/2022   MCV 72 (L) 12/13/2022   PLT 271 12/13/2022   Lab Results  Component Value Date   ALT 16 12/13/2022   AST 17 12/13/2022   ALKPHOS 47 12/13/2022    BILITOT 0.3 12/13/2022   Lab Results  Component Value Date   VD25OH 123.2 (H) 12/11/2021     Patient Active Problem List   Diagnosis Date Noted   Fever blister 10/02/2023   Trigger finger, right middle finger 12/11/2021   Microalbuminuria due to type 2 diabetes mellitus (HCC) 12/07/2019   Diabetes mellitus treated with oral medication (HCC) 04/21/2018   Right shoulder pain 03/31/2018   Hyperlipidemia associated with type 2 diabetes mellitus (HCC) 06/12/2017   Tinea corporis 03/17/2015   Abnormal gait 12/15/2014   Altered bowel function 12/15/2014   Hemiparesis affecting left side as late effect of cerebrovascular accident (CVA) (HCC) 12/15/2014   Obstructive sleep apnea of adult 12/15/2014   Arthritis of shoulder region, 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     Past Surgical History:  Procedure Laterality Date   CESAREAN SECTION  1975   COLONOSCOPY  2010   normal    Social History   Tobacco Use  Smoking status: Never    Passive exposure: Never   Smokeless tobacco: Never   Tobacco comments:    smoking cessation materials not required  Vaping Use   Vaping status: Never Used  Substance Use Topics   Alcohol use: No    Alcohol/week: 0.0 standard drinks of alcohol   Drug use: Never     Medication list has been reviewed and updated.  Current Meds  Medication Sig   ACCU-CHEK GUIDE TEST test strip USE TO CHECK BLOOD GLUCOSE AS  INTRUCTED   Accu-Chek Softclix Lancets lancets USE TO CHECK BLOOD GLUCOSE UP TO 4 TIMES DAILY   acyclovir (ZOVIRAX) 800 MG tablet Take 1 tablet (800 mg total) by mouth 3 (three) times daily for 5 days.   acyclovir cream (ZOVIRAX) 5 % Apply 1 Application  topically every 3 (three) hours. To lip lesion prn   alendronate  (FOSAMAX ) 70 MG tablet TAKE 1 TABLET BY MOUTH ONCE A WEEK WITH A FULL GLASS OF WATER ON AN EMPTY STOMACH   aspirin 81 MG chewable tablet Chew 1 tablet by mouth daily.   atorvastatin  (LIPITOR) 10 MG tablet Take 1 tablet (10 mg total) by mouth daily.   baclofen  (LIORESAL ) 10 MG tablet Take 1 tablet (10 mg total) by mouth 2 (two) times daily.   blood glucose meter kit and supplies Dispense based on patient and insurance preference. Use up to four times daily as directed. (FOR ICD-10 E10.9, E11.9).   ciclopirox (PENLAC) 8 % solution Apply topically at bedtime.   clotrimazole -betamethasone  (LOTRISONE ) cream Apply 1 Application topically 2 (two) times daily. To groin rash   Continuous Glucose Sensor (DEXCOM G7 SENSOR) MISC 1 each by Does not apply route continuous.   lisinopril  (ZESTRIL ) 10 MG tablet Take 1 tablet (10 mg total) by mouth daily.   Melatonin 10 MG TABS Take 1 tablet by mouth at bedtime as needed.   metFORMIN  (GLUCOPHAGE ) 500 MG tablet Take 1 tablet (500 mg total) by mouth daily with breakfast.   Multiple Vitamin (MULTI-VITAMIN) tablet Take 1 tablet by mouth daily.   nystatin  cream (MYCOSTATIN ) Apply 1 Application topically 2 (two) times daily.   triamcinolone  (KENALOG ) 0.025 % ointment Apply 1 Application topically 2 (two) times daily.       10/02/2023    9:15 AM 08/13/2023    3:41 PM 05/13/2023   10:02 AM 03/20/2023   11:13 AM  GAD 7 : Generalized Anxiety Score  Nervous, Anxious, on Edge 0 0 0 0  Control/stop worrying 0 0 0 0  Worry too much - different things  0 0 0  Trouble relaxing  0 0 0  Restless  0 0 0  Easily annoyed or irritable  0 0 0  Afraid - awful might happen  0 0 0  Total GAD 7 Score  0 0 0  Anxiety Difficulty  Not difficult at all Not difficult at all Not difficult at all       10/02/2023    9:15 AM 08/13/2023    3:41 PM 08/08/2023   10:19 AM  Depression screen PHQ 2/9  Decreased Interest 0 0  0  Down, Depressed, Hopeless 0 0 0  PHQ - 2 Score 0 0 0  Altered sleeping  0 0  Tired, decreased energy  1 1  Change in appetite  0 0  Feeling bad or failure about yourself   0 0  Trouble concentrating  0 0  Moving slowly or fidgety/restless  0 0  Suicidal thoughts  0  0  PHQ-9 Score  1 1  Difficult doing work/chores  Not difficult at all Not difficult at all    BP Readings from Last 3 Encounters:  10/02/23 132/74  08/13/23 (!) 140/82  05/13/23 128/78    Physical Exam Vitals and nursing note reviewed.  Constitutional:      General: She is not in acute distress.    Appearance: Normal appearance. She is well-developed.  HENT:     Head: Normocephalic and atraumatic.     Mouth/Throat:      Comments: Fever blister Cardiovascular:     Rate and Rhythm: Normal rate and regular rhythm.  Pulmonary:     Effort: Pulmonary effort is normal. No respiratory distress.     Breath sounds: No wheezing or rhonchi.  Musculoskeletal:     Right shoulder: No swelling, effusion or bony tenderness. Decreased range of motion.     Cervical back: Normal range of motion.  Lymphadenopathy:     Cervical: No cervical adenopathy.  Skin:    General: Skin is warm and dry.     Findings: No rash.          Comments: Nevus on right shoulder - benign appearance  Neurological:     Mental Status: She is alert and oriented to person, place, and time.  Psychiatric:        Mood and Affect: Mood normal.        Behavior: Behavior normal.     Wt Readings from Last 3 Encounters:  10/02/23 157 lb 4 oz (71.3 kg)  08/13/23 161 lb 6 oz (73.2 kg)  08/08/23 159 lb (72.1 kg)    BP 132/74   Pulse 82   Ht 4\' 9"  (1.448 m)   Wt 157 lb 4 oz (71.3 kg)   SpO2 95%   BMI 34.03 kg/m   Assessment and Plan:  Problem List Items Addressed This Visit       Unprioritized   Right shoulder pain   More limited ROM, esp abduction Recommend tylenol, rubs like BenGay and heat Consider SM evaluation      Fever  blister   Relevant Medications   ciclopirox (PENLAC) 8 % solution   acyclovir (ZOVIRAX) 800 MG tablet   acyclovir cream (ZOVIRAX) 5 %   Other Visit Diagnoses       Neoplasm of uncertain behavior of skin    -  Primary   Relevant Medications   triamcinolone  (KENALOG ) 0.025 % ointment   Other Relevant Orders   Ambulatory referral to Dermatology       No follow-ups on file.    Sheron Dixons, MD [facility] [place]

## 2023-10-02 NOTE — Assessment & Plan Note (Signed)
 More limited ROM, esp abduction Recommend tylenol, rubs like BenGay and heat Consider SM evaluation

## 2023-10-03 ENCOUNTER — Encounter: Admitting: Physical Therapy

## 2023-10-08 ENCOUNTER — Encounter: Admitting: Physical Therapy

## 2023-10-10 ENCOUNTER — Encounter: Admitting: Physical Therapy

## 2023-10-11 ENCOUNTER — Other Ambulatory Visit: Payer: Self-pay | Admitting: Internal Medicine

## 2023-10-11 DIAGNOSIS — E118 Type 2 diabetes mellitus with unspecified complications: Secondary | ICD-10-CM

## 2023-10-13 ENCOUNTER — Other Ambulatory Visit: Payer: Self-pay | Admitting: Internal Medicine

## 2023-10-13 DIAGNOSIS — I10 Essential (primary) hypertension: Secondary | ICD-10-CM

## 2023-10-15 ENCOUNTER — Encounter: Admitting: Physical Therapy

## 2023-10-15 NOTE — Telephone Encounter (Signed)
 Requested Prescriptions  Refused Prescriptions Disp Refills   metFORMIN  (GLUCOPHAGE ) 500 MG tablet [Pharmacy Med Name: metFORMIN  HCl 500 MG Oral Tablet] 100 tablet 2    Sig: TAKE 1 TABLET BY MOUTH DAILY  WITH BREAKFAST     Endocrinology:  Diabetes - Biguanides Failed - 10/15/2023  5:00 PM      Failed - B12 Level in normal range and within 720 days    No results found for: "VITAMINB12"       Passed - Cr in normal range and within 360 days    Creatinine, Ser  Date Value Ref Range Status  12/13/2022 0.62 0.57 - 1.00 mg/dL Final         Passed - HBA1C is between 0 and 7.9 and within 180 days    Hemoglobin A1C  Date Value Ref Range Status  05/13/2023 6.8 (A) 4.0 - 5.6 % Final   Hgb A1c MFr Bld  Date Value Ref Range Status  12/13/2022 7.5 (H) 4.8 - 5.6 % Final    Comment:             Prediabetes: 5.7 - 6.4          Diabetes: >6.4          Glycemic control for adults with diabetes: <7.0          Passed - eGFR in normal range and within 360 days    GFR calc Af Amer  Date Value Ref Range Status  12/03/2019 91 >59 mL/min/1.73 Final    Comment:    **[facility] currently reports eGFR in compliance with the current**   recommendations of the [employer]. [facility] will   update reporting as new guidelines are published from the NKF-ASN   Task force.    GFR calc non Af Amer  Date Value Ref Range Status  12/03/2019 79 >59 mL/min/1.73 Final   eGFR  Date Value Ref Range Status  12/13/2022 91 >59 mL/min/1.73 Final         Passed - Valid encounter within last 6 months    Recent Outpatient Visits           1 week ago Neoplasm of uncertain behavior of skin   [place] [facility] at [facility], Chales [place], MD   2 months ago Diabetes mellitus treated with oral medication [facility])   [place] [facility] at [facility], Chales [place], MD       Future Appointments             In 2 months Sheron Dixons, MD  [facility] at [facility], [facility]            Passed - CBC within normal limits and completed in the last 12 months    WBC  Date Value Ref Range Status  12/13/2022 5.0 3.4 - 10.8 x10E3/uL Final  06/22/2017 9.6 3.6 - 11.0 K/uL Final   RBC  Date Value Ref Range Status  12/13/2022 5.31 (H) 3.77 - 5.28 x10E6/uL Final  01/22/2018 2 (A) 4.04 - 5.48 M/uL Final  06/22/2017 5.33 (H) 3.80 - 5.20 MIL/uL Final   Hemoglobin  Date Value Ref Range Status  12/13/2022 12.5 11.1 - 15.9 g/dL Final   Hematocrit  Date Value Ref Range Status  12/13/2022 38.4 34.0 - 46.6 % Final   MCHC  Date Value Ref Range Status  12/13/2022 32.6 31.5 - 35.7 g/dL Final  09/81/1914 32.9  32.0 - 36.0 g/dL Final   [facility]  Date Value Ref Range Status  12/13/2022 23.5 (L) 26.6 - 33.0 pg Final  06/22/2017 23.6 (L) 26.0 - 34.0 pg Final   MCV  Date Value Ref Range Status  12/13/2022 72 (L) 79 - 97 fL Final   No results found for: "PLTCOUNTKUC", "LABPLAT", "POCPLA" RDW  Date Value Ref Range Status  12/13/2022 17.0 (H) 11.7 - 15.4 % Final

## 2023-10-16 NOTE — Telephone Encounter (Signed)
 Requested Prescriptions  Pending Prescriptions Disp Refills   lisinopril  (ZESTRIL ) 10 MG tablet [Pharmacy Med Name: Lisinopril  10 MG Oral Tablet] 100 tablet 0    Sig: TAKE 1 TABLET BY MOUTH DAILY     Cardiovascular:  ACE Inhibitors Failed - 10/16/2023  4:14 PM      Failed - Cr in normal range and within 180 days    Creatinine, Ser  Date Value Ref Range Status  12/13/2022 0.62 0.57 - 1.00 mg/dL Final         Failed - K in normal range and within 180 days    Potassium  Date Value Ref Range Status  12/13/2022 4.5 3.5 - 5.2 mmol/L Final         Passed - Patient is not pregnant      Passed - Last BP in normal range    BP Readings from Last 1 Encounters:  10/02/23 132/74         Passed - Valid encounter within last 6 months    Recent Outpatient Visits           2 weeks ago Neoplasm of uncertain behavior of skin   [place] [facility] at [facility], Chales [place], MD   2 months ago Diabetes mellitus treated with oral medication [facility])   [place] [facility] at [facility], Chales [place], MD       Future Appointments             In 2 months Gala Jubilee, Chales [place], MD [facility] Primary Care & Sports Medicine at [facility], [facility]

## 2023-10-17 ENCOUNTER — Encounter: Admitting: Physical Therapy

## 2023-10-18 ENCOUNTER — Other Ambulatory Visit: Payer: Self-pay | Admitting: Internal Medicine

## 2023-10-18 NOTE — Telephone Encounter (Signed)
 Copied from CRM [id number]. Topic: Clinical - Medication Refill >> Oct 18, 2023  4:49 PM Phil Braun wrote: Pt is requesting this kit because the clicker in hers has stopped working.    Medication: blood glucose meter kit and supplies  Has the patient contacted their pharmacy? No   This is the patient's preferred pharmacy:  [facility] [address] [place] - [address] [place] [address] Phone: [phone] Fax: [phone]  Is this the correct pharmacy for this prescription? Yes If no, delete pharmacy and type the correct one.   Has the prescription been filled recently? No  Is the patient out of the medication? Yes  Has the patient been seen for an appointment in the last year OR does the patient have an upcoming appointment? Yes  Can we respond through MyChart? No  Agent: Please be advised that Rx refills may take up to 3 business days. We ask that you follow-up with your pharmacy.

## 2023-10-19 MED ORDER — BLOOD GLUCOSE METER KIT: PACK | 0 refills | Status: DC

## 2023-10-19 MED ORDER — BLOOD GLUCOSE METER KIT: PACK | 0 refills | Status: AC

## 2023-10-19 NOTE — Telephone Encounter (Signed)
 Requested Prescriptions  Pending Prescriptions Disp Refills   blood glucose meter kit and supplies 1 each 0    Sig: Dispense based on patient and insurance preference. Use up to four times daily as directed. (FOR ICD-10 E10.9, E11.9).     Endocrinology: Diabetes - Testing Supplies Passed - 10/19/2023  1:45 PM      Passed - Valid encounter within last 12 months    Recent Outpatient Visits           2 weeks ago Neoplasm of uncertain behavior of skin   [place] [facility] at [facility], Chales [place], MD   2 months ago Diabetes mellitus treated with oral medication [facility])   [place] [facility] at [facility], Chales [place], MD       Future Appointments             In 2 months Gala Jubilee, Chales [place], MD [facility] Primary Care & Sports Medicine at [facility], [facility]

## 2023-10-19 NOTE — Addendum Note (Signed)
 Addended by: Eastyn Skalla M on: 10/19/2023 01:47 PM   Modules accepted: Orders

## 2023-10-21 DIAGNOSIS — L821 Other seborrheic keratosis: Secondary | ICD-10-CM | POA: Diagnosis not present

## 2023-10-21 NOTE — Telephone Encounter (Signed)
 Copied from CRM [id number]. Topic: Clinical - Medication Refill >> Oct 18, 2023  4:49 PM Phil Braun wrote: Pt is requesting this kit because the clicker in hers has stopped working.    Medication: blood glucose meter kit and supplies  Has the patient contacted their pharmacy? No   This is the patient's preferred pharmacy:  [facility] [address], [place] - [address] [place] [address] Phone: [phone] Fax: [phone]  Is this the correct pharmacy for this prescription? Yes If no, delete pharmacy and type the correct one.   Has the prescription been filled recently? No  Is the patient out of the medication? Yes  Has the patient been seen for an appointment in the last year OR does the patient have an upcoming appointment? Yes  Can we respond through MyChart? No  Agent: Please be advised that Rx refills may take up to 3 business days. We ask that you follow-up with your pharmacy. >> Oct 21, 2023 12:51 PM Fredrica W wrote: Patient called back. States she needs accu-check soft click meter and strips sent in. States they did not have it at the pharmacy and they one they gave her is not what she is used to. She would like someone to give to a call back to get this straightened out. Than You .

## 2023-10-21 NOTE — Telephone Encounter (Signed)
 RX for glucometer and supplies sent in 10/19/2023.

## 2023-11-07 ENCOUNTER — Encounter: Payer: Self-pay | Admitting: Internal Medicine

## 2023-11-07 ENCOUNTER — Ambulatory Visit (INDEPENDENT_AMBULATORY_CARE_PROVIDER_SITE_OTHER): Admitting: Internal Medicine

## 2023-11-07 VITALS — BP 118/72 | HR 97 | Temp 98.2°F | Ht <= 58 in | Wt 155.0 lb

## 2023-11-07 DIAGNOSIS — J019 Acute sinusitis, unspecified: Secondary | ICD-10-CM | POA: Diagnosis not present

## 2023-11-07 MED ORDER — AZITHROMYCIN 250 MG PO TABS: ORAL_TABLET | ORAL | 0 refills | Status: AC

## 2023-11-07 NOTE — Progress Notes (Signed)
 Date:  11/07/2023   Name:  Sharon York   DOB:  11-Jan-1946   MRN:  [id number]   Chief Complaint: URI (Sore throat, runny nose, headache, and cough- no production. No fever. No SOB.)  URI  This is a new problem. The current episode started yesterday. There has been no fever. Associated symptoms include congestion, coughing, headaches, sinus pain and a sore throat. Pertinent negatives include no chest pain, ear pain, plugged ear sensation or wheezing. She has tried nothing for the symptoms.    Review of Systems  Constitutional:  Positive for fatigue. Negative for chills and fever.  HENT:  Positive for congestion, hearing loss, sinus pain and sore throat. Negative for ear pain and trouble swallowing.   Respiratory:  Positive for cough. Negative for wheezing.   Cardiovascular:  Negative for chest pain and palpitations.  Neurological:  Positive for headaches. Negative for dizziness and light-headedness.     Lab Results  Component Value Date   NA 142 12/13/2022   K 4.5 12/13/2022   CO2 27 12/13/2022   GLUCOSE 116 (H) 12/13/2022   BUN 11 12/13/2022   CREATININE 0.62 12/13/2022   CALCIUM  9.1 12/13/2022   EGFR 91 12/13/2022   GFRNONAA 79 12/03/2019   Lab Results  Component Value Date   CHOL 169 12/13/2022   HDL 70 12/13/2022   LDLCALC 83 12/13/2022   TRIG 90 12/13/2022   CHOLHDL 2.4 12/13/2022   Lab Results  Component Value Date   TSH 3.130 12/13/2022   Lab Results  Component Value Date   HGBA1C 6.8 (A) 05/13/2023   Lab Results  Component Value Date   WBC 5.0 12/13/2022   HGB 12.5 12/13/2022   HCT 38.4 12/13/2022   MCV 72 (L) 12/13/2022   PLT 271 12/13/2022   Lab Results  Component Value Date   ALT 16 12/13/2022   AST 17 12/13/2022   ALKPHOS 47 12/13/2022   BILITOT 0.3 12/13/2022   Lab Results  Component Value Date   VD25OH 123.2 (H) 12/11/2021     Patient Active Problem List   Diagnosis Date Noted   Fever blister 10/02/2023   Trigger finger, right  middle finger 12/11/2021   Microalbuminuria due to type 2 diabetes mellitus (HCC) 12/07/2019   Diabetes mellitus treated with oral medication (HCC) 04/21/2018   Right shoulder pain 03/31/2018   Hyperlipidemia associated with type 2 diabetes mellitus (HCC) 06/12/2017   Tinea corporis 03/17/2015   Abnormal gait 12/15/2014   Altered bowel function 12/15/2014   Hemiparesis affecting left side as late effect of cerebrovascular accident (CVA) (HCC) 12/15/2014   Obstructive sleep apnea of adult 12/15/2014   Arthritis of shoulder region, 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     Past Surgical History:  Procedure Laterality Date   CESAREAN SECTION  1975   COLONOSCOPY  2010   normal    Social History   Tobacco Use   Smoking status: Never    Passive exposure: Never   Smokeless tobacco: Never   Tobacco comments:    smoking cessation materials not required  Vaping Use   Vaping status: Never Used  Substance Use Topics   Alcohol use: No    Alcohol/week:  0.0 standard drinks of alcohol   Drug use: Never     Medication list has been reviewed and updated.  Current Meds  Medication Sig   azithromycin  (ZITHROMAX  Z-PAK) 250 MG tablet UAD       10/02/2023    9:15 AM 08/13/2023    3:41 PM 05/13/2023   10:02 AM 03/20/2023   11:13 AM  GAD 7 : Generalized Anxiety Score  Nervous, Anxious, on Edge 0 0 0 0  Control/stop worrying 0 0 0 0  Worry too much - different things  0 0 0  Trouble relaxing  0 0 0  Restless  0 0 0  Easily annoyed or irritable  0 0 0  Afraid - awful might happen  0 0 0  Total GAD 7 Score  0 0 0  Anxiety Difficulty  Not difficult at all Not difficult at all Not difficult at all        10/02/2023    9:15 AM 08/13/2023    3:41 PM 08/08/2023   10:19 AM  Depression screen PHQ 2/9  Decreased Interest 0 0 0  Down, Depressed, Hopeless 0 0 0  PHQ - 2 Score 0 0 0  Altered sleeping  0 0  Tired, decreased energy  1 1  Change in appetite  0 0  Feeling bad or failure about yourself   0 0  Trouble concentrating  0 0  Moving slowly or fidgety/restless  0 0  Suicidal thoughts  0 0  PHQ-9 Score  1 1  Difficult doing work/chores  Not difficult at all Not difficult at all    BP Readings from Last 3 Encounters:  11/07/23 118/72  10/02/23 132/74  08/13/23 (!) 140/82    Physical Exam Vitals and nursing note reviewed.  Constitutional:      General: She is not in acute distress.    Appearance: Normal appearance. She is well-developed.  HENT:     Head: Normocephalic and atraumatic.     Right Ear: Tympanic membrane is retracted. Tympanic membrane is not erythematous.     Left Ear: Tympanic membrane is retracted. Tympanic membrane is not erythematous.     Nose:     Right Sinus: No maxillary sinus tenderness.     Left Sinus: No maxillary sinus tenderness.     Mouth/Throat:     Pharynx: Posterior oropharyngeal erythema present. No oropharyngeal exudate.   Cardiovascular:     Rate and Rhythm: Normal rate and regular rhythm.  Pulmonary:     Effort: Pulmonary effort is normal. No respiratory distress.     Breath sounds: Transmitted upper airway sounds present. Examination of the left-upper field reveals decreased breath sounds. Decreased breath sounds present.   Musculoskeletal:     Cervical back: Normal range of motion.  Lymphadenopathy:     Cervical: No cervical adenopathy.   Skin:    General: Skin is warm and dry.     Findings: No rash.   Neurological:     Mental Status: She is alert and oriented to person, place, and time.   Psychiatric:        Mood and Affect: Mood normal.        Behavior: Behavior normal.     Wt Readings from Last 3 Encounters:   11/07/23 155 lb (70.3 kg)  10/02/23 157 lb 4 oz (71.3 kg)  08/13/23 161 lb 6 oz (73.2 kg)    BP 118/72   Pulse 97   Temp 98.2 F (36.8 C) (Oral)  Ht 4' 9 (1.448 m)   Wt 155 lb (70.3 kg)   SpO2 94%   BMI 33.54 kg/m   Assessment and Plan:  Problem List Items Addressed This Visit   None Visit Diagnoses       Acute non-recurrent sinusitis, unspecified location    -  Primary   suspect sinusitis with ear congestion, drainage/sore throat and cough get OTC Mucinex DM and Claritin or Allegra.   Relevant Medications   azithromycin  (ZITHROMAX  Z-PAK) 250 MG tablet       No follow-ups on file.    Sheron Dixons, MD [facility] Primary Care and Sports Medicine [place]

## 2023-11-07 NOTE — Patient Instructions (Addendum)
 Get Mucinex DM  - take twice a day.   Take Allegra 180 mg OR Claritin 10 mg once a day for post nasal drip.

## 2023-11-08 ENCOUNTER — Other Ambulatory Visit: Payer: Self-pay | Admitting: Internal Medicine

## 2023-11-08 ENCOUNTER — Other Ambulatory Visit (INDEPENDENT_AMBULATORY_CARE_PROVIDER_SITE_OTHER): Payer: Self-pay | Admitting: Radiology

## 2023-11-08 ENCOUNTER — Ambulatory Visit: Admitting: Family Medicine

## 2023-11-08 ENCOUNTER — Encounter: Payer: Self-pay | Admitting: Family Medicine

## 2023-11-08 VITALS — BP 132/74 | HR 89 | Ht <= 58 in | Wt 155.0 lb

## 2023-11-08 DIAGNOSIS — M75101 Unspecified rotator cuff tear or rupture of right shoulder, not specified as traumatic: Secondary | ICD-10-CM | POA: Diagnosis not present

## 2023-11-08 DIAGNOSIS — E1169 Type 2 diabetes mellitus with other specified complication: Secondary | ICD-10-CM

## 2023-11-08 MED ORDER — TRIAMCINOLONE ACETONIDE 40 MG/ML IJ SUSP
40.0000 mg | Freq: Once | INTRAMUSCULAR | Status: AC
  Administered 2023-11-08: 40 mg via INTRAMUSCULAR

## 2023-11-08 NOTE — Assessment & Plan Note (Signed)
 RHD patient with years of right shoulder discomfor

## 2023-11-10 NOTE — Progress Notes (Signed)
     Primary Care / Sports Medicine Office Visit  Patient Information:  Patient ID: Sharon York, female DOB: 1946-03-19 Age: 78 y.o. MRN: [id number]   Sharon York is a pleasant 78 y.o. female presenting with the following:  Chief Complaint  Patient presents with   Shoulder Pain    Right shoulder pain for a little while now. Has gotten worse over the last week. Patient unable to move arm do to the pain. She is not taking anything for the pain. She is hoping to get a cortisone injection today to help wit the pain and movement.     Vitals:   11/08/23 0817  BP: 132/74  Pulse: 89  SpO2: 94%   Vitals:   11/08/23 0817  Weight: 155 lb (70.3 kg)  Height: 4' 9 (1.448 m)   Body mass index is 33.54 kg/m.  No results found.   Independent interpretation of notes and tests performed by another provider:   None  Procedures performed:   Procedure:  Injection of right shoulder under ultrasound guidance. Ultrasound guidance utilized ro subacromial approach, no abnormalities noted sonographically Samsung HS60 device utilized with permanent recording / reporting. Verbal informed consent obtained and verified. Skin prepped in a sterile fashion. Ethyl chloride for topical local analgesia.  Completed without difficulty and tolerated well. Medication: triamcinolone  acetonide 40 mg/mL suspension for injection 1 mL total and 2 mL lidocaine  1% without epinephrine utilized for needle placement anesthetic Advised to contact for fevers/chills, erythema, induration, drainage, or persistent bleeding.  Pertinent History, Exam, Impression, and Recommendations:   Problem List Items Addressed This Visit     Rotator cuff syndrome, right - Primary   History of Present Illness Sharon York is an 78 year old female with rotator cuff syndrome who presents with right shoulder pain.  She experiences persistent right shoulder pain primarily located in the shoulder, accompanied by tightness, which  affects her ability to perform daily activities. She is currently using baclofen  to manage muscle tightness.  Additionally, she has been experiencing neck tightness, which may be contributing to her shoulder discomfort.  Physical Exam PALPATION: Right shoulder with signs consistent with rotator cuff syndrome. Neck exam normal.  Assessment and Plan Rotator cuff syndrome Chronic right shoulder pain due to rotator cuff syndrome. Cortisone injection expected to provide relief. Baclofen  recommended for muscle tightness. - Administer ultrasound-guided cortisone injection to right shoulder. - Apply ice to shoulder for 20 minutes before bed. - Limit activity today and tomorrow, resume normal activity by Sunday. - Encourage natural shoulder movement post-cortisone effect. - Take baclofen  up to three times daily for tightness, space doses at least eight hours apart. - Contact if numbness or symptoms persist after two weeks.      Relevant Orders   US  LIMITED JOINT SPACE STRUCTURES UP RIGHT     Orders & Medications Medications:  Meds ordered this encounter  Medications   triamcinolone  acetonide (KENALOG -40) injection 40 mg   Orders Placed This Encounter  Procedures   US  LIMITED JOINT SPACE STRUCTURES UP RIGHT     No follow-ups on file.     Selinda JINNY Ku, MD, [facility]   Primary Care Sports Medicine Primary Care and Sports Medicine at [facility] [place]

## 2023-11-10 NOTE — Patient Instructions (Signed)
 You have just been given a cortisone injection to reduce pain and inflammation. After the injection you may notice immediate relief of pain as a result of the Lidocaine . It is important to rest the area of the injection for 24 to 48 hours after the injection. There is a possibility of some temporary increased discomfort and swelling for up to 72 hours until the cortisone begins to work. If you do have pain, simply rest the joint and use ice. If you can tolerate over the counter medications, you can try Tylenol, Aleve, or Advil for added relief per package instructions.  ROTATOR CUFF SYNDROME: Chronic right shoulder pain due to rotator cuff syndrome. Cortisone injection expected to provide relief. Baclofen  recommended for muscle tightness. -You received an ultrasound-guided cortisone injection to your right shoulder. -Apply ice to your shoulder for 20 minutes before bed. -Limit your activity today and tomorrow, and resume normal activity by Sunday. -Encourage natural shoulder movement once the cortisone takes effect. -Take baclofen  up to three times daily for muscle tightness, spacing doses at least eight hours apart. -Contact us  if you experience numbness or if symptoms persist after two weeks.

## 2023-11-12 NOTE — Telephone Encounter (Signed)
 Requested Prescriptions  Pending Prescriptions Disp Refills   atorvastatin  (LIPITOR) 10 MG tablet [Pharmacy Med Name: Atorvastatin  Calcium  10 MG Oral Tablet] 100 tablet 0    Sig: TAKE 1 TABLET BY MOUTH DAILY     Cardiovascular:  Antilipid - Statins Failed - 11/12/2023 11:02 AM      Failed - Lipid Panel in normal range within the last 12 months    Cholesterol, Total  Date Value Ref Range Status  12/13/2022 169 100 - 199 mg/dL Final   LDL Chol Calc (NIH)  Date Value Ref Range Status  12/13/2022 83 0 - 99 mg/dL Final   HDL  Date Value Ref Range Status  12/13/2022 70 >39 mg/dL Final   Triglycerides  Date Value Ref Range Status  12/13/2022 90 0 - 149 mg/dL Final         Passed - Patient is not pregnant      Passed - Valid encounter within last 12 months    Recent Outpatient Visits           4 days ago Rotator cuff syndrome, right   [place] [facility] at [facility] [place], Selinda PARAS, MD   5 days ago Acute non-recurrent sinusitis, unspecified location   [facility] & [facility] Medicine at [facility], Leita DEL, MD   1 month ago Neoplasm of uncertain behavior of skin   [place] [facility] at [facility], Leita DEL, MD   3 months ago Diabetes mellitus treated with oral medication [facility])   [place] [facility] Medicine at [facility], Leita DEL, MD       Future Appointments             In 1 month Justus, Leita DEL, MD [facility] Health Primary Care & Sports Medicine at [facility], [facility]

## 2023-12-05 ENCOUNTER — Ambulatory Visit: Payer: Self-pay | Admitting: *Deleted

## 2023-12-05 NOTE — Telephone Encounter (Signed)
 Copied from CRM [id number]. Topic: Clinical - Red Word Triage >> Dec 05, 2023  7:55 AM Donna BRAVO wrote: Red Word that prompted transfer to Nurse Triage: patient fell Tuesday morning, against  night stand on the left side Reason for Disposition  [1] MODERATE weakness (e.g., interferes with work, school, normal activities) AND [2] new-onset or getting worse    Fell Tues morning and hit left side against night stand.   Had a stroke that affected left side years ago.   I can't feel anything on my  left side as a result.  Answer Assessment - Initial Assessment Questions 1. MECHANISM: How did the fall happen?     Tues morning I fell against my night stand.   I hit on my left side. Under my ribcage area and below my waist line.   I don't know what I was doing when I fell.   I was getting up from bed.  I don't remember what happened.   My daughter asked me what happened and I could not tell her.   I was walking around when it happened.  Next thing I knew I was on the floor.   Denied having a dizzy spell before falling.  After I fell I was able to call my daughter from downstairs to come help me up.      I've fallen several times.   I've not hurt myself except for bruising.   Denies shortness of breath, dizziness, or chest pain.      I will bring in my blood sugar checks to see if that's part of the problem.   Sometimes it's high.   It's because of something I ate then it goes back to normal.    I have been eating a lot of watermelon lately.   I know I should not eat it as much as I should but I do. 2. DOMESTIC VIOLENCE AND ELDER ABUSE SCREENING: Did you fall because someone pushed you or tried to hurt you? If Yes, ask: Are you safe now?     N/A 3. ONSET: When did the fall happen? (e.g., minutes, hours, or days ago)     Tues morning.   I've not fallen in a while, maybe 6 months ago.   I don't remember. 4. LOCATION: What part of the body hit the ground? (e.g., back, buttocks, head, hips, knees,  hands, head, stomach)     My left side hit the night stand.    I had a stroke a long time ago so I use a cane and wear a brace on my leg.    5. INJURY: Did you hurt (injure) yourself when you fell? If Yes, ask: What did you injure? Tell me more about this? (e.g., body area; type of injury; pain severity)     It didn't break the skin just bruised. 6. PAIN: Is there any pain? If Yes, ask: How bad is the pain? (e.g., Scale 0-10; or none, mild,      No pain. 7. SIZE: For cuts, bruises, or swelling, ask: How large is it? (e.g., inches or centimeters)      No cuts just a bruise.  My daughter noticed the bruise on my left side.   I didn't know it was there. 8. PREGNANCY: Is there any chance you are pregnant? When was your last menstrual period?     N/A 9. OTHER SYMPTOMS: Do you have any other symptoms? (e.g., dizziness, fever, weakness; new-onset or worsening).  Doesn't remember falling or what happened. 10. CAUSE: What do you think caused the fall (or falling)? (e.g., dizzy spell, tripped)       I don't know.  Protocols used: Falls and Falling-A-AH FYI Only or Action Required?: FYI only for provider.  Patient was last seen in primary care on 11/08/2023 by Alvia Selinda PARAS, MD.  Called Nurse Triage reporting Fall. Tues. Morning fell and hit left side against night stand.  Has a bruise.   Does not remember falling.  Had a stroke a few years ago that affected left side so can't feel anything on left side.  Daughter noticed the bruise.     Symptoms began several days ago.  Interventions attempted: Nothing.  Symptoms are: stable.  Triage Disposition: See HCP Within 4 Hours (Or PCP Triage)  Patient/caregiver understands and will follow disposition?:  Yes. When trying to get her scheduled she noticed she had a conflicting appt at the same time I made her an appt with Dr Justus.   I had to cancel that appt.   She is going to call the lady who drives her to her appts  regarding her schedule then call us  back.   She also mentioned at the end of the conversation she wants to see Dr. Alvia about her right shoulder but wants to see him  as soon as possible after seeing Dr. Justus so she doesn't have to make 2 trips.   I have routed this to the practice as an FYI.   Pt agreeable to the disposition but is working on getting scheduled around other appts she has.  She has to be in [place], [place] at 9:00 12/06/2023.

## 2023-12-06 ENCOUNTER — Ambulatory Visit (INDEPENDENT_AMBULATORY_CARE_PROVIDER_SITE_OTHER): Admitting: Family Medicine

## 2023-12-06 ENCOUNTER — Ambulatory Visit: Admitting: Internal Medicine

## 2023-12-06 ENCOUNTER — Other Ambulatory Visit: Payer: Self-pay | Admitting: Internal Medicine

## 2023-12-06 ENCOUNTER — Encounter: Payer: Self-pay | Admitting: Internal Medicine

## 2023-12-06 ENCOUNTER — Encounter: Payer: Self-pay | Admitting: Family Medicine

## 2023-12-06 VITALS — BP 128/76 | HR 80 | Ht <= 58 in | Wt 149.0 lb

## 2023-12-06 VITALS — BP 124/68 | HR 84 | Ht <= 58 in | Wt 149.0 lb

## 2023-12-06 DIAGNOSIS — M75101 Unspecified rotator cuff tear or rupture of right shoulder, not specified as traumatic: Secondary | ICD-10-CM | POA: Diagnosis not present

## 2023-12-06 DIAGNOSIS — E119 Type 2 diabetes mellitus without complications: Secondary | ICD-10-CM

## 2023-12-06 DIAGNOSIS — I10 Essential (primary) hypertension: Secondary | ICD-10-CM

## 2023-12-06 DIAGNOSIS — Z7984 Long term (current) use of oral hypoglycemic drugs: Secondary | ICD-10-CM

## 2023-12-06 DIAGNOSIS — E118 Type 2 diabetes mellitus with unspecified complications: Secondary | ICD-10-CM

## 2023-12-06 DIAGNOSIS — M810 Age-related osteoporosis without current pathological fracture: Secondary | ICD-10-CM

## 2023-12-06 DIAGNOSIS — M25511 Pain in right shoulder: Secondary | ICD-10-CM

## 2023-12-06 DIAGNOSIS — G8929 Other chronic pain: Secondary | ICD-10-CM

## 2023-12-06 DIAGNOSIS — I69354 Hemiplegia and hemiparesis following cerebral infarction affecting left non-dominant side: Secondary | ICD-10-CM

## 2023-12-06 DIAGNOSIS — H1131 Conjunctival hemorrhage, right eye: Secondary | ICD-10-CM

## 2023-12-06 DIAGNOSIS — S20222A Contusion of left back wall of thorax, initial encounter: Secondary | ICD-10-CM

## 2023-12-06 MED ORDER — BACLOFEN 10 MG PO TABS: 10.0000 mg | ORAL_TABLET | Freq: Two times a day (BID) | ORAL | 1 refills | Status: AC

## 2023-12-06 NOTE — Assessment & Plan Note (Signed)
 History of Present Illness Sharon York is an 78 year old female who presents with right shoulder stiffness following a prior injection.  Right shoulder stiffness - Reported significant stiffness in the right shoulder since last visit on June 20th, 2025 - Stiffness limits range of motion and functional use of the right shoulder - No current pain in the right shoulder following prior injection  Physical Exam INSPECTION: Normal musculoskeletal exam except for right shoulder findings. RANGE OF MOTION: Right shoulder external rotation 45 degrees, painless. Right shoulder abduction with 20 degrees of deficit. Right shoulder forward flexion with 10-15 degrees of deficit.  Motions are painless.  RC testing improved.  Assessment and Plan Right rotator cuff syndrome  Significant improvement in symptomatology and function noted today.  Patient reports being apprehensive to try and use her shoulder prior to this visit.  Reassurance provided given her excellent interim objective improvement.  Risk of frozen shoulder if not used regularly. - Encouraged regular shoulder use in daily activities to prevent stiffness and frozen shoulder. - Referred to home health for physical therapy, 1-2 times a week for 6-8 weeks, focusing on the right shoulder. - Ensure home health contacts her at [phone].  Shoulder pain No current pain. - Consider repeating corticosteroid injection if pain returns by September or October. - Use ice for 20 minutes as needed for pain and inflammation. - Use Voltaren gel up to four times a day if needed.

## 2023-12-06 NOTE — Patient Instructions (Addendum)
 Patient Plan for Post-Visit Guidance  Right Shoulder Rotator Cuff Syndrome  * Continue using your shoulder in daily activities to maintain mobility and prevent frozen shoulder. * Referred to home health physical therapy weekly for 6-8 weeks; they will contact you at [phone]. * Use ice and/or heat for 20 minutes as needed for pain or inflammation. * Apply Voltaren gel up to four times daily if needed. * If pain recurs by September or October, consider repeating corticosteroid injection. * Notify the office if you experience increased stiffness or worsening pain.  Red Flags: - If you experience increased stiffness or pain, contact the office for further evaluation.

## 2023-12-06 NOTE — Telephone Encounter (Signed)
 Requested Prescriptions  Refused Prescriptions Disp Refills   metFORMIN  (GLUCOPHAGE ) 500 MG tablet [Pharmacy Med Name: metFORMIN  HCl 500 MG Oral Tablet] 60 tablet 5    Sig: TAKE 1 TABLET BY MOUTH DAILY  WITH BREAKFAST     Endocrinology:  Diabetes - Biguanides Failed - 12/06/2023  5:46 PM      Failed - HBA1C is between 0 and 7.9 and within 180 days    Hemoglobin A1C  Date Value Ref Range Status  05/13/2023 6.8 (A) 4.0 - 5.6 % Final   Hgb A1c MFr Bld  Date Value Ref Range Status  12/13/2022 7.5 (H) 4.8 - 5.6 % Final    Comment:             Prediabetes: 5.7 - 6.4          Diabetes: >6.4          Glycemic control for adults with diabetes: <7.0          Failed - B12 Level in normal range and within 720 days    No results found for: VITAMINB12       Passed - Cr in normal range and within 360 days    Creatinine, Ser  Date Value Ref Range Status  12/13/2022 0.62 0.57 - 1.00 mg/dL Final         Passed - eGFR in normal range and within 360 days    GFR calc Af Amer  Date Value Ref Range Status  12/03/2019 91 >59 mL/min/1.73 Final    Comment:    **[facility] currently reports eGFR in compliance with the current**   recommendations of the [employer]. [facility] will   update reporting as new guidelines are published from the NKF-ASN   Task force.    GFR calc non Af Amer  Date Value Ref Range Status  12/03/2019 79 >59 mL/min/1.73 Final   eGFR  Date Value Ref Range Status  12/13/2022 91 >59 mL/min/1.73 Final         Passed - Valid encounter within last 6 months    Recent Outpatient Visits           Today Rotator cuff syndrome, right   [place] [facility], Selinda PARAS, MD   Today Subconjunctival hemorrhage of right eye   [facility] at [facility], Leita DEL, MD   4 weeks ago Rotator cuff syndrome, right   [place] [facility] at [facility], Selinda PARAS, MD   4 weeks ago Acute non-recurrent sinusitis, unspecified location   [facility] at [facility], Leita DEL, MD   2 months ago Neoplasm of uncertain behavior of skin   [place] [facility] at [facility], Leita DEL, MD       Future Appointments             In 1 month Justus Leita DEL, MD [facility] at [facility]            Passed - CBC within normal limits and completed in the last 12 months    WBC  Date Value Ref Range Status  12/13/2022 5.0 3.4 - 10.8 x10E3/uL Final  06/22/2017 9.6 3.6 - 11.0 K/uL Final   RBC  Date Value Ref Range Status  12/13/2022 5.31 (H) 3.77 - 5.28 x10E6/uL Final  01/22/2018  2 (A) 4.04 - 5.48 M/uL Final  06/22/2017 5.33 (H) 3.80 - 5.20 MIL/uL Final   Hemoglobin  Date Value Ref Range Status  12/13/2022 12.5 11.1 - 15.9 g/dL Final   Hematocrit  Date Value Ref Range Status  12/13/2022 38.4 34.0 - 46.6 % Final   MCHC  Date Value Ref Range Status  12/13/2022 32.6 31.5 - 35.7 g/dL Final  97/97/7980 67.0 32.0 - 36.0 g/dL Final   [facility]  Date Value Ref Range Status  12/13/2022 23.5 (L) 26.6 - 33.0 pg Final  06/22/2017 23.6 (L) 26.0 - 34.0 pg Final   MCV  Date Value Ref Range Status  12/13/2022 72 (L) 79 - 97 fL Final   No results found for: PLTCOUNTKUC, LABPLAT, POCPLA RDW  Date Value Ref Range Status  12/13/2022 17.0 (H) 11.7 - 15.4 % Final          lisinopril  (ZESTRIL ) 10 MG tablet [Pharmacy Med Name: Lisinopril  10 MG Oral Tablet] 100 tablet 2    Sig: TAKE 1 TABLET BY MOUTH DAILY     Cardiovascular:  ACE Inhibitors Failed - 12/06/2023  5:46 PM      Failed - Cr in normal range and within 180 days    Creatinine, Ser  Date Value Ref Range Status  12/13/2022 0.62 0.57 - 1.00 mg/dL Final         Failed - K in normal range and within 180 days    Potassium  Date Value Ref Range Status   12/13/2022 4.5 3.5 - 5.2 mmol/L Final         Passed - Patient is not pregnant      Passed - Last BP in normal range    BP Readings from Last 1 Encounters:  12/06/23 128/76         Passed - Valid encounter within last 6 months    Recent Outpatient Visits           Today Rotator cuff syndrome, right   [place] [facility] at [facility], Selinda PARAS, MD   Today Subconjunctival hemorrhage of right eye   [facility] at [facility], Leita DEL, MD   4 weeks ago Rotator cuff syndrome, right   [facility] at [facility], Selinda PARAS, MD   4 weeks ago Acute non-recurrent sinusitis, unspecified location   [facility] at [facility], Leita DEL, MD   2 months ago Neoplasm of uncertain behavior of skin   [place] [facility] at [facility], Leita DEL, MD       Future Appointments             In 1 month Justus Leita DEL, MD [facility] Medicine at [facility], [facility]             alendronate  (FOSAMAX ) 70 MG tablet [Pharmacy Med Name: Alendronate  Sodium 70 MG Oral Tablet] 12 tablet 3    Sig: TAKE 1 TABLET BY MOUTH WEEKLY  WITH A FULL GLASS OF WATER ON AN EMPTY STOMACH     Endocrinology:  Bisphosphonates Failed - 12/06/2023  5:46 PM      Failed - Vitamin D  in normal range and within 360 days    Vit D, 25-Hydroxy  Date Value Ref Range Status  12/11/2021 123.2 (H) 30.0 - 100.0 ng/mL Final    Comment:    **  Results verified by repeat testing** Vitamin D  deficiency has been defined by the Institute of Medicine and an Endocrine Society practice guideline as a level of serum 25-OH vitamin D  less than 20 ng/mL (1,2). The Endocrine Society went on to further define vitamin D  insufficiency as a level between 21 and 29 ng/mL (2). 1. IOM (Institute of Medicine). 2010. Dietary  reference    intakes for calcium  and D. [place]: The    [employer]. 2. Holick MF, Binkley Pocola, Bischoff-Ferrari HA, et al.    Evaluation, treatment, and prevention of vitamin D     deficiency: an Endocrine Society clinical practice    guideline. JCEM. 2011 Jul; 96(7):1911-30.          Failed - Mg Level in normal range and within 360 days    No results found for: MG       Failed - Phosphate in normal range and within 360 days    No results found for: PHOS       Failed - Bone Mineral Density or Dexa Scan completed in the last 2 years      Passed - Ca in normal range and within 360 days    Calcium   Date Value Ref Range Status  12/13/2022 9.1 8.7 - 10.3 mg/dL Final         Passed - Cr in normal range and within 360 days    Creatinine, Ser  Date Value Ref Range Status  12/13/2022 0.62 0.57 - 1.00 mg/dL Final         Passed - eGFR is 30 or above and within 360 days    GFR calc Af Amer  Date Value Ref Range Status  12/03/2019 91 >59 mL/min/1.73 Final    Comment:    **[facility] currently reports eGFR in compliance with the current**   recommendations of the [employer]. [facility] will   update reporting as new guidelines are published from the NKF-ASN   Task force.    GFR calc non Af Amer  Date Value Ref Range Status  12/03/2019 79 >59 mL/min/1.73 Final   eGFR  Date Value Ref Range Status  12/13/2022 91 >59 mL/min/1.73 Final         Passed - Valid encounter within last 12 months    Recent Outpatient Visits           Today Rotator cuff syndrome, right   [place] [facility], Selinda PARAS, MD   Today Subconjunctival hemorrhage of right eye   [facility] at [facility], Leita DEL, MD   4 weeks ago Rotator cuff syndrome, right   [facility] at [facility], Selinda PARAS, MD   4 weeks ago Acute non-recurrent  sinusitis, unspecified location   [facility] at [facility], Leita DEL, MD   2 months ago Neoplasm of uncertain behavior of skin   [place] [facility] at [facility], Leita DEL, MD       Future Appointments             In 1 month Justus, Leita DEL, MD [facility] Medicine at [facility], [facility]

## 2023-12-06 NOTE — Progress Notes (Signed)
 Date:  12/06/2023   Name:  Sharon York   DOB:  [birth year]   MRN:  [id number]   Chief Complaint: [place] Amon Tues. Morning- 3 days ago and hit left side of body against night stand. Stroke has affected left side so patient can't feel anything. Has a large bruise on left side. Does not remember how she fell. )  HPI  Review of Systems  Constitutional:  Negative for appetite change, fatigue and fever.  Respiratory:  Negative for cough, chest tightness, shortness of breath and wheezing.   Cardiovascular:  Negative for chest pain and palpitations.  Musculoskeletal:  Negative for arthralgias.  Neurological:  Negative for dizziness and headaches.  Psychiatric/Behavioral:  Negative for dysphoric mood and sleep disturbance. The patient is not nervous/anxious.      Lab Results  Component Value Date   NA 142 12/13/2022   K 4.5 12/13/2022   CO2 27 12/13/2022   GLUCOSE 116 (H) 12/13/2022   BUN 11 12/13/2022   CREATININE 0.62 12/13/2022   CALCIUM  9.1 12/13/2022   EGFR 91 12/13/2022   GFRNONAA 79 12/03/2019   Lab Results  Component Value Date   CHOL 169 12/13/2022   HDL 70 12/13/2022   LDLCALC 83 12/13/2022   TRIG 90 12/13/2022   CHOLHDL 2.4 12/13/2022   Lab Results  Component Value Date   TSH 3.130 12/13/2022   Lab Results  Component Value Date   HGBA1C 6.8 (A) 05/13/2023   Lab Results  Component Value Date   WBC 5.0 12/13/2022   HGB 12.5 12/13/2022   HCT 38.4 12/13/2022   MCV 72 (L) 12/13/2022   PLT 271 12/13/2022   Lab Results  Component Value Date   ALT 16 12/13/2022   AST 17 12/13/2022   ALKPHOS 47 12/13/2022   BILITOT 0.3 12/13/2022   Lab Results  Component Value Date   VD25OH 123.2 (H) 12/11/2021     Patient Active Problem List   Diagnosis Date Noted   Rotator cuff syndrome, right 11/08/2023   Fever blister 10/02/2023   Trigger finger, right middle finger 12/11/2021   Microalbuminuria due to type 2 diabetes mellitus (HCC) 12/07/2019   Diabetes  mellitus treated with oral medication (HCC) 04/21/2018   Right shoulder pain 03/31/2018   Hyperlipidemia associated with type 2 diabetes mellitus (HCC) 06/12/2017   Tinea corporis 03/17/2015   Abnormal gait 12/15/2014   Altered bowel function 12/15/2014   Hemiparesis affecting left side as late effect of cerebrovascular accident (CVA) (HCC) 12/15/2014   Obstructive sleep apnea of adult 12/15/2014   Arthritis of shoulder region, 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     Past Surgical History:  Procedure Laterality Date   CESAREAN SECTION  1975   COLONOSCOPY  2010   normal    Social History   Tobacco Use   Smoking status: Never    Passive exposure: Never   Smokeless tobacco: Never   Tobacco comments:    smoking cessation materials not required  Vaping Use   Vaping status: Never Used  Substance Use Topics   Alcohol use: No    Alcohol/week: 0.0 standard drinks of alcohol   Drug use: Never     Medication list  has been reviewed and updated.  Current Meds  Medication Sig   ACCU-CHEK GUIDE TEST test strip USE TO CHECK BLOOD GLUCOSE AS  INTRUCTED   Accu-Chek Softclix Lancets lancets USE TO CHECK BLOOD GLUCOSE UP TO 4 TIMES DAILY   acyclovir  cream (ZOVIRAX ) 5 % Apply 1 Application topically every 3 (three) hours. To lip lesion prn   alendronate  (FOSAMAX ) 70 MG tablet TAKE 1 TABLET BY MOUTH ONCE A WEEK WITH A FULL GLASS OF WATER ON AN EMPTY STOMACH   aspirin 81 MG chewable tablet Chew 1 tablet by mouth daily.   atorvastatin  (LIPITOR) 10 MG tablet TAKE 1 TABLET BY MOUTH DAILY   blood glucose meter kit and supplies Dispense based on patient and insurance preference. Use up to four times daily as  directed. (FOR ICD-10 E10.9, E11.9).   ciclopirox (PENLAC) 8 % solution Apply topically at bedtime.   clotrimazole -betamethasone  (LOTRISONE ) cream Apply 1 Application topically 2 (two) times daily. To groin rash   Continuous Glucose Sensor (DEXCOM G7 SENSOR) MISC 1 each by Does not apply route continuous.   lisinopril  (ZESTRIL ) 10 MG tablet TAKE 1 TABLET BY MOUTH DAILY   Melatonin 10 MG TABS Take 1 tablet by mouth at bedtime as needed.   metFORMIN  (GLUCOPHAGE ) 500 MG tablet Take 1 tablet (500 mg total) by mouth daily with breakfast.   Multiple Vitamin (MULTI-VITAMIN) tablet Take 1 tablet by mouth daily.   nystatin  cream (MYCOSTATIN ) Apply 1 Application topically 2 (two) times daily.   triamcinolone  (KENALOG ) 0.025 % ointment Apply 1 Application topically 2 (two) times daily.   [DISCONTINUED] baclofen  (LIORESAL ) 10 MG tablet Take 1 tablet (10 mg total) by mouth 2 (two) times daily.       12/06/2023    9:10 AM 10/02/2023    9:15 AM 08/13/2023    3:41 PM 05/13/2023   10:02 AM  GAD 7 : Generalized Anxiety Score  Nervous, Anxious, on Edge 0 0 0 0  Control/stop worrying 0 0 0 0  Worry too much - different things 0  0 0  Trouble relaxing 0  0 0  Restless 0  0 0  Easily annoyed or irritable 0  0 0  Afraid - awful might happen 0  0 0  Total GAD 7 Score 0  0 0  Anxiety Difficulty Not difficult at all  Not difficult at all Not difficult at all       12/06/2023    9:10 AM 10/02/2023    9:15 AM 08/13/2023    3:41 PM  Depression screen PHQ 2/9  Decreased Interest 0 0 0  Down, Depressed, Hopeless 0 0 0  PHQ - 2 Score 0 0 0  Altered sleeping 0  0  Tired, decreased energy 0  1  Change in appetite 0  0  Feeling bad or failure about yourself  0  0  Trouble concentrating 0  0  Moving slowly or fidgety/restless 0  0  Suicidal thoughts 0  0  PHQ-9 Score 0  1  Difficult doing work/chores Not difficult at all  Not difficult at all    BP Readings from Last 3 Encounters:  12/06/23 128/76   12/06/23 124/68  11/08/23 132/74    Physical Exam Eyes:     Conjunctiva/sclera:     Right eye: Hemorrhage present.   Cardiovascular:     Rate and Rhythm: Normal rate and regular rhythm.  Pulmonary:     Effort: No respiratory distress.     Breath sounds: No  wheezing or rhonchi.  Musculoskeletal:     Cervical back: Normal range of motion.  Lymphadenopathy:     Cervical: No cervical adenopathy.  Skin:        Comments: Bruise and superficial abrasion  Neurological:     Mental Status: She is alert.     Wt Readings from Last 3 Encounters:  12/06/23 149 lb (67.6 kg)  12/06/23 149 lb (67.6 kg)  11/08/23 155 lb (70.3 kg)    BP 124/68   Pulse 84   Ht 4' 9 (1.448 m)   Wt 149 lb (67.6 kg)   SpO2 98%   BMI 32.24 kg/m   Assessment and Plan:  Problem List Items Addressed This Visit       Unprioritized   Hemiparesis affecting left side as late effect of cerebrovascular accident (CVA) (HCC) (Chronic)   Relevant Medications   baclofen  (LIORESAL ) 10 MG tablet   Diabetes mellitus treated with oral medication (HCC) (Chronic)   Relevant Orders   AMB Referral [facility] Care Management   Other Visit Diagnoses       Subconjunctival hemorrhage of right eye    -  Primary   seen by ophthalmology     Contusion of left back wall of thorax, initial encounter       moderate bruising but no pain to palpation or with deep inspiration patient reassured -       No follow-ups on file.    Sharon HILARIO Adie, MD [facility] [place]

## 2023-12-06 NOTE — Progress Notes (Signed)
     Primary Care / Sports Medicine Office Visit  Patient Information:  Patient ID: Sharon York, female DOB: [birth year] Age: 78 y.o. MRN: [id number]   Sharon York is a pleasant 78 y.o. female presenting with the following:  Chief Complaint  Patient presents with   Shoulder Pain    Patient following up from injection she had in right shoulder on 11/08/23. She states the injection helped a little but she is still having difficulty with ROM.    Vitals:   12/06/23 1013  BP: 128/76  Pulse: 80  SpO2: 95%   Vitals:   12/06/23 1013  Weight: 149 lb (67.6 kg)  Height: 4' 9 (1.448 m)   Body mass index is 32.24 kg/m.    Independent interpretation of notes and tests performed by another provider:   None  Procedures performed:   None  Pertinent History, Exam, Impression, and Recommendations:   Problem List Items Addressed This Visit     Right shoulder pain   Relevant Orders   Ambulatory referral to Home Health   Rotator cuff syndrome, right - Primary   History of Present Illness Sharon York is an 78 year old female who presents with right shoulder stiffness following a prior injection.  Right shoulder stiffness - Reported significant stiffness in the right shoulder since last visit on June 20th, 2025 - Stiffness limits range of motion and functional use of the right shoulder - No current pain in the right shoulder following prior injection  Physical Exam INSPECTION: Normal musculoskeletal exam except for right shoulder findings. RANGE OF MOTION: Right shoulder external rotation 45 degrees, painless. Right shoulder abduction with 20 degrees of deficit. Right shoulder forward flexion with 10-15 degrees of deficit.  Motions are painless.  RC testing improved.  Assessment and Plan Right rotator cuff syndrome  Significant improvement in symptomatology and function noted today.  Patient reports being apprehensive to try and use her shoulder prior to this visit.   Reassurance provided given her excellent interim objective improvement.  Risk of frozen shoulder if not used regularly. - Encouraged regular shoulder use in daily activities to prevent stiffness and frozen shoulder. - Referred to home health for physical therapy, 1-2 times a week for 6-8 weeks, focusing on the right shoulder. - Ensure home health contacts her at [phone].  Shoulder pain No current pain. - Consider repeating corticosteroid injection if pain returns by September or October. - Use ice for 20 minutes as needed for pain and inflammation. - Use Voltaren gel up to four times a day if needed.      Relevant Orders   Ambulatory referral to Home Health     Orders & Medications Medications: No orders of the defined types were placed in this encounter.  Orders Placed This Encounter  Procedures   Ambulatory referral to Home Health     No follow-ups on file.     Sharon JINNY Ku, MD, [facility]   Primary Care Sports Medicine Primary Care and Sports Medicine at [facility]

## 2023-12-10 ENCOUNTER — Telehealth: Payer: Self-pay | Admitting: Internal Medicine

## 2023-12-10 NOTE — Progress Notes (Signed)
 [facility] Quality Team Note  Name: Sharon York Date of Birth: 07-23-1945 MRN: [id number] Date: 12/10/2023  [facility] Quality Team has reviewed this patient's chart, please see recommendations below:  [facility] Quality Other; (Chart reviewed for KED measure. Would it be possible to have egfr and urine microalbumin/creatinine ratio completed at upcoming office visit 01/10/2024?)

## 2023-12-10 NOTE — Telephone Encounter (Signed)
 Copied from CRM [id number]. Topic: General - Other >> Dec 09, 2023  5:19 PM Kevelyn M wrote: Reason for CRM: [facility] with [facility] called stated that the patient's insurance is out of network.

## 2023-12-17 ENCOUNTER — Encounter: Payer: Self-pay | Admitting: Internal Medicine

## 2023-12-20 ENCOUNTER — Other Ambulatory Visit (HOSPITAL_COMMUNITY): Payer: Self-pay

## 2023-12-20 DIAGNOSIS — Z7983 Long term (current) use of bisphosphonates: Secondary | ICD-10-CM | POA: Diagnosis not present

## 2023-12-20 DIAGNOSIS — I69354 Hemiplegia and hemiparesis following cerebral infarction affecting left non-dominant side: Secondary | ICD-10-CM | POA: Diagnosis not present

## 2023-12-20 DIAGNOSIS — Z9181 History of falling: Secondary | ICD-10-CM | POA: Diagnosis not present

## 2023-12-20 DIAGNOSIS — M75101 Unspecified rotator cuff tear or rupture of right shoulder, not specified as traumatic: Secondary | ICD-10-CM | POA: Diagnosis not present

## 2023-12-20 DIAGNOSIS — I10 Essential (primary) hypertension: Secondary | ICD-10-CM | POA: Diagnosis not present

## 2023-12-20 DIAGNOSIS — G8929 Other chronic pain: Secondary | ICD-10-CM | POA: Diagnosis not present

## 2023-12-20 DIAGNOSIS — E119 Type 2 diabetes mellitus without complications: Secondary | ICD-10-CM | POA: Diagnosis not present

## 2023-12-20 DIAGNOSIS — Z7982 Long term (current) use of aspirin: Secondary | ICD-10-CM | POA: Diagnosis not present

## 2023-12-23 ENCOUNTER — Other Ambulatory Visit (HOSPITAL_COMMUNITY): Payer: Self-pay

## 2023-12-23 DIAGNOSIS — M79675 Pain in left toe(s): Secondary | ICD-10-CM | POA: Diagnosis not present

## 2023-12-23 DIAGNOSIS — B351 Tinea unguium: Secondary | ICD-10-CM | POA: Diagnosis not present

## 2023-12-23 DIAGNOSIS — E119 Type 2 diabetes mellitus without complications: Secondary | ICD-10-CM | POA: Diagnosis not present

## 2023-12-23 DIAGNOSIS — M79674 Pain in right toe(s): Secondary | ICD-10-CM | POA: Diagnosis not present

## 2023-12-23 DIAGNOSIS — L600 Ingrowing nail: Secondary | ICD-10-CM | POA: Diagnosis not present

## 2023-12-23 LAB — HM DIABETES FOOT EXAM: HM Diabetic Foot Exam: NORMAL

## 2023-12-24 DIAGNOSIS — M75101 Unspecified rotator cuff tear or rupture of right shoulder, not specified as traumatic: Secondary | ICD-10-CM | POA: Diagnosis not present

## 2023-12-24 DIAGNOSIS — Z9181 History of falling: Secondary | ICD-10-CM | POA: Diagnosis not present

## 2023-12-24 DIAGNOSIS — E119 Type 2 diabetes mellitus without complications: Secondary | ICD-10-CM | POA: Diagnosis not present

## 2023-12-24 DIAGNOSIS — G8929 Other chronic pain: Secondary | ICD-10-CM | POA: Diagnosis not present

## 2023-12-24 DIAGNOSIS — I10 Essential (primary) hypertension: Secondary | ICD-10-CM | POA: Diagnosis not present

## 2023-12-24 DIAGNOSIS — Z7983 Long term (current) use of bisphosphonates: Secondary | ICD-10-CM | POA: Diagnosis not present

## 2023-12-24 DIAGNOSIS — Z7982 Long term (current) use of aspirin: Secondary | ICD-10-CM | POA: Diagnosis not present

## 2023-12-24 DIAGNOSIS — I69354 Hemiplegia and hemiparesis following cerebral infarction affecting left non-dominant side: Secondary | ICD-10-CM | POA: Diagnosis not present

## 2023-12-25 ENCOUNTER — Telehealth: Payer: Self-pay

## 2023-12-25 NOTE — Telephone Encounter (Signed)
 Left message on machine for a return call at [phone].  JM   Copied from CRM [id number]. Topic: Clinical - Home Health Verbal Orders >> Dec 25, 2023  8:42 AM Avram MATSU wrote: Caller/Agency: [facility] Callback Number: [phone] Service Requested: OT to improve independence with ADL & Upper extremity strengthening  Frequency: n/a Any new concerns about the patient? No   ----------------------------------------------------------------------- From previous Reason for Contact - Other: Reason for CRM: Sharon York is calling from [facility] stated the pt does not need PT anymore and instead will like to set her up with OT to improved independence with ADL

## 2023-12-25 NOTE — Telephone Encounter (Signed)
 Spoke with [facility] and gave Verbal OT orders for strengthening and ADL. Once OT comes out and evaluates they will call or send order for frequency and time frame per [facility].  JM  Copied from CRM #[id number]. Topic: Clinical - Medical Advice >> Dec 25, 2023 12:33 PM Harlene ORN wrote: Reason for CRM: called pt an hour ago requested verbal orders for  OT to address the pt's needs the practice left a vm asking the rep from home health to call back.  [facility] Phone: [phone]

## 2023-12-26 ENCOUNTER — Other Ambulatory Visit: Payer: Self-pay | Admitting: Internal Medicine

## 2023-12-26 DIAGNOSIS — E118 Type 2 diabetes mellitus with unspecified complications: Secondary | ICD-10-CM

## 2023-12-30 NOTE — Telephone Encounter (Signed)
 Appointment 01/10/24 Rx 02/01/24 #90 [id number]- too soon Requested Prescriptions  Pending Prescriptions Disp Refills   metFORMIN  (GLUCOPHAGE ) 500 MG tablet [Pharmacy Med Name: metFORMIN  HCl 500 MG Oral Tablet] 60 tablet 5    Sig: TAKE 1 TABLET BY MOUTH DAILY  WITH BREAKFAST     Endocrinology:  Diabetes - Biguanides Failed - 12/30/2023  4:24 PM      Failed - Cr in normal range and within 360 days    Creatinine, Ser  Date Value Ref Range Status  12/13/2022 0.62 0.57 - 1.00 mg/dL Final         Failed - HBA1C is between 0 and 7.9 and within 180 days    Hemoglobin A1C  Date Value Ref Range Status  05/13/2023 6.8 (A) 4.0 - 5.6 % Final   Hgb A1c MFr Bld  Date Value Ref Range Status  12/13/2022 7.5 (H) 4.8 - 5.6 % Final    Comment:             Prediabetes: 5.7 - 6.4          Diabetes: >6.4          Glycemic control for adults with diabetes: <7.0          Failed - eGFR in normal range and within 360 days    GFR calc Af Amer  Date Value Ref Range Status  12/03/2019 91 >59 mL/min/1.73 Final    Comment:    **[facility] currently reports eGFR in compliance with the current**   recommendations of the [employer]. [facility] will   update reporting as new guidelines are published from the NKF-ASN   Task force.    GFR calc non Af Amer  Date Value Ref Range Status  12/03/2019 79 >59 mL/min/1.73 Final   eGFR  Date Value Ref Range Status  12/13/2022 91 >59 mL/min/1.73 Final         Failed - B12 Level in normal range and within 720 days    No results found for: VITAMINB12       Failed - CBC within normal limits and completed in the last 12 months    WBC  Date Value Ref Range Status  12/13/2022 5.0 3.4 - 10.8 x10E3/uL Final  06/22/2017 9.6 3.6 - 11.0 K/uL Final   RBC  Date Value Ref Range Status  12/13/2022 5.31 (H) 3.77 - 5.28 x10E6/uL Final  01/22/2018 2 (A) 4.04 - 5.48 M/uL Final  06/22/2017 5.33 (H) 3.80 - 5.20 MIL/uL Final   Hemoglobin  Date Value Ref Range Status   12/13/2022 12.5 11.1 - 15.9 g/dL Final   Hematocrit  Date Value Ref Range Status  12/13/2022 38.4 34.0 - 46.6 % Final   MCHC  Date Value Ref Range Status  12/13/2022 32.6 31.5 - 35.7 g/dL Final  97/97/7980 67.0 32.0 - 36.0 g/dL Final   [facility]  Date Value Ref Range Status  12/13/2022 23.5 (L) 26.6 - 33.0 pg Final  06/22/2017 23.6 (L) 26.0 - 34.0 pg Final   MCV  Date Value Ref Range Status  12/13/2022 72 (L) 79 - 97 fL Final   No results found for: PLTCOUNTKUC, LABPLAT, POCPLA RDW  Date Value Ref Range Status  12/13/2022 17.0 (H) 11.7 - 15.4 % Final         Passed - Valid encounter within last 6 months    Recent Outpatient Visits           3 weeks ago Rotator cuff syndrome, right   [place]  Primary Care & Sports Medicine at [facility] Alvia, Selinda PARAS, MD   3 weeks ago Subconjunctival hemorrhage of right eye   [place] Primary Care & Sports Medicine at [facility], Leita DEL, MD   1 month ago Rotator cuff syndrome, right   [facility] Primary Care & Sports Medicine at [facility], Selinda PARAS, MD   1 month ago Acute non-recurrent sinusitis, unspecified location   [facility] Primary Care & Sports Medicine at [facility], Leita DEL, MD   2 months ago Neoplasm of uncertain behavior of skin   [place] Primary Care & Sports Medicine at [facility], Leita DEL, MD       Future Appointments             In 1 week Justus, Leita DEL, MD [facility] Primary Care & Sports Medicine at [facility], [facility]

## 2023-12-31 ENCOUNTER — Other Ambulatory Visit (HOSPITAL_COMMUNITY): Payer: Self-pay

## 2023-12-31 ENCOUNTER — Telehealth: Payer: Self-pay | Admitting: Pharmacy Technician

## 2023-12-31 NOTE — Telephone Encounter (Signed)
 Pharmacy Patient Advocate Encounter   Received notification from RX Request Messages that prior authorization for [facility] G7 SENSOR is required/requested.   Insurance verification completed.   The patient is insured through [facility] .   Per test claim: PA required; PA submitted to above mentioned insurance via Latent Key/confirmation #/EOC [id number] Status is pending

## 2024-01-02 ENCOUNTER — Other Ambulatory Visit: Payer: Self-pay | Admitting: Internal Medicine

## 2024-01-02 DIAGNOSIS — M810 Age-related osteoporosis without current pathological fracture: Secondary | ICD-10-CM

## 2024-01-06 NOTE — Telephone Encounter (Signed)
 Requested medication (s) are due for refill today: yes  Requested medication (s) are on the active medication list: yes  Last refill:  02/01/23  Future visit scheduled: yes  Notes to clinic:  Unable to refill per protocol due to failed labs, no updated results.      Requested Prescriptions  Pending Prescriptions Disp Refills   alendronate  (FOSAMAX ) 70 MG tablet [Pharmacy Med Name: Alendronate  Sodium 70 MG Oral Tablet] 12 tablet 3    Sig: TAKE 1 TABLET BY MOUTH WEEKLY  WITH A FULL GLASS OF WATER ON AN EMPTY STOMACH     Endocrinology:  Bisphosphonates Failed - 01/06/2024  1:37 PM      Failed - Ca in normal range and within 360 days    Calcium   Date Value Ref Range Status  12/13/2022 9.1 8.7 - 10.3 mg/dL Final         Failed - Vitamin D  in normal range and within 360 days    Vit D, 25-Hydroxy  Date Value Ref Range Status  12/11/2021 123.2 (H) 30.0 - 100.0 ng/mL Final    Comment:    **Results verified by repeat testing** Vitamin D  deficiency has been defined by the Institute of Medicine and an Endocrine Society practice guideline as a level of serum 25-OH vitamin D  less than 20 ng/mL (1,2). The Endocrine Society went on to further define vitamin D  insufficiency as a level between 21 and 29 ng/mL (2). 1. IOM (Institute of Medicine). 2010. Dietary reference    intakes for calcium  and D. [place]: The    [employer]. 2. Holick MF, Binkley [place], Bischoff-Ferrari HA, et al.    Evaluation, treatment, and prevention of vitamin D     deficiency: an Endocrine Society clinical practice    guideline. JCEM. 2011 Jul; 96(7):1911-30.          Failed - Cr in normal range and within 360 days    Creatinine, Ser  Date Value Ref Range Status  12/13/2022 0.62 0.57 - 1.00 mg/dL Final         Failed - Mg Level in normal range and within 360 days    No results found for: MG       Failed - Phosphate in normal range and within 360 days    No results found for: PHOS        Failed - eGFR is 30 or above and within 360 days    GFR calc Af Amer  Date Value Ref Range Status  12/03/2019 91 >59 mL/min/1.73 Final    Comment:    **[facility] currently reports eGFR in compliance with the current**   recommendations of the [employer]. [facility] will   update reporting as new guidelines are published from the NKF-ASN   Task force.    GFR calc non Af Amer  Date Value Ref Range Status  12/03/2019 79 >59 mL/min/1.73 Final   eGFR  Date Value Ref Range Status  12/13/2022 91 >59 mL/min/1.73 Final         Failed - Bone Mineral Density or Dexa Scan completed in the last 2 years      Passed - Valid encounter within last 12 months    Recent Outpatient Visits           1 month ago Rotator cuff syndrome, right   [place] Primary Care & Sports Medicine at [facility], Selinda PARAS, MD   1 month ago Subconjunctival hemorrhage of right eye   [place] Primary  Care & Sports Medicine at [facility], Leita DEL, MD   1 month ago Rotator cuff syndrome, right   [facility] Primary Care & Sports Medicine at [facility], Selinda PARAS, MD   2 months ago Acute non-recurrent sinusitis, unspecified location   [facility] Primary Care & Sports Medicine at [facility], Leita DEL, MD   3 months ago Neoplasm of uncertain behavior of skin   [place] Primary Care & Sports Medicine at [facility], Leita DEL, MD       Future Appointments             In 4 days Justus Leita DEL, MD [facility] Primary Care & Sports Medicine at [facility], [facility]

## 2024-01-06 NOTE — Telephone Encounter (Signed)
 Pharmacy Patient Advocate Encounter  Received notification from [employer] that Prior Authorization for [facility] G7 SENSOR  has been DENIED.  Full denial letter will be uploaded to the media tab. See denial reason below.     PA #/Case ID/Reference #: [id number]

## 2024-01-08 DIAGNOSIS — M6281 Muscle weakness (generalized): Secondary | ICD-10-CM | POA: Diagnosis not present

## 2024-01-08 DIAGNOSIS — Z7983 Long term (current) use of bisphosphonates: Secondary | ICD-10-CM | POA: Diagnosis not present

## 2024-01-08 DIAGNOSIS — Z9181 History of falling: Secondary | ICD-10-CM | POA: Diagnosis not present

## 2024-01-08 DIAGNOSIS — I10 Essential (primary) hypertension: Secondary | ICD-10-CM | POA: Diagnosis not present

## 2024-01-08 DIAGNOSIS — G8929 Other chronic pain: Secondary | ICD-10-CM | POA: Diagnosis not present

## 2024-01-08 DIAGNOSIS — M1990 Unspecified osteoarthritis, unspecified site: Secondary | ICD-10-CM | POA: Diagnosis not present

## 2024-01-08 DIAGNOSIS — M75101 Unspecified rotator cuff tear or rupture of right shoulder, not specified as traumatic: Secondary | ICD-10-CM | POA: Diagnosis not present

## 2024-01-08 DIAGNOSIS — I69354 Hemiplegia and hemiparesis following cerebral infarction affecting left non-dominant side: Secondary | ICD-10-CM | POA: Diagnosis not present

## 2024-01-08 DIAGNOSIS — Z7982 Long term (current) use of aspirin: Secondary | ICD-10-CM | POA: Diagnosis not present

## 2024-01-08 DIAGNOSIS — E139 Other specified diabetes mellitus without complications: Secondary | ICD-10-CM | POA: Diagnosis not present

## 2024-01-08 DIAGNOSIS — E119 Type 2 diabetes mellitus without complications: Secondary | ICD-10-CM | POA: Diagnosis not present

## 2024-01-09 DIAGNOSIS — M75101 Unspecified rotator cuff tear or rupture of right shoulder, not specified as traumatic: Secondary | ICD-10-CM | POA: Diagnosis not present

## 2024-01-09 DIAGNOSIS — Z9181 History of falling: Secondary | ICD-10-CM | POA: Diagnosis not present

## 2024-01-09 DIAGNOSIS — Z7983 Long term (current) use of bisphosphonates: Secondary | ICD-10-CM | POA: Diagnosis not present

## 2024-01-09 DIAGNOSIS — E119 Type 2 diabetes mellitus without complications: Secondary | ICD-10-CM | POA: Diagnosis not present

## 2024-01-09 DIAGNOSIS — M1990 Unspecified osteoarthritis, unspecified site: Secondary | ICD-10-CM | POA: Diagnosis not present

## 2024-01-09 DIAGNOSIS — G8929 Other chronic pain: Secondary | ICD-10-CM | POA: Diagnosis not present

## 2024-01-09 DIAGNOSIS — Z7982 Long term (current) use of aspirin: Secondary | ICD-10-CM | POA: Diagnosis not present

## 2024-01-09 DIAGNOSIS — M6281 Muscle weakness (generalized): Secondary | ICD-10-CM | POA: Diagnosis not present

## 2024-01-09 DIAGNOSIS — E139 Other specified diabetes mellitus without complications: Secondary | ICD-10-CM | POA: Diagnosis not present

## 2024-01-09 DIAGNOSIS — I10 Essential (primary) hypertension: Secondary | ICD-10-CM | POA: Diagnosis not present

## 2024-01-09 DIAGNOSIS — I69354 Hemiplegia and hemiparesis following cerebral infarction affecting left non-dominant side: Secondary | ICD-10-CM | POA: Diagnosis not present

## 2024-01-10 ENCOUNTER — Encounter: Payer: Self-pay | Admitting: Internal Medicine

## 2024-01-10 ENCOUNTER — Ambulatory Visit (INDEPENDENT_AMBULATORY_CARE_PROVIDER_SITE_OTHER): Admitting: Internal Medicine

## 2024-01-10 ENCOUNTER — Telehealth: Payer: Self-pay

## 2024-01-10 VITALS — BP 122/74 | HR 87 | Ht <= 58 in | Wt 152.0 lb

## 2024-01-10 DIAGNOSIS — Z0000 Encounter for general adult medical examination without abnormal findings: Secondary | ICD-10-CM

## 2024-01-10 DIAGNOSIS — E1169 Type 2 diabetes mellitus with other specified complication: Secondary | ICD-10-CM | POA: Diagnosis not present

## 2024-01-10 DIAGNOSIS — I10 Essential (primary) hypertension: Secondary | ICD-10-CM | POA: Diagnosis not present

## 2024-01-10 DIAGNOSIS — I69354 Hemiplegia and hemiparesis following cerebral infarction affecting left non-dominant side: Secondary | ICD-10-CM

## 2024-01-10 DIAGNOSIS — M810 Age-related osteoporosis without current pathological fracture: Secondary | ICD-10-CM

## 2024-01-10 DIAGNOSIS — E118 Type 2 diabetes mellitus with unspecified complications: Secondary | ICD-10-CM | POA: Diagnosis not present

## 2024-01-10 DIAGNOSIS — E785 Hyperlipidemia, unspecified: Secondary | ICD-10-CM

## 2024-01-10 DIAGNOSIS — Z1231 Encounter for screening mammogram for malignant neoplasm of breast: Secondary | ICD-10-CM

## 2024-01-10 DIAGNOSIS — E1129 Type 2 diabetes mellitus with other diabetic kidney complication: Secondary | ICD-10-CM

## 2024-01-10 DIAGNOSIS — Z7984 Long term (current) use of oral hypoglycemic drugs: Secondary | ICD-10-CM

## 2024-01-10 DIAGNOSIS — E119 Type 2 diabetes mellitus without complications: Secondary | ICD-10-CM | POA: Diagnosis not present

## 2024-01-10 DIAGNOSIS — R809 Proteinuria, unspecified: Secondary | ICD-10-CM | POA: Diagnosis not present

## 2024-01-10 MED ORDER — ATORVASTATIN CALCIUM 10 MG PO TABS: 10.0000 mg | ORAL_TABLET | Freq: Every day | ORAL | 1 refills | Status: DC

## 2024-01-10 MED ORDER — METFORMIN HCL 500 MG PO TABS: 500.0000 mg | ORAL_TABLET | Freq: Every day | ORAL | 1 refills | Status: DC

## 2024-01-10 MED ORDER — LISINOPRIL 10 MG PO TABS: 10.0000 mg | ORAL_TABLET | Freq: Every day | ORAL | 1 refills | Status: DC

## 2024-01-10 NOTE — Progress Notes (Addendum)
 Date:  01/10/2024   Name:  Sharon York   DOB:  1945/06/05   MRN:  [id number]   Chief Complaint: Annual Exam Sharon York is a 78 y.o. female who presents today for her Complete Annual Exam. She feels fairly well. She reports exercising walking. She reports she is sleeping fairly well. Breast complaints none.  Health Maintenance  Topic Date Due   COVID-19 Vaccine (6 - Pfizer risk 2024-25 season) 02/13/2024   Breast Cancer Screening  03/17/2024   Flu Shot  08/18/2024*   Hemoglobin A1C  07/12/2024   Medicare Annual Wellness Visit  08/07/2024   Eye exam for diabetics  08/18/2024   Complete foot exam   12/22/2024   Yearly kidney function blood test for diabetes  01/09/2025   Yearly kidney health urinalysis for diabetes  01/09/2025   DEXA scan (bone density measurement)  08/04/2025   DTaP/Tdap/Td vaccine (2 - Td or Tdap) 07/10/2028   Pneumococcal Vaccine for age over 60  Completed   Zoster (Shingles) Vaccine  Completed   HPV Vaccine  Aged Out   Meningitis B Vaccine  Aged Out   Colon Cancer Screening  Discontinued   Hepatitis C Screening  Discontinued  *Topic was postponed. The date shown is not the original due date.    Diabetes She presents for her follow-up diabetic visit. She has type 2 diabetes mellitus. Her disease course has been stable. Pertinent negatives for hypoglycemia include no dizziness, headaches or nervousness/anxiousness. Associated symptoms include weakness. Pertinent negatives for diabetes include no chest pain and no fatigue. Current diabetic treatment includes oral agent (monotherapy) (MTF). She is compliant with treatment all of the time. An ACE inhibitor/angiotensin II receptor blocker is being taken. She sees a podiatrist. Hyperlipidemia This is a chronic problem. Pertinent negatives include no chest pain, myalgias or shortness of breath. Current antihyperlipidemic treatment includes statins. The current treatment provides significant improvement of  lipids.    Review of Systems  Constitutional:  Negative for fatigue and unexpected weight change.  HENT:  Negative for trouble swallowing.   Eyes:  Negative for visual disturbance.  Respiratory:  Negative for cough, chest tightness, shortness of breath and wheezing.   Cardiovascular:  Negative for chest pain, palpitations and leg swelling.  Gastrointestinal:  Negative for abdominal pain, constipation and diarrhea.  Genitourinary:  Negative for dysuria.  Musculoskeletal:  Positive for gait problem. Negative for arthralgias and myalgias.  Skin:  Negative for color change and rash.  Neurological:  Positive for weakness. Negative for dizziness, light-headedness and headaches.  Psychiatric/Behavioral:  Negative for dysphoric mood and sleep disturbance. The patient is not nervous/anxious.      Lab Results  Component Value Date   NA 143 01/10/2024   K 4.4 01/10/2024   CO2 24 01/10/2024   GLUCOSE 83 01/10/2024   BUN 10 01/10/2024   CREATININE 0.61 01/10/2024   CALCIUM  9.5 01/10/2024   EGFR 90 01/10/2024   GFRNONAA 79 12/03/2019   Lab Results  Component Value Date   CHOL 175 01/10/2024   HDL 74 01/10/2024   LDLCALC 82 01/10/2024   TRIG 106 01/10/2024   CHOLHDL 2.4 01/10/2024   Lab Results  Component Value Date   TSH 2.920 01/10/2024   Lab Results  Component Value Date   HGBA1C 7.1 (H) 01/10/2024   Lab Results  Component Value Date   WBC 6.8 01/10/2024   HGB 12.4 01/10/2024   HCT 40.5 01/10/2024   MCV 79 01/10/2024   PLT 270 01/10/2024  Lab Results  Component Value Date   ALT 19 01/10/2024   AST 22 01/10/2024   ALKPHOS 53 01/10/2024   BILITOT <0.2 01/10/2024   Lab Results  Component Value Date   VD25OH 35.5 01/10/2024     Patient Active Problem List   Diagnosis Date Noted   Rotator cuff syndrome, right 11/08/2023   Fever blister 10/02/2023   Trigger finger, right middle finger 12/11/2021   Microalbuminuria due to type 2 diabetes mellitus (HCC) 12/07/2019    Diabetes mellitus treated with oral medication (HCC) 04/21/2018   Right shoulder pain 03/31/2018   Hyperlipidemia associated with type 2 diabetes mellitus (HCC) 06/12/2017   Tinea corporis 03/17/2015   Altered bowel function 12/15/2014   Hemiparesis affecting left side as late effect of cerebrovascular accident (CVA) (HCC) 12/15/2014   Obstructive sleep apnea of adult 12/15/2014   Arthritis of shoulder region, 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     Past Surgical History:  Procedure Laterality Date   CESAREAN SECTION  1975   COLONOSCOPY  2010   normal    Social History   Tobacco Use   Smoking status: Never    Passive exposure: Never   Smokeless tobacco: Never   Tobacco comments:    smoking cessation materials not required  Vaping Use   Vaping status: Never Used  Substance Use Topics   Alcohol use: No    Alcohol/week: 0.0 standard drinks of alcohol   Drug use: Never     Medication list has been reviewed and updated.  Current Meds  Medication Sig   ACCU-CHEK GUIDE TEST test strip USE TO CHECK BLOOD GLUCOSE AS  INTRUCTED   Accu-Chek Softclix Lancets lancets USE TO CHECK BLOOD GLUCOSE UP TO 4 TIMES DAILY   acyclovir  cream (ZOVIRAX ) 5 % Apply 1 Application topically every 3 (three) hours. To lip lesion prn   alendronate  (FOSAMAX ) 70 MG tablet TAKE 1 TABLET BY MOUTH WEEKLY  WITH A FULL GLASS OF WATER ON AN EMPTY STOMACH   aspirin 81 MG chewable tablet Chew 1 tablet by mouth daily.   baclofen  (LIORESAL ) 10 MG tablet Take 1 tablet (10 mg total) by mouth 2 (two) times daily.   blood glucose meter kit and supplies Dispense based on patient and insurance preference. Use up to four times daily  as directed. (FOR ICD-10 E10.9, E11.9).   ciclopirox (PENLAC) 8 % solution Apply topically at bedtime.   clotrimazole -betamethasone  (LOTRISONE ) cream Apply 1 Application topically 2 (two) times daily. To groin rash   Continuous Glucose Sensor (DEXCOM G7 SENSOR) MISC 1 each by Does not apply route continuous.   Melatonin 10 MG TABS Take 1 tablet by mouth at bedtime as needed.   Multiple Vitamin (MULTI-VITAMIN) tablet Take 1 tablet by mouth daily.   nystatin  cream (MYCOSTATIN ) Apply 1 Application topically 2 (two) times daily.   triamcinolone  (KENALOG ) 0.025 % ointment Apply 1 Application topically 2 (two) times daily.   [DISCONTINUED] atorvastatin  (LIPITOR) 10 MG tablet TAKE 1 TABLET BY MOUTH DAILY   [DISCONTINUED] lisinopril  (ZESTRIL ) 10 MG tablet TAKE 1 TABLET BY MOUTH DAILY   [DISCONTINUED] metFORMIN  (GLUCOPHAGE ) 500 MG tablet Take 1 tablet (500 mg total) by mouth daily with breakfast.  01/29/2024   11:17 AM 01/10/2024   10:25 AM 12/06/2023    9:10 AM 10/02/2023    9:15 AM  GAD 7 : Generalized Anxiety Score  Nervous, Anxious, on Edge 0 0 0 0  Control/stop worrying 0 0 0 0  Worry too much - different things 0 0 0   Trouble relaxing 0 0 0   Restless 0 0 0   Easily annoyed or irritable 0 0 0   Afraid - awful might happen 0 0 0   Total GAD 7 Score 0 0 0   Anxiety Difficulty Not difficult at all Not difficult at all Not difficult at all        01/29/2024   11:17 AM 01/10/2024   10:25 AM 12/06/2023    9:10 AM  Depression screen PHQ 2/9  Decreased Interest 0 0 0  Down, Depressed, Hopeless 0 0 0  PHQ - 2 Score 0 0 0  Altered sleeping 0 0 0  Tired, decreased energy 0 0 0  Change in appetite 0 0 0  Feeling bad or failure about yourself  0 0 0  Trouble concentrating 0 0 0  Moving slowly or fidgety/restless 0 0 0  Suicidal thoughts 0 0 0  PHQ-9 Score 0 0 0  Difficult doing work/chores Not difficult at all Not difficult at all Not difficult at all    BP Readings from Last 3  Encounters:  01/29/24 128/70  01/10/24 122/74  12/06/23 128/76    Physical Exam Vitals and nursing note reviewed.  Constitutional:      General: She is not in acute distress.    Appearance: She is well-developed.  HENT:     Head: Normocephalic and atraumatic.     Right Ear: Tympanic membrane and ear canal normal.     Left Ear: Tympanic membrane and ear canal normal.     Nose:     Right Sinus: No maxillary sinus tenderness.     Left Sinus: No maxillary sinus tenderness.  Eyes:     General: No scleral icterus.       Right eye: No discharge.        Left eye: No discharge.     Conjunctiva/sclera: Conjunctivae normal.  Neck:     Thyroid : No thyromegaly.     Vascular: No carotid bruit.  Cardiovascular:     Rate and Rhythm: Normal rate and regular rhythm.     Pulses: Normal pulses.     Heart sounds: Normal heart sounds.  Pulmonary:     Effort: Pulmonary effort is normal. No respiratory distress.     Breath sounds: No wheezing.  Abdominal:     General: Bowel sounds are normal.     Palpations: Abdomen is soft.     Tenderness: There is no abdominal tenderness.  Musculoskeletal:     Cervical back: Normal range of motion. No erythema.     Right lower leg: No edema.     Left lower leg: No edema.  Lymphadenopathy:     Cervical: No cervical adenopathy.  Skin:    General: Skin is warm and dry.     Findings: No rash.  Neurological:     Mental Status: She is alert and oriented to person, place, and time. Mental status is at baseline.     Cranial Nerves: No cranial nerve deficit.     Sensory: No sensory deficit.     Motor: Weakness present.     Gait: Gait abnormal.     Deep Tendon  Reflexes: Reflexes are normal and symmetric.     Comments: Dense LUE hemiparesis Left foot drop  Mild decreased in sensation left side of face  Psychiatric:        Attention and Perception: Attention normal.        Mood and Affect: Mood normal.        Speech: Speech normal.        Behavior: Behavior  normal.        Cognition and Memory: Cognition normal.     Wt Readings from Last 3 Encounters:  01/29/24 152 lb (68.9 kg)  01/10/24 152 lb (68.9 kg)  12/06/23 149 lb (67.6 kg)    BP 122/74   Pulse 87   Ht 4' 9 (1.448 m)   Wt 152 lb (68.9 kg)   SpO2 98%   BMI 32.89 kg/m   Assessment and Plan:  Problem List Items Addressed This Visit       Unprioritized   Diabetes mellitus treated with oral medication (HCC) (Chronic)   Blood sugars have been stable.  No hypoglycemic events since last visit. Currently medications are MTF. Last visit medical regimen changes were none. Lab Results  Component Value Date   HGBA1C 6.8 (A) 05/13/2023  Sees podiatry for foot exam and nail care. Eye exam up to date.        Relevant Medications   lisinopril  (ZESTRIL ) 10 MG tablet   metFORMIN  (GLUCOPHAGE ) 500 MG tablet   atorvastatin  (LIPITOR) 10 MG tablet   Other Relevant Orders   CBC with Differential/Platelet (Completed)   Comprehensive metabolic panel with GFR (Completed)   Hemoglobin A1c (Completed)   TSH (Completed)   Microalbumin / creatinine urine ratio (Completed)   Hemiparesis affecting left side as late effect of cerebrovascular accident (CVA) (HCC) (Chronic)   Long standing deficit since stroke 30 years ago. Uses walker or scooter to ambulate short distances but getting weaker. She has a wheelchair to use in the home for toileting, grooming and obtaining sustenance. Her current wheelchair is broken and can not be repaired. She is otherwise independent in ADLs such as dressing and feeding.      Hyperlipidemia associated with type 2 diabetes mellitus (HCC) (Chronic)   LDL is  Lab Results  Component Value Date   LDLCALC 83 12/13/2022   Currently taking atorvastatin .  No medication side effects or other concerns. Recommended LDL goal is < 70.       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      Relevant Medications   lisinopril  (ZESTRIL ) 10 MG tablet   metFORMIN  (GLUCOPHAGE ) 500 MG tablet   atorvastatin  (LIPITOR) 10 MG tablet   OP (osteoporosis) (Chronic)   On Alendronate  weekly for 10 years. Last DEXA in 2022 Will repeat DEXA and likely stop alendronate . Will also check vitamin d  and advise if she need to supplement.      Relevant Orders   DG Bone Density   VITAMIN D  25 Hydroxy (Vit-D Deficiency, Fractures) (Completed)   Other Visit Diagnoses       Annual physical exam    -  Primary   stabel exam; up to date on screenings and immunizations     Encounter for screening mammogram for breast cancer  Relevant Orders   MM 3D SCREENING MAMMOGRAM BILATERAL BREAST     Essential hypertension       Relevant Medications   lisinopril  (ZESTRIL ) 10 MG tablet   atorvastatin  (LIPITOR) 10 MG tablet     Type II diabetes mellitus with complication (HCC)       Relevant Medications   lisinopril  (ZESTRIL ) 10 MG tablet   metFORMIN  (GLUCOPHAGE ) 500 MG tablet   atorvastatin  (LIPITOR) 10 MG tablet       Return in about 4 months (around 05/11/2024) for DM.    Leita HILARIO Adie, MD [facility] [place]

## 2024-01-10 NOTE — Assessment & Plan Note (Addendum)
 On Alendronate  weekly for 10 years. Last DEXA in 2022 Will repeat DEXA and likely stop alendronate . Will also check vitamin d  and advise if she need to supplement.

## 2024-01-10 NOTE — Patient Instructions (Signed)
 Call [facility] to schedule your mammogram and Bone Density at [phone].

## 2024-01-10 NOTE — Assessment & Plan Note (Addendum)
 Long standing deficit since stroke 30 years ago. Uses walker or scooter to ambulate short distances but getting weaker. She has a wheelchair to use in the home for toileting, grooming and obtaining sustenance. Her current wheelchair is broken and can not be repaired. She is otherwise independent in ADLs such as dressing and feeding.

## 2024-01-10 NOTE — Telephone Encounter (Signed)
 When patient left after her appt, she said she forgot to ask you about the glucometer you discussed at last visit.   Please advise?

## 2024-01-10 NOTE — Assessment & Plan Note (Addendum)
 Blood sugars have been stable.  No hypoglycemic events since last visit. Currently medications are MTF. Last visit medical regimen changes were none. Lab Results  Component Value Date   HGBA1C 6.8 (A) 05/13/2023  Sees podiatry for foot exam and nail care. Eye exam up to date.

## 2024-01-10 NOTE — Assessment & Plan Note (Signed)
 On ACEI daily.

## 2024-01-10 NOTE — Assessment & Plan Note (Signed)
 LDL is  Lab Results  Component Value Date   LDLCALC 83 12/13/2022   Currently taking atorvastatin .  No medication side effects or other concerns. Recommended LDL goal is < 70.

## 2024-01-11 LAB — COMPREHENSIVE METABOLIC PANEL WITH GFR
ALT: 19 IU/L (ref 0–32)
AST: 22 IU/L (ref 0–40)
Albumin: 4.1 g/dL (ref 3.8–4.8)
Alkaline Phosphatase: 53 IU/L (ref 44–121)
BUN/Creatinine Ratio: 16 (ref 12–28)
BUN: 10 mg/dL (ref 8–27)
Bilirubin Total: <0.2 mg/dL (ref 0.0–1.2)
CO2: 24 mmol/L (ref 20–29)
Calcium: 9.5 mg/dL (ref 8.7–10.3)
Chloride: 103 mmol/L (ref 96–106)
Creatinine, Ser: 0.61 mg/dL (ref 0.57–1.00)
Globulin, Total: 2.3 g/dL (ref 1.5–4.5)
Glucose: 83 mg/dL (ref 70–99)
Potassium: 4.4 mmol/L (ref 3.5–5.2)
Sodium: 143 mmol/L (ref 134–144)
Total Protein: 6.4 g/dL (ref 6.0–8.5)
eGFR: 90 mL/min/1.73 (ref >59)

## 2024-01-11 LAB — CBC WITH DIFFERENTIAL/PLATELET
Basophils Absolute: 0 x10E3/uL (ref 0.0–0.2)
Basos: 0 %
EOS (ABSOLUTE): 0.1 x10E3/uL (ref 0.0–0.4)
Eos: 2 %
Hematocrit: 40.5 % (ref 34.0–46.6)
Hemoglobin: 12.4 g/dL (ref 11.1–15.9)
Immature Grans (Abs): 0 x10E3/uL (ref 0.0–0.1)
Immature Granulocytes: 0 %
Lymphocytes Absolute: 2.3 x10E3/uL (ref 0.7–3.1)
Lymphs: 34 %
MCH: 24.1 pg — ABNORMAL LOW (ref 26.6–33.0)
MCHC: 30.6 g/dL — ABNORMAL LOW (ref 31.5–35.7)
MCV: 79 fL (ref 79–97)
Monocytes Absolute: 0.6 x10E3/uL (ref 0.1–0.9)
Monocytes: 9 %
Neutrophils Absolute: 3.7 x10E3/uL (ref 1.4–7.0)
Neutrophils: 55 %
Platelets: 270 x10E3/uL (ref 150–450)
RBC: 5.14 x10E6/uL (ref 3.77–5.28)
RDW: 18 % — ABNORMAL HIGH (ref 11.7–15.4)
WBC: 6.8 x10E3/uL (ref 3.4–10.8)

## 2024-01-11 LAB — LIPID PANEL
Chol/HDL Ratio: 2.4 ratio (ref 0.0–4.4)
Cholesterol, Total: 175 mg/dL (ref 100–199)
HDL: 74 mg/dL (ref >39)
LDL Chol Calc (NIH): 82 mg/dL (ref 0–99)
Triglycerides: 106 mg/dL (ref 0–149)
VLDL Cholesterol Cal: 19 mg/dL (ref 5–40)

## 2024-01-11 LAB — MICROALBUMIN / CREATININE URINE RATIO
Creatinine, Urine: 30.5 mg/dL
Microalb/Creat Ratio: 12 mg/g{creat} (ref 0–29)
Microalbumin, Urine: 3.6 ug/mL

## 2024-01-11 LAB — HEMOGLOBIN A1C
Est. average glucose Bld gHb Est-mCnc: 157 mg/dL
Hgb A1c MFr Bld: 7.1 % — ABNORMAL HIGH (ref 4.8–5.6)

## 2024-01-11 LAB — TSH: TSH: 2.92 u[IU]/mL (ref 0.450–4.500)

## 2024-01-11 LAB — VITAMIN D 25 HYDROXY (VIT D DEFICIENCY, FRACTURES): Vit D, 25-Hydroxy: 35.5 ng/mL (ref 30.0–100.0)

## 2024-01-12 ENCOUNTER — Ambulatory Visit: Payer: Self-pay | Admitting: Internal Medicine

## 2024-01-13 ENCOUNTER — Telehealth: Payer: Self-pay | Admitting: Internal Medicine

## 2024-01-13 NOTE — Telephone Encounter (Signed)
 Tried calling. Could not leave VM- it did not say it was secured VM. Waiting for call back to give verbal OK.

## 2024-01-13 NOTE — Telephone Encounter (Signed)
 Copied from CRM #[id number]. Topic: Clinical - Home Health Verbal Orders >> Jan 13, 2024 10:49 AM Larissa RAMAN wrote: Caller/Agency: [facility]  Callback Number: [phone] Service Requested: Occupational Therapy Frequency: 1x 5  Any new concerns about the patient? No

## 2024-01-13 NOTE — Telephone Encounter (Signed)
 Called patient- could not reach or leave VM. If patient returns call pls give her lab results and Dr Arvin note that Dexcom Glucose Monitor is not covered.   - CM

## 2024-01-16 ENCOUNTER — Telehealth: Payer: Self-pay | Admitting: Internal Medicine

## 2024-01-16 NOTE — Telephone Encounter (Signed)
 Called patient. Unable to reach her to inform her Dexcom CGM was not covered by her insurance. Have you discussed [facility] Referral for patient in the past?

## 2024-01-16 NOTE — Telephone Encounter (Signed)
 Copied from CRM [id number]. Topic: Referral - Status >> Jan 16, 2024  3:19 PM Larissa S wrote: Reason for CRM: Unique with [facility] calling on behalf of patient to check the status of referrals for diabetic monitor and home health aide. She request that we contact/followup with patient.

## 2024-01-22 ENCOUNTER — Ambulatory Visit: Payer: Self-pay

## 2024-01-22 ENCOUNTER — Other Ambulatory Visit: Payer: Self-pay

## 2024-01-22 DIAGNOSIS — E118 Type 2 diabetes mellitus with unspecified complications: Secondary | ICD-10-CM

## 2024-01-22 MED ORDER — POGO AUTOMATIC TEST CARTRIDGES VI TEST: 1.0000 | Freq: Every day | 3 refills | Status: AC

## 2024-01-22 NOTE — Telephone Encounter (Signed)
 Called pt let her know her A1C she verbalized understanding.  KP

## 2024-01-22 NOTE — Telephone Encounter (Signed)
 FYI Only or Action Required?: Action required by provider: lab or test result follow-up needed.  Patient was last seen in primary care on 01/10/2024 by Sharon Leita DEL, MD.  Called Nurse Triage reporting No chief complaint on file..  Symptoms began today.  Interventions attempted: Nothing.  Symptoms are: stable.  Triage Disposition: Call PCP When Office is Open  Patient/caregiver understands and will follow disposition?: YesCopied from CRM [id number]. Topic: Clinical - Lab/Test Results >> Jan 22, 2024  1:16 PM DeAngela L wrote: Reason for CRM: patient calling to get her A1C results from her lab appointment on 01/10/24? read Patient note from Dr Sharon about labs and dexcom monitor Reason for Disposition  [1] Caller requesting NON-URGENT health information AND [2] PCP's office is the best resource  Answer Assessment - Initial Assessment Questions 1. REASON FOR CALL: What is the main reason for your call? or How can I best help you? RN read MD notes of A1C. Pt is asking if script for pogo monitor. It takes cartilage and not strips. Pt is also concerned that physical therapy hasn't called this week.  Please advise  Protocols used: Information Only Call - No Triage-A-AH

## 2024-01-24 ENCOUNTER — Other Ambulatory Visit: Payer: Self-pay | Admitting: Internal Medicine

## 2024-01-24 DIAGNOSIS — I10 Essential (primary) hypertension: Secondary | ICD-10-CM

## 2024-01-24 DIAGNOSIS — E1169 Type 2 diabetes mellitus with other specified complication: Secondary | ICD-10-CM

## 2024-01-27 ENCOUNTER — Telehealth: Payer: Self-pay | Admitting: Internal Medicine

## 2024-01-27 NOTE — Telephone Encounter (Signed)
 Too soon for refill, LRF 01/10/24 for 90 and 1 RF.  Requested Prescriptions  Pending Prescriptions Disp Refills   atorvastatin  (LIPITOR) 10 MG tablet [Pharmacy Med Name: Atorvastatin  Calcium  10 MG Oral Tablet] 100 tablet 2    Sig: TAKE 1 TABLET BY MOUTH DAILY     Cardiovascular:  Antilipid - Statins Failed - 01/27/2024 10:42 AM      Failed - Lipid Panel in normal range within the last 12 months    Cholesterol, Total  Date Value Ref Range Status  01/10/2024 175 100 - 199 mg/dL Final   LDL Chol Calc (NIH)  Date Value Ref Range Status  01/10/2024 82 0 - 99 mg/dL Final   HDL  Date Value Ref Range Status  01/10/2024 74 >39 mg/dL Final   Triglycerides  Date Value Ref Range Status  01/10/2024 106 0 - 149 mg/dL Final         Passed - Patient is not pregnant      Passed - Valid encounter within last 12 months    Recent Outpatient Visits           2 weeks ago Annual physical exam   [place] [facility] at [facility], Leita DEL, MD   1 month ago Rotator cuff syndrome, right   [place] [facility], Selinda PARAS, MD   1 month ago Subconjunctival hemorrhage of right eye   [place] [facility] at [facility], Leita DEL, MD   2 months ago Rotator cuff syndrome, right   [place] [facility] at [facility], Selinda PARAS, MD   2 months ago Acute non-recurrent sinusitis, unspecified location   [facility] at [facility], Leita DEL, MD               lisinopril  (ZESTRIL ) 10 MG tablet [Pharmacy Med Name: Lisinopril  10 MG Oral Tablet] 100 tablet 2    Sig: TAKE 1 TABLET BY MOUTH DAILY     Cardiovascular:  ACE Inhibitors Passed - 01/27/2024 10:42 AM      Passed - Cr in normal range and within 180 days    Creatinine, Ser  Date Value Ref Range Status  01/10/2024 0.61 0.57 - 1.00 mg/dL Final          Passed - K in normal range and within 180 days    Potassium  Date Value Ref Range Status  01/10/2024 4.4 3.5 - 5.2 mmol/L Final         Passed - Patient is not pregnant      Passed - Last BP in normal range    BP Readings from Last 1 Encounters:  01/10/24 122/74         Passed - Valid encounter within last 6 months    Recent Outpatient Visits           2 weeks ago Annual physical exam   [place] [facility] at [facility], Leita DEL, MD   1 month ago Rotator cuff syndrome, right   [facility] at [facility], Selinda PARAS, MD   1 month ago Subconjunctival hemorrhage of right eye   [facility] at [facility], Leita DEL, MD   2 months ago Rotator cuff syndrome, right   [facility] &  [facility] Medicine at [employer] , Selinda PARAS, MD   2 months ago Acute non-recurrent sinusitis, unspecified location   [facility] at [facility], Leita DEL, MD

## 2024-01-27 NOTE — Telephone Encounter (Signed)
 Copied from CRM [id number]. Topic: Clinical - Home Health Verbal Orders >> Jan 27, 2024 12:33 PM Zebedee SAUNDERS wrote: Caller/Agency: [facility] Per Andrea Mose Callback Number: [phone] Service Requested: Occupational Therapy Frequency: once a week, but missed last week Any new concerns about the patient? No

## 2024-01-27 NOTE — Telephone Encounter (Signed)
 PCP informed of missed visit.

## 2024-01-29 ENCOUNTER — Ambulatory Visit (INDEPENDENT_AMBULATORY_CARE_PROVIDER_SITE_OTHER): Admitting: Internal Medicine

## 2024-01-29 ENCOUNTER — Encounter: Payer: Self-pay | Admitting: Internal Medicine

## 2024-01-29 VITALS — BP 128/70 | HR 91 | Temp 98.0°F | Ht <= 58 in | Wt 152.0 lb

## 2024-01-29 DIAGNOSIS — U071 COVID-19: Secondary | ICD-10-CM

## 2024-01-29 MED ORDER — PROMETHAZINE-DM 6.25-15 MG/5ML PO SYRP: 5.0000 mL | ORAL_SOLUTION | Freq: Four times a day (QID) | ORAL | 0 refills | Status: AC | PRN

## 2024-01-29 NOTE — Patient Instructions (Addendum)
 Take Tylenol 650 - 1000 mg every 6-8 hours for fever, body aches and headache. Drink plenty of fluids with electrolytes. Monitor for fever that does not decrease and/or shortness of breath that worsens or is present at rest. Quarantine for 5 days. May go into the community after 5 days but wear a mask for 5 more days.

## 2024-01-29 NOTE — Progress Notes (Signed)
 Date:  01/29/2024   Name:  Sharon York   DOB:  03-06-1946   MRN:  [id number]   Chief Complaint: Headache (Headache, sore throat, and  cough x 2 days.)  Headache  This is a new problem. The current episode started yesterday. The problem occurs constantly. The problem has been unchanged. Associated symptoms include coughing. Pertinent negatives include no dizziness, fever, nausea or vomiting.  Cough This is a new problem. The current episode started yesterday. The problem has been unchanged. The cough is Non-productive. Associated symptoms include headaches. Pertinent negatives include no chills, fever, shortness of breath or wheezing.    Review of Systems  Constitutional:  Positive for fatigue. Negative for chills, fever and unexpected weight change.  Respiratory:  Positive for cough. Negative for chest tightness, shortness of breath and wheezing.   Gastrointestinal:  Negative for diarrhea, nausea and vomiting.  Neurological:  Positive for headaches. Negative for dizziness.  Psychiatric/Behavioral:  Negative for dysphoric mood and sleep disturbance. The patient is not nervous/anxious.      Lab Results  Component Value Date   NA 143 01/10/2024   K 4.4 01/10/2024   CO2 24 01/10/2024   GLUCOSE 83 01/10/2024   BUN 10 01/10/2024   CREATININE 0.61 01/10/2024   CALCIUM  9.5 01/10/2024   EGFR 90 01/10/2024   GFRNONAA 79 12/03/2019   Lab Results  Component Value Date   CHOL 175 01/10/2024   HDL 74 01/10/2024   LDLCALC 82 01/10/2024   TRIG 106 01/10/2024   CHOLHDL 2.4 01/10/2024   Lab Results  Component Value Date   TSH 2.920 01/10/2024   Lab Results  Component Value Date   HGBA1C 7.1 (H) 01/10/2024   Lab Results  Component Value Date   WBC 6.8 01/10/2024   HGB 12.4 01/10/2024   HCT 40.5 01/10/2024   MCV 79 01/10/2024   PLT 270 01/10/2024   Lab Results  Component Value Date   ALT 19 01/10/2024   AST 22 01/10/2024   ALKPHOS 53 01/10/2024   BILITOT <0.2  01/10/2024   Lab Results  Component Value Date   VD25OH 35.5 01/10/2024     Patient Active Problem List   Diagnosis Date Noted   Rotator cuff syndrome, right 11/08/2023   Fever blister 10/02/2023   Trigger finger, right middle finger 12/11/2021   Microalbuminuria due to type 2 diabetes mellitus (HCC) 12/07/2019   Diabetes mellitus treated with oral medication (HCC) 04/21/2018   Right shoulder pain 03/31/2018   Hyperlipidemia associated with type 2 diabetes mellitus (HCC) 06/12/2017   Tinea corporis 03/17/2015   Altered bowel function 12/15/2014   Hemiparesis affecting left side as late effect of cerebrovascular accident (CVA) (HCC) 12/15/2014   Obstructive sleep apnea of adult 12/15/2014   Arthritis of shoulder region, 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     Past Surgical History:  Procedure Laterality Date   CESAREAN SECTION  1975   COLONOSCOPY  2010   normal    Social History   Tobacco Use   Smoking status: Never    Passive exposure: Never   Smokeless tobacco: Never   Tobacco comments:    smoking cessation materials not required  Vaping Use  Vaping status: Never Used  Substance Use Topics   Alcohol use: No    Alcohol/week: 0.0 standard drinks of alcohol   Drug use: Never     Medication list has been reviewed and updated.  Current Meds  Medication Sig   ACCU-CHEK GUIDE TEST test strip USE TO CHECK BLOOD GLUCOSE AS  INTRUCTED   Accu-Chek Softclix Lancets lancets USE TO CHECK BLOOD GLUCOSE UP TO 4 TIMES DAILY   acyclovir  cream (ZOVIRAX ) 5 % Apply 1 Application topically every 3 (three) hours. To lip lesion prn   alendronate  (FOSAMAX ) 70 MG tablet TAKE 1 TABLET BY MOUTH WEEKLY   WITH A FULL GLASS OF WATER ON AN EMPTY STOMACH   aspirin 81 MG chewable tablet Chew 1 tablet by mouth daily.   atorvastatin  (LIPITOR) 10 MG tablet Take 1 tablet (10 mg total) by mouth daily.   baclofen  (LIORESAL ) 10 MG tablet Take 1 tablet (10 mg total) by mouth 2 (two) times daily.   blood glucose meter kit and supplies Dispense based on patient and insurance preference. Use up to four times daily as directed. (FOR ICD-10 E10.9, E11.9).   ciclopirox (PENLAC) 8 % solution Apply topically at bedtime.   clotrimazole -betamethasone  (LOTRISONE ) cream Apply 1 Application topically 2 (two) times daily. To groin rash   Continuous Glucose Sensor (DEXCOM G7 SENSOR) MISC 1 each by Does not apply route continuous.   Glucose Blood Automatic (POGO AUTOMATIC TEST CARTRIDGES) TEST 1 each by In Vitro route daily.   lisinopril  (ZESTRIL ) 10 MG tablet Take 1 tablet (10 mg total) by mouth daily.   Melatonin 10 MG TABS Take 1 tablet by mouth at bedtime as needed.   metFORMIN  (GLUCOPHAGE ) 500 MG tablet Take 1 tablet (500 mg total) by mouth daily with breakfast.   Multiple Vitamin (MULTI-VITAMIN) tablet Take 1 tablet by mouth daily.   nystatin  cream (MYCOSTATIN ) Apply 1 Application topically 2 (two) times daily.   promethazine -dextromethorphan (PROMETHAZINE -DM) 6.25-15 MG/5ML syrup Take 5 mLs by mouth 4 (four) times daily as needed for up to 9 days for cough.   triamcinolone  (KENALOG ) 0.025 % ointment Apply 1 Application topically 2 (two) times daily.       01/29/2024   11:17 AM 01/10/2024   10:25 AM 12/06/2023    9:10 AM 10/02/2023    9:15 AM  GAD 7 : Generalized Anxiety Score  Nervous, Anxious, on Edge 0 0 0 0  Control/stop worrying 0 0 0 0  Worry too much - different things 0 0 0   Trouble relaxing 0 0 0   Restless 0 0 0   Easily annoyed or irritable 0 0 0   Afraid - awful might happen 0 0 0   Total GAD 7 Score 0 0 0   Anxiety Difficulty Not difficult at all Not difficult at all Not difficult at all         01/29/2024   11:17 AM 01/10/2024   10:25 AM 12/06/2023    9:10 AM  Depression screen PHQ 2/9  Decreased Interest 0 0 0  Down, Depressed, Hopeless 0 0 0  PHQ - 2 Score 0 0 0  Altered sleeping 0 0 0  Tired, decreased energy 0 0 0  Change in appetite 0 0 0  Feeling bad or failure about yourself  0 0 0  Trouble concentrating 0 0 0  Moving slowly or fidgety/restless 0 0 0  Suicidal thoughts 0 0 0  PHQ-9 Score 0 0 0  Difficult doing work/chores Not difficult  at all Not difficult at all Not difficult at all    BP Readings from Last 3 Encounters:  01/29/24 128/70  01/10/24 122/74  12/06/23 128/76    Physical Exam Vitals and nursing note reviewed.  Constitutional:      General: She is not in acute distress.    Appearance: She is well-developed.  HENT:     Head: Normocephalic and atraumatic.  Cardiovascular:     Rate and Rhythm: Normal rate and regular rhythm.  Pulmonary:     Effort: Pulmonary effort is normal. No respiratory distress.     Breath sounds: No wheezing or rhonchi.  Skin:    General: Skin is warm and dry.     Findings: No rash.  Neurological:     Mental Status: She is alert and oriented to person, place, and time.     Gait: Gait abnormal.  Psychiatric:        Mood and Affect: Mood normal.        Behavior: Behavior normal.     Wt Readings from Last 3 Encounters:  01/29/24 152 lb (68.9 kg)  01/10/24 152 lb (68.9 kg)  12/06/23 149 lb (67.6 kg)    BP 128/70   Pulse 91   Temp 98 F (36.7 C) (Oral)   Ht 4' 9 (1.448 m)   Wt 152 lb (68.9 kg)   SpO2 95%   BMI 32.89 kg/m   Assessment and Plan:  Problem List Items Addressed This Visit   None Visit Diagnoses       COVID-19 virus infection    -  Primary   usual care and precautions provided mild disease so will hold off on antivirals use cough syrup at night; tylenol, fluids   Relevant Medications   promethazine -dextromethorphan (PROMETHAZINE -DM) 6.25-15 MG/5ML syrup       No follow-ups  on file.    Leita HILARIO Adie, MD [facility] [place]

## 2024-02-06 ENCOUNTER — Telehealth: Payer: Self-pay

## 2024-02-06 NOTE — Telephone Encounter (Signed)
 Copied from CRM #[id number]. Topic: General - Other >> Feb 06, 2024 10:42 AM Myrick T wrote: Reason for CRM: Leita Glatter from [facility] stated she has not been able to contact patient for her visit today. Leita wanted to report missed visit

## 2024-02-20 ENCOUNTER — Other Ambulatory Visit: Payer: Self-pay | Admitting: Internal Medicine

## 2024-02-20 ENCOUNTER — Telehealth: Payer: Self-pay

## 2024-02-20 DIAGNOSIS — I69354 Hemiplegia and hemiparesis following cerebral infarction affecting left non-dominant side: Secondary | ICD-10-CM

## 2024-02-20 NOTE — Progress Notes (Unsigned)
 Date:  02/20/2024   Name:  Sharon York   DOB:  29-Jan-1946   MRN:  [id number]   Chief Complaint: No chief complaint on file.  HPI  Review of Systems   Lab Results  Component Value Date   NA 143 01/10/2024   K 4.4 01/10/2024   CO2 24 01/10/2024   GLUCOSE 83 01/10/2024   BUN 10 01/10/2024   CREATININE 0.61 01/10/2024   CALCIUM  9.5 01/10/2024   EGFR 90 01/10/2024   GFRNONAA 79 12/03/2019   Lab Results  Component Value Date   CHOL 175 01/10/2024   HDL 74 01/10/2024   LDLCALC 82 01/10/2024   TRIG 106 01/10/2024   CHOLHDL 2.4 01/10/2024   Lab Results  Component Value Date   TSH 2.920 01/10/2024   Lab Results  Component Value Date   HGBA1C 7.1 (H) 01/10/2024   Lab Results  Component Value Date   WBC 6.8 01/10/2024   HGB 12.4 01/10/2024   HCT 40.5 01/10/2024   MCV 79 01/10/2024   PLT 270 01/10/2024   Lab Results  Component Value Date   ALT 19 01/10/2024   AST 22 01/10/2024   ALKPHOS 53 01/10/2024   BILITOT <0.2 01/10/2024   Lab Results  Component Value Date   VD25OH 35.5 01/10/2024     Patient Active Problem List   Diagnosis Date Noted   Rotator cuff syndrome, right 11/08/2023   Fever blister 10/02/2023   Trigger finger, right middle finger 12/11/2021   Microalbuminuria due to type 2 diabetes mellitus (HCC) 12/07/2019   Diabetes mellitus treated with oral medication (HCC) 04/21/2018   Right shoulder pain 03/31/2018   Hyperlipidemia associated with type 2 diabetes mellitus (HCC) 06/12/2017   Tinea corporis 03/17/2015   Altered bowel function 12/15/2014   Hemiparesis affecting left side as late effect of cerebrovascular accident (CVA) (HCC) 12/15/2014   Obstructive sleep apnea of adult 12/15/2014   Arthritis of shoulder region, degenerative 12/15/2014   OP (osteoporosis) 12/15/2014   Allergic rhinitis, seasonal 12/15/2014   Mixed incontinence 03/11/2013    Allergies  Allergen Reactions   Nitrofurantoin  Nausea Only   Iodinated Contrast Media  Hives and Cough    Patient developed a hive on her left lip after injection as well as some coughing.     Past Surgical History:  Procedure Laterality Date   CESAREAN SECTION  1975   COLONOSCOPY  2010   normal    Social History   Tobacco Use   Smoking status: Never    Passive exposure: Never   Smokeless tobacco: Never   Tobacco comments:    smoking cessation materials not required  Vaping Use   Vaping status: Never Used  Substance Use Topics   Alcohol use: No    Alcohol/week: 0.0 standard drinks of alcohol   Drug use: Never     Medication list has been reviewed and updated.  No outpatient medications have been marked as taking for the 02/20/24 encounter (Orders Only) with Justus Leita DEL, MD.       01/29/2024   11:17 AM 01/10/2024   10:25 AM 12/06/2023    9:10 AM 10/02/2023    9:15 AM  GAD 7 : Generalized Anxiety Score  Nervous, Anxious, on Edge 0 0 0 0  Control/stop worrying 0 0 0 0  Worry too much - different things 0 0 0   Trouble relaxing 0 0 0   Restless 0 0 0   Easily annoyed or irritable 0 0 0  Afraid - awful might happen 0 0 0   Total GAD 7 Score 0 0 0   Anxiety Difficulty Not difficult at all Not difficult at all Not difficult at all        01/29/2024   11:17 AM 01/10/2024   10:25 AM 12/06/2023    9:10 AM  Depression screen PHQ 2/9  Decreased Interest 0 0 0  Down, Depressed, Hopeless 0 0 0  PHQ - 2 Score 0 0 0  Altered sleeping 0 0 0  Tired, decreased energy 0 0 0  Change in appetite 0 0 0  Feeling bad or failure about yourself  0 0 0  Trouble concentrating 0 0 0  Moving slowly or fidgety/restless 0 0 0  Suicidal thoughts 0 0 0  PHQ-9 Score 0 0 0  Difficult doing work/chores Not difficult at all Not difficult at all Not difficult at all    BP Readings from Last 3 Encounters:  01/29/24 128/70  01/10/24 122/74  12/06/23 128/76    Physical Exam  Wt Readings from Last 3 Encounters:  01/29/24 152 lb (68.9 kg)  01/10/24 152 lb (68.9 kg)   12/06/23 149 lb (67.6 kg)    There were no vitals taken for this visit.  Assessment and Plan:  Problem List Items Addressed This Visit   None   No follow-ups on file.    Leita HILARIO Adie, MD [facility] [place]

## 2024-02-20 NOTE — Telephone Encounter (Signed)
 Copied from CRM #[id number]. Topic: Clinical - Order For Equipment >> Feb 20, 2024  8:30 AM Sharon York wrote: Reason for CRM: Patient called in wanting to know if Dr. Justus could order another wheelchair due to the one she has now coming apart. Would like it order from [facility] Please call [phone] if needed.

## 2024-02-20 NOTE — Telephone Encounter (Unsigned)
 Copied from CRM #[id number]. Topic: Clinical - Order For Equipment >> Feb 20, 2024  8:30 AM Deaijah H wrote: Reason for CRM: Patient called in wanting to know if Dr. Justus could order another wheelchair due to the one she has now coming apart. Would like it order from [facility] Please call [phone] if needed. >> Feb 20, 2024  9:19 AM Antwanette L wrote: Patient called back regarding the request for a wheelchair. [employer] requires documentation from Dr. Justus stating the medical reason for the wheelchair. The information should be faxed to [phone]. For any questions, [employer] can be reached at [phone]  [facility]  [address], [place], [place] [address]

## 2024-02-20 NOTE — Telephone Encounter (Signed)
 Copied from CRM #[id number]. Topic: Clinical - Order For Equipment >> Feb 20, 2024  8:30 AM Deaijah H wrote: Reason for CRM: Patient called in wanting to know if Dr. Justus could order another wheelchair due to the one she has now coming apart. Would like it order from [facility] Please call [phone] if needed. >> Feb 20, 2024  2:21 PM Antwanette L wrote: Patient called back to  get  an update on her wheelchair order. She was informed that Dr. Justus completed a DME order for a wheelchair, and the order is currently in the box to be faxed.  >> Feb 20, 2024  9:19 AM Antwanette L wrote: Patient called back regarding the request for a wheelchair. [employer] requires documentation from Dr. Justus stating the medical reason for the wheelchair. The information should be faxed to [phone]. For any questions, [employer] [facility] [employer] can be reached at [phone]  [facility]  [address], [place], [place] [address]

## 2024-02-21 ENCOUNTER — Telehealth: Payer: Self-pay

## 2024-02-21 NOTE — Telephone Encounter (Signed)
 Tried to call patient to inform her of this being faxed. No answer. E2C2 can inform patient we faxed a wheelchair order to the medical supply listed when patient returns call.  Thanks!

## 2024-02-21 NOTE — Telephone Encounter (Signed)
Re faxed new note

## 2024-02-21 NOTE — Telephone Encounter (Signed)
 Copied from CRM #[id number]. Topic: Clinical - Order For Equipment >> Feb 21, 2024 12:49 PM Myrick T wrote: Reason for CRM: Verneita from [facility] [phone] called stated they need the narrative for the Hemi-wheelchair to be separate from the order. They need it in the note section. Please fax to [phone]

## 2024-02-24 ENCOUNTER — Other Ambulatory Visit: Payer: Self-pay | Admitting: Internal Medicine

## 2024-02-24 ENCOUNTER — Telehealth: Payer: Self-pay

## 2024-02-24 DIAGNOSIS — I69354 Hemiplegia and hemiparesis following cerebral infarction affecting left non-dominant side: Secondary | ICD-10-CM

## 2024-02-24 NOTE — Progress Notes (Unsigned)
 Date:  02/24/2024   Name:  Sharon York   DOB:  November 22, 1945   MRN:  [id number]   Chief Complaint: No chief complaint on file.  HPI  Review of Systems   Lab Results  Component Value Date   NA 143 01/10/2024   K 4.4 01/10/2024   CO2 24 01/10/2024   GLUCOSE 83 01/10/2024   BUN 10 01/10/2024   CREATININE 0.61 01/10/2024   CALCIUM  9.5 01/10/2024   EGFR 90 01/10/2024   GFRNONAA 79 12/03/2019   Lab Results  Component Value Date   CHOL 175 01/10/2024   HDL 74 01/10/2024   LDLCALC 82 01/10/2024   TRIG 106 01/10/2024   CHOLHDL 2.4 01/10/2024   Lab Results  Component Value Date   TSH 2.920 01/10/2024   Lab Results  Component Value Date   HGBA1C 7.1 (H) 01/10/2024   Lab Results  Component Value Date   WBC 6.8 01/10/2024   HGB 12.4 01/10/2024   HCT 40.5 01/10/2024   MCV 79 01/10/2024   PLT 270 01/10/2024   Lab Results  Component Value Date   ALT 19 01/10/2024   AST 22 01/10/2024   ALKPHOS 53 01/10/2024   BILITOT <0.2 01/10/2024   Lab Results  Component Value Date   VD25OH 35.5 01/10/2024     Patient Active Problem List   Diagnosis Date Noted   Rotator cuff syndrome, right 11/08/2023   Fever blister 10/02/2023   Trigger finger, right middle finger 12/11/2021   Microalbuminuria due to type 2 diabetes mellitus (HCC) 12/07/2019   Diabetes mellitus treated with oral medication (HCC) 04/21/2018   Right shoulder pain 03/31/2018   Hyperlipidemia associated with type 2 diabetes mellitus (HCC) 06/12/2017   Tinea corporis 03/17/2015   Altered bowel function 12/15/2014   Hemiparesis affecting left side as late effect of cerebrovascular accident (CVA) (HCC) 12/15/2014   Obstructive sleep apnea of adult 12/15/2014   Arthritis of shoulder region, degenerative 12/15/2014   OP (osteoporosis) 12/15/2014   Allergic rhinitis, seasonal 12/15/2014   Mixed incontinence 03/11/2013    Allergies  Allergen Reactions   Nitrofurantoin  Nausea Only   Iodinated Contrast Media  Hives and Cough    Patient developed a hive on her left lip after injection as well as some coughing.     Past Surgical History:  Procedure Laterality Date   CESAREAN SECTION  1975   COLONOSCOPY  2010   normal    Social History   Tobacco Use   Smoking status: Never    Passive exposure: Never   Smokeless tobacco: Never   Tobacco comments:    smoking cessation materials not required  Vaping Use   Vaping status: Never Used  Substance Use Topics   Alcohol use: No    Alcohol/week: 0.0 standard drinks of alcohol   Drug use: Never     Medication list has been reviewed and updated.  No outpatient medications have been marked as taking for the 02/24/24 encounter (Orders Only) with Justus Leita DEL, MD.       01/29/2024   11:17 AM 01/10/2024   10:25 AM 12/06/2023    9:10 AM 10/02/2023    9:15 AM  GAD 7 : Generalized Anxiety Score  Nervous, Anxious, on Edge 0 0 0 0  Control/stop worrying 0 0 0 0  Worry too much - different things 0 0 0   Trouble relaxing 0 0 0   Restless 0 0 0   Easily annoyed or irritable 0 0 0  Afraid - awful might happen 0 0 0   Total GAD 7 Score 0 0 0   Anxiety Difficulty Not difficult at all Not difficult at all Not difficult at all        01/29/2024   11:17 AM 01/10/2024   10:25 AM 12/06/2023    9:10 AM  Depression screen PHQ 2/9  Decreased Interest 0 0 0  Down, Depressed, Hopeless 0 0 0  PHQ - 2 Score 0 0 0  Altered sleeping 0 0 0  Tired, decreased energy 0 0 0  Change in appetite 0 0 0  Feeling bad or failure about yourself  0 0 0  Trouble concentrating 0 0 0  Moving slowly or fidgety/restless 0 0 0  Suicidal thoughts 0 0 0  PHQ-9 Score 0 0 0  Difficult doing work/chores Not difficult at all Not difficult at all Not difficult at all    BP Readings from Last 3 Encounters:  01/29/24 128/70  01/10/24 122/74  12/06/23 128/76    Physical Exam  Wt Readings from Last 3 Encounters:  01/29/24 152 lb (68.9 kg)  01/10/24 152 lb (68.9 kg)   12/06/23 149 lb (67.6 kg)    There were no vitals taken for this visit.  Assessment and Plan:  Problem List Items Addressed This Visit   None   No follow-ups on file.    Leita HILARIO Adie, MD [facility] [place]

## 2024-02-24 NOTE — Telephone Encounter (Signed)
 Copied from CRM [id number]. Topic: Clinical - Order For Equipment >> Feb 24, 2024  8:45 AM Thersia BROCKS wrote: Reason for CRM: Patient called in regarding her wheelchair order stated they only have standard but she would like the electric wheelchair, stated she talked to the DME company, [employer] and they stated that patient needed to inform provider

## 2024-02-25 ENCOUNTER — Telehealth: Payer: Self-pay

## 2024-02-25 NOTE — Telephone Encounter (Signed)
 Copied from CRM #[id number]. Topic: Clinical - Order For Equipment >> Feb 25, 2024 10:05 AM Joesph NOVAK wrote: Reason for CRM: Wheel chair order, missing narrative in notes. [employer], Renee. She is asking that we update the order and specify why its needed.

## 2024-02-26 ENCOUNTER — Ambulatory Visit (INDEPENDENT_AMBULATORY_CARE_PROVIDER_SITE_OTHER): Admitting: Internal Medicine

## 2024-02-26 ENCOUNTER — Encounter: Payer: Self-pay | Admitting: Internal Medicine

## 2024-02-26 VITALS — BP 126/74 | HR 84 | Ht <= 58 in | Wt 152.4 lb

## 2024-02-26 DIAGNOSIS — I69354 Hemiplegia and hemiparesis following cerebral infarction affecting left non-dominant side: Secondary | ICD-10-CM

## 2024-02-26 DIAGNOSIS — Z23 Encounter for immunization: Secondary | ICD-10-CM

## 2024-02-26 NOTE — Assessment & Plan Note (Addendum)
 Currently uses electric wheelchair in the home to safely perform toileting functions and for access to the kitchen for meal preparation.  Her current wheelchair is broken and can not be repaired.  A scooter can not be used in the home due to poor postural stability.  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 She can not use a cane or walker due to both leg weakness and arm paresis. She is able to use the electric wheelchair to get to the toilet, the kitchen and to her bedroom safely. There is no expectation of improvement.  The stroke occurred in 1995. She needs a new electric wheelchair - form will be completed and Rx sent to [facility].

## 2024-02-26 NOTE — Progress Notes (Signed)
 Date:  02/26/2024   Name:  Sharon York   DOB:  1945-10-08   MRN:  [id number]   Chief Complaint: mobility (Face to Face mobility evaluation./)  Extremity Weakness  The pain is present in the left arm and left lower leg. This is a chronic problem. The problem occurs constantly. The problem has been unchanged. Pertinent negatives include no fever or numbness.   Currently uses electric wheelchair in the home to safely perform toileting functions and for access to the kitchen for meal preparation.   Her current wheelchair is broken and can not be repaired.   A scooter can not be used in the home due to poor postural stability.   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 She can not use a cane or walker due to both leg weakness and arm paresis. She is able to use the electric wheelchair to get to the toilet, the kitchen and to her bedroom safely and quickly. There is no expectation of improvement.  The stroke occurred in 1995.   Review of Systems  Constitutional:  Negative for chills, fatigue, fever and unexpected weight change.  Respiratory:  Negative for chest tightness and shortness of breath.   Cardiovascular:  Negative for chest pain.  Gastrointestinal:  Negative for abdominal pain.  Musculoskeletal:  Positive for arthralgias, extremity weakness and gait problem.  Neurological:  Positive for weakness (LUE and left foot drop) and headaches. Negative for dizziness and numbness.  Psychiatric/Behavioral:  Negative for dysphoric mood and sleep disturbance. The patient is not nervous/anxious.     Current pain level 7/10 due headache.  Lab Results  Component Value Date   NA 143 01/10/2024   K 4.4 01/10/2024   CO2 24 01/10/2024   GLUCOSE 83 01/10/2024   BUN 10 01/10/2024   CREATININE 0.61 01/10/2024   CALCIUM  9.5 01/10/2024   EGFR 90 01/10/2024   GFRNONAA 79 12/03/2019   Lab Results  Component Value Date   CHOL 175 01/10/2024   HDL 74 01/10/2024   LDLCALC 82  01/10/2024   TRIG 106 01/10/2024   CHOLHDL 2.4 01/10/2024   Lab Results  Component Value Date   TSH 2.920 01/10/2024   Lab Results  Component Value Date   HGBA1C 7.1 (H) 01/10/2024   Lab Results  Component Value Date   WBC 6.8 01/10/2024   HGB 12.4 01/10/2024   HCT 40.5 01/10/2024   MCV 79 01/10/2024   PLT 270 01/10/2024   Lab Results  Component Value Date   ALT 19 01/10/2024   AST 22 01/10/2024   ALKPHOS 53 01/10/2024   BILITOT <0.2 01/10/2024   Lab Results  Component Value Date   VD25OH 35.5 01/10/2024     Patient Active Problem List   Diagnosis Date Noted   Rotator cuff syndrome, right 11/08/2023   Fever blister 10/02/2023   Trigger finger, right middle finger 12/11/2021   Microalbuminuria due to type 2 diabetes mellitus (HCC) 12/07/2019   Diabetes mellitus treated with oral medication (HCC) 04/21/2018   Right shoulder pain 03/31/2018   Hyperlipidemia associated with type 2 diabetes mellitus (HCC) 06/12/2017   Tinea corporis 03/17/2015   Altered bowel function 12/15/2014   Hemiparesis affecting left side as late effect of cerebrovascular accident (CVA) (HCC) 12/15/2014   Obstructive sleep apnea of adult 12/15/2014   Arthritis of shoulder region, 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     Past Surgical History:  Procedure Laterality Date   CESAREAN SECTION  1975   COLONOSCOPY  2010   normal    Social History   Tobacco Use   Smoking status: Never    Passive exposure: Never   Smokeless tobacco: Never   Tobacco comments:    smoking cessation materials not required  Vaping Use   Vaping status: Never Used  Substance Use Topics   Alcohol use: No    Alcohol/week: 0.0  standard drinks of alcohol   Drug use: Never     Medication list has been reviewed and updated.  Current Meds  Medication Sig   ACCU-CHEK GUIDE TEST test strip USE TO CHECK BLOOD GLUCOSE AS  INTRUCTED   Accu-Chek Softclix Lancets lancets USE TO CHECK BLOOD GLUCOSE UP TO 4 TIMES DAILY   alendronate  (FOSAMAX ) 70 MG tablet TAKE 1 TABLET BY MOUTH WEEKLY  WITH A FULL GLASS OF WATER ON AN EMPTY STOMACH   aspirin 81 MG chewable tablet Chew 1 tablet by mouth daily.   atorvastatin  (LIPITOR) 10 MG tablet Take 1 tablet (10 mg total) by mouth daily.   baclofen  (LIORESAL ) 10 MG tablet Take 1 tablet (10 mg total) by mouth 2 (two) times daily.   blood glucose meter kit and supplies Dispense based on patient and insurance preference. Use up to four times daily as directed. (FOR ICD-10 E10.9, E11.9).   ciclopirox (PENLAC) 8 % solution Apply topically at bedtime.   Continuous Glucose Sensor (DEXCOM G7 SENSOR) MISC 1 each by Does not apply route continuous.   Glucose Blood Automatic (POGO AUTOMATIC TEST CARTRIDGES) TEST 1 each by In Vitro route daily.   lisinopril  (ZESTRIL ) 10 MG tablet Take 1 tablet (10 mg total) by mouth daily.   Melatonin 10 MG TABS Take 1 tablet by mouth at bedtime as needed.   metFORMIN  (GLUCOPHAGE ) 500 MG tablet Take 1 tablet (500 mg total) by mouth daily with breakfast.   Multiple Vitamin (MULTI-VITAMIN) tablet Take 1 tablet by mouth daily.       01/29/2024   11:17 AM 01/10/2024   10:25 AM 12/06/2023    9:10 AM 10/02/2023    9:15 AM  GAD 7 : Generalized Anxiety Score  Nervous, Anxious, on Edge 0 0 0 0  Control/stop worrying 0 0 0 0  Worry too much - different things 0 0 0   Trouble relaxing 0 0 0   Restless 0 0 0   Easily annoyed or irritable 0 0 0   Afraid - awful might happen 0 0 0   Total GAD 7 Score 0 0 0   Anxiety Difficulty Not difficult at all Not difficult at all Not difficult at all        02/26/2024    2:00 PM 01/29/2024   11:17 AM 01/10/2024   10:25 AM   Depression screen PHQ 2/9  Decreased Interest 0 0 0  Down, Depressed, Hopeless 0 0 0  PHQ - 2 Score 0 0 0  Altered sleeping  0 0  Tired, decreased energy  0 0  Change in appetite  0 0  Feeling bad or failure about yourself   0 0  Trouble concentrating  0 0  Moving slowly or fidgety/restless  0 0  Suicidal thoughts  0 0  PHQ-9 Score  0 0  Difficult doing work/chores  Not difficult at all Not difficult at all    BP Readings from Last 3 Encounters:  02/26/24 126/74  01/29/24 128/70  01/10/24 122/74    Physical Exam Vitals and nursing note reviewed.  Constitutional:      General: She is not in acute distress.    Appearance: She is well-developed. She is obese.  HENT:     Head: Normocephalic and atraumatic.  Cardiovascular:     Rate and Rhythm: Normal rate and regular rhythm.  Pulmonary:     Effort: Pulmonary effort is normal. No respiratory distress.     Breath sounds: No wheezing or rhonchi.  Musculoskeletal:     Right shoulder: Normal.     Left shoulder: Decreased range of motion. Decreased strength.     Cervical back: Normal range of motion.     Right foot: No foot drop.     Left foot: Foot drop present.  Lymphadenopathy:     Cervical: No cervical adenopathy.  Skin:    General: Skin is warm and dry.     Findings: No rash.  Neurological:     Mental Status: She is alert and oriented to person, place, and time.     Motor: Weakness present.     Coordination: Coordination abnormal.     Gait: Gait abnormal (very slow and unsteady).     Comments: 5/5 RUE strength  0/5 LUE strength  5/5 RLE strength  3/5 LLE strength  Psychiatric:        Mood and Affect: Mood normal.        Behavior: Behavior normal.     Wt Readings from Last 3 Encounters:  02/26/24 152 lb 6.4 oz (69.1 kg)  01/29/24 152 lb (68.9 kg)  01/10/24 152 lb (68.9 kg)    BP 126/74   Pulse 84   Ht 4' 8 (1.422 m)   Wt 152 lb 6.4 oz (69.1 kg)   SpO2 95%   BMI 34.17 kg/m   Assessment and  Plan:  Problem List Items Addressed This Visit       Unprioritized   Hemiparesis affecting left side as late effect of cerebrovascular accident (CVA) (HCC) - Primary (Chronic)   Currently uses electric wheelchair in the home to safely perform toileting functions and for access to the kitchen for meal preparation.  Her current wheelchair is broken and can not be repaired.  A scooter can not be used in the home due to poor postural stability.  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 She can not use a cane or walker due to both leg weakness and arm paresis. She is able to use the electric wheelchair to get to the toilet, the kitchen and to her bedroom safely. There is no expectation of improvement.  The stroke occurred in 1995. She needs a new electric wheelchair - form will be completed and Rx sent to [facility].      Other Visit Diagnoses       Encounter for immunization       Relevant Orders   Flu vaccine HIGH DOSE PF(Fluzone Trivalent) (Completed)       No follow-ups on file.    Leita HILARIO Adie, MD [facility] [place]

## 2024-03-02 ENCOUNTER — Telehealth: Payer: Self-pay

## 2024-03-02 NOTE — Telephone Encounter (Signed)
 Copied from CRM [id number]. Topic: General - Other >> Mar 02, 2024  2:17 PM Berwyn MATSU wrote: Reason for CRM: [facility] called in to advise that patient missed 1 session of OT.

## 2024-03-03 ENCOUNTER — Telehealth: Payer: Self-pay

## 2024-03-03 NOTE — Telephone Encounter (Signed)
 Copied from CRM #[id number]. Topic: General - Other >> Mar 03, 2024 12:01 PM Selinda RAMAN wrote: Reason for CRM: Delon with [facility] [employer] called in stating she received exam notes and a prescription for a power chair but no demographic information on the patient. Please fax demographic information to [phone] and if any questions call [phone]

## 2024-03-03 NOTE — Telephone Encounter (Signed)
Printed and faxed  KP 

## 2024-03-04 ENCOUNTER — Other Ambulatory Visit: Payer: Self-pay | Admitting: Internal Medicine

## 2024-03-04 DIAGNOSIS — E118 Type 2 diabetes mellitus with unspecified complications: Secondary | ICD-10-CM

## 2024-03-05 ENCOUNTER — Telehealth: Payer: Self-pay

## 2024-03-05 NOTE — Telephone Encounter (Signed)
 Copied from CRM #[id number]. Topic: Clinical - Order For Equipment >> Mar 05, 2024  9:49 AM Selinda RAMAN wrote: Reason for CRM: Sharon York with [facility] [employer] called to tell the provider they do not work with [facility] unfortunately and the patient now knowing this is requesting her order, notes and demographics to go to [facility] for her power chair. Please assist patient further and if any questions for Sharon York please call [phone]

## 2024-03-06 NOTE — Telephone Encounter (Signed)
 Requested medications are due for refill today.  unusure  Requested medications are on the active medications list.  yes  Last refill. 06/25/2023 #400 2 rf  Future visit scheduled.   yes   Notes to clinic.  Protocol will not attach. Please review for refill.    Requested Prescriptions  Pending Prescriptions Disp Refills   ACCU-CHEK GUIDE TEST test strip [Pharmacy Med Name: Accu-Chek Guide In Vitro Strip] 400 strip 2    Sig: USE TO CHECK BLOOD GLUCOSE AS  INTRUCTED     There is no refill protocol information for this order

## 2024-03-09 ENCOUNTER — Telehealth: Payer: Self-pay | Admitting: Internal Medicine

## 2024-03-09 NOTE — Telephone Encounter (Signed)
 Spoke with Sharon York at [facility] - Patient had called them and states she wanted Home Health order. They let her know she wold need a new order from PCP. Let me know if you want me to place Home Health orders and under who. Thank you  JM

## 2024-03-09 NOTE — Telephone Encounter (Signed)
 Copied from CRM #[id number]. Topic: Referral - Request for Referral >> Mar 09, 2024 10:45 AM Wess RAMAN wrote: Did the patient discuss referral with their provider in the last year? Yes (If No - schedule appointment) (If Yes - send message)  Appointment offered? No  Type of order/referral and detailed reason for visit: Home Health  Preference of office, provider, location:  [facility] Phone: [phone]  If referral order, have you been seen by this specialty before? Yes (If Yes, this issue or another issue? When? Where?  Can we respond through MyChart? No

## 2024-03-09 NOTE — Telephone Encounter (Signed)
 Please review.  KP

## 2024-03-10 ENCOUNTER — Other Ambulatory Visit: Payer: Self-pay | Admitting: Internal Medicine

## 2024-03-10 DIAGNOSIS — I69354 Hemiplegia and hemiparesis following cerebral infarction affecting left non-dominant side: Secondary | ICD-10-CM

## 2024-03-10 NOTE — Progress Notes (Unsigned)
 Date:  03/10/2024   Name:  Sharon York   DOB:  08/31/1945   MRN:  [id number]   Chief Complaint: No chief complaint on file.  HPI  Review of Systems   Lab Results  Component Value Date   NA 143 01/10/2024   K 4.4 01/10/2024   CO2 24 01/10/2024   GLUCOSE 83 01/10/2024   BUN 10 01/10/2024   CREATININE 0.61 01/10/2024   CALCIUM  9.5 01/10/2024   EGFR 90 01/10/2024   GFRNONAA 79 12/03/2019   Lab Results  Component Value Date   CHOL 175 01/10/2024   HDL 74 01/10/2024   LDLCALC 82 01/10/2024   TRIG 106 01/10/2024   CHOLHDL 2.4 01/10/2024   Lab Results  Component Value Date   TSH 2.920 01/10/2024   Lab Results  Component Value Date   HGBA1C 7.1 (H) 01/10/2024   Lab Results  Component Value Date   WBC 6.8 01/10/2024   HGB 12.4 01/10/2024   HCT 40.5 01/10/2024   MCV 79 01/10/2024   PLT 270 01/10/2024   Lab Results  Component Value Date   ALT 19 01/10/2024   AST 22 01/10/2024   ALKPHOS 53 01/10/2024   BILITOT <0.2 01/10/2024   Lab Results  Component Value Date   VD25OH 35.5 01/10/2024     Patient Active Problem List   Diagnosis Date Noted   Rotator cuff syndrome, right 11/08/2023   Fever blister 10/02/2023   Trigger finger, right middle finger 12/11/2021   Microalbuminuria due to type 2 diabetes mellitus (HCC) 12/07/2019   Diabetes mellitus treated with oral medication (HCC) 04/21/2018   Right shoulder pain 03/31/2018   Hyperlipidemia associated with type 2 diabetes mellitus (HCC) 06/12/2017   Tinea corporis 03/17/2015   Altered bowel function 12/15/2014   Hemiparesis affecting left side as late effect of cerebrovascular accident (CVA) (HCC) 12/15/2014   Obstructive sleep apnea of adult 12/15/2014   Arthritis of shoulder region, degenerative 12/15/2014   OP (osteoporosis) 12/15/2014   Allergic rhinitis, seasonal 12/15/2014   Mixed incontinence 03/11/2013    Allergies  Allergen Reactions   Nitrofurantoin  Nausea Only   Iodinated Contrast  Media Hives and Cough    Patient developed a hive on her left lip after injection as well as some coughing.     Past Surgical History:  Procedure Laterality Date   CESAREAN SECTION  1975   COLONOSCOPY  2010   normal    Social History   Tobacco Use   Smoking status: Never    Passive exposure: Never   Smokeless tobacco: Never   Tobacco comments:    smoking cessation materials not required  Vaping Use   Vaping status: Never Used  Substance Use Topics   Alcohol use: No    Alcohol/week: 0.0 standard drinks of alcohol   Drug use: Never     Medication list has been reviewed and updated.  No outpatient medications have been marked as taking for the 03/10/24 encounter (Orders Only) with Justus Leita DEL, MD.       01/29/2024   11:17 AM 01/10/2024   10:25 AM 12/06/2023    9:10 AM 10/02/2023    9:15 AM  GAD 7 : Generalized Anxiety Score  Nervous, Anxious, on Edge 0 0 0 0  Control/stop worrying 0 0 0 0  Worry too much - different things 0 0 0   Trouble relaxing 0 0 0   Restless 0 0 0   Easily annoyed or irritable 0 0 0  Afraid - awful might happen 0 0 0   Total GAD 7 Score 0 0 0   Anxiety Difficulty Not difficult at all Not difficult at all Not difficult at all        02/26/2024    2:00 PM 01/29/2024   11:17 AM 01/10/2024   10:25 AM  Depression screen PHQ 2/9  Decreased Interest 0 0 0  Down, Depressed, Hopeless 0 0 0  PHQ - 2 Score 0 0 0  Altered sleeping  0 0  Tired, decreased energy  0 0  Change in appetite  0 0  Feeling bad or failure about yourself   0 0  Trouble concentrating  0 0  Moving slowly or fidgety/restless  0 0  Suicidal thoughts  0 0  PHQ-9 Score  0 0  Difficult doing work/chores  Not difficult at all Not difficult at all    BP Readings from Last 3 Encounters:  02/26/24 126/74  01/29/24 128/70  01/10/24 122/74    Physical Exam  Wt Readings from Last 3 Encounters:  02/26/24 152 lb 6.4 oz (69.1 kg)  01/29/24 152 lb (68.9 kg)  01/10/24 152  lb (68.9 kg)    There were no vitals taken for this visit.  Assessment and Plan:  Problem List Items Addressed This Visit   None   No follow-ups on file.    Leita HILARIO Adie, MD [facility] [place]

## 2024-03-20 ENCOUNTER — Telehealth: Payer: Self-pay | Admitting: Pharmacist

## 2024-03-20 ENCOUNTER — Other Ambulatory Visit: Payer: Self-pay | Admitting: Pharmacist

## 2024-03-20 ENCOUNTER — Other Ambulatory Visit: Payer: Self-pay

## 2024-03-20 DIAGNOSIS — I10 Essential (primary) hypertension: Secondary | ICD-10-CM

## 2024-03-20 DIAGNOSIS — E119 Type 2 diabetes mellitus without complications: Secondary | ICD-10-CM

## 2024-03-20 DIAGNOSIS — E118 Type 2 diabetes mellitus with unspecified complications: Secondary | ICD-10-CM

## 2024-03-20 MED ORDER — METFORMIN HCL 500 MG PO TABS: 500.0000 mg | ORAL_TABLET | Freq: Every day | ORAL | 1 refills | Status: AC

## 2024-03-20 MED ORDER — LISINOPRIL 10 MG PO TABS: 10.0000 mg | ORAL_TABLET | Freq: Every day | ORAL | 1 refills | Status: AC

## 2024-03-20 NOTE — Progress Notes (Signed)
 Outreach to patient today as she was appearing on report for the adherence measure for diabetes and hypertension (ACEi/ARB) medications this calendar year.   Patient only has ~2 week supply of metformin  and lisinopril  remaining and prefers to receive refills via mail order, but note that latest renewals were sent to [facility].  Would you please re-send patient's metformin  and lisinopril  prescriptions to [facility] pharmacy for her (for 100 day supplies)?   Thank you!  Sharyle Sia, PharmD, [facility] [phone]

## 2024-03-20 NOTE — Progress Notes (Signed)
   03/20/2024  Patient ID: Sharon York, female   DOB: Jan 22, 1946, 78 y.o.   MRN: [id number]  This patient is appearing on a report for being at risk of failing the adherence measure for diabetes and hypertension (ACEi/ARB) medications this calendar year.   Medication: metformin  500 mg Last fill date: 11/10/2023 for 60 day supply  Medication: lisinopril  10 mg Last fill date: 10/27/2023 for 100 day supply   Outreach to patient by telephone today. Reports uses weekly pillbox to organize her medications, which she refills once weekly. Denies missed doses of her medications.   Patient reviews her medication supplies during our call and notes that she is getting low on both her metformin  and lisinopril  prescriptions; states has ~2 week supply remaining.  From review of chart, note latest renewals of both metformin  and lisinopril  prescriptions were sent to [facility] on 01/10/2024. However, patient prefers to use [facility] mail order. - Send message to PCP to request office resend metformin  and lisinopril  prescriptions to [facility] mail order, as requested by patient.  Sharyle Sia, PharmD, [facility] [phone]

## 2024-03-24 ENCOUNTER — Ambulatory Visit: Payer: Self-pay

## 2024-03-24 NOTE — Telephone Encounter (Signed)
 FYI Only or Action Required?: FYI only for provider: appointment scheduled on 03/25/2024 at 9:20 AM.  Patient was last seen in primary care on 02/26/2024 by Justus Leita DEL, MD.  Called Nurse Triage reporting Skin Problem.  Symptoms began several days ago.  Interventions attempted: Rest, hydration, or home remedies.  Symptoms are: unchanged.  Triage Disposition: See Physician Within 24 Hours  Patient/caregiver understands and will follow disposition?: Yes  Copied from CRM #[id number]. Topic: Clinical - Red Word Triage >> Mar 24, 2024  3:53 PM [name] wrote: Bad Pain on right side of buttocks only when she sits down. Her daughter gave her a bath and stated there was what looks like a pimple in the same area. Pt has used preporation H and it has helped. Pt would like to be seen asap Reason for Disposition  Looks like a boil or deep ulcer  Answer Assessment - Initial Assessment Questions Patient was told by daughter that she has what looks like a pimple to her right buttock. Patient endorses mild to moderate pain. Requesting to be seen in the office. Scheduled tomorrow for 9:20 AM  1. APPEARANCE of SORES: What do the sores look like?     Daughter told patient the sore looks like a pimple 2. NUMBER: How many sores are there?     1 3. SIZE: How big is the largest sore?     Unsure of how big it is 4. LOCATION: Where are the sores located?     Right buttock 5. ONSET: When did the sores begin?     Unsure of when the discomfort began 6. TENDER: Does it hurt when you touch it?  (Scale 1-10; or mild, moderate, severe)      Mild-moderate 7. CAUSE: What do you think is causing the sores?     unsure 8. OTHER SYMPTOMS: Do you have any other symptoms? (e.g., fever, new weakness)     no  Protocols used: Sores-A-AH

## 2024-03-25 ENCOUNTER — Ambulatory Visit: Admitting: Internal Medicine

## 2024-03-25 ENCOUNTER — Encounter: Payer: Self-pay | Admitting: Internal Medicine

## 2024-03-25 VITALS — BP 118/74 | HR 84 | Ht <= 58 in | Wt 150.0 lb

## 2024-03-25 DIAGNOSIS — T148XXA Other injury of unspecified body region, initial encounter: Secondary | ICD-10-CM | POA: Diagnosis not present

## 2024-03-25 NOTE — Progress Notes (Signed)
 Date:  03/25/2024   Name:  Sharon York   DOB:  1946-01-21   MRN:  [id number]   Chief Complaint: Rash (Right Buttocks. Painful x 1 week. )  Rash This is a new problem. The current episode started in the past 7 days. The problem is unchanged. The affected locations include the right buttock. The rash is characterized by pain. Pertinent negatives include no fatigue or shortness of breath.    Review of Systems  Constitutional:  Negative for chills and fatigue.  Respiratory:  Negative for chest tightness and shortness of breath.   Cardiovascular:  Negative for chest pain.  Skin:  Positive for rash.     Lab Results  Component Value Date   NA 143 01/10/2024   K 4.4 01/10/2024   CO2 24 01/10/2024   GLUCOSE 83 01/10/2024   BUN 10 01/10/2024   CREATININE 0.61 01/10/2024   CALCIUM  9.5 01/10/2024   EGFR 90 01/10/2024   GFRNONAA 79 12/03/2019   Lab Results  Component Value Date   CHOL 175 01/10/2024   HDL 74 01/10/2024   LDLCALC 82 01/10/2024   TRIG 106 01/10/2024   CHOLHDL 2.4 01/10/2024   Lab Results  Component Value Date   TSH 2.920 01/10/2024   Lab Results  Component Value Date   HGBA1C 7.1 (H) 01/10/2024   Lab Results  Component Value Date   WBC 6.8 01/10/2024   HGB 12.4 01/10/2024   HCT 40.5 01/10/2024   MCV 79 01/10/2024   PLT 270 01/10/2024   Lab Results  Component Value Date   ALT 19 01/10/2024   AST 22 01/10/2024   ALKPHOS 53 01/10/2024   BILITOT <0.2 01/10/2024   Lab Results  Component Value Date   VD25OH 35.5 01/10/2024     Patient Active Problem List   Diagnosis Date Noted   Rotator cuff syndrome, right 11/08/2023   Fever blister 10/02/2023   Trigger finger, right middle finger 12/11/2021   Microalbuminuria due to type 2 diabetes mellitus (HCC) 12/07/2019   Diabetes mellitus treated with oral medication (HCC) 04/21/2018   Right shoulder pain 03/31/2018   Hyperlipidemia associated with type 2 diabetes mellitus (HCC) 06/12/2017   Tinea  corporis 03/17/2015   Altered bowel function 12/15/2014   Hemiparesis affecting left side as late effect of cerebrovascular accident (CVA) (HCC) 12/15/2014   Obstructive sleep apnea of adult 12/15/2014   Arthritis of shoulder region, 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     Past Surgical History:  Procedure Laterality Date   CESAREAN SECTION  1975   COLONOSCOPY  2010   normal    Social History   Tobacco Use   Smoking status: Never    Passive exposure: Never   Smokeless tobacco: Never   Tobacco comments:    smoking cessation materials not required  Vaping Use   Vaping status: Never Used  Substance Use Topics   Alcohol use: No    Alcohol/week: 0.0 standard drinks of alcohol   Drug use: Never     Medication list has been reviewed and updated.  Current Meds  Medication Sig   ACCU-CHEK GUIDE TEST test strip USE TO CHECK BLOOD GLUCOSE AS  INTRUCTED  Accu-Chek Softclix Lancets lancets USE TO CHECK BLOOD GLUCOSE UP TO 4 TIMES DAILY   acyclovir  cream (ZOVIRAX ) 5 % Apply 1 Application topically every 3 (three) hours. To lip lesion prn   alendronate  (FOSAMAX ) 70 MG tablet TAKE 1 TABLET BY MOUTH WEEKLY  WITH A FULL GLASS OF WATER ON AN EMPTY STOMACH   aspirin 81 MG chewable tablet Chew 1 tablet by mouth daily.   atorvastatin  (LIPITOR) 10 MG tablet Take 1 tablet (10 mg total) by mouth daily.   baclofen  (LIORESAL ) 10 MG tablet Take 1 tablet (10 mg total) by mouth 2 (two) times daily.   blood glucose meter kit and supplies Dispense based on patient and insurance preference. Use up to four times daily as directed. (FOR ICD-10 E10.9, E11.9).   ciclopirox (PENLAC) 8 % solution Apply topically at bedtime.    clotrimazole -betamethasone  (LOTRISONE ) cream Apply 1 Application topically 2 (two) times daily. To groin rash   Continuous Glucose Sensor (DEXCOM G7 SENSOR) MISC 1 each by Does not apply route continuous.   Glucose Blood Automatic (POGO AUTOMATIC TEST CARTRIDGES) TEST 1 each by In Vitro route daily.   lisinopril  (ZESTRIL ) 10 MG tablet Take 1 tablet (10 mg total) by mouth daily.   Melatonin 10 MG TABS Take 1 tablet by mouth at bedtime as needed.   metFORMIN  (GLUCOPHAGE ) 500 MG tablet Take 1 tablet (500 mg total) by mouth daily with breakfast.   Multiple Vitamin (MULTI-VITAMIN) tablet Take 1 tablet by mouth daily.   nystatin  cream (MYCOSTATIN ) Apply 1 Application topically 2 (two) times daily.   triamcinolone  (KENALOG ) 0.025 % ointment Apply 1 Application topically 2 (two) times daily.       03/25/2024    9:14 AM 01/29/2024   11:17 AM 01/10/2024   10:25 AM 12/06/2023    9:10 AM  GAD 7 : Generalized Anxiety Score  Nervous, Anxious, on Edge 0 0 0 0  Control/stop worrying 0 0 0 0  Worry too much - different things 0 0 0 0  Trouble relaxing 0 0 0 0  Restless 0 0 0 0  Easily annoyed or irritable 0 0 0 0  Afraid - awful might happen 0 0 0 0  Total GAD 7 Score 0 0 0 0  Anxiety Difficulty Not difficult at all Not difficult at all Not difficult at all Not difficult at all       03/25/2024    9:14 AM 02/26/2024    2:00 PM 01/29/2024   11:17 AM  Depression screen PHQ 2/9  Decreased Interest 0 0 0  Down, Depressed, Hopeless 0 0 0  PHQ - 2 Score 0 0 0  Altered sleeping 0  0  Tired, decreased energy 0  0  Change in appetite 0  0  Feeling bad or failure about yourself  0  0  Trouble concentrating 0  0  Moving slowly or fidgety/restless 0  0  Suicidal thoughts 0  0  PHQ-9 Score 0  0  Difficult doing work/chores Not difficult at all  Not difficult at all    BP Readings from Last 3 Encounters:  03/25/24 118/74  02/26/24 126/74  01/29/24 128/70    Physical Exam Vitals and nursing note  reviewed.  Constitutional:      General: She is not in acute distress.    Appearance: She is well-developed.  HENT:     Head: Normocephalic and atraumatic.  Cardiovascular:     Rate and Rhythm: Normal rate and regular rhythm.  Pulmonary:  Effort: Pulmonary effort is normal. No respiratory distress.     Breath sounds: No wheezing or rhonchi.  Skin:    General: Skin is warm and dry.     Findings: No rash.         Comments: 2 mm shallow ulcer - no surrounding erythema or tenderness.  Neurological:     Mental Status: She is alert and oriented to person, place, and time.  Psychiatric:        Mood and Affect: Mood normal.        Behavior: Behavior normal.     Wt Readings from Last 3 Encounters:  03/25/24 150 lb (68 kg)  02/26/24 152 lb 6.4 oz (69.1 kg)  01/29/24 152 lb (68.9 kg)    BP 118/74   Pulse 84   Ht 4' 8 (1.422 m)   Wt 150 lb (68 kg)   SpO2 98%   BMI 33.63 kg/m   Assessment and Plan:  Problem List Items Addressed This Visit   None Visit Diagnoses       Open wound of skin    -  Primary   shallow ulcer without infection or inflammation recommend topical TAO bid return if worsening       No follow-ups on file.    Leita HILARIO Adie, MD [facility] [place]

## 2024-03-25 NOTE — Patient Instructions (Signed)
 Monitor skin lesion for worsening Apply antibiotic ointment twice a day

## 2024-03-31 ENCOUNTER — Ambulatory Visit
Admission: RE | Admit: 2024-03-31 | Discharge: 2024-03-31 | Disposition: A | Discharge status: Home / Self Care | Source: Ambulatory Visit | Attending: Internal Medicine | Admitting: Internal Medicine

## 2024-03-31 ENCOUNTER — Ambulatory Visit
Admission: RE | Admit: 2024-03-31 | Discharge: 2024-03-31 | Disposition: A | Source: Ambulatory Visit | Attending: Internal Medicine | Admitting: Internal Medicine

## 2024-03-31 DIAGNOSIS — Z1231 Encounter for screening mammogram for malignant neoplasm of breast: Secondary | ICD-10-CM | POA: Diagnosis present

## 2024-03-31 DIAGNOSIS — M810 Age-related osteoporosis without current pathological fracture: Secondary | ICD-10-CM | POA: Insufficient documentation

## 2024-04-20 ENCOUNTER — Ambulatory Visit (INDEPENDENT_AMBULATORY_CARE_PROVIDER_SITE_OTHER): Admitting: Internal Medicine

## 2024-04-20 ENCOUNTER — Ambulatory Visit: Admitting: Internal Medicine

## 2024-04-20 ENCOUNTER — Encounter: Payer: Self-pay | Admitting: Internal Medicine

## 2024-04-20 VITALS — BP 108/72 | HR 91 | Temp 98.4°F | Ht <= 58 in | Wt 150.0 lb

## 2024-04-20 DIAGNOSIS — J0100 Acute maxillary sinusitis, unspecified: Secondary | ICD-10-CM | POA: Diagnosis not present

## 2024-04-20 MED ORDER — AZITHROMYCIN 250 MG PO TABS: ORAL_TABLET | ORAL | 0 refills | Status: AC

## 2024-04-20 NOTE — Progress Notes (Signed)
 Date:  04/20/2024   Name:  Sharon York   DOB:  1945/05/23   MRN:  [id number]   Chief Complaint: Cough (Cough, and runny nose X 3-5 days. Green production with cough. )  Sinus Problem This is a new problem. The current episode started in the past 7 days. The problem has been gradually worsening since onset. There has been no fever. She is experiencing no pain. Associated symptoms include congestion, coughing and sinus pressure. Pertinent negatives include no chills. Treatments tried: mucinex. The treatment provided mild relief.    Review of Systems  Constitutional:  Negative for chills, fatigue and fever.  HENT:  Positive for congestion and sinus pressure.   Respiratory:  Positive for cough and wheezing. Negative for chest tightness.   Cardiovascular:  Negative for chest pain.  Psychiatric/Behavioral:  Negative for dysphoric mood and sleep disturbance. The patient is not nervous/anxious.      Lab Results  Component Value Date   NA 143 01/10/2024   K 4.4 01/10/2024   CO2 24 01/10/2024   GLUCOSE 83 01/10/2024   BUN 10 01/10/2024   CREATININE 0.61 01/10/2024   CALCIUM  9.5 01/10/2024   EGFR 90 01/10/2024   GFRNONAA 79 12/03/2019   Lab Results  Component Value Date   CHOL 175 01/10/2024   HDL 74 01/10/2024   LDLCALC 82 01/10/2024   TRIG 106 01/10/2024   CHOLHDL 2.4 01/10/2024   Lab Results  Component Value Date   TSH 2.920 01/10/2024   Lab Results  Component Value Date   HGBA1C 7.1 (H) 01/10/2024   Lab Results  Component Value Date   WBC 6.8 01/10/2024   HGB 12.4 01/10/2024   HCT 40.5 01/10/2024   MCV 79 01/10/2024   PLT 270 01/10/2024   Lab Results  Component Value Date   ALT 19 01/10/2024   AST 22 01/10/2024   ALKPHOS 53 01/10/2024   BILITOT <0.2 01/10/2024   Lab Results  Component Value Date   VD25OH 35.5 01/10/2024     Patient Active Problem List   Diagnosis Date Noted   Rotator cuff syndrome, right 11/08/2023   Fever blister 10/02/2023    Trigger finger, right middle finger 12/11/2021   Microalbuminuria due to type 2 diabetes mellitus (HCC) 12/07/2019   Diabetes mellitus treated with oral medication (HCC) 04/21/2018   Right shoulder pain 03/31/2018   Hyperlipidemia associated with type 2 diabetes mellitus (HCC) 06/12/2017   Tinea corporis 03/17/2015   Altered bowel function 12/15/2014   Hemiparesis affecting left side as late effect of cerebrovascular accident (CVA) (HCC) 12/15/2014   Obstructive sleep apnea of adult 12/15/2014   Arthritis of shoulder region, 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     Past Surgical History:  Procedure Laterality Date   CESAREAN SECTION  1975   COLONOSCOPY  2010   normal    Social History   Tobacco Use   Smoking status: Never    Passive exposure: Never   Smokeless tobacco: Never   Tobacco comments:    smoking cessation materials not required  Vaping Use   Vaping status: Never Used  Substance Use Topics   Alcohol use: No    Alcohol/week: 0.0 standard drinks  of alcohol   Drug use: Never     Medication list has been reviewed and updated.  Current Meds  Medication Sig   ACCU-CHEK GUIDE TEST test strip USE TO CHECK BLOOD GLUCOSE AS  INTRUCTED   Accu-Chek Softclix Lancets lancets USE TO CHECK BLOOD GLUCOSE UP TO 4 TIMES DAILY   acyclovir  cream (ZOVIRAX ) 5 % Apply 1 Application topically every 3 (three) hours. To lip lesion prn   alendronate  (FOSAMAX ) 70 MG tablet TAKE 1 TABLET BY MOUTH WEEKLY  WITH A FULL GLASS OF WATER ON AN EMPTY STOMACH   aspirin 81 MG chewable tablet Chew 1 tablet by mouth daily.   atorvastatin  (LIPITOR) 10 MG tablet Take 1 tablet (10 mg total) by mouth daily.    azithromycin  (ZITHROMAX  Z-PAK) 250 MG tablet UAD   baclofen  (LIORESAL ) 10 MG tablet Take 1 tablet (10 mg total) by mouth 2 (two) times daily.   blood glucose meter kit and supplies Dispense based on patient and insurance preference. Use up to four times daily as directed. (FOR ICD-10 E10.9, E11.9).   ciclopirox (PENLAC) 8 % solution Apply topically at bedtime.   clotrimazole -betamethasone  (LOTRISONE ) cream Apply 1 Application topically 2 (two) times daily. To groin rash   Continuous Glucose Sensor (DEXCOM G7 SENSOR) MISC 1 each by Does not apply route continuous.   Glucose Blood Automatic (POGO AUTOMATIC TEST CARTRIDGES) TEST 1 each by In Vitro route daily.   lisinopril  (ZESTRIL ) 10 MG tablet Take 1 tablet (10 mg total) by mouth daily.   Melatonin 10 MG TABS Take 1 tablet by mouth at bedtime as needed.   metFORMIN  (GLUCOPHAGE ) 500 MG tablet Take 1 tablet (500 mg total) by mouth daily with breakfast.   Multiple Vitamin (MULTI-VITAMIN) tablet Take 1 tablet by mouth daily.   nystatin  cream (MYCOSTATIN ) Apply 1 Application topically 2 (two) times daily.   triamcinolone  (KENALOG ) 0.025 % ointment Apply 1 Application topically 2 (two) times daily.       04/20/2024    3:21 PM 03/25/2024    9:14 AM 01/29/2024   11:17 AM 01/10/2024   10:25 AM  GAD 7 : Generalized Anxiety Score  Nervous, Anxious, on Edge 0 0 0 0  Control/stop worrying 0 0 0 0  Worry too much - different things 0 0 0 0  Trouble relaxing 0 0 0 0  Restless 0 0 0 0  Easily annoyed or irritable 0 0 0 0  Afraid - awful might happen 0 0 0 0  Total GAD 7 Score 0 0 0 0  Anxiety Difficulty Not difficult at all Not difficult at all Not difficult at all Not difficult at all       04/20/2024    3:21 PM 03/25/2024    9:14 AM 02/26/2024    2:00 PM  Depression screen PHQ 2/9  Decreased Interest 0 0 0  Down, Depressed, Hopeless 0 0 0  PHQ - 2 Score 0 0 0  Altered sleeping 0 0   Tired, decreased energy 0 0   Change in appetite 0 0    Feeling bad or failure about yourself  0 0   Trouble concentrating 0 0   Moving slowly or fidgety/restless 0 0   Suicidal thoughts 0 0   PHQ-9 Score 0 0    Difficult doing work/chores Not difficult at all Not difficult at all      Data saved with a previous flowsheet row definition    BP Readings from Last 3 Encounters:  04/20/24  108/72  03/25/24 118/74  02/26/24 126/74    Physical Exam Constitutional:      Appearance: Normal appearance. She is well-developed.  HENT:     Right Ear: Ear canal and external ear normal.     Left Ear: Ear canal and external ear normal.     Nose:     Right Sinus: Maxillary sinus tenderness present. No frontal sinus tenderness.     Left Sinus: Maxillary sinus tenderness present. No frontal sinus tenderness.     Mouth/Throat:     Mouth: No oral lesions.     Pharynx: Uvula midline.  Cardiovascular:     Rate and Rhythm: Normal rate and regular rhythm.     Heart sounds: Normal heart sounds.  Pulmonary:     Breath sounds: Examination of the right-upper field reveals wheezing. Examination of the left-upper field reveals wheezing. Wheezing present. No rhonchi or rales.  Lymphadenopathy:     Cervical: No cervical adenopathy.  Neurological:     Mental Status: She is alert and oriented to person, place, and time.     Wt Readings from Last 3 Encounters:  04/20/24 150 lb (68 kg)  03/25/24 150 lb (68 kg)  02/26/24 152 lb 6.4 oz (69.1 kg)    BP 108/72   Pulse 91   Temp 98.4 F (36.9 C) (Oral)   Ht 4' 8 (1.422 m)   Wt 150 lb (68 kg)   SpO2 96%   BMI 33.63 kg/m   Assessment and Plan:  Problem List Items Addressed This Visit   None Visit Diagnoses       Acute non-recurrent maxillary sinusitis    -  Primary   continue Mucinex bid; push fluids take Zpak - follow up if needed   Relevant Medications   azithromycin  (ZITHROMAX  Z-PAK) 250 MG tablet       No follow-ups on file.    Leita HILARIO Adie, MD [facility] [place]

## 2024-05-04 ENCOUNTER — Telehealth: Payer: Self-pay

## 2024-05-04 ENCOUNTER — Ambulatory Visit: Admitting: Internal Medicine

## 2024-05-04 ENCOUNTER — Encounter: Payer: Self-pay | Admitting: Internal Medicine

## 2024-05-04 VITALS — BP 138/72 | HR 93 | Ht <= 58 in | Wt 147.0 lb

## 2024-05-04 DIAGNOSIS — E119 Type 2 diabetes mellitus without complications: Secondary | ICD-10-CM

## 2024-05-04 DIAGNOSIS — Z7984 Long term (current) use of oral hypoglycemic drugs: Secondary | ICD-10-CM

## 2024-05-04 DIAGNOSIS — L299 Pruritus, unspecified: Secondary | ICD-10-CM | POA: Insufficient documentation

## 2024-05-04 LAB — POCT GLYCOSYLATED HEMOGLOBIN (HGB A1C): Hemoglobin A1C: 6.3 % — AB (ref 4.0–5.6)

## 2024-05-04 MED ORDER — DIPHENHYDRAMINE HCL 25 MG PO CAPS: 25.0000 mg | ORAL_CAPSULE | Freq: Every day | ORAL | 0 refills | Status: AC

## 2024-05-04 MED ORDER — LORATADINE 10 MG PO TABS: 10.0000 mg | ORAL_TABLET | ORAL | 0 refills | Status: AC

## 2024-05-04 NOTE — Telephone Encounter (Unsigned)
 Copied from CRM #[id number]. Topic: Clinical - Medication Question >> May 04, 2024  4:37 PM Joesph B wrote: Reason for CRM: patients daughter wants to know why she is prescribed Claritin  and benadryl . Advised her of visit notes but still wants to speak to a nurse.

## 2024-05-04 NOTE — Assessment & Plan Note (Signed)
 Is using topical creams as needed. Most of the itching is in the groin area. No rash on exam Recommend Claritin /loratidine in the AM and Benadryl  in the PM

## 2024-05-04 NOTE — Assessment & Plan Note (Addendum)
 Currently medications are MTF.  No hypoglycemic episodes noted. Home blood sugars in the 120 range. Last visit medical regimen changes were none. Lab Results  Component Value Date   HGBA1C 7.1 (H) 01/10/2024  A1C today =  6.3.  much improved.  Continue current medications.

## 2024-05-04 NOTE — Progress Notes (Signed)
 Date:  05/04/2024   Name:  Sharon York   DOB:  1946/02/20   MRN:  [id number]   Chief Complaint: Diabetes and Itching (Whole body; denies rash; comes and goes )  Diabetes She presents for her follow-up diabetic visit. She has type 2 diabetes mellitus. Her disease course has been stable. Pertinent negatives for hypoglycemia include no dizziness, headaches or nervousness/anxiousness. Associated symptoms include weakness. Pertinent negatives for diabetes include no chest pain and no fatigue. Current diabetic treatment includes oral agent (monotherapy) (MTF).  Itching all over - no rash but itching all over.  Not taking Benadryl  or zyrtec/claritin .  Review of Systems  Constitutional:  Negative for fatigue and unexpected weight change.  HENT:  Negative for trouble swallowing.   Eyes:  Negative for visual disturbance.  Respiratory:  Negative for cough, chest tightness, shortness of breath and wheezing.   Cardiovascular:  Negative for chest pain, palpitations and leg swelling.  Gastrointestinal:  Negative for abdominal pain, constipation and diarrhea.  Musculoskeletal:  Positive for gait problem. Negative for arthralgias and myalgias.  Neurological:  Positive for weakness. Negative for dizziness, light-headedness and headaches.  Psychiatric/Behavioral:  Negative for sleep disturbance. The patient is not nervous/anxious.      Lab Results  Component Value Date   NA 143 01/10/2024   K 4.4 01/10/2024   CO2 24 01/10/2024   GLUCOSE 83 01/10/2024   BUN 10 01/10/2024   CREATININE 0.61 01/10/2024   CALCIUM  9.5 01/10/2024   EGFR 90 01/10/2024   GFRNONAA 79 12/03/2019   Lab Results  Component Value Date   CHOL 175 01/10/2024   HDL 74 01/10/2024   LDLCALC 82 01/10/2024   TRIG 106 01/10/2024   CHOLHDL 2.4 01/10/2024   Lab Results  Component Value Date   TSH 2.920 01/10/2024   Lab Results  Component Value Date   HGBA1C 6.3 (A) 05/04/2024   Lab Results  Component Value Date    WBC 6.8 01/10/2024   HGB 12.4 01/10/2024   HCT 40.5 01/10/2024   MCV 79 01/10/2024   PLT 270 01/10/2024   Lab Results  Component Value Date   ALT 19 01/10/2024   AST 22 01/10/2024   ALKPHOS 53 01/10/2024   BILITOT <0.2 01/10/2024   Lab Results  Component Value Date   VD25OH 35.5 01/10/2024     Patient Active Problem List   Diagnosis Date Noted   Pruritic condition 05/04/2024   Rotator cuff syndrome, right 11/08/2023   Fever blister 10/02/2023   Trigger finger, right middle finger 12/11/2021   Microalbuminuria due to type 2 diabetes mellitus (HCC) 12/07/2019   Diabetes mellitus treated with oral medication (HCC) 04/21/2018   Right shoulder pain 03/31/2018   Hyperlipidemia associated with type 2 diabetes mellitus (HCC) 06/12/2017   Tinea corporis 03/17/2015   Altered bowel function 12/15/2014   Hemiparesis affecting left side as late effect of cerebrovascular accident (CVA) (HCC) 12/15/2014   Obstructive sleep apnea of adult 12/15/2014   Arthritis of shoulder region, degenerative 12/15/2014   OP (osteoporosis) 12/15/2014   Allergic rhinitis, seasonal 12/15/2014   Mixed incontinence 03/11/2013    Allergies[1]  Past Surgical History:  Procedure Laterality Date   CESAREAN SECTION  1975   COLONOSCOPY  2010   normal    Social History[2]   Medication list has been reviewed and updated.  Active Medications[3]     04/20/2024    3:21 PM 03/25/2024    9:14 AM 01/29/2024   11:17 AM 01/10/2024  10:25 AM  GAD 7 : Generalized Anxiety Score  Nervous, Anxious, on Edge 0 0 0 0  Control/stop worrying 0 0 0 0  Worry too much - different things 0 0 0 0  Trouble relaxing 0 0 0 0  Restless 0 0 0 0  Easily annoyed or irritable 0 0 0 0  Afraid - awful might happen 0 0 0 0  Total GAD 7 Score 0 0 0 0  Anxiety Difficulty Not difficult at all Not difficult at all Not difficult at all Not difficult at all       04/20/2024    3:21 PM 03/25/2024    9:14 AM 02/26/2024    2:00 PM   Depression screen PHQ 2/9  Decreased Interest 0 0 0  Down, Depressed, Hopeless 0 0 0  PHQ - 2 Score 0 0 0  Altered sleeping 0 0   Tired, decreased energy 0 0   Change in appetite 0 0   Feeling bad or failure about yourself  0 0   Trouble concentrating 0 0   Moving slowly or fidgety/restless 0 0   Suicidal thoughts 0 0   PHQ-9 Score 0 0    Difficult doing work/chores Not difficult at all Not difficult at all      Data saved with a previous flowsheet row definition    BP Readings from Last 3 Encounters:  05/04/24 138/72  04/20/24 108/72  03/25/24 118/74    Physical Exam Vitals and nursing note reviewed.  Constitutional:      General: She is not in acute distress.    Appearance: She is well-developed.  HENT:     Head: Normocephalic and atraumatic.  Cardiovascular:     Rate and Rhythm: Normal rate and regular rhythm.  Pulmonary:     Effort: Pulmonary effort is normal. No respiratory distress.     Breath sounds: No wheezing or rhonchi.  Musculoskeletal:     Cervical back: Normal range of motion.     Right lower leg: No edema.     Left lower leg: No edema.  Skin:    General: Skin is warm and dry.     Findings: Lesion (AK on right shoulder) present. No erythema or rash.  Neurological:     Mental Status: She is alert and oriented to person, place, and time. Mental status is at baseline.  Psychiatric:        Mood and Affect: Mood normal.        Behavior: Behavior normal.     Wt Readings from Last 3 Encounters:  05/04/24 147 lb (66.7 kg)  04/20/24 150 lb (68 kg)  03/25/24 150 lb (68 kg)    BP 138/72   Pulse 93   Ht 4' 8 (1.422 m)   Wt 147 lb (66.7 kg)   SpO2 97%   BMI 32.96 kg/m   Assessment and Plan:  Problem List Items Addressed This Visit       Unprioritized   Diabetes mellitus treated with oral medication (HCC) - Primary (Chronic)   Currently medications are MTF.  No hypoglycemic episodes noted. Home blood sugars in the 120 range. Last visit  medical regimen changes were none. Lab Results  Component Value Date   HGBA1C 7.1 (H) 01/10/2024  A1C today =  6.3.  much improved.  Continue current medications.        Relevant Orders   POCT glycosylated hemoglobin (Hb A1C) (Completed)   Pruritic condition   Is using topical creams as  needed. Most of the itching is in the groin area. No rash on exam Recommend Claritin /loratidine in the AM and Benadryl  in the PM      Relevant Medications   loratadine  (CLARITIN ) 10 MG tablet   diphenhydrAMINE  (BENADRYL ) 25 mg capsule   Other Visit Diagnoses       Long term current use of oral hypoglycemic drug           Return in about 4 months (around 09/02/2024) for HTN, DM.    Leita HILARIO Adie, MD [facility] [place]           [1]  Allergies Allergen Reactions   Nitrofurantoin  Nausea Only   Iodinated Contrast Media Hives and Cough    Patient developed a hive on her left lip after injection as well as some coughing.   [2]  Social History Tobacco Use   Smoking status: Never    Passive exposure: Never   Smokeless tobacco: Never   Tobacco comments:    smoking cessation materials not required  Vaping Use   Vaping status: Never Used  Substance Use Topics   Alcohol use: No    Alcohol/week: 0.0 standard drinks of alcohol   Drug use: Never  [3]  Current Meds  Medication Sig   ACCU-CHEK GUIDE TEST test strip USE TO CHECK BLOOD GLUCOSE AS  INTRUCTED   Accu-Chek Softclix Lancets lancets USE TO CHECK BLOOD GLUCOSE UP TO 4 TIMES DAILY   acyclovir  cream (ZOVIRAX ) 5 % Apply 1 Application topically every 3 (three) hours. To lip lesion prn   alendronate  (FOSAMAX ) 70 MG tablet TAKE 1 TABLET BY MOUTH WEEKLY  WITH A FULL GLASS OF WATER ON AN EMPTY STOMACH   aspirin 81 MG chewable tablet Chew 1 tablet by mouth daily.   atorvastatin  (LIPITOR) 10 MG tablet Take 1 tablet (10 mg total) by mouth daily.   baclofen  (LIORESAL ) 10 MG tablet Take 1 tablet (10  mg total) by mouth 2 (two) times daily.   blood glucose meter kit and supplies Dispense based on patient and insurance preference. Use up to four times daily as directed. (FOR ICD-10 E10.9, E11.9).   ciclopirox (PENLAC) 8 % solution Apply topically at bedtime.   clotrimazole -betamethasone  (LOTRISONE ) cream Apply 1 Application topically 2 (two) times daily. To groin rash   Continuous Glucose Sensor (DEXCOM G7 SENSOR) MISC 1 each by Does not apply route continuous.   diphenhydrAMINE  (BENADRYL ) 25 mg capsule Take 1 capsule (25 mg total) by mouth at bedtime.   Glucose Blood Automatic (POGO AUTOMATIC TEST CARTRIDGES) TEST 1 each by In Vitro route daily.   lisinopril  (ZESTRIL ) 10 MG tablet Take 1 tablet (10 mg total) by mouth daily.   loratadine  (CLARITIN ) 10 MG tablet Take 1 tablet (10 mg total) by mouth every morning.   Melatonin 10 MG TABS Take 1 tablet by mouth at bedtime as needed.   metFORMIN  (GLUCOPHAGE ) 500 MG tablet Take 1 tablet (500 mg total) by mouth daily with breakfast.   Multiple Vitamin (MULTI-VITAMIN) tablet Take 1 tablet by mouth daily.   nystatin  cream (MYCOSTATIN ) Apply 1 Application topically 2 (two) times daily.   triamcinolone  (KENALOG ) 0.025 % ointment Apply 1 Application topically 2 (two) times daily.

## 2024-05-05 NOTE — Telephone Encounter (Signed)
 FYI- Spoke with Bari patients daughter and she understood why medications were given after I informed heer of message above. She was wondering why patient was not given a cream or ointment for bump on gluteal area. I advise her Ms. Gavin did not mention at her visit. Daughter will ask Ms. Gordin how she slept last night to see if medications helped her itching. If not she will schedule another appt for her to be seen.  JM

## 2024-05-05 NOTE — Telephone Encounter (Signed)
 Please review and advise.  JM

## 2024-05-12 ENCOUNTER — Ambulatory Visit: Admitting: Internal Medicine

## 2024-05-20 ENCOUNTER — Other Ambulatory Visit: Payer: Self-pay | Admitting: Internal Medicine

## 2024-05-20 DIAGNOSIS — M810 Age-related osteoporosis without current pathological fracture: Secondary | ICD-10-CM

## 2024-05-20 DIAGNOSIS — E118 Type 2 diabetes mellitus with unspecified complications: Secondary | ICD-10-CM

## 2024-05-22 NOTE — Telephone Encounter (Signed)
 Requested Prescriptions  Pending Prescriptions Disp Refills   Accu-Chek Softclix Lancets lancets [Pharmacy Med Name: Accu-Chek Softclix Lancets] 400 each 1    Sig: USE TO CHECK BLOOD GLUCOSE UP TO 4 TIMES DAILY     Endocrinology: Diabetes - Testing Supplies Passed - 05/22/2024 11:23 AM      Passed - Valid encounter within last 12 months    Recent Outpatient Visits           2 weeks ago Diabetes mellitus treated with oral medication [facility])   [place] [facility] at [facility], Leita DEL, MD   1 month ago Acute non-recurrent maxillary sinusitis   [place] [facility] at [facility], Leita DEL, MD   1 month ago Open wound of skin   [place] [facility] at [facility], Leita DEL, MD   2 months ago Hemiparesis affecting left side as late effect of cerebrovascular accident (CVA) [facility])   [place] [facility] at [facility], Leita DEL, MD   3 months ago COVID-19 virus infection   [place] [facility] at [facility], Leita DEL, MD               alendronate  (FOSAMAX ) 70 MG tablet [Pharmacy Med Name: Alendronate  Sodium 70 MG Oral Tablet] 12 tablet 1    Sig: TAKE 1 TABLET BY MOUTH WEEKLY  WITH A FULL GLASS OF WATER ON AN EMPTY STOMACH     Endocrinology:  Bisphosphonates Failed - 05/22/2024 11:23 AM      Failed - Mg Level in normal range and within 360 days    No results found for: MG       Failed - Phosphate in normal range and within 360 days    No results found for: PHOS       Passed - Ca in normal range and within 360 days    Calcium   Date Value Ref Range Status  01/10/2024 9.5 8.7 - 10.3 mg/dL Final         Passed - Vitamin D  in normal range and within 360 days    Vit D, 25-Hydroxy  Date Value Ref Range Status  01/10/2024 35.5 30.0 - 100.0 ng/mL Final    Comment:    Vitamin D  deficiency has been defined by  the Institute of Medicine and an Endocrine Society practice guideline as a level of serum 25-OH vitamin D  less than 20 ng/mL (1,2). The Endocrine Society went on to further define vitamin D  insufficiency as a level between 21 and 29 ng/mL (2). 1. IOM (Institute of Medicine). 2010. Dietary reference    intakes for calcium  and D. [place]: The    [employer]. 2. Holick MF, Binkley , Bischoff-Ferrari HA, et al.    Evaluation, treatment, and prevention of vitamin D     deficiency: an Endocrine Society clinical practice    guideline. JCEM. 2011 Jul; 96(7):1911-30.          Passed - Cr in normal range and within 360 days    Creatinine, Ser  Date Value Ref Range Status  01/10/2024 0.61 0.57 - 1.00 mg/dL Final         Passed - eGFR is 30 or above and within 360 days    GFR calc Af Amer  Date Value Ref Range Status  12/03/2019 91 >59 mL/min/1.73 Final    Comment:    **[facility]  currently reports eGFR in compliance with the current**   recommendations of the [employer]. [facility] will   update reporting as new guidelines are published from the NKF-ASN   Task force.    GFR calc non Af Amer  Date Value Ref Range Status  12/03/2019 79 >59 mL/min/1.73 Final   eGFR  Date Value Ref Range Status  01/10/2024 90 >59 mL/min/1.73 Final         Passed - Valid encounter within last 12 months    Recent Outpatient Visits           2 weeks ago Diabetes mellitus treated with oral medication [facility])   [place] [facility] at [facility], Leita DEL, MD   1 month ago Acute non-recurrent maxillary sinusitis   [place] [facility] at [facility] Leita DEL, MD   1 month ago Open wound of skin   [place] [facility] at [facility], Leita DEL, MD   2 months ago Hemiparesis affecting left side as late effect of cerebrovascular accident (CVA) [facility])   [place] [facility] at [facility], Leita DEL, MD   3 months ago COVID-19 virus infection   [facility] at [facility], Leita DEL, MD              Passed - Bone Mineral Density or Dexa Scan completed in the last 2 years

## 2024-05-25 DIAGNOSIS — E1169 Type 2 diabetes mellitus with other specified complication: Secondary | ICD-10-CM

## 2024-05-26 NOTE — Telephone Encounter (Signed)
 Requested Prescriptions  Refused Prescriptions Disp Refills   atorvastatin  (LIPITOR) 10 MG tablet [Pharmacy Med Name: Atorvastatin  Calcium  10 MG Oral Tablet] 100 tablet 2    Sig: TAKE 1 TABLET BY MOUTH DAILY     Cardiovascular:  Antilipid - Statins Failed - 05/26/2024  4:58 PM      Failed - Lipid Panel in normal range within the last 12 months    Cholesterol, Total  Date Value Ref Range Status  01/10/2024 175 100 - 199 mg/dL Final   LDL Chol Calc (NIH)  Date Value Ref Range Status  01/10/2024 82 0 - 99 mg/dL Final   HDL  Date Value Ref Range Status  01/10/2024 74 >39 mg/dL Final   Triglycerides  Date Value Ref Range Status  01/10/2024 106 0 - 149 mg/dL Final         Passed - Patient is not pregnant      Passed - Valid encounter within last 12 months    Recent Outpatient Visits           3 weeks ago Diabetes mellitus treated with oral medication (HCC)   [place] [facility] at [facility], Leita DEL, MD   1 month ago Acute non-recurrent maxillary sinusitis   [place] [facility] at [facility], Leita DEL, MD   2 months ago Open wound of skin   [place] [facility] at [facility], Leita DEL, MD   3 months ago Hemiparesis affecting left side as late effect of cerebrovascular accident (CVA) [facility])   [place] [facility] at [facility], Leita DEL, MD   3 months ago COVID-19 virus infection   [facility] at [facility], Leita DEL, MD

## 2024-05-27 ENCOUNTER — Other Ambulatory Visit: Payer: Self-pay | Admitting: Student

## 2024-05-27 DIAGNOSIS — E1169 Type 2 diabetes mellitus with other specified complication: Secondary | ICD-10-CM

## 2024-05-27 NOTE — Telephone Encounter (Signed)
 Copied from CRM [id number]. Topic: Clinical - Medication Refill >> May 27, 2024  8:12 AM Aleatha C wrote: Medication:  atorvastatin  (LIPITOR) 10 MG tablet   Has the patient contacted their pharmacy? Yes (Agent: If no, request that the patient contact the pharmacy for the refill. If patient does not wish to contact the pharmacy document the reason why and proceed with request.) (Agent: If yes, when and what did the pharmacy advise?)  This is the patient's preferred pharmacy:   [facility] [place], [place] - [address] [place] [address] Phone: [phone] Fax: [phone]  Is this the correct pharmacy for this prescription? Yes If no, delete pharmacy and type the correct one.   Has the prescription been filled recently? No  Is the patient out of the medication? No  Has the patient been seen for an appointment in the last year OR does the patient have an upcoming appointment? Yes  Can we respond through MyChart? No  Agent: Please be advised that Rx refills may take up to 3 business days. We ask that you follow-up with your pharmacy.

## 2024-05-28 MED ORDER — ATORVASTATIN CALCIUM 10 MG PO TABS: 10.0000 mg | ORAL_TABLET | Freq: Every day | ORAL | 1 refills | Status: AC

## 2024-05-28 NOTE — Telephone Encounter (Signed)
 Requested Prescriptions  Pending Prescriptions Disp Refills   atorvastatin  (LIPITOR) 10 MG tablet 100 tablet 1    Sig: Take 1 tablet (10 mg total) by mouth daily.     Cardiovascular:  Antilipid - Statins Failed - 05/28/2024 12:08 PM      Failed - Lipid Panel in normal range within the last 12 months    Cholesterol, Total  Date Value Ref Range Status  01/10/2024 175 100 - 199 mg/dL Final   LDL Chol Calc (NIH)  Date Value Ref Range Status  01/10/2024 82 0 - 99 mg/dL Final   HDL  Date Value Ref Range Status  01/10/2024 74 >39 mg/dL Final   Triglycerides  Date Value Ref Range Status  01/10/2024 106 0 - 149 mg/dL Final         Passed - Patient is not pregnant      Passed - Valid encounter within last 12 months    Recent Outpatient Visits           3 weeks ago Diabetes mellitus treated with oral medication (HCC)   [place] [facility] at [facility], Leita DEL, MD   1 month ago Acute non-recurrent maxillary sinusitis   [place] [facility] at [facility], Leita DEL, MD   2 months ago Open wound of skin   [place] [facility] at [facility], Leita DEL, MD   3 months ago Hemiparesis affecting left side as late effect of cerebrovascular accident (CVA) [facility])   [place] [facility] at [facility], Leita DEL, MD   4 months ago COVID-19 virus infection   [facility] at [facility], Leita DEL, MD

## 2024-08-17 ENCOUNTER — Encounter: Admitting: Student

## 2024-08-20 ENCOUNTER — Ambulatory Visit

## 2024-09-03 ENCOUNTER — Ambulatory Visit: Admitting: Student
